# Patient Record
Sex: Female | Born: 1950 | Race: Black or African American | Hispanic: No | Marital: Married | Smoking: Former smoker
Health system: Southern US, Community
[De-identification: ages and names within clinical notes are randomized; demographics above are authoritative.]

## PROBLEM LIST (undated history)

## (undated) DIAGNOSIS — C50919 Malignant neoplasm of unspecified site of unspecified female breast: Secondary | ICD-10-CM

## (undated) DIAGNOSIS — J449 Chronic obstructive pulmonary disease, unspecified: Secondary | ICD-10-CM

## (undated) DIAGNOSIS — S46009A Unspecified injury of muscle(s) and tendon(s) of the rotator cuff of unspecified shoulder, initial encounter: Secondary | ICD-10-CM

## (undated) DIAGNOSIS — G43909 Migraine, unspecified, not intractable, without status migrainosus: Secondary | ICD-10-CM

## (undated) DIAGNOSIS — R600 Localized edema: Secondary | ICD-10-CM

## (undated) DIAGNOSIS — I10 Essential (primary) hypertension: Secondary | ICD-10-CM

## (undated) DIAGNOSIS — M1990 Unspecified osteoarthritis, unspecified site: Secondary | ICD-10-CM

## (undated) DIAGNOSIS — E785 Hyperlipidemia, unspecified: Secondary | ICD-10-CM

## (undated) DIAGNOSIS — G2581 Restless legs syndrome: Secondary | ICD-10-CM

## (undated) DIAGNOSIS — D649 Anemia, unspecified: Secondary | ICD-10-CM

## (undated) DIAGNOSIS — R0600 Dyspnea, unspecified: Secondary | ICD-10-CM

## (undated) HISTORY — PX: ABDOMINAL HYSTERECTOMY: SHX81

## (undated) HISTORY — PX: PARTIAL HYSTERECTOMY: SHX80

## (undated) HISTORY — PX: BREAST LUMPECTOMY: SHX2

---

## 1988-06-05 DIAGNOSIS — C50919 Malignant neoplasm of unspecified site of unspecified female breast: Secondary | ICD-10-CM

## 1988-06-05 DIAGNOSIS — Z923 Personal history of irradiation: Secondary | ICD-10-CM

## 1988-06-05 HISTORY — DX: Personal history of irradiation: Z92.3

## 1988-06-05 HISTORY — PX: BREAST BIOPSY: SHX20

## 1988-06-05 HISTORY — PX: MEDIAL PARTIAL KNEE REPLACEMENT: SHX5965

## 1988-06-05 HISTORY — DX: Malignant neoplasm of unspecified site of unspecified female breast: C50.919

## 2004-04-26 ENCOUNTER — Emergency Department: Payer: Self-pay | Admitting: Emergency Medicine

## 2006-10-22 ENCOUNTER — Emergency Department: Payer: Self-pay | Admitting: Emergency Medicine

## 2009-04-04 ENCOUNTER — Emergency Department: Payer: Self-pay | Admitting: Emergency Medicine

## 2009-04-07 ENCOUNTER — Emergency Department: Payer: Self-pay | Admitting: Emergency Medicine

## 2013-07-14 ENCOUNTER — Ambulatory Visit: Payer: Self-pay | Admitting: Gastroenterology

## 2013-07-15 LAB — PATHOLOGY REPORT

## 2013-08-01 ENCOUNTER — Emergency Department: Payer: Self-pay | Admitting: Emergency Medicine

## 2013-09-14 ENCOUNTER — Emergency Department: Payer: Self-pay | Admitting: Emergency Medicine

## 2013-10-21 ENCOUNTER — Ambulatory Visit: Payer: Self-pay | Admitting: Family Medicine

## 2015-02-03 ENCOUNTER — Emergency Department
Admission: EM | Admit: 2015-02-03 | Discharge: 2015-02-03 | Disposition: A | Discharge status: Home / Self Care | Payer: PPO | Attending: Emergency Medicine | Admitting: Emergency Medicine

## 2015-02-03 ENCOUNTER — Encounter: Payer: Self-pay | Admitting: Emergency Medicine

## 2015-02-03 ENCOUNTER — Emergency Department: Payer: PPO

## 2015-02-03 DIAGNOSIS — R519 Headache, unspecified: Secondary | ICD-10-CM

## 2015-02-03 DIAGNOSIS — Z720 Tobacco use: Secondary | ICD-10-CM | POA: Insufficient documentation

## 2015-02-03 DIAGNOSIS — H538 Other visual disturbances: Secondary | ICD-10-CM | POA: Diagnosis not present

## 2015-02-03 DIAGNOSIS — R51 Headache: Secondary | ICD-10-CM | POA: Diagnosis present

## 2015-02-03 DIAGNOSIS — Z91040 Latex allergy status: Secondary | ICD-10-CM | POA: Insufficient documentation

## 2015-02-03 DIAGNOSIS — I10 Essential (primary) hypertension: Secondary | ICD-10-CM | POA: Diagnosis not present

## 2015-02-03 HISTORY — DX: Migraine, unspecified, not intractable, without status migrainosus: G43.909

## 2015-02-03 HISTORY — DX: Essential (primary) hypertension: I10

## 2015-02-03 HISTORY — DX: Malignant neoplasm of unspecified site of unspecified female breast: C50.919

## 2015-02-03 HISTORY — DX: Chronic obstructive pulmonary disease, unspecified: J44.9

## 2015-02-03 LAB — COMPREHENSIVE METABOLIC PANEL
ALBUMIN: 3.6 g/dL (ref 3.5–5.0)
ALT: 13 U/L — ABNORMAL LOW (ref 14–54)
AST: 20 U/L (ref 15–41)
Alkaline Phosphatase: 33 U/L — ABNORMAL LOW (ref 38–126)
Anion gap: 6 (ref 5–15)
BUN: 20 mg/dL (ref 6–20)
CHLORIDE: 108 mmol/L (ref 101–111)
CO2: 27 mmol/L (ref 22–32)
Calcium: 8.9 mg/dL (ref 8.9–10.3)
Creatinine, Ser: 0.9 mg/dL (ref 0.44–1.00)
GFR calc Af Amer: >60 mL/min (ref >60)
GLUCOSE: 95 mg/dL (ref 65–99)
POTASSIUM: 3.7 mmol/L (ref 3.5–5.1)
SODIUM: 141 mmol/L (ref 135–145)
Total Bilirubin: 0.2 mg/dL — ABNORMAL LOW (ref 0.3–1.2)
Total Protein: 6 g/dL — ABNORMAL LOW (ref 6.5–8.1)

## 2015-02-03 LAB — CBC
HCT: 36.4 % (ref 35.0–47.0)
Hemoglobin: 11.7 g/dL — ABNORMAL LOW (ref 12.0–16.0)
MCH: 26.4 pg (ref 26.0–34.0)
MCHC: 32 g/dL (ref 32.0–36.0)
MCV: 82.5 fL (ref 80.0–100.0)
PLATELETS: 252 10*3/uL (ref 150–440)
RBC: 4.41 MIL/uL (ref 3.80–5.20)
RDW: 15.4 % — AB (ref 11.5–14.5)
WBC: 4.9 10*3/uL (ref 3.6–11.0)

## 2015-02-03 LAB — TROPONIN I

## 2015-02-03 MED ORDER — OXYCODONE-ACETAMINOPHEN 5-325 MG PO TABS: 1.0000 | ORAL_TABLET | ORAL | Status: DC | PRN

## 2015-02-03 MED ORDER — ONDANSETRON HCL 4 MG/2ML IJ SOLN
4.0000 mg | Freq: Once | INTRAMUSCULAR | Status: AC
  Administered 2015-02-03: 4 mg via INTRAVENOUS
  Filled 2015-02-03: qty 2

## 2015-02-03 MED ORDER — IOHEXOL 350 MG/ML SOLN
100.0000 mL | Freq: Once | INTRAVENOUS | Status: AC | PRN
  Administered 2015-02-03: 100 mL via INTRAVENOUS

## 2015-02-03 MED ORDER — KETOROLAC TROMETHAMINE 30 MG/ML IJ SOLN
30.0000 mg | Freq: Once | INTRAMUSCULAR | Status: AC
  Administered 2015-02-03: 30 mg via INTRAVENOUS
  Filled 2015-02-03: qty 1

## 2015-02-03 NOTE — ED Notes (Signed)
Patient transported to CT 

## 2015-02-03 NOTE — ED Notes (Signed)

## 2015-02-03 NOTE — ED Provider Notes (Signed)
[facility] Emergency Department Provider Note  ____________________________________________  Time seen: 3:30 AM  I have reviewed the triage vital signs and the nursing notes.   HISTORY  Chief Complaint Migraine      HPI Hailey Farrell is a 64 y.o. female presents with a right-sided headache 3 days current pain score 10 out of 10. Patient states she took tramadol and BC powders at home with slight relief but not complete relief. Patient admits to previous episodes of the same. Patient admits to photosensitivity dizziness with standing. Patient noted to be markedly hypertensive by EMS with a blood pressure 210/109.     Past Medical History  Diagnosis Date  . COPD (chronic obstructive pulmonary disease)   . Hypertension   . Migraines   . Breast cancer     There are no active problems to display for this patient.   Past Surgical History  Procedure Laterality Date  . Partial hysterectomy    . Medial partial knee replacement      No current outpatient prescriptions on file.  Allergies Latex  No family history on file.  Social History Social History  Substance Use Topics  . Smoking status: Current Some Day Smoker -- 0.00 packs/day    Types: Cigarettes  . Smokeless tobacco: None  . Alcohol Use: No    Review of Systems  Constitutional: Negative for fever. Eyes: Negative for visual changes. ENT: Negative for sore throat. Cardiovascular: Negative for chest pain. Respiratory: Negative for shortness of breath. Gastrointestinal: Negative for abdominal pain, vomiting and diarrhea. Genitourinary: Negative for dysuria. Musculoskeletal: Negative for back pain. Skin: Negative for rash. Neurological: Positive for headaches, negative for focal weakness or numbness.   10-point ROS otherwise negative.  ____________________________________________   PHYSICAL EXAM:  VITAL SIGNS: ED Triage Vitals  Enc Vitals Group     BP 02/03/15 0315  161/85 mmHg     Pulse Rate 02/03/15 0315 63     Resp 02/03/15 0315 18     Temp 02/03/15 0315 98.2 F (36.8 C)     Temp Source 02/03/15 0315 Oral     SpO2 02/03/15 0315 100 %     Weight 02/03/15 0315 184 lb (83.462 kg)     Height 02/03/15 0315 5\' 3"  (1.6 m)     Head Cir --      Peak Flow --      Pain Score 02/03/15 0316 6     Pain Loc --      Pain Edu? --      Excl. in [place]? --      Constitutional: Alert and oriented. Well appearing and in no distress. Eyes: Conjunctivae are normal. PERRL. Normal extraocular movements. ENT   Head: Normocephalic and atraumatic.   Nose: No congestion/rhinnorhea.   Mouth/Throat: Mucous membranes are moist.   Neck: No stridor. Hematological/Lymphatic/Immunilogical: No cervical lymphadenopathy. Cardiovascular: Normal rate, regular rhythm. Normal and symmetric distal pulses are present in all extremities. No murmurs, rubs, or gallops. Respiratory: Normal respiratory effort without tachypnea nor retractions. Breath sounds are clear and equal bilaterally. No wheezes/rales/rhonchi. Gastrointestinal: Soft and nontender. No distention. There is no CVA tenderness. Genitourinary: deferred Musculoskeletal: Nontender with normal range of motion in all extremities. No joint effusions.  No lower extremity tenderness nor edema. Neurologic:  Normal speech and language. No gross focal neurologic deficits are appreciated. Speech is normal.  Skin:  Skin is warm, dry and intact. No rash noted. Psychiatric: Mood and affect are normal. Speech and behavior are normal. Patient  exhibits appropriate insight and judgment.  ____________________________________________    LABS (pertinent positives/negatives)  Labs Reviewed  CBC - Abnormal; Notable for the following:    Hemoglobin 11.7 (*)    RDW 15.4 (*)    All other components within normal limits  COMPREHENSIVE METABOLIC PANEL - Abnormal; Notable for the following:    Total Protein 6.0 (*)    ALT 13 (*)     Alkaline Phosphatase 33 (*)    Total Bilirubin 0.2 (*)    All other components within normal limits  TROPONIN I     ____________________________________________   EKG  ED ECG REPORT I, Lian Tanori, [place], the attending physician, personally viewed and interpreted this ECG.   Date: 02/03/2015  EKG Time: 4:12AM  Rate: 73  Rhythm: Normal sinus rhythm  Axis: None  Intervals: Normal  ST&T Change: none  ____________________________________________    RADIOLOGY          CT Angio Head W/Cm &/Or Wo Cm (Final result) Result time: 02/03/15 05:51:09   Final result by Rad Results In Interface (02/03/15 05:51:09)   Narrative:   CLINICAL DATA: RIGHT-sided headache for 3 days, vomiting. Photophobia, dizziness, shortness of breath. History of hypertension, migraines and breast cancer.  EXAM: CT ANGIOGRAPHY HEAD  TECHNIQUE: Multidetector CT imaging of the head was performed using the standard protocol during bolus administration of intravenous contrast. Multiplanar CT image reconstructions and MIPs were obtained to evaluate the vascular anatomy.  CONTRAST: 186mL OMNIPAQUE IOHEXOL 350 MG/ML SOLN  COMPARISON: None.  FINDINGS: CT HEAD  The ventricles and sulci are normal for age. No intraparenchymal hemorrhage, mass effect nor midline shift. Patchy supratentorial white matter hypodensities are within normal range for patient's age and though non-specific suggest sequelae of chronic small vessel ischemic disease. No acute large vascular territory infarcts. No abnormal intracranial enhancement.  No abnormal extra-axial fluid collections. Basal cisterns are patent. Moderate calcific atherosclerosis of the carotid siphons.  No skull fracture. The included ocular globes and orbital contents are non-suspicious. Small LEFT sphenoid mucosal retention cyst without paranasal sinus air-fluid levels. The mastoid air cells are well aerated.  CTA HEAD  Anterior circulation:  Normal appearance of the cervical internal carotid arteries, petrous, cavernous and supra clinoid internal carotid arteries. Widely patent anterior communicating artery. Normal appearance of the anterior and middle cerebral arteries.  Posterior circulation: Codominant vertebral arteries with normal appearance of the vertebral arteries, vertebrobasilar junction and basilar artery, as well as main branch vessels. Small bilateral posterior communicating arteries are present. Normal appearance of the posterior cerebral arteries.  No large vessel occlusion, hemodynamically significant stenosis, dissection, luminal irregularity, contrast extravasation or aneurysm within the anterior nor posterior circulation.  IMPRESSION: CT HEAD: No acute intracranial process ; normal noncontrast CT head for age.  CTA HEAD: No large vessel occlusion or high-grade stenosis ; complete circle of Willis.   Electronically Signed By: Elon Alas M.D.       INITIAL IMPRESSION / ASSESSMENT AND PLAN / ED COURSE  Pertinent labs & imaging results that were available during my care of the patient were reviewed by me and considered in my medical decision making (see chart for details).  Patient received IV Toradol and Zofran with complete resolution of headache.  ____________________________________________   FINAL CLINICAL IMPRESSION(S) / ED DIAGNOSES  Final diagnoses:  Blurred vision  Hypertension  Headache      Gregor Hams, MD 02/03/15 [phone]

## 2015-02-03 NOTE — Discharge Instructions (Signed)

## 2015-02-03 NOTE — ED Notes (Signed)
Patient back from CT.

## 2015-02-03 NOTE — ED Notes (Addendum)
Patient presents to ED with c/o right sided headache x 3 days. Reports vomiting Sunday. Patient reports taking Tramadol yesterday for headache and BC powder today, reports slight relief. +photosensitivity, dizziness when standing, and short of breath when having headaches. Per EMS patient blood pressure: left arm: 210/109, right arm: 175/102. Patient takes Lisinopril for HTN, reports took her Lisinopril yesterday. Denies chest pain, fevers, abdominal pain. Patient alert and oriented x 4, respirations even and unlabored, speaking in complete sentences. Skin warm and dry.

## 2015-03-17 ENCOUNTER — Encounter: Payer: Self-pay | Admitting: Emergency Medicine

## 2015-03-17 ENCOUNTER — Emergency Department
Admission: EM | Admit: 2015-03-17 | Discharge: 2015-03-17 | Disposition: A | Discharge status: Home / Self Care | Payer: PPO | Attending: Emergency Medicine | Admitting: Emergency Medicine

## 2015-03-17 DIAGNOSIS — I10 Essential (primary) hypertension: Secondary | ICD-10-CM | POA: Insufficient documentation

## 2015-03-17 DIAGNOSIS — Z91040 Latex allergy status: Secondary | ICD-10-CM | POA: Diagnosis not present

## 2015-03-17 DIAGNOSIS — Z720 Tobacco use: Secondary | ICD-10-CM | POA: Diagnosis not present

## 2015-03-17 DIAGNOSIS — G8929 Other chronic pain: Secondary | ICD-10-CM | POA: Insufficient documentation

## 2015-03-17 DIAGNOSIS — F419 Anxiety disorder, unspecified: Secondary | ICD-10-CM | POA: Insufficient documentation

## 2015-03-17 DIAGNOSIS — G43901 Migraine, unspecified, not intractable, with status migrainosus: Secondary | ICD-10-CM | POA: Diagnosis not present

## 2015-03-17 DIAGNOSIS — Z79899 Other long term (current) drug therapy: Secondary | ICD-10-CM | POA: Diagnosis not present

## 2015-03-17 DIAGNOSIS — R51 Headache: Secondary | ICD-10-CM | POA: Diagnosis present

## 2015-03-17 MED ORDER — DIVALPROEX SODIUM 250 MG PO DR TAB
250.0000 mg | DELAYED_RELEASE_TABLET | Freq: Once | ORAL | Status: AC
  Administered 2015-03-17: 250 mg via ORAL
  Filled 2015-03-17: qty 1

## 2015-03-17 MED ORDER — METOCLOPRAMIDE HCL 10 MG PO TABS: 10.0000 mg | ORAL_TABLET | Freq: Three times a day (TID) | ORAL | Status: DC

## 2015-03-17 MED ORDER — METOCLOPRAMIDE HCL 10 MG PO TABS
10.0000 mg | ORAL_TABLET | Freq: Once | ORAL | Status: AC
  Administered 2015-03-17: 10 mg via ORAL
  Filled 2015-03-17: qty 1

## 2015-03-17 MED ORDER — PREDNISONE 20 MG PO TABS
60.0000 mg | ORAL_TABLET | Freq: Once | ORAL | Status: AC
  Administered 2015-03-17: 60 mg via ORAL
  Filled 2015-03-17: qty 3

## 2015-03-17 MED ORDER — PREDNISONE 20 MG PO TABS: 40.0000 mg | ORAL_TABLET | Freq: Every day | ORAL | Status: DC

## 2015-03-17 MED ORDER — DIPHENHYDRAMINE HCL 25 MG PO CAPS: 50.0000 mg | ORAL_CAPSULE | Freq: Four times a day (QID) | ORAL | Status: DC | PRN

## 2015-03-17 MED ORDER — KETOROLAC TROMETHAMINE 60 MG/2ML IM SOLN
60.0000 mg | Freq: Once | INTRAMUSCULAR | Status: AC
Start: 2015-03-17 — End: 2015-03-17
  Administered 2015-03-17: 60 mg via INTRAMUSCULAR
  Filled 2015-03-17: qty 2

## 2015-03-17 MED ORDER — DIPHENHYDRAMINE HCL 25 MG PO CAPS
50.0000 mg | ORAL_CAPSULE | Freq: Once | ORAL | Status: AC
  Administered 2015-03-17: 50 mg via ORAL
  Filled 2015-03-17: qty 2

## 2015-03-17 MED ORDER — NAPROXEN 500 MG PO TABS: 500.0000 mg | ORAL_TABLET | Freq: Two times a day (BID) | ORAL | Status: DC

## 2015-03-17 NOTE — ED Provider Notes (Signed)
[facility] Emergency Department Provider Note  ____________________________________________  Time seen: 10:40 AM  I have reviewed the triage vital signs and the nursing notes.   HISTORY  Chief Complaint Headache    HPI Hailey Farrell is a 64 y.o. female who complains of a severe diffuse headache for the past 2 days. It is off and on occurring a few minutes at a time and occurring as often as every hour. It's severe in intensity. No vision changes numbness tingling or weakness. She does state that this feels like same headache pattern that she has had chronically. Her primary care doctor has given her Imitrex which she has taken without relief. No trauma. No fevers chills or neck pain.     Past Medical History  Diagnosis Date  . COPD (chronic obstructive pulmonary disease) ([place])   . Hypertension   . Migraines   . Breast cancer ([place])      There are no active problems to display for this patient.    Past Surgical History  Procedure Laterality Date  . Partial hysterectomy    . Medial partial knee replacement       Current Outpatient Rx  Name  Route  Sig  Dispense  Refill  . SUMAtriptan (IMITREX) 100 MG tablet   Oral   Take 100 mg by mouth every 2 (two) hours as needed for migraine. May repeat in 2 hours if headache persists or recurs.         . topiramate (TOPAMAX) 25 MG tablet   Oral   Take 25 mg by mouth at bedtime.         . diphenhydrAMINE (BENADRYL) 25 mg capsule   Oral   Take 2 capsules (50 mg total) by mouth every 6 (six) hours as needed.   60 capsule   0   . metoCLOPramide (REGLAN) 10 MG tablet   Oral   Take 1 tablet (10 mg total) by mouth 4 (four) times daily -  before meals and at bedtime.   60 tablet   0   . naproxen (NAPROSYN) 500 MG tablet   Oral   Take 1 tablet (500 mg total) by mouth 2 (two) times daily with a meal.   10 tablet   0   . oxyCODONE-acetaminophen (ROXICET) 5-325 MG per tablet   Oral   Take 1  tablet by mouth every 4 (four) hours as needed for severe pain. Patient not taking: Reported on 03/17/2015   20 tablet   0   . predniSONE (DELTASONE) 20 MG tablet   Oral   Take 2 tablets (40 mg total) by mouth daily.   8 tablet   0      Allergies Latex   History reviewed. No pertinent family history.  Social History Social History  Substance Use Topics  . Smoking status: Current Some Day Smoker -- 0.00 packs/day    Types: Cigarettes  . Smokeless tobacco: None  . Alcohol Use: No    Review of Systems  Constitutional:   No fever or chills. No weight changes Eyes:   No blurry vision or double vision.  ENT:   No sore throat. Cardiovascular:   No chest pain. Respiratory:   No dyspnea or cough. Gastrointestinal:   Negative for abdominal pain, vomiting and diarrhea.  No BRBPR or melena. Genitourinary:   Negative for dysuria, urinary retention, bloody urine, or difficulty urinating. Musculoskeletal:   Negative for back pain. No joint swelling or pain. Skin:   Negative for  rash. Neurological:   Headache as above without weakness or paresthesia. Psychiatric:  No anxiety or depression.   Endocrine:  No hot/cold intolerance, changes in energy, or sleep difficulty.  10-point ROS otherwise negative.  ____________________________________________   PHYSICAL EXAM:  VITAL SIGNS: ED Triage Vitals  Enc Vitals Group     BP 03/17/15 0909 137/92 mmHg     Pulse Rate 03/17/15 0909 105     Resp 03/17/15 0909 20     Temp 03/17/15 0909 97.6 F (36.4 C)     Temp Source 03/17/15 0909 Oral     SpO2 03/17/15 0909 100 %     Weight 03/17/15 0909 184 lb (83.462 kg)     Height 03/17/15 0909 5\' 4"  (1.626 m)     Head Cir --      Peak Flow --      Pain Score 03/17/15 0910 10     Pain Loc --      Pain Edu? --      Excl. in [place]? --      Constitutional:   Alert and oriented. Moderate distress due to pain, anxious. Eyes:   No scleral icterus. No conjunctival pallor. PERRL. EOMI.  Positive photophobia ENT   Head:   Normocephalic and atraumatic.   Nose:   No congestion/rhinnorhea. No septal hematoma   Mouth/Throat:   MMM, no pharyngeal erythema. No peritonsillar mass. No uvula shift.   Neck:   No stridor. No SubQ emphysema. No meningismus. Hematological/Lymphatic/Immunilogical:   No cervical lymphadenopathy. Cardiovascular:   RRR. Normal and symmetric distal pulses are present in all extremities. No murmurs, rubs, or gallops. Respiratory:   Normal respiratory effort without tachypnea nor retractions. Breath sounds are clear and equal bilaterally. No wheezes/rales/rhonchi. Gastrointestinal:   Soft and nontender. No distention. There is no CVA tenderness.  No rebound, rigidity, or guarding. Genitourinary:   deferred Musculoskeletal:   Nontender with normal range of motion in all extremities. No joint effusions.  No lower extremity tenderness.  No edema. Neurologic:   Normal speech and language.  CN 2-10 normal. Motor grossly intact.   Normal gait. No gross focal neurologic deficits are appreciated.  Skin:    Skin is warm, dry and intact. No rash noted.  No petechiae, purpura, or bullae. Psychiatric:   Mood and affect are normal. Speech and behavior are normal. Patient exhibits appropriate insight and judgment.  ____________________________________________    LABS (pertinent positives/negatives) (all labs ordered are listed, but only abnormal results are displayed) Labs Reviewed - No data to display ____________________________________________   EKG    ____________________________________________    RADIOLOGY    ____________________________________________   PROCEDURES   ____________________________________________   INITIAL IMPRESSION / ASSESSMENT AND PLAN / ED COURSE  Pertinent labs & imaging results that were available during my care of the patient were reviewed by me and considered in my medical decision making (see chart for  details).  Patient presents with recurrence of previous headache pattern consistent with migraine headache. We'll give Benadryl Reglan Toradol and steroids for symptomatically relief and keep the patient on some of these medicines over the next few days to resolve this headache episode. Due to the recurrence and severity of her headaches, I recommended she follow up with neurology for further evaluation. She does also take Topamax and I encouraged her to continue taking that. No evidence of intracranial hemorrhage or trauma. Low suspicion for meningitis or encephalitis, glaucoma, temporal arteritis retinal detachment. No evidence of optic neuritis or multiple sclerosis.  ____________________________________________   FINAL CLINICAL IMPRESSION(S) / ED DIAGNOSES  Final diagnoses:  Migraine with status migrainosus, not intractable, unspecified migraine type      Carrie Mew, MD 03/17/15 1126

## 2015-03-17 NOTE — Discharge Instructions (Signed)
Recurrent Migraine Headache A migraine headache is an intense, throbbing pain on one or both sides of your head. Recurrent migraines keep coming back. A migraine can last for 30 minutes to several hours. CAUSES  The exact cause of a migraine headache is not always known. However, a migraine may be caused when nerves in the brain become irritated and release chemicals that cause inflammation. This causes pain. Certain things may also trigger migraines, such as:   Alcohol.  Smoking.  Stress.  Menstruation.  Aged cheeses.  Foods or drinks that contain nitrates, glutamate, aspartame, or tyramine.  Lack of sleep.  Chocolate.  Caffeine.  Hunger.  Physical exertion.  Fatigue.  Medicines used to treat chest pain (nitroglycerine), birth control pills, estrogen, and some blood pressure medicines. SYMPTOMS   Pain on one or both sides of your head.  Pulsating or throbbing pain.  Severe pain that prevents daily activities.  Pain that is aggravated by any physical activity.  Nausea, vomiting, or both.  Dizziness.  Pain with exposure to bright lights, loud noises, or activity.  General sensitivity to bright lights, loud noises, or smells. Before you get a migraine, you may get warning signs that a migraine is coming (aura). An aura may include:  Seeing flashing lights.  Seeing bright spots, halos, or zigzag lines.  Having tunnel vision or blurred vision.  Having feelings of numbness or tingling.  Having trouble talking.  Having muscle weakness. DIAGNOSIS  A recurrent migraine headache is often diagnosed based on:  Symptoms.  Physical examination.  A CT scan or MRI of your head. These imaging tests cannot diagnose migraines but can help rule out other causes of headaches.  TREATMENT  Medicines may be given for pain and nausea. Medicines can also be given to help prevent recurrent migraines. HOME CARE INSTRUCTIONS  Only take over-the-counter or prescription  medicines for pain or discomfort as directed by your health care provider. The use of long-term narcotics is not recommended.  Lie down in a dark, quiet room when you have a migraine.  Keep a journal to find out what may trigger your migraine headaches. For example, write down:  What you eat and drink.  How much sleep you get.  Any change to your diet or medicines.  Limit alcohol consumption.  Quit smoking if you smoke.  Get 7-9 hours of sleep, or as recommended by your health care provider.  Limit stress.  Keep lights dim if bright lights bother you and make your migraines worse. SEEK MEDICAL CARE IF:   You do not get relief from the medicines given to you.  You have a recurrence of pain.  You have a fever. SEEK IMMEDIATE MEDICAL CARE IF:  Your migraine becomes severe.  You have a stiff neck.  You have loss of vision.  You have muscular weakness or loss of muscle control.  You start losing your balance or have trouble walking.  You feel faint or pass out.  You have severe symptoms that are different from your first symptoms. MAKE SURE YOU:   Understand these instructions.  Will watch your condition.  Will get help right away if you are not doing well or get worse.   This information is not intended to replace advice given to you by your health care provider. Make sure you discuss any questions you have with your health care provider.   Document Released: 02/14/2001 Document Revised: 06/12/2014 Document Reviewed: 01/27/2013 Elsevier Interactive Patient Education 2016 [employer]  

## 2015-03-17 NOTE — ED Notes (Signed)
Pt to ed with c/o headache x 2 days.  Pt states was seen here about 1 month ago for same and then followed up with her PMD.  Pt states she was given imitrex but her insurance will only pay for 9 per month.  Pt tearful at this time.

## 2015-07-22 DIAGNOSIS — G43719 Chronic migraine without aura, intractable, without status migrainosus: Secondary | ICD-10-CM | POA: Insufficient documentation

## 2015-07-22 DIAGNOSIS — E669 Obesity, unspecified: Secondary | ICD-10-CM | POA: Insufficient documentation

## 2015-07-22 DIAGNOSIS — I10 Essential (primary) hypertension: Secondary | ICD-10-CM | POA: Insufficient documentation

## 2016-01-10 ENCOUNTER — Other Ambulatory Visit: Payer: Self-pay | Admitting: Orthopedic Surgery

## 2016-01-10 DIAGNOSIS — M25512 Pain in left shoulder: Secondary | ICD-10-CM

## 2016-01-16 ENCOUNTER — Encounter: Payer: Self-pay | Admitting: Emergency Medicine

## 2016-01-16 ENCOUNTER — Emergency Department
Admission: EM | Admit: 2016-01-16 | Discharge: 2016-01-16 | Disposition: A | Discharge status: Home / Self Care | Payer: PPO | Attending: Emergency Medicine | Admitting: Emergency Medicine

## 2016-01-16 DIAGNOSIS — H1132 Conjunctival hemorrhage, left eye: Secondary | ICD-10-CM

## 2016-01-16 DIAGNOSIS — J449 Chronic obstructive pulmonary disease, unspecified: Secondary | ICD-10-CM | POA: Diagnosis not present

## 2016-01-16 DIAGNOSIS — I10 Essential (primary) hypertension: Secondary | ICD-10-CM | POA: Insufficient documentation

## 2016-01-16 DIAGNOSIS — F17210 Nicotine dependence, cigarettes, uncomplicated: Secondary | ICD-10-CM | POA: Diagnosis not present

## 2016-01-16 DIAGNOSIS — Z853 Personal history of malignant neoplasm of breast: Secondary | ICD-10-CM | POA: Diagnosis not present

## 2016-01-16 MED ORDER — FLUORESCEIN SODIUM 1 MG OP STRP: 1.0000 | ORAL_STRIP | Freq: Once | OPHTHALMIC | Status: DC

## 2016-01-16 NOTE — ED Notes (Signed)

## 2016-01-16 NOTE — ED Provider Notes (Signed)
[facility] Emergency Department Provider Note   ____________________________________________    I have reviewed the triage vital signs and the nursing notes.   HISTORY  Chief Complaint Eye Problem (left)     HPI Hailey Farrell is a 65 y.o. female who presents with blood around her left eye. Patient reports she woke up with a red eye. She denies pain. No decreased vision. No fevers or chills. No nausea or vomiting. No injury to the area. No recent cough or vomiting   Past Medical History:  Diagnosis Date  . Breast cancer ([place])   . COPD (chronic obstructive pulmonary disease) ([place])   . Hypertension   . Migraines     There are no active problems to display for this patient.   Past Surgical History:  Procedure Laterality Date  . MEDIAL PARTIAL KNEE REPLACEMENT    . PARTIAL HYSTERECTOMY      Prior to Admission medications   Medication Sig Start Date End Date Taking? Authorizing Provider  diphenhydrAMINE (BENADRYL) 25 mg capsule Take 2 capsules (50 mg total) by mouth every 6 (six) hours as needed. 03/17/15   Carrie Mew, MD  metoCLOPramide (REGLAN) 10 MG tablet Take 1 tablet (10 mg total) by mouth 4 (four) times daily -  before meals and at bedtime. 03/17/15   Carrie Mew, MD  naproxen (NAPROSYN) 500 MG tablet Take 1 tablet (500 mg total) by mouth 2 (two) times daily with a meal. 03/17/15   Carrie Mew, MD  oxyCODONE-acetaminophen (ROXICET) 5-325 MG per tablet Take 1 tablet by mouth every 4 (four) hours as needed for severe pain. Patient not taking: Reported on 03/17/2015 02/03/15   Gregor Hams, MD  predniSONE (DELTASONE) 20 MG tablet Take 2 tablets (40 mg total) by mouth daily. 03/17/15   Carrie Mew, MD  SUMAtriptan (IMITREX) 100 MG tablet Take 100 mg by mouth every 2 (two) hours as needed for migraine. May repeat in 2 hours if headache persists or recurs.    Historical Provider, MD  topiramate (TOPAMAX) 25 MG tablet  Take 25 mg by mouth at bedtime.    Historical Provider, MD     Allergies Latex  History reviewed. No pertinent family history.  Social History Social History  Substance Use Topics  . Smoking status: Current Some Day Smoker    Packs/day: 0.00    Types: Cigarettes  . Smokeless tobacco: Never Used  . Alcohol use No    Review of Systems  Constitutional: No fever/chills  ENT: Cough or runny nose.   Gastrointestinal: No nausea, no vomiting.     Skin: Negative for rash. Neurological: Negative for headaches     ____________________________________________   PHYSICAL EXAM:  VITAL SIGNS: ED Triage Vitals  Enc Vitals Group     BP 01/16/16 1326 125/79     Pulse Rate 01/16/16 1326 82     Resp 01/16/16 1326 18     Temp 01/16/16 1326 98.7 F (37.1 C)     Temp Source 01/16/16 1326 Oral     SpO2 01/16/16 1326 97 %     Weight 01/16/16 1326 205 lb (93 kg)     Height 01/16/16 1326 5\' 3"  (1.6 m)     Head Circumference --      Peak Flow --      Pain Score 01/16/16 1327 4     Pain Loc --      Pain Edu? --      Excl. in [place]? --  Constitutional: Alert and oriented. No acute distress. Pleasant and interactive Eyes: Subconjunctival hemorrhage involving primarily the inferior portion of the left eye, PERRLA, EOMI, no foreign bodies, no hyphema Head: Atraumatic. Nose: No congestion/rhinnorhea. Mouth/Throat: Mucous membranes are moist.   Cardiovascular: Normal rate, regular rhythm.  Respiratory: Normal respiratory effort.  No retractions. Genitourinary: deferred Musculoskeletal: No lower extremity tenderness nor edema.   Neurologic:  Normal speech and language. No gross focal neurologic deficits are appreciated.   Skin:  Skin is warm, dry and intact. No rash noted.   ____________________________________________   LABS (all labs ordered are listed, but only abnormal results are displayed)  Labs Reviewed - No data to  display ____________________________________________  EKG   ____________________________________________  RADIOLOGY  None ____________________________________________   PROCEDURES  Procedure(s) performed: No    Critical Care performed: No ____________________________________________   INITIAL IMPRESSION / ASSESSMENT AND PLAN / ED COURSE  Pertinent labs & imaging results that were available during my care of the patient were reviewed by me and considered in my medical decision making (see chart for details).  Patient presents with  conjunctival hemorrhage which she apparently awoke with. No change in vision. Not consistent with glaucoma. No evidence of infection. No photophobia. PERRLA. I'll have her follow up with ophthalmology   ____________________________________________   FINAL CLINICAL IMPRESSION(S) / ED DIAGNOSES  Final diagnoses:  Subconjunctival hemorrhage of left eye      NEW MEDICATIONS STARTED DURING THIS VISIT:  New Prescriptions   No medications on file     Note:  This document was prepared using Dragon voice recognition software and may include unintentional dictation errors.    Lavonia Drafts, MD 01/16/16 [phone]

## 2016-01-16 NOTE — ED Triage Notes (Addendum)
Pt presents to ED c/o possible L subconjunctival hemorrhage with 4/10 pain noticed when pt woke up this morning around 0430. Pt states it feels like something is in her eye. States she tried visine at home without relief.

## 2016-01-20 ENCOUNTER — Ambulatory Visit
Admission: RE | Admit: 2016-01-20 | Discharge: 2016-01-20 | Disposition: A | Discharge status: Home / Self Care | Payer: Medicare HMO | Source: Ambulatory Visit | Attending: Orthopedic Surgery | Admitting: Orthopedic Surgery

## 2016-01-20 DIAGNOSIS — S46912A Strain of unspecified muscle, fascia and tendon at shoulder and upper arm level, left arm, initial encounter: Secondary | ICD-10-CM | POA: Insufficient documentation

## 2016-01-20 DIAGNOSIS — X58XXXA Exposure to other specified factors, initial encounter: Secondary | ICD-10-CM | POA: Insufficient documentation

## 2016-01-20 DIAGNOSIS — M25512 Pain in left shoulder: Secondary | ICD-10-CM | POA: Diagnosis not present

## 2016-01-20 DIAGNOSIS — R936 Abnormal findings on diagnostic imaging of limbs: Secondary | ICD-10-CM | POA: Insufficient documentation

## 2016-01-20 DIAGNOSIS — M779 Enthesopathy, unspecified: Secondary | ICD-10-CM | POA: Insufficient documentation

## 2016-01-24 ENCOUNTER — Other Ambulatory Visit: Payer: Self-pay | Admitting: Orthopedic Surgery

## 2016-02-02 ENCOUNTER — Encounter
Admission: RE | Admit: 2016-02-02 | Discharge: 2016-02-02 | Disposition: A | Discharge status: Home / Self Care | Payer: Medicare HMO | Source: Ambulatory Visit | Attending: Orthopedic Surgery | Admitting: Orthopedic Surgery

## 2016-02-02 DIAGNOSIS — I10 Essential (primary) hypertension: Secondary | ICD-10-CM | POA: Insufficient documentation

## 2016-02-02 DIAGNOSIS — Z01810 Encounter for preprocedural cardiovascular examination: Secondary | ICD-10-CM | POA: Diagnosis present

## 2016-02-02 DIAGNOSIS — Z01812 Encounter for preprocedural laboratory examination: Secondary | ICD-10-CM | POA: Diagnosis not present

## 2016-02-02 HISTORY — DX: Restless legs syndrome: G25.81

## 2016-02-02 HISTORY — DX: Hyperlipidemia, unspecified: E78.5

## 2016-02-02 HISTORY — DX: Unspecified injury of muscle(s) and tendon(s) of the rotator cuff of unspecified shoulder, initial encounter: S46.009A

## 2016-02-02 HISTORY — DX: Localized edema: R60.0

## 2016-02-02 LAB — CBC WITH DIFFERENTIAL/PLATELET
Basophils Absolute: 0 K/uL (ref 0–0.1)
Basophils Relative: 1 %
Eosinophils Absolute: 0.1 K/uL (ref 0–0.7)
Eosinophils Relative: 2 %
HCT: 40 % (ref 35.0–47.0)
Hemoglobin: 13.5 g/dL (ref 12.0–16.0)
Lymphocytes Relative: 35 %
Lymphs Abs: 1.6 K/uL (ref 1.0–3.6)
MCH: 27.3 pg (ref 26.0–34.0)
MCHC: 33.9 g/dL (ref 32.0–36.0)
MCV: 80.7 fL (ref 80.0–100.0)
Monocytes Absolute: 0.4 K/uL (ref 0.2–0.9)
Monocytes Relative: 10 %
Neutro Abs: 2.4 K/uL (ref 1.4–6.5)
Neutrophils Relative %: 52 %
Platelets: 212 K/uL (ref 150–440)
RBC: 4.95 MIL/uL (ref 3.80–5.20)
RDW: 14.3 % (ref 11.5–14.5)
WBC: 4.5 K/uL (ref 3.6–11.0)

## 2016-02-02 LAB — BASIC METABOLIC PANEL
ANION GAP: 6 (ref 5–15)
BUN: 28 mg/dL — AB (ref 6–20)
CALCIUM: 9.3 mg/dL (ref 8.9–10.3)
CO2: 26 mmol/L (ref 22–32)
Chloride: 107 mmol/L (ref 101–111)
Creatinine, Ser: 0.79 mg/dL (ref 0.44–1.00)
GFR calc Af Amer: >60 mL/min (ref >60)
GLUCOSE: 77 mg/dL (ref 65–99)
Potassium: 4.1 mmol/L (ref 3.5–5.1)
Sodium: 139 mmol/L (ref 135–145)

## 2016-02-02 LAB — APTT: aPTT: 31 s (ref 24–36)

## 2016-02-02 LAB — PROTIME-INR
INR: 0.97
Prothrombin Time: 12.9 s (ref 11.4–15.2)

## 2016-02-02 MED ORDER — CHLORHEXIDINE GLUCONATE 4 % EX LIQD
60.0000 mL | Freq: Once | CUTANEOUS | Status: DC
  Filled 2016-02-02: qty 60

## 2016-02-02 NOTE — Patient Instructions (Signed)
  Your procedure is scheduled on: 02/15/16 Tues Report to Same Day Surgery 2nd floor medical mall To find out your arrival time please call [phone] between 1PM - 3PM on 02/14/16 Mon  Remember: Instructions that are not followed completely may result in serious medical risk, up to and including death, or upon the discretion of your surgeon and anesthesiologist your surgery may need to be rescheduled.    _x___ 1. Do not eat food or drink liquids after midnight. No gum chewing or hard candies.     __x__ 2. No Alcohol for 24 hours before or after surgery.   __x__3. No Smoking for 24 prior to surgery.   ____  4. Bring all medications with you on the day of surgery if instructed.    __x__ 5. Notify your doctor if there is any change in your medical condition     (cold, fever, infections).     Do not wear jewelry, make-up, hairpins, clips or nail polish.  Do not wear lotions, powders, or perfumes. You may wear deodorant.  Do not shave 48 hours prior to surgery. Men may shave face and neck.  Do not bring valuables to the hospital.    [facility] is not responsible for any belongings or valuables.               Contacts, dentures or bridgework may not be worn into surgery.  Leave your suitcase in the car. After surgery it may be brought to your room.  For patients admitted to the hospital, discharge time is determined by your treatment team.   Patients discharged the day of surgery will not be allowed to drive home.    Please read over the following fact sheets that you were given:   [facility] Preparing for Surgery and or MRSA Information   _x___ Take these medicines the morning of surgery with A SIP OF WATER:    1. budesonide-formoterol (SYMBICORT) 160-4.5 MCG/ACT inhaler  2.lisinopril (PRINIVIL,ZESTRIL) 20 MG tablet  3.metoprolol succinate (TOPROL-XL) 50 MG 24 hr tablet  4.tiotropium (SPIRIVA) 18 MCG inhalation capsule  5.  6.  ____ Fleet Enema (as directed)   _x___ Use CHG  Soap or sage wipes as directed on instruction sheet   ____ Use inhalers on the day of surgery and bring to hospital day of surgery  ____ Stop metformin 2 days prior to surgery    ____ Take 1/2 of usual insulin dose the night before surgery and none on the morning of           surgery.   ____ Stop aspirin or coumadin, or plavix  _x__ Stop Anti-inflammatories such as Advil, Aleve, Ibuprofen, Motrin, Naproxen,          Naprosyn, Goodies powders or aspirin products. Ok to take Tylenol. Stop Meloxicam and excedrine1 week before surgery  ____ Stop supplements until after surgery.    ____ Bring C-Pap to the hospital.

## 2016-02-08 NOTE — Pre-Procedure Instructions (Signed)
EKG FAXED TO DR ABBY BENDER TO REVIEW AND ARRANGE IF FURTHER WORKUP NEEDED. Marland KitchenUNABLE TO GET THROUGH TO [facility]. FAX CONFIRMATION ON CHART. LM FOR [place]

## 2016-02-11 NOTE — Pre-Procedure Instructions (Signed)
Call to [place] [facility] re final clearance status after review of preop ekg by dr [email or web address] with NURSE MICHELE

## 2016-02-14 NOTE — Pre-Procedure Instructions (Signed)
SPOKE WITH MICHELLE AGAIN THIS AM AND THINKS DR Rebeca Alert WANTS CARDIOLOGY TO REVIEW BEFORE SURGERY. ASKED THAT SHE AS PCP SET PATIENT UP WITH CARDIOLOGIST. RECEIVED FAX AND SENT TO Demond Shallenberger AT SURGEON'S OFFICE. RETURNED [place]. LM FOR Hailey Farrell [place]

## 2016-02-15 ENCOUNTER — Ambulatory Visit: Admission: RE | Admit: 2016-02-15 | Payer: Medicare HMO | Source: Ambulatory Visit | Admitting: Orthopedic Surgery

## 2016-02-15 ENCOUNTER — Encounter: Admission: RE | Payer: Self-pay | Source: Ambulatory Visit

## 2016-02-15 SURGERY — ARTHROSCOPY, SHOULDER WITH REPAIR, ROTATOR CUFF, OPEN
Anesthesia: Choice | Laterality: Left

## 2016-02-22 NOTE — Pre-Procedure Instructions (Signed)
CLEARED BY DR Clayborn Bigness 02/21/16

## 2016-02-28 ENCOUNTER — Other Ambulatory Visit: Payer: Self-pay | Admitting: Orthopedic Surgery

## 2016-03-01 ENCOUNTER — Ambulatory Visit: Payer: Medicare HMO | Admitting: Anesthesiology

## 2016-03-01 ENCOUNTER — Encounter: Payer: Self-pay | Admitting: *Deleted

## 2016-03-01 ENCOUNTER — Observation Stay
Admission: RE | Admit: 2016-03-01 | Discharge: 2016-03-03 | Disposition: A | Discharge status: Home / Self Care | Payer: Medicare HMO | Source: Ambulatory Visit | Attending: Orthopedic Surgery | Admitting: Orthopedic Surgery

## 2016-03-01 ENCOUNTER — Encounter
Admission: RE | Disposition: A | Discharge status: Home / Self Care | Payer: Self-pay | Source: Ambulatory Visit | Attending: Orthopedic Surgery

## 2016-03-01 DIAGNOSIS — Z791 Long term (current) use of non-steroidal anti-inflammatories (NSAID): Secondary | ICD-10-CM | POA: Insufficient documentation

## 2016-03-01 DIAGNOSIS — Z7951 Long term (current) use of inhaled steroids: Secondary | ICD-10-CM | POA: Insufficient documentation

## 2016-03-01 DIAGNOSIS — Z853 Personal history of malignant neoplasm of breast: Secondary | ICD-10-CM | POA: Diagnosis not present

## 2016-03-01 DIAGNOSIS — Z79899 Other long term (current) drug therapy: Secondary | ICD-10-CM | POA: Diagnosis not present

## 2016-03-01 DIAGNOSIS — M19012 Primary osteoarthritis, left shoulder: Secondary | ICD-10-CM | POA: Insufficient documentation

## 2016-03-01 DIAGNOSIS — M75112 Incomplete rotator cuff tear or rupture of left shoulder, not specified as traumatic: Secondary | ICD-10-CM | POA: Diagnosis not present

## 2016-03-01 DIAGNOSIS — M6281 Muscle weakness (generalized): Secondary | ICD-10-CM

## 2016-03-01 DIAGNOSIS — G2581 Restless legs syndrome: Secondary | ICD-10-CM | POA: Insufficient documentation

## 2016-03-01 DIAGNOSIS — J449 Chronic obstructive pulmonary disease, unspecified: Secondary | ICD-10-CM | POA: Insufficient documentation

## 2016-03-01 DIAGNOSIS — M7542 Impingement syndrome of left shoulder: Secondary | ICD-10-CM | POA: Diagnosis not present

## 2016-03-01 DIAGNOSIS — F17200 Nicotine dependence, unspecified, uncomplicated: Secondary | ICD-10-CM | POA: Insufficient documentation

## 2016-03-01 DIAGNOSIS — R112 Nausea with vomiting, unspecified: Secondary | ICD-10-CM | POA: Insufficient documentation

## 2016-03-01 DIAGNOSIS — E785 Hyperlipidemia, unspecified: Secondary | ICD-10-CM | POA: Insufficient documentation

## 2016-03-01 DIAGNOSIS — Z9889 Other specified postprocedural states: Secondary | ICD-10-CM

## 2016-03-01 DIAGNOSIS — I10 Essential (primary) hypertension: Secondary | ICD-10-CM | POA: Insufficient documentation

## 2016-03-01 DIAGNOSIS — G43909 Migraine, unspecified, not intractable, without status migrainosus: Secondary | ICD-10-CM | POA: Diagnosis not present

## 2016-03-01 DIAGNOSIS — M25512 Pain in left shoulder: Secondary | ICD-10-CM

## 2016-03-01 HISTORY — PX: SHOULDER ARTHROSCOPY WITH OPEN ROTATOR CUFF REPAIR: SHX6092

## 2016-03-01 SURGERY — ARTHROSCOPY, SHOULDER WITH REPAIR, ROTATOR CUFF, OPEN
Anesthesia: General | Site: Shoulder | Laterality: Left | Wound class: Clean

## 2016-03-01 MED ORDER — ACETAMINOPHEN 10 MG/ML IV SOLN
INTRAVENOUS | Status: DC | PRN
  Administered 2016-03-01: 1000 mg via INTRAVENOUS

## 2016-03-01 MED ORDER — DOCUSATE SODIUM 100 MG PO CAPS
100.0000 mg | ORAL_CAPSULE | Freq: Two times a day (BID) | ORAL | Status: DC
  Administered 2016-03-01 – 2016-03-02 (×3): 100 mg via ORAL
  Filled 2016-03-01 (×3): qty 1

## 2016-03-01 MED ORDER — NEOMYCIN-POLYMYXIN B GU 40-200000 IR SOLN
Status: AC
  Filled 2016-03-01: qty 20

## 2016-03-01 MED ORDER — MAGNESIUM CITRATE PO SOLN
1.0000 | Freq: Once | ORAL | Status: DC | PRN
  Filled 2016-03-01: qty 296

## 2016-03-01 MED ORDER — ACETAMINOPHEN 10 MG/ML IV SOLN
INTRAVENOUS | Status: AC
  Filled 2016-03-01: qty 100

## 2016-03-01 MED ORDER — FAMOTIDINE 20 MG PO TABS
ORAL_TABLET | ORAL | Status: AC
  Administered 2016-03-01: 20 mg via ORAL
  Filled 2016-03-01: qty 1

## 2016-03-01 MED ORDER — ONDANSETRON HCL 4 MG/2ML IJ SOLN: 4.0000 mg | Freq: Once | INTRAMUSCULAR | Status: DC | PRN

## 2016-03-01 MED ORDER — MENTHOL 3 MG MT LOZG
1.0000 | LOZENGE | OROMUCOSAL | Status: DC | PRN
Start: 2016-03-01 — End: 2016-03-03
  Filled 2016-03-01: qty 9

## 2016-03-01 MED ORDER — ALUM & MAG HYDROXIDE-SIMETH 200-200-20 MG/5ML PO SUSP: 30.0000 mL | ORAL | Status: DC | PRN

## 2016-03-01 MED ORDER — BISACODYL 10 MG RE SUPP: 10.0000 mg | Freq: Every day | RECTAL | Status: DC | PRN

## 2016-03-01 MED ORDER — LIDOCAINE HCL (CARDIAC) 20 MG/ML IV SOLN
INTRAVENOUS | Status: DC | PRN
  Administered 2016-03-01: 80 mg via INTRAVENOUS

## 2016-03-01 MED ORDER — LACTATED RINGERS IV SOLN
INTRAVENOUS | Status: DC
  Administered 2016-03-01 (×2): via INTRAVENOUS

## 2016-03-01 MED ORDER — SODIUM CHLORIDE 0.9 % IV SOLN
INTRAVENOUS | Status: DC
  Administered 2016-03-01 – 2016-03-02 (×2): via INTRAVENOUS

## 2016-03-01 MED ORDER — CEFAZOLIN SODIUM-DEXTROSE 2-4 GM/100ML-% IV SOLN
2.0000 g | Freq: Four times a day (QID) | INTRAVENOUS | Status: AC
  Administered 2016-03-01 – 2016-03-02 (×3): 2 g via INTRAVENOUS
  Filled 2016-03-01 (×3): qty 100

## 2016-03-01 MED ORDER — ACETAMINOPHEN 325 MG PO TABS
650.0000 mg | ORAL_TABLET | Freq: Four times a day (QID) | ORAL | Status: DC | PRN
Start: 2016-03-01 — End: 2016-03-03

## 2016-03-01 MED ORDER — PROPOFOL 10 MG/ML IV BOLUS
INTRAVENOUS | Status: DC | PRN
  Administered 2016-03-01: 160 mg via INTRAVENOUS

## 2016-03-01 MED ORDER — FENTANYL CITRATE (PF) 100 MCG/2ML IJ SOLN
INTRAMUSCULAR | Status: DC | PRN
  Administered 2016-03-01 (×4): 50 ug via INTRAVENOUS

## 2016-03-01 MED ORDER — DEXTROSE 5 % IV SOLN
500.0000 mg | Freq: Four times a day (QID) | INTRAVENOUS | Status: DC | PRN
  Filled 2016-03-01: qty 5

## 2016-03-01 MED ORDER — PHENYLEPHRINE HCL 10 MG/ML IJ SOLN
INTRAMUSCULAR | Status: DC | PRN
  Administered 2016-03-01: 50 ug via INTRAVENOUS
  Administered 2016-03-01 (×2): 100 ug via INTRAVENOUS
  Administered 2016-03-01: 200 ug via INTRAVENOUS
  Administered 2016-03-01: 50 ug via INTRAVENOUS
  Administered 2016-03-01: 100 ug via INTRAVENOUS
  Administered 2016-03-01: 50 ug via INTRAVENOUS
  Administered 2016-03-01: 200 ug via INTRAVENOUS
  Administered 2016-03-01: 50 ug via INTRAVENOUS
  Administered 2016-03-01: 100 ug via INTRAVENOUS

## 2016-03-01 MED ORDER — CEFAZOLIN SODIUM-DEXTROSE 2-4 GM/100ML-% IV SOLN
2.0000 g | INTRAVENOUS | Status: AC
  Administered 2016-03-01: 2 g via INTRAVENOUS

## 2016-03-01 MED ORDER — NEOMYCIN-POLYMYXIN B GU 40-200000 IR SOLN
Status: AC
  Filled 2016-03-01: qty 2

## 2016-03-01 MED ORDER — ACETAMINOPHEN 650 MG RE SUPP: 650.0000 mg | Freq: Four times a day (QID) | RECTAL | Status: DC | PRN

## 2016-03-01 MED ORDER — OXYCODONE HCL 5 MG PO TABS
5.0000 mg | ORAL_TABLET | ORAL | Status: DC | PRN
  Administered 2016-03-01: 5 mg via ORAL

## 2016-03-01 MED ORDER — OXYCODONE HCL 5 MG PO TABS
ORAL_TABLET | ORAL | Status: AC
  Administered 2016-03-01: 5 mg via ORAL
  Filled 2016-03-01: qty 1

## 2016-03-01 MED ORDER — POLYETHYLENE GLYCOL 3350 17 G PO PACK: 17.0000 g | PACK | Freq: Every day | ORAL | Status: DC | PRN

## 2016-03-01 MED ORDER — SUCCINYLCHOLINE CHLORIDE 20 MG/ML IJ SOLN
INTRAMUSCULAR | Status: DC | PRN
  Administered 2016-03-01: 100 mg via INTRAVENOUS

## 2016-03-01 MED ORDER — CHLORHEXIDINE GLUCONATE 4 % EX LIQD: 60.0000 mL | Freq: Once | CUTANEOUS | Status: DC

## 2016-03-01 MED ORDER — ONDANSETRON HCL 4 MG/2ML IJ SOLN
INTRAMUSCULAR | Status: DC | PRN
  Administered 2016-03-01: 4 mg via INTRAVENOUS

## 2016-03-01 MED ORDER — BUPIVACAINE HCL 0.25 % IJ SOLN
INTRAMUSCULAR | Status: DC | PRN
  Administered 2016-03-01: 30 mL

## 2016-03-01 MED ORDER — CEFAZOLIN SODIUM-DEXTROSE 2-4 GM/100ML-% IV SOLN
INTRAVENOUS | Status: AC
  Filled 2016-03-01: qty 100

## 2016-03-01 MED ORDER — METHOCARBAMOL 500 MG PO TABS: 500.0000 mg | ORAL_TABLET | Freq: Four times a day (QID) | ORAL | Status: DC | PRN

## 2016-03-01 MED ORDER — OXYCODONE HCL 5 MG PO TABS
10.0000 mg | ORAL_TABLET | ORAL | Status: DC | PRN
  Administered 2016-03-01 (×2): 15 mg via ORAL
  Administered 2016-03-02: 10 mg via ORAL
  Administered 2016-03-02: 15 mg via ORAL
  Filled 2016-03-01: qty 2
  Filled 2016-03-01 (×3): qty 3

## 2016-03-01 MED ORDER — ONDANSETRON HCL 4 MG/2ML IJ SOLN
4.0000 mg | Freq: Four times a day (QID) | INTRAMUSCULAR | Status: DC | PRN
  Administered 2016-03-01 – 2016-03-02 (×2): 4 mg via INTRAVENOUS
  Filled 2016-03-01 (×2): qty 2

## 2016-03-01 MED ORDER — ONDANSETRON HCL 4 MG PO TABS: 4.0000 mg | ORAL_TABLET | Freq: Four times a day (QID) | ORAL | Status: DC | PRN

## 2016-03-01 MED ORDER — SENNA 8.6 MG PO TABS
1.0000 | ORAL_TABLET | Freq: Two times a day (BID) | ORAL | Status: DC
  Administered 2016-03-01 – 2016-03-02 (×3): 8.6 mg via ORAL
  Filled 2016-03-01 (×3): qty 1

## 2016-03-01 MED ORDER — DEXAMETHASONE SODIUM PHOSPHATE 10 MG/ML IJ SOLN
INTRAMUSCULAR | Status: DC | PRN
  Administered 2016-03-01: 10 mg via INTRAVENOUS

## 2016-03-01 MED ORDER — FENTANYL CITRATE (PF) 100 MCG/2ML IJ SOLN: 25.0000 ug | INTRAMUSCULAR | Status: DC | PRN

## 2016-03-01 MED ORDER — ROCURONIUM BROMIDE 100 MG/10ML IV SOLN
INTRAVENOUS | Status: DC | PRN
  Administered 2016-03-01: 50 mg via INTRAVENOUS

## 2016-03-01 MED ORDER — NEOMYCIN-POLYMYXIN B GU 40-200000 IR SOLN
Status: DC | PRN
  Administered 2016-03-01: 2 mL

## 2016-03-01 MED ORDER — PHENOL 1.4 % MT LIQD
1.0000 | OROMUCOSAL | Status: DC | PRN
  Filled 2016-03-01: qty 177

## 2016-03-01 MED ORDER — EPINEPHRINE HCL 1 MG/ML IJ SOLN
INTRAMUSCULAR | Status: AC
  Filled 2016-03-01: qty 1

## 2016-03-01 MED ORDER — SUGAMMADEX SODIUM 200 MG/2ML IV SOLN
INTRAVENOUS | Status: DC | PRN
  Administered 2016-03-01: 200 mg via INTRAVENOUS

## 2016-03-01 MED ORDER — DIPHENHYDRAMINE HCL 12.5 MG/5ML PO ELIX: 12.5000 mg | ORAL_SOLUTION | ORAL | Status: DC | PRN

## 2016-03-01 MED ORDER — LIDOCAINE HCL 1 % IJ SOLN
INTRAMUSCULAR | Status: DC | PRN
  Administered 2016-03-01: 15 mL
  Administered 2016-03-01: 30 mL

## 2016-03-01 MED ORDER — LIDOCAINE HCL (PF) 1 % IJ SOLN
INTRAMUSCULAR | Status: AC
  Filled 2016-03-01: qty 30

## 2016-03-01 MED ORDER — MIDAZOLAM HCL 2 MG/2ML IJ SOLN
INTRAMUSCULAR | Status: DC | PRN
  Administered 2016-03-01: 2 mg via INTRAVENOUS

## 2016-03-01 MED ORDER — ONDANSETRON HCL 4 MG PO TABS: 4.0000 mg | ORAL_TABLET | Freq: Three times a day (TID) | ORAL | 0 refills | Status: DC | PRN

## 2016-03-01 MED ORDER — CELECOXIB 200 MG PO CAPS
200.0000 mg | ORAL_CAPSULE | Freq: Two times a day (BID) | ORAL | Status: DC
Start: 2016-03-01 — End: 2016-03-03
  Administered 2016-03-01 – 2016-03-02 (×3): 200 mg via ORAL
  Filled 2016-03-01 (×3): qty 1

## 2016-03-01 MED ORDER — FAMOTIDINE 20 MG PO TABS
20.0000 mg | ORAL_TABLET | Freq: Once | ORAL | Status: AC
  Administered 2016-03-01: 20 mg via ORAL

## 2016-03-01 MED ORDER — OXYCODONE HCL 5 MG PO TABS: 5.0000 mg | ORAL_TABLET | ORAL | 0 refills | Status: DC | PRN

## 2016-03-01 MED ORDER — HYDROMORPHONE HCL 1 MG/ML IJ SOLN
1.0000 mg | INTRAMUSCULAR | Status: DC | PRN
  Administered 2016-03-02: 1 mg via INTRAVENOUS
  Filled 2016-03-01: qty 1

## 2016-03-01 MED ORDER — BUPIVACAINE HCL (PF) 0.25 % IJ SOLN
INTRAMUSCULAR | Status: AC
  Filled 2016-03-01: qty 30

## 2016-03-01 SURGICAL SUPPLY — 71 items
ADAPTER IRRIG TUBE 2 SPIKE SOL (ADAPTER) ×4 IMPLANT
ANCHOR ALL-SUT Q-FIX 2.8 (Anchor) ×2 IMPLANT
ANCHOR SUT BIOC ST 3X145 (SUTURE) ×8 IMPLANT
BUR RADIUS 4.0X18.5 (BURR) ×2 IMPLANT
BUR RADIUS 5.5 (BURR) ×2 IMPLANT
CANISTER SUCT LVC 12 LTR MEDI- (MISCELLANEOUS) ×2 IMPLANT
CANNULA 5.75X7 CRYSTAL CLEAR (CANNULA) ×2 IMPLANT
CANNULA PARTIAL THREAD 2X7 (CANNULA) ×2 IMPLANT
CANNULA TWIST IN 8.25X9CM (CANNULA) IMPLANT
CONNECTOR PERFECT PASSER (CONNECTOR) ×2 IMPLANT
COOLER POLAR GLACIER W/PUMP (MISCELLANEOUS) ×2 IMPLANT
CRADLE LAMINECT ARM (MISCELLANEOUS) ×2 IMPLANT
DEVICE SUCT BLK HOLE OR FLOOR (MISCELLANEOUS) IMPLANT
DRAPE IMP U-DRAPE 54X76 (DRAPES) ×4 IMPLANT
DRAPE INCISE IOBAN 66X45 STRL (DRAPES) ×2 IMPLANT
DRAPE SHEET LG 3/4 BI-LAMINATE (DRAPES) ×2 IMPLANT
DRAPE U-SHAPE 47X51 STRL (DRAPES) ×2 IMPLANT
DURAPREP 26ML APPLICATOR (WOUND CARE) ×6 IMPLANT
ELECT REM PT RETURN 9FT ADLT (ELECTROSURGICAL) ×2
ELECTRODE REM PT RTRN 9FT ADLT (ELECTROSURGICAL) ×1 IMPLANT
GAUZE PETRO XEROFOAM 1X8 (MISCELLANEOUS) ×4 IMPLANT
GAUZE SPONGE 4X4 12PLY STRL (GAUZE/BANDAGES/DRESSINGS) ×4 IMPLANT
GLOVE BIOGEL PI IND STRL 9 (GLOVE) ×1 IMPLANT
GLOVE BIOGEL PI INDICATOR 9 (GLOVE) ×1
GLOVE SURG 9.0 ORTHO LTXF (GLOVE) ×4 IMPLANT
GOWN STRL REUS TWL 2XL XL LVL4 (GOWN DISPOSABLE) ×2 IMPLANT
GOWN STRL REUS W/ TWL LRG LVL3 (GOWN DISPOSABLE) ×1 IMPLANT
GOWN STRL REUS W/ TWL LRG LVL4 (GOWN DISPOSABLE) ×1 IMPLANT
GOWN STRL REUS W/TWL LRG LVL3 (GOWN DISPOSABLE) ×1
GOWN STRL REUS W/TWL LRG LVL4 (GOWN DISPOSABLE) ×1
IV LACTATED RINGER IRRG 3000ML (IV SOLUTION) ×18
IV LR IRRIG 3000ML ARTHROMATIC (IV SOLUTION) ×18 IMPLANT
KIT RM TURNOVER STRD PROC AR (KITS) ×2 IMPLANT
KIT STABILIZATION SHOULDER (MISCELLANEOUS) ×2 IMPLANT
KIT SUTURE 2.8 Q-FIX DISP (MISCELLANEOUS) ×2 IMPLANT
KIT SUTURETAK 3.0 INSERT PERC (KITS) ×2 IMPLANT
MANIFOLD NEPTUNE II (INSTRUMENTS) ×2 IMPLANT
MASK FACE SPIDER DISP (MASK) ×2 IMPLANT
MAT BLUE FLOOR 46X72 FLO (MISCELLANEOUS) ×2 IMPLANT
NDL SAFETY 18GX1.5 (NEEDLE) ×2 IMPLANT
NDL SAFETY 22GX1.5 (NEEDLE) ×2 IMPLANT
NS IRRIG 500ML POUR BTL (IV SOLUTION) ×2 IMPLANT
PACK ARTHROSCOPY SHOULDER (MISCELLANEOUS) ×2 IMPLANT
PAD ABD DERMACEA PRESS 5X9 (GAUZE/BANDAGES/DRESSINGS) ×2 IMPLANT
PAD WRAPON POLAR SHDR XLG (MISCELLANEOUS) ×2 IMPLANT
PASSER SUT CAPTURE FIRST (SUTURE) ×4 IMPLANT
SET TUBE SUCT SHAVER OUTFL 24K (TUBING) ×2 IMPLANT
SET TUBE TIP INTRA-ARTICULAR (MISCELLANEOUS) ×2 IMPLANT
STRAP SAFETY BODY (MISCELLANEOUS) ×2 IMPLANT
STRIP CLOSURE SKIN 1/2X4 (GAUZE/BANDAGES/DRESSINGS) ×2 IMPLANT
SUT ETHILON 4-0 (SUTURE) ×2
SUT ETHILON 4-0 FS2 18XMFL BLK (SUTURE) ×2
SUT KNTLS 2.8 MAGNUM (Anchor) ×4 IMPLANT
SUT LASSO 90 DEG SD STR (SUTURE) ×2 IMPLANT
SUT MNCRL 4-0 (SUTURE) ×1
SUT MNCRL 4-0 27XMFL (SUTURE) ×1
SUT PDS AB 0 CT1 27 (SUTURE) ×2 IMPLANT
SUT PERFECTPASSER WHITE CART (SUTURE) ×4 IMPLANT
SUT SMART STITCH CARTRIDGE (SUTURE) ×2 IMPLANT
SUT VIC AB 0 CT1 36 (SUTURE) ×2 IMPLANT
SUT VIC AB 2-0 CT2 27 (SUTURE) ×2 IMPLANT
SUTURE ETHLN 4-0 FS2 18XMF BLK (SUTURE) ×2 IMPLANT
SUTURE MAGNUM WIRE 2X48 BLK (SUTURE) IMPLANT
SUTURE MNCRL 4-0 27XMF (SUTURE) ×1 IMPLANT
SYRINGE 10CC LL (SYRINGE) ×2 IMPLANT
TAPE MICROFOAM 4IN (TAPE) ×2 IMPLANT
TUBING ARTHRO INFLOW-ONLY STRL (TUBING) ×2 IMPLANT
TUBING CONNECTING 10 (TUBING) ×2 IMPLANT
WAND COBLATION FLOW 50 (SURGICAL WAND) ×2 IMPLANT
WAND HAND CNTRL MULTIVAC 90 (MISCELLANEOUS) IMPLANT
WRAPON POLAR PAD SHDR XLG (MISCELLANEOUS) ×4

## 2016-03-01 NOTE — OR Nursing (Signed)
Pt admitted to 142, transported via recliner chair. Husband notified of room assignment.  tranferred stable

## 2016-03-01 NOTE — Anesthesia Preprocedure Evaluation (Signed)
Anesthesia Evaluation  Patient identified by MRN, date of birth, ID band Patient awake    Reviewed: Allergy & Precautions, H&P , NPO status , Patient's Chart, lab work & pertinent test results, reviewed documented beta blocker date and time   Airway Mallampati: III  TM Distance: >3 FB Neck ROM: full    Dental  (+) Partial Upper, Partial Lower, Poor Dentition   Pulmonary neg pulmonary ROS, neg shortness of breath, neg sleep apnea, COPD,  COPD inhaler, neg recent URI, Current Smoker,    breath sounds clear to auscultation + decreased breath sounds      Cardiovascular Exercise Tolerance: Good hypertension, On Medications (-) angina(-) CAD, (-) Past MI, (-) Cardiac Stents and (-) CABG Normal cardiovascular exam(-) dysrhythmias (-) Valvular Problems/Murmurs Rhythm:regular Rate:Normal     Neuro/Psych negative neurological ROS  negative psych ROS   GI/Hepatic negative GI ROS, Neg liver ROS,   Endo/Other  negative endocrine ROS  Renal/GU negative Renal ROS  negative genitourinary   Musculoskeletal   Abdominal   Peds  Hematology negative hematology ROS (+)   Anesthesia Other Findings Past Medical History: No date: Breast cancer (HCC)     Comment: Right No date: COPD (chronic obstructive pulmonary disease) (* No date: Elevated lipids No date: Hypertension No date: Lower extremity edema No date: Migraines No date: Migraines No date: Restless leg syndrome No date: Rotator cuff injury   Reproductive/Obstetrics negative OB ROS                             Anesthesia Physical Anesthesia Plan  ASA: III  Anesthesia Plan: General   Post-op Pain Management:    Induction:   Airway Management Planned:   Additional Equipment:   Intra-op Plan:   Post-operative Plan:   Informed Consent: I have reviewed the patients History and Physical, chart, labs and discussed the procedure including the  risks, benefits and alternatives for the proposed anesthesia with the patient or authorized representative who has indicated his/her understanding and acceptance.   Dental Advisory Given  Plan Discussed with: Anesthesiologist, CRNA and Surgeon  Anesthesia Plan Comments:         Anesthesia Quick Evaluation

## 2016-03-01 NOTE — H&P (Signed)
The patient has been re-examined, and the chart reviewed, and there have been no interval changes to the documented history and physical.    The risks, benefits, and alternatives have been discussed at length, and the patient is willing to proceed.   

## 2016-03-01 NOTE — Transfer of Care (Deleted)
Immediate Anesthesia Transfer of Care Note  Patient: Hailey Farrell  Procedure(s) Performed: Procedure(s): SHOULDER ARTHROSCOPY WITH OPEN ROTATOR CUFF REPAIR (Left)  Patient Location: PACU  Anesthesia Type:General  Level of Consciousness: sedated  Airway & Oxygen Therapy: Patient Spontanous Breathing and Patient connected to face mask oxygen  Post-op Assessment: Report given to RN and Post -op Vital signs reviewed and stable  Post vital signs: Reviewed and stable  Last Vitals:  Vitals:   03/01/16 1120  BP: 130/82  Pulse: 83  Resp: 18  Temp: 36.9 C    Last Pain:  Vitals:   03/01/16 1120  TempSrc: Oral  PainSc: 5          Complications: No apparent anesthesia complications

## 2016-03-01 NOTE — Anesthesia Procedure Notes (Signed)
Procedure Name: Intubation Date/Time: 03/01/2016 1:28 PM Performed by: Aline Brochure Pre-anesthesia Checklist: Patient identified, Emergency Drugs available, Suction available and Patient being monitored Patient Re-evaluated:Patient Re-evaluated prior to inductionOxygen Delivery Method: Circle system utilized Preoxygenation: Pre-oxygenation with 100% oxygen Intubation Type: IV induction Ventilation: Two handed mask ventilation required and Oral airway inserted - appropriate to patient size Laryngoscope Size: McGraph and 3 Grade View: Grade I Tube type: Oral Tube size: 7.0 mm Number of attempts: 2 Airway Equipment and Method: Video-laryngoscopy and Stylet Placement Confirmation: ETT inserted through vocal cords under direct vision,  positive ETCO2 and breath sounds checked- equal and bilateral Secured at: 20 cm Dental Injury: Teeth and Oropharynx as per pre-operative assessment  Difficulty Due To: Difficulty was anticipated, Difficult Airway- due to large tongue, Difficult Airway- due to immobile epiglottis and Difficult Airway- due to anterior larynx

## 2016-03-01 NOTE — Discharge Instructions (Signed)

## 2016-03-01 NOTE — OR Nursing (Signed)
Pt husband in to see patient in post op rm 1.  States dr told him she could be admitted overnight if she had to much pain.  Requests pt be admitted.  Dr Mack Guise notified and admission orders received.

## 2016-03-01 NOTE — Transfer of Care (Signed)
Immediate Anesthesia Transfer of Care Note  Patient: ANGELICAMARIE CARNATHAN  Procedure(s) Performed: Procedure(s): SHOULDER ARTHROSCOPY WITH OPEN ROTATOR CUFF REPAIR (Left)  Patient Location: PACU  Anesthesia Type:General  Level of Consciousness: sedated  Airway & Oxygen Therapy: Patient connected to face mask oxygen  Post-op Assessment: Post -op Vital signs reviewed and stable  Post vital signs: stable  Last Vitals:  Vitals:   03/01/16 1120 03/01/16 1741  BP: 130/82 (!) 173/99  Pulse: 83 77  Resp: 18 (!) 9  Temp: 36.9 C 36.1 C    Last Pain:  Vitals:   03/01/16 1741  TempSrc: Temporal  PainSc:          Complications: No apparent anesthesia complications

## 2016-03-01 NOTE — Op Note (Signed)
03/01/2016  5:41 PM  PATIENT:  Hailey Farrell  65 y.o. female  PRE-OPERATIVE DIAGNOSIS:  Left shoulder partial rotator cuff tear, posterior labral repair with paravertebral cyst subacromial impingement, and acromioclavicular arthritis   POST-OPERATIVE DIAGNOSIS:  Left shoulder partial rotator cuff tear, posterior labral repair with paravertebral cyst subacromial impingement, os acrominale and acromioclavicular arthritis   PROCEDURE:  Procedure(s): 1.  LEFT SHOULDER ARTHROSCOPY  2.  ARTHROSCOPIC PARALABRAL CYST DECOMPRESSION 3.  ARTHROSCOPIC POSTERIOR LABRAL REPAIR 4.  ARTHROSCOPIC ANTERIOR CAPSULORRHAPHY 5.  DEBRIDEMENT OF OS ACROMINALE 6.  SUBACROMIAL DECOMPRESSION 7.  DISTAL CLAVICLE EXCISION 8.  MINI-OPEN ROTATOR CUFF REPAIR   SURGEON:  Surgeon(s) and Role:    * Thornton Park, MD - Primary  ANESTHESIA:   local and general   PREOPERATIVE INDICATIONS:  Hailey Farrell is a  65 y.o. female with a diagnosis of a left shoulder partial rotator cuff tear, posterior labral repair with paravertebral cyst subacromial impingement, and acromioclavicular arthritis who failed conservative measures and elected for surgical management.    The risks benefits and alternatives were discussed with the patient preoperatively including but not limited to the risks of infection, bleeding, nerve injury, persistent pain or weakness, failure of the hardware, re-tear of the rotator cuff and the need for further surgery. Medical risks include DVT and pulmonary embolism, myocardial infarction, stroke, pneumonia, respiratory failure and death. Patient understood these risks and wished to proceed.  OPERATIVE IMPLANTS: ArthroCare Magnum 2 anchors x 2 & [employer] Q Fix anchor x 1  OPERATIVE FINDINGS: Posterior labral tear, anterior capsular laxity, partial thickness rotator cuff tear involving the supraspinatus, subacromial impingement with an os acrominale, acromioclavicular joint arthrosis  OPERATIVE  PROCEDURE: The patient was met in the preoperative area. The left shoulder was signed with the word yes and my initials according the hospital's correct site of surgery protocol.  History and physical was updated. Patient was brought to the operating room where she underwent general anesthesia. The patient was placed in a beachchair position.  A spider arm positioner was used for this case. Examination under anesthesia revealed  instability with load shift testing in both the anterior and posterior directions. The patient had a negative sulcus sign.  Patient was prepped and draped in a sterile fashion. A timeout was performed to verify the patient's name, date of birth, medical record number, correct site of surgery and correct procedure to be performed there was also used to verify the patient received antibiotics that all appropriate instruments, implants and radiographs studies were available in the room. Once all in attendance were in agreement case began.  Bony landmarks were drawn out with a surgical marker along with proposed arthroscopy incisions. These were pre-injected with 1% lidocaine plain. An 11 blade was used to establish a posterior portal through which the arthroscope was placed in the glenohumeral joint. A full diagnostic examination of the shoulder was performed.  Patient was found to have a torn superior posterior labrum, anterior capsular laxity and a high-grade partial thickness rotator cuff tear involving the supraspinatus. An 18-gauge spinal needle was used to place a 0 PDS suture through the rotator cuff tear for identification from the bursal side.  The arthroscope was then placed in the subacromial space.  A lateral portal was created with an 11 blade.  A bursectomy was performed with the 4.0 resector shaver blade and an ArthroCare wand.  The 0 PDS suture was identified. The partial-thickness tear was then completed with the 4-0 resector shaver blade.  The arthroscope was then  placed back into the glenohumeral joint via the posterior portal. The 4-0 resector shaver blade was placed into the glenohumeral joint through the rotator cuff tear and used to debride the posterior and superior labrum.  The shaver along with a nerve hook was used to decompress the palmar labral cyst.   An anterolateral portal was then created using a spinal needle for localization. Patient had anterior capsular laxity. The anterior labrum was diminutive and did not provide a mechanical bumper to the humeral head.  An arthroscopic elevator was placed through the anterolateral portal and used to mobilize the anterior inferior labrum. A 4.0 resector shaver blade was then used to debride the interval between the labrum and glenoid.  An Arthrex bio suture tack anchor was then placed along the anterior inferior labrum. A 90 suture lasso was used to advance the anterior capsule and shuttle one limb of this anchor under the labrum. The suture limbs of the anchor were secured using an arthroscopic knot tying technique. An anterior inferior labral bumper was reestablished.  Through the anterior lateral portal a 4.0 resector shaver blade was then used to burr the superior posterior glenoid in preparation for superior posterior labral repair. A single Arthrex bio suture Tak anchor was then placed just posterior to the biceps attachment to the labrum. Again a 90 Arthrex suture lasso was used to shuttle limb of this suture under the labrum. Arthroscopic knot tying technique was used to secure the anchor.  The arthroscope was then placed through the anterolateral portal using switching sticks. The Arthrex percutaneous drill set for the biosuture Tak anchors was then utilized to place two additional [employer] anchors in the superior posterior glenoid. Arthroscopic knot tying technique was used to secure the superior posterior labrum. Final arthroscopic images of the labral repair were taken.  The arthroscope  was then placed back into the subacromial space through the posterior portal. A subacromial decompression was performed using a 5.5 resector shaver blade. A small os acromiale he was encountered at the area anterior edge of the acromion. This was debrided with the resector shaver blade. A 5.5 resector shaver blade was then placed to the anterior portal to perform a distal clavicle excision. A single Perfect Pass suture were placed in the lateral border of the rotator cuff tear to mark its location. All arthroscopic instruments were then removed and the mini-open portion of the procedure began.     A saber-type incision was made along the lateral border of the acromion. The deltoid muscle was identified and split in line with its fibers which allowed visualization of the rotator cuff. The Perfect Pass suture previously placed in the lateral border of the rotator cuff were brought out through the deltoid split.  A 5.5 mm resector shaver blade was then used to debride the greater tuberosity of all torn fibers of the rotator cuff.  A single [employer] anchor was placed at the articular margin of the humeral head with the greater tuberosity. The suture limbs of the Q Fix anchor were passed medially through the rotator cuff using a First Pass suture passer.   A second Perfect Pass suture was then placed in the lateral border of the rotator cuff. Both Perfect Pass sutures were then anchored to the greater tuberosity footprint using two Magnum 2 anchors. These anchors were tensioned to allow for anatomic reduction of the rotator cuff to the greater tuberosity. The medial row sutures were then tied  down using an arthroscopic knot tying technique.  Arthroscopic images of the repair were taken with the arthroscope both externally and from inside the glenohumeral joint.  All incisions were copiously irrigated. The deltoid fascia was repaired using a 0 Vicryl suture.  The subcutaneous tissue of all incisions  were closed with a 2-0 Vicryl. Skin closure for the arthroscopic incisions was performed with 4-0 nylon. The skin edges of the saber incision was approximated with a running 4-0 undyed Monocryl.  0.25% Marcaine plain was then injected into the subacromial space for postoperative pain control. A dry sterile dressing was applied.  The patient was placed in an abduction sling and Polar Care sleeve.  All sharp and it instrument counts were correct at the conclusion of the case. I was scrubbed and present for the entire case. I spoke with the patient's husband postoperatively to let him know the case had gone without complication and the patient was stable in recovery room.

## 2016-03-02 ENCOUNTER — Encounter: Payer: Self-pay | Admitting: Orthopedic Surgery

## 2016-03-02 DIAGNOSIS — M75112 Incomplete rotator cuff tear or rupture of left shoulder, not specified as traumatic: Secondary | ICD-10-CM | POA: Diagnosis not present

## 2016-03-02 MED ORDER — HYDROCODONE-ACETAMINOPHEN 5-325 MG PO TABS
2.0000 | ORAL_TABLET | ORAL | Status: DC | PRN
  Administered 2016-03-02 (×2): 2 via ORAL
  Administered 2016-03-02: 1 via ORAL
  Administered 2016-03-03 (×2): 2 via ORAL
  Filled 2016-03-02 (×4): qty 2

## 2016-03-02 MED ORDER — HYDROCODONE-ACETAMINOPHEN 5-325 MG PO TABS: 1.0000 | ORAL_TABLET | ORAL | 0 refills | Status: DC | PRN

## 2016-03-02 MED ORDER — PROMETHAZINE HCL 25 MG/ML IJ SOLN: 12.5000 mg | Freq: Four times a day (QID) | INTRAMUSCULAR | Status: DC | PRN

## 2016-03-02 NOTE — Progress Notes (Signed)
  Subjective:  Patient's pain improved after switch to norco.  Still has nausea, but she feels it is diminishing.  Patient reports pain as mild currently.  Tolerating PO diet.  Husband at the bedside.    Objective:   VITALS:   Vitals:   03/02/16 0559 03/02/16 0740 03/02/16 0850 03/02/16 0854  BP: 118/74 128/72 129/75 140/85  Pulse: 89 84 70 72  Resp: 19 16    Temp: 98.4 F (36.9 C) 98.2 F (36.8 C)    TempSrc: Oral Oral    SpO2: 96% 96% 100% 95%    PHYSICAL EXAM:  Left shoulder:  Dressing c/d/i.   Sling and polar care in place.  NVI.   LABS  No results found for this or any previous visit (from the past 24 hour(s)).  No results found.  Assessment/Plan: 1 Day Post-Op   Active Problems:   S/P rotator cuff repair  Patient improving.  Continue pain management and antiemetic overnight.  Will add phenergan for nausea.  Likely discharge in the AM.    Thornton Park , MD 03/02/2016, 5:53 PM

## 2016-03-02 NOTE — Care Management Note (Signed)
Case Management Note  Patient Details  Name: Hailey Farrell MRN: [id number] Date of Birth: 09-26-1950  Subjective/Objective:  POD 1 s/p left shoulder arthroplasty with rotator cuff repair. Case discussed with Dr. Mack Guise, patient will not need lovenox, only HH PT/OT.  Met with aptient at bedside. She lives at home with her  Spouse who will be patient's caregiver. Patient has no agency preference for home health. Referral to [facility]. Ordered hemi walker per PT recommendations.                  Action/Plan: [facility] for PT/OT and hemi walker. [facility] notified that patient will be discharged today,   Expected Discharge Date:  03/02/16               Expected Discharge Plan:  [place]  In-House Referral:     Discharge planning Services  CM Consult  Post Acute Care Choice:  Durable Medical Equipment, Home Health Choice offered to:  Patient  DME Arranged:  Walker hemi DME Agency:  [place] Arranged:  PT, OT [facility] Agency:  [place]  Status of Service:  Completed, signed off  If discussed at [place] of Stay Meetings, dates discussed:    Additional Comments:  Jolly Mango, RN 03/02/2016, 9:20 AM

## 2016-03-02 NOTE — Progress Notes (Signed)
Physical Therapy Treatment Patient Details Name: Hailey Farrell MRN: [id number] DOB: 1951/04/08 Today's Date: 03/02/2016    History of Present Illness Pt underwent extensive L shoulder surgery including RTC repair, labral repair, cyst decompression, and distal acromial excision. Pt was admitted overnight for pain control. Pt is POD#1 at time of evaluation. Denies falls in the last 12 months.     PT Comments    Pt is able to ambulate to rehab gym and back to bed this PM. She is able to safely complete stair training and reports confidence donning/doffing L shoulder sling. Pt has met PT barriers to discharge and will be safe to return home with [facility] PT when medically appropriate. Pt will benefit from skilled PT services to address deficits in strength, balance, and mobility in order to return to full function at home.   Follow Up Recommendations  Home health PT     Equipment Recommendations  Other (comment) (Hemiwalker)    Recommendations for Other Services       Precautions / Restrictions Precautions Precautions: Shoulder Shoulder Interventions: Shoulder sling/immobilizer Precaution Booklet Issued: Yes (comment) Required Braces or Orthoses: Sling Restrictions Weight Bearing Restrictions: Yes LUE Weight Bearing: Non weight bearing    Mobility  Bed Mobility Overal bed mobility: Modified Independent             General bed mobility comments: Fair speed/sequencing  Transfers Overall transfer level: Needs assistance Equipment used: Hemi-walker Transfers: Sit to/from Stand Sit to Stand: Min guard         General transfer comment: Pt demonstrates improved speed compared to AM session. She still requires heavy support on HW during transfer. Once upright pt feels mildly lightheaded but eventually symptoms subside  Ambulation/Gait Ambulation/Gait assistance: Min guard Ambulation Distance (Feet): 100 Feet Assistive device: None Gait Pattern/deviations: Decreased step length  - right;Decreased step length - left Gait velocity: Decreased but functional for household and limited community distances Gait velocity interpretation: <1.8 ft/sec, indicative of risk for recurrent falls General Gait Details: Pt able to ambulate to rehab gym and back to room with IV pole intermittently for assistance. Decreased gait speed but functional for full household mobility   Stairs Stairs: Yes Stairs assistance: Min guard Stair Management: One rail Right;Step to pattern Number of Stairs: 4 General stair comments: Pt educated about proper sequencing with stairs and use of railing. Pt leads with LLE during ascent as this is her stronger and least painful leg. Pt is able to complete stair training slowly but safely.  Wheelchair Mobility    Modified Rankin (Stroke Patients Only)       Balance Overall balance assessment: Needs assistance Sitting-balance support: No upper extremity supported Sitting balance-Leahy Scale: Good     Standing balance support: No upper extremity supported Standing balance-Leahy Scale: Fair                      Cognition Arousal/Alertness: Awake/alert Behavior During Therapy: WFL for tasks assessed/performed Overall Cognitive Status: Within Functional Limits for tasks assessed                      Exercises      General Comments        Pertinent Vitals/Pain Pain Assessment: 0-10 Pain Score: 5  Pain Location: Left shoulder Pain Descriptors / Indicators: Aching Pain Intervention(s): Monitored during session;Premedicated before session    Home Living  Prior Function            PT Goals (current goals can now be found in the care plan section) Acute Rehab PT Goals Patient Stated Goal: To return home. PT Goal Formulation: With patient Time For Goal Achievement: 03/16/16 Potential to Achieve Goals: Good Progress towards PT goals: Progressing toward goals    Frequency    BID       PT Plan Current plan remains appropriate    Co-evaluation             End of Session Equipment Utilized During Treatment: Gait belt Activity Tolerance: Patient tolerated treatment well Patient left: in bed;with bed alarm set;with family/visitor present (Polar care applied, pillow supporting under LUE)     Time: 1545-1600 PT Time Calculation (min) (ACUTE ONLY): 15 min  Charges:  $Gait Training: 8-22 mins                    G Codes:      Lyndel Safe Atara Paterson PT, DPT   Desiree Daise 03/02/2016, 5:20 PM

## 2016-03-02 NOTE — Progress Notes (Signed)
Shift assessment completed at 0900. Pt is oob to chair at that time. Per physical therapy, pt had some emesis when getting oob, became dizzy, orthostatics were  Negative. Per pt , she feels better now, pt had last received zofran at 0630 today. Pt received am meds and did not have any further nausea or emesis. Pt has an immobilizer to her l shoulder with polar care in place. Radial pulse is wnl and pti s ableo t move her fingers well. Lungs clear bilat, pt is on room air, S1S2 heard, abdomen is soft, bs hypoactive. PPP, no edema noted. PIV #20 intact to R arm with IVF ns infusing at 36mls/hr, site is free of redness and swelling. Pth as received oxycodone for pain, and states she still hurts. This Probation officer paged Dr. Leamon Arnt, Caroline nurse returned the call, explained that I needed ot know if Dr. Intends to see pt prior to discharge, or if pt is clear for d/c. She will have Dr. Call back. This Probation officer explained to pt that d/c is ready, am waiting to hear for Md, pt stated she wants to see dr before she goes. Dr. Is paged to relay this.

## 2016-03-02 NOTE — Evaluation (Signed)
Occupational Therapy Evaluation Patient Details Name: TIERNEY WOLDEN MRN: [id number] DOB: 1951-03-27 Today's Date: 03/02/2016    History of Present Illness Pt. is a 65 y.o. female who was admitted to [facility] for a Left RTC repair.   Clinical Impression   Pt. Is a 65 y.o. Female who was admitted to [facility] for a Left RTC repair. Pt. Presents with 7/10 pain, limited left shoulder ROM, impaired LUE functional use, nausea, and decreased functional mobility which hinders her ability to complete UE, and LE ADL tasks. Pt. Could benefit from skilled OT services for ADL training, A/E training, UE there. Ex, functional mobility, and pt. education in work simplification techniques during ADLs in order to return to her PLOF.     Follow Up Recommendations  Home health OT    Equipment Recommendations       Recommendations for Other Services PT consult     Precautions / Restrictions Precautions Precautions: Shoulder Shoulder Interventions: Shoulder sling/immobilizer Precaution Booklet Issued: Yes (comment) Required Braces or Orthoses: Sling Restrictions Weight Bearing Restrictions: Yes LUE Weight Bearing: Non weight bearing              ADL Overall ADL's : Needs assistance/impaired Eating/Feeding: Set up   Grooming: Set up           Upper Body Dressing : Maximal assistance   Lower Body Dressing: Moderate assistance               Functional mobility during ADLs: Min guard General ADL Comments: Pt. and husband persent. Pt. education was provided about A/E use for LE ADLs.     Vision     Perception     Praxis      Pertinent Vitals/Pain Pain Assessment: 0-10 Pain Score: 7  Pain Location: Left shoulder Pain Descriptors / Indicators: Aching Pain Intervention(s): Monitored during session;RN gave pain meds during session;Repositioned;Limited activity within patient's tolerance     Hand Dominance Left   Extremity/Trunk Assessment LUE: AROM per conservative protocol  for elbow, wrist, and digit   Communication Communication Communication: No difficulties   Cognition Arousal/Alertness: Awake/alert Behavior During Therapy: WFL for tasks assessed/performed Overall Cognitive Status: Within Functional Limits for tasks assessed                     General Comments       Exercises       Home Living Family/patient expects to be discharged to:: Private residence Living Arrangements: Spouse/significant other Available Help at Discharge: Family Type of Home: Apartment Home Access: Stairs to enter Technical brewer of Steps: 1 Entrance Stairs-Rails:  (Pole on left hand side) Home Layout: One level     Bathroom Shower/Tub: Tub/shower unit;Curtain Shower/tub characteristics: Architectural technologist: Standard     Home Equipment: Environmental consultant - standard;Cane - single point   Additional Comments: No BSC, no lift chair, no hospital bed, no shower chair, no grab bars      Prior Functioning/Environment Level of Independence: Independent with assistive device(s)        Comments: Most recently pt was ambulating without assistive device full community distances. However pt was using standard walker to help with transfers due to R knee weakness/pain. Independent with ADLs/IADLs. Drives        OT Problem List: Decreased strength;Decreased coordination;Decreased range of motion;Pain;Decreased activity tolerance;Impaired UE functional use   OT Treatment/Interventions: Self-care/ADL training;Therapeutic exercise;Patient/family education;DME and/or AE instruction;Energy conservation;Therapeutic activities    OT Goals(Current goals can be found in the care plan section)  Acute Rehab OT Goals Patient Stated Goal: To return home. OT Goal Formulation: With patient Potential to Achieve Goals: Good  OT Frequency: Min 1X/week   Barriers to D/C:            Co-evaluation              End of Session Equipment Utilized During Treatment: Gait  belt  Activity Tolerance: Patient tolerated treatment well Patient left: in chair;with call bell/phone within reach;with chair alarm set;with family/visitor present   Time: [id number] OT Time Calculation (min): 30 min Charges:  OT General Charges $OT Visit: 1 Procedure OT Evaluation $OT Eval Moderate Complexity: 1 Procedure OT Treatments $Self Care/Home Management : 8-22 mins G-Codes: OT G-codes **NOT FOR INPATIENT CLASS** Functional Assessment Tool Used: CLinical Judgement based on pt. current functional status. Functional Limitation: Self care Self Care Current Status [phone]): At least 40 percent but less than 60 percent impaired, limited or restricted Self Care Goal Status [id number]): At least 1 percent but less than 20 percent impaired, limited or restricted  Harrel Carina, MS, OTR/L 03/02/2016, 10:39 AM

## 2016-03-02 NOTE — Care Management Obs Status (Signed)
[facility] [facility] NOTIFICATION   Patient Details  Name: Hailey Farrell MRN: [id number] Date of Birth: 04-06-1951   Medicare Observation Status Notification Given:  Yes    Jolly Mango, RN 03/02/2016, 11:04 AM

## 2016-03-02 NOTE — Evaluation (Signed)
Physical Therapy Evaluation Patient Details Name: Hailey Farrell MRN: [id number] DOB: 1951/03/16 Today's Date: 03/02/2016   History of Present Illness  Pt underwent extensive L shoulder surgery including RTC repair, labral repair, cyst decompression, and distal acromial excision. Pt was admitted overnight for pain control. Pt is POD#1 at time of evaluation. Denies falls in the last 12 months.   Clinical Impression  Pt admitted with above diagnosis. Pt currently with functional limitations due to the deficits listed below (see PT Problem List).  Pt limited initially by vomiting and feeling lightheaded however once symptoms pass able to participate with PT evaluation. Pt requires heavy assist by RUE on hemiwalker during transfer. No external assist required by therapist. Once upright able to ambulate to RN station and back to recliner without assistive device and without external assist by therapist except for CGA for safety. Pt will need hemiwalker at discharge to assist with independence during transfers. Recommend HH PT per ortho discretion. Pt will benefit from skilled PT services to address deficits in strength, balance, and mobility in order to return to full function at home.     Follow Up Recommendations Home health PT    Equipment Recommendations  Other (comment) (Hemiwalker)    Recommendations for Other Services       Precautions / Restrictions Precautions Precautions: Shoulder;None Shoulder Interventions: Shoulder sling/immobilizer Precaution Booklet Issued: Yes (comment) Required Braces or Orthoses: Sling Restrictions Weight Bearing Restrictions: Yes LUE Weight Bearing: Non weight bearing      Mobility  Bed Mobility Overal bed mobility: Modified Independent             General bed mobility comments: Pt able to perform supine to sit with use of bed rail and HOB elevated. Pt requires increased time but able to come to sitting without external assist.    Transfers Overall transfer level: Needs assistance Equipment used: Hemi-walker Transfers: Sit to/from Stand Sit to Stand: Min guard         General transfer comment: Pt demonstrates significant LE weakness during transfers. Reports she uses a walker at baseline just for transfers and then is able to ambulate without assistance. Heavy RUE reliance on hemiwalker during transfer. She requires extended time to come to standing and heavy forward/backward rocking to gain momentum. Once upright she is steady and stable in standing. After first transfer pt reports feeling faint and lightheaded. Returned to sitting and vitals obtained. No hypotension noted. Pt proceeds to vomit a large amount of emesis. After vomiting pt reports she feels better  Ambulation/Gait Ambulation/Gait assistance: Min guard Ambulation Distance (Feet): 80 Feet Assistive device: None Gait Pattern/deviations: Decreased step length - right;Decreased step length - left Gait velocity: Decreased but functional for household and limited community distances Gait velocity interpretation: <1.8 ft/sec, indicative of risk for recurrent falls General Gait Details: After transferring with hemiwalker pt does not need assistance for ambulation. Decreased step length but pt is steady during ambulation. Good scanning of visual environment. No evidence for imbalance  Stairs            Wheelchair Mobility    Modified Rankin (Stroke Patients Only)       Balance Overall balance assessment: Needs assistance Sitting-balance support: No upper extremity supported Sitting balance-Leahy Scale: Good     Standing balance support: No upper extremity supported Standing balance-Leahy Scale: Fair  Pertinent Vitals/Pain Pain Assessment: 0-10 Pain Score: 5  Pain Location: L shoulder Pain Descriptors / Indicators: Aching;Burning Pain Intervention(s): Monitored during session;Premedicated  before session    Home Living Family/patient expects to be discharged to:: Private residence Living Arrangements: Spouse/significant other Available Help at Discharge: Family Type of Home: Apartment Home Access: Stairs to enter Entrance Stairs-Rails:  (Pole on left hand side) Entrance Stairs-Number of Steps: 1 Home Layout: One level Home Equipment: Walker - standard;Cane - single point Additional Comments: No BSC, no lift chair, no hospital bed, no shower chair, no grab bars    Prior Function Level of Independence: Independent with assistive device(s)         Comments: Most recently pt was ambulating without assistive device full community distances. However pt was using standard walker to help with transfers due to R knee weakness/pain. Independent with ADLs/IADLs. Drives     Hand Dominance   Dominant Hand: Left    Extremity/Trunk Assessment   Upper Extremity Assessment: LUE deficits/detail       LUE Deficits / Details: Full sensation to left hand. Finger flexion and grip strength intact. Decreased R UE strength with numbness in index finger and thumb. Pt reports that MD is aware   Lower Extremity Assessment: Generalized weakness (RLE appears weaker than LLE but difficult to assess. Able to perform sit to stand without external assist)         Communication   Communication: No difficulties  Cognition Arousal/Alertness: Awake/alert Behavior During Therapy: WFL for tasks assessed/performed Overall Cognitive Status: Within Functional Limits for tasks assessed                      General Comments      Exercises Other Exercises Other Exercises: Pt provided handout and explained hand, wrist, and elbow exercises Donning/doffing sling/immobilizer: Set-up;Minimal assistance Correct positioning of sling/immobilizer: Independent Pendulum exercises (written home exercise program): Minimal assistance ROM for elbow, wrist and digits of operated UE: Minimal  assistance   Assessment/Plan    PT Assessment Patient needs continued PT services  PT Problem List Decreased strength;Decreased range of motion;Decreased activity tolerance;Decreased balance;Decreased mobility;Decreased knowledge of use of DME;Obesity;Pain          PT Treatment Interventions DME instruction;Gait training;Stair training;Functional mobility training;Therapeutic activities;Therapeutic exercise;Balance training;Neuromuscular re-education;Patient/family education;Manual techniques    PT Goals (Current goals can be found in the Care Plan section)  Acute Rehab PT Goals Patient Stated Goal: Return to improved function at home with less pain PT Goal Formulation: With patient Time For Goal Achievement: 03/16/16 Potential to Achieve Goals: Good    Frequency BID   Barriers to discharge   Difficulty with transfers. Will need hemiwalker to perform    Co-evaluation               End of Session Equipment Utilized During Treatment: Gait belt Activity Tolerance: Patient tolerated treatment well;Other (comment) (After initial episode of vomiting and feeling faint) Patient left: in chair;with call bell/phone within reach;with chair alarm set (OT in room with patient) Nurse Communication: Mobility status;Other (comment) (Vomiting)    Functional Assessment Tool Used: clinical judgement Functional Limitation: Mobility: Walking and moving around Mobility: Walking and Moving Around Current Status [phone]): At least 40 percent but less than 60 percent impaired, limited or restricted Mobility: Walking and Moving Around Goal Status [phone]): At least 20 percent but less than 40 percent impaired, limited or restricted    Time: 0828-0901 PT Time Calculation (min) (ACUTE ONLY): 33 min  Charges:   PT Evaluation $PT Eval Low Complexity: 1 Procedure PT Treatments $Gait Training: 8-22 mins   PT G Codes:   PT G-Codes **NOT FOR INPATIENT CLASS** Functional Assessment Tool Used:  clinical judgement Functional Limitation: Mobility: Walking and moving around Mobility: Walking and Moving Around Current Status [id number]): At least 40 percent but less than 60 percent impaired, limited or restricted Mobility: Walking and Moving Around Goal Status [phone]): At least 20 percent but less than 40 percent impaired, limited or restricted   Phillips Grout PT, DPT   Huprich,Jason 03/02/2016, 10:21 AM

## 2016-03-02 NOTE — Progress Notes (Signed)
  Subjective:  POD #1  S/p left shoulder rotator cuff and labral repair.  Patient reports pain as marked.  She estimates her left shoulder pain is 7 out of 10 in intensity. She also states that she had nausea and vomiting earlier today.  Objective:   VITALS:   Vitals:   03/02/16 0559 03/02/16 0740 03/02/16 0850 03/02/16 0854  BP: 118/74 128/72 129/75 140/85  Pulse: 89 84 70 72  Resp: 19 16    Temp: 98.4 F (36.9 C) 98.2 F (36.8 C)    TempSrc: Oral Oral    SpO2: 96% 96% 100% 95%    PHYSICAL EXAM:  Left shoulder: Patient is in an abduction sling. Her dressing is clean dry and intact. She has intact sensation light touch throughout the left upper extremity with a palpable radial pulse and full digital and wrist range of motion.   LABS  No results found for this or any previous visit (from the past 24 hour(s)).  No results found.  Assessment/Plan: 1 Day Post-Op   Active Problems:   S/P rotator cuff repair  Patient continues to have pain and postop nausea. We are going to adjust her pain medication as the nausea may be related to the narcotic. I'm going to try the patient on hydrocodone.  I will add Phenergan for nausea to medications. I explained to the patient about use of the sling at home as well as use of Polar Care. She'll follow up with me in 1 week postop. If her pain and nausea improved this afternoon she may go home. If she is still requiring IV medication for pain or continues to have vomiting then she will need to stay again night.    Thornton Park , MD 03/02/2016, 1:02 PM

## 2016-03-02 NOTE — Care Management (Signed)
Hemi walker delivered to room by [facility].

## 2016-03-02 NOTE — Plan of Care (Signed)
Problem: Bowel/Gastric: Goal: Will not experience complications related to bowel motility Outcome: Progressing Pt is progressing toward discharge, has been oob to commode and recliner this shift, returned to bed at this time. Pt is taking new pain med and tolerating reasonably well. Call bell in reach.

## 2016-03-03 DIAGNOSIS — M75112 Incomplete rotator cuff tear or rupture of left shoulder, not specified as traumatic: Secondary | ICD-10-CM | POA: Diagnosis not present

## 2016-03-03 NOTE — Progress Notes (Signed)
Pt has been dc'd home, piv removed by nursing student intact, pt tolerated well. This Probation officer reviewed d/c instructions with pt and her husband, both verbalized understanding, and pt received script for hydrocodone. Pt agreeable to d/c, signed and received sopy of instructions, escorted to front entrance of facility via wc to waiting family car.

## 2016-03-03 NOTE — Progress Notes (Signed)
Shift assessment completed at 0830. Pt has received percocet for pain. anticipating discharge this am. Shoulder immobilizer intact with polar care applied to L shoulder, pt is able to move her fingers. Pt has requested bedside commode for home and case mgt aware, waiting for this to be supplied ot pt. Pt is on room air, lungs are clear bilat, S1S2 ausculatated. Abdomen is soft, bs heard. Pt is oob to commode to void prn, ppp, no edema noted,. PIV #20 intact to R arm, site si free of redness and swelling.

## 2016-03-03 NOTE — Plan of Care (Signed)
Problem: Bowel/Gastric: Goal: Will not experience complications related to bowel motility Outcome: Completed/Met Date Met: 03/03/16 Pt is dc'd home.

## 2016-03-03 NOTE — Care Management (Signed)
Patient discharging to home today with her husband. She asked about bedside commode which has been requested from [facility] with [place]. She states her hemi walker has been delivered. Corene Cornea with [facility] notified of patient's discharge to home today and need for home health. No further RNCM needs. Case closed.

## 2016-03-06 NOTE — Anesthesia Postprocedure Evaluation (Signed)
Anesthesia Post Note  Patient: Hailey Farrell  Procedure(s) Performed: Procedure(s) (LRB): SHOULDER ARTHROSCOPY WITH OPEN ROTATOR CUFF REPAIR (Left)  Patient location during evaluation: PACU Anesthesia Type: General Level of consciousness: awake and alert Pain management: pain level controlled Vital Signs Assessment: post-procedure vital signs reviewed and stable Respiratory status: spontaneous breathing, nonlabored ventilation, respiratory function stable and patient connected to nasal cannula oxygen Cardiovascular status: blood pressure returned to baseline and stable Postop Assessment: no signs of nausea or vomiting Anesthetic complications: no    Last Vitals:  Vitals:   03/03/16 0338 03/03/16 0711  BP: 136/78 (!) 159/82  Pulse: 77 (!) 102  Resp: 20 18  Temp: 37 C 36.9 C    Last Pain:  Vitals:   03/03/16 0830  TempSrc:   PainSc: Ovid Timofey Carandang

## 2016-03-22 NOTE — Discharge Summary (Signed)
Physician Discharge Summary  Patient ID: Hailey Farrell MRN: [id number] DOB/AGE: 08-20-1950 65 y.o.  Admit date: 03/01/2016 Discharge date: 03/22/2016  Admission Diagnoses:  M75.112 Incomplete rotatr-cuff tear/ruptr of l shoulder, not trauma  Discharge Diagnoses:  M75.112 Incomplete rotatr-cuff tear/ruptr of l shoulder, not trauma Active Problems:   S/P rotator cuff repair   Past Medical History:  Diagnosis Date  . Breast cancer ([place])    Right  . COPD (chronic obstructive pulmonary disease) ([place])   . Elevated lipids   . Hypertension   . Lower extremity edema   . Migraines   . Migraines   . Restless leg syndrome   . Rotator cuff injury     Surgeries: Procedure(s): SHOULDER ARTHROSCOPY WITH OPEN ROTATOR CUFF REPAIR on 03/01/2016   Consultants (if any):   Discharged Condition: Improved  Hospital Course: EMEREY YUEH is an 65 y.o. female who was admitted 03/01/2016 with a diagnosis of  M75.112 Incomplete rotatr-cuff tear/ruptr of l shoulder, not trauma and went to the operating room on 03/01/2016 and underwent a mini-open rotator cuff repair and was admitted post-op for pain management.  She experienced significant post-op pain, dizziness, nausea and vomiting requiring post-op admission. Marland Kitchen    She was given perioperative antibiotics:  Anti-infectives    Start     Dose/Rate Route Frequency Ordered Stop   03/01/16 2000  ceFAZolin (ANCEF) IVPB 2g/100 mL premix     2 g 200 mL/hr over 30 Minutes Intravenous Every 6 hours 03/01/16 1940 03/02/16 0934   03/01/16 1046  ceFAZolin (ANCEF) 2-4 GM/100ML-% IVPB    Comments:  Idamae Lusher: cabinet override      03/01/16 1046 03/01/16 1340   03/01/16 0042  ceFAZolin (ANCEF) IVPB 2g/100 mL premix     2 g 200 mL/hr over 30 Minutes Intravenous On call to O.R. 03/01/16 0042 03/01/16 1350    .  She benefited maximally from the hospital stay and there were no complications.    Recent vital signs:  Vitals:   03/03/16 0338 03/03/16 0711   BP: 136/78 (!) 159/82  Pulse: 77 (!) 102  Resp: 20 18  Temp: 98.6 F (37 C) 98.5 F (36.9 C)    Recent laboratory studies:  Lab Results  Component Value Date   HGB 13.5 02/02/2016   HGB 11.7 (L) 02/03/2015   Lab Results  Component Value Date   WBC 4.5 02/02/2016   PLT 212 02/02/2016   Lab Results  Component Value Date   INR 0.97 02/02/2016   Lab Results  Component Value Date   NA 139 02/02/2016   K 4.1 02/02/2016   CL 107 02/02/2016   CO2 26 02/02/2016   BUN 28 (H) 02/02/2016   CREATININE 0.79 02/02/2016   GLUCOSE 77 02/02/2016    Discharge Medications:     Medication List    TAKE these medications   aspirin-acetaminophen-caffeine 250-250-65 MG tablet Commonly known as:  EXCEDRIN MIGRAINE Take 2 tablets by mouth every 8 (eight) hours as needed for headache.   budesonide-formoterol 160-4.5 MCG/ACT inhaler Commonly known as:  SYMBICORT Inhale 2 puffs into the lungs 2 (two) times daily.   diphenhydrAMINE 25 mg capsule Commonly known as:  BENADRYL Take 2 capsules (50 mg total) by mouth every 6 (six) hours as needed.   furosemide 20 MG tablet Commonly known as:  LASIX Take 20 mg by mouth daily as needed.   HYDROcodone-acetaminophen 5-325 MG tablet Commonly known as:  NORCO/VICODIN Take 1-2 tablets by mouth every 4 (four) hours  as needed for moderate pain.   lisinopril 20 MG tablet Commonly known as:  PRINIVIL,ZESTRIL Take 20 mg by mouth daily.   meloxicam 15 MG tablet Commonly known as:  MOBIC Take 15 mg by mouth daily.   metoprolol succinate 50 MG 24 hr tablet Commonly known as:  TOPROL-XL Take 50 mg by mouth daily. Take with or immediately following a meal.   ondansetron 4 MG tablet Commonly known as:  ZOFRAN Take 1 tablet (4 mg total) by mouth every 8 (eight) hours as needed for nausea or vomiting.   oxyCODONE 5 MG immediate release tablet Commonly known as:  Oxy IR/ROXICODONE Take 1 tablet (5 mg total) by mouth every 4 (four) hours as  needed for severe pain.   potassium chloride 10 MEQ CR capsule Commonly known as:  MICRO-K Take 10 mEq by mouth daily.   rOPINIRole 0.5 MG tablet Commonly known as:  REQUIP Take 0.5 mg by mouth at bedtime.   simvastatin 20 MG tablet Commonly known as:  ZOCOR Take 20 mg by mouth daily at 6 PM.   SUMAtriptan 100 MG tablet Commonly known as:  IMITREX Take 100 mg by mouth every 2 (two) hours as needed for migraine. May repeat in 2 hours if headache persists or recurs.   SUMAVEL DOSEPRO 6 MG/0.5ML Sotj Generic drug:  SUMAtriptan Succinate Inject 1 mL into the skin daily as needed.   tiotropium 18 MCG inhalation capsule Commonly known as:  SPIRIVA Place 18 mcg into inhaler and inhale daily.   topiramate 25 MG tablet Commonly known as:  TOPAMAX Take 25 mg by mouth at bedtime.       Diagnostic Studies: No results found.  Disposition: 01-Home or Self Care  Discharge Instructions    Call MD / Call 911    Complete by:  As directed    If you experience chest pain or shortness of breath, CALL 911 and be transported to the hospital emergency room.  If you develope a fever above 101 F, pus (white drainage) or increased drainage or redness at the wound, or calf pain, call your surgeon's office.   Call MD / Call 911    Complete by:  As directed    If you experience chest pain or shortness of breath, CALL 911 and be transported to the hospital emergency room.  If you develope a fever above 101 F, pus (white drainage) or increased drainage or redness at the wound, or calf pain, call your surgeon's office.   Constipation Prevention    Complete by:  As directed    Drink plenty of fluids.  Prune juice may be helpful.  You may use a stool softener, such as Colace (over the counter) 100 mg twice a day.  Use MiraLax (over the counter) for constipation as needed.   Constipation Prevention    Complete by:  As directed    Drink plenty of fluids.  Prune juice may be helpful.  You may use a stool  softener, such as Colace (over the counter) 100 mg twice a day.  Use MiraLax (over the counter) for constipation as needed.   Diet - low sodium heart healthy    Complete by:  As directed    Diet general    Complete by:  As directed    Discharge instructions    Complete by:  As directed    Wear sling at all times, including sleep.  You will need to use the sling for a total of 4 weeks following  surgery.  Do not try and lift your arm away from your body for any reason.   Keep the dressing dry.  You may remove bandage in 3 days.  Leave the Steri-Strips (white medical tape) in place.  You may place additional Band-Aids over top of the Steri-Strips if you wish.  May shower once dressing is removed in 3 days.  Remove sling carefully only for showers, leaving arm down by your side while in the shower.  Make sure to take some pain medication this evening before you fall asleep, in preparation for the nerve block wearing off in the middle of the night.  If the the pain medication causes itching, or is too strong, try taking a single tablet at a time, or combining with Benadryl.  You may be most comfortable sleeping in a recliner.  If you do sleep in near bed, placed pillows behind the shoulder that have the operation to support it.   Discharge instructions    Complete by:  As directed    Wear sling at all times, including sleep.  You will need to use the sling for a total of 4 weeks following surgery.  Do not try and lift your arm away from your body for any reason.   Keep the dressing dry.  You may remove bandage in 3 days.  Leave the Steri-Strips (white medical tape) in place.  You may place additional Band-Aids over top of the Steri-Strips if you wish.  May shower once dressing is removed in 3 days.  Remove sling carefully only for showers, leaving arm down by your side while in the shower.  Make sure to take some pain medication this evening before you fall asleep, in preparation for the  nerve block wearing off in the middle of the night.  If the the pain medication causes itching, or is too strong, try taking a single tablet at a time, or combining with Benadryl.  You may be most comfortable sleeping in a recliner.  If you do sleep in near bed, placed pillows behind the shoulder that have the operation to support it.   Driving restrictions    Complete by:  As directed    No driving for 4 weeks   Driving restrictions    Complete by:  As directed    No driving for 4 weeks   Increase activity slowly as tolerated    Complete by:  As directed    Increase activity slowly as tolerated    Complete by:  As directed    Lifting restrictions    Complete by:  As directed    No lifting for 12-16 weeks   Lifting restrictions    Complete by:  As directed    No lifting for 12-16 weeks         Signed: Thornton Park ,MD 03/22/2016, 9:20 AM

## 2016-06-11 MED ORDER — LORAZEPAM 2 MG/ML IJ SOLN
INTRAMUSCULAR | Status: AC
  Filled 2016-06-11: qty 1

## 2016-06-11 MED ORDER — DIPHENHYDRAMINE HCL 50 MG/ML IJ SOLN
INTRAMUSCULAR | Status: AC
  Filled 2016-06-11: qty 1

## 2016-06-11 MED ORDER — HALOPERIDOL LACTATE 5 MG/ML IJ SOLN
INTRAMUSCULAR | Status: AC
  Filled 2016-06-11: qty 1

## 2016-07-12 DIAGNOSIS — M179 Osteoarthritis of knee, unspecified: Secondary | ICD-10-CM | POA: Insufficient documentation

## 2016-07-12 DIAGNOSIS — M75120 Complete rotator cuff tear or rupture of unspecified shoulder, not specified as traumatic: Secondary | ICD-10-CM | POA: Insufficient documentation

## 2016-07-12 DIAGNOSIS — M1710 Unilateral primary osteoarthritis, unspecified knee: Secondary | ICD-10-CM | POA: Insufficient documentation

## 2016-10-10 ENCOUNTER — Other Ambulatory Visit: Payer: Self-pay | Admitting: Orthopedic Surgery

## 2016-10-10 ENCOUNTER — Other Ambulatory Visit (HOSPITAL_COMMUNITY): Payer: Self-pay | Admitting: Physical Therapy

## 2016-10-10 DIAGNOSIS — M5412 Radiculopathy, cervical region: Secondary | ICD-10-CM

## 2016-10-10 DIAGNOSIS — M25512 Pain in left shoulder: Secondary | ICD-10-CM

## 2016-11-24 DIAGNOSIS — M4712 Other spondylosis with myelopathy, cervical region: Secondary | ICD-10-CM | POA: Insufficient documentation

## 2016-11-24 DIAGNOSIS — J449 Chronic obstructive pulmonary disease, unspecified: Secondary | ICD-10-CM | POA: Insufficient documentation

## 2016-11-24 DIAGNOSIS — Z720 Tobacco use: Secondary | ICD-10-CM | POA: Insufficient documentation

## 2017-04-11 ENCOUNTER — Inpatient Hospital Stay
Admission: EM | Admit: 2017-04-11 | Discharge: 2017-04-13 | DRG: 378 | Disposition: A | Discharge status: Home / Self Care | Payer: Medicare HMO | Attending: Internal Medicine | Admitting: Internal Medicine

## 2017-04-11 ENCOUNTER — Other Ambulatory Visit: Payer: Self-pay

## 2017-04-11 ENCOUNTER — Encounter: Payer: Self-pay | Admitting: Emergency Medicine

## 2017-04-11 DIAGNOSIS — R600 Localized edema: Secondary | ICD-10-CM | POA: Diagnosis present

## 2017-04-11 DIAGNOSIS — D649 Anemia, unspecified: Secondary | ICD-10-CM

## 2017-04-11 DIAGNOSIS — Z96652 Presence of left artificial knee joint: Secondary | ICD-10-CM | POA: Diagnosis present

## 2017-04-11 DIAGNOSIS — E785 Hyperlipidemia, unspecified: Secondary | ICD-10-CM | POA: Diagnosis present

## 2017-04-11 DIAGNOSIS — K269 Duodenal ulcer, unspecified as acute or chronic, without hemorrhage or perforation: Secondary | ICD-10-CM | POA: Diagnosis not present

## 2017-04-11 DIAGNOSIS — D62 Acute posthemorrhagic anemia: Secondary | ICD-10-CM | POA: Diagnosis present

## 2017-04-11 DIAGNOSIS — Z791 Long term (current) use of non-steroidal anti-inflammatories (NSAID): Secondary | ICD-10-CM

## 2017-04-11 DIAGNOSIS — R402412 Glasgow coma scale score 13-15, at arrival to emergency department: Secondary | ICD-10-CM | POA: Diagnosis present

## 2017-04-11 DIAGNOSIS — K3189 Other diseases of stomach and duodenum: Secondary | ICD-10-CM | POA: Diagnosis present

## 2017-04-11 DIAGNOSIS — K264 Chronic or unspecified duodenal ulcer with hemorrhage: Secondary | ICD-10-CM | POA: Diagnosis present

## 2017-04-11 DIAGNOSIS — Z91040 Latex allergy status: Secondary | ICD-10-CM

## 2017-04-11 DIAGNOSIS — K259 Gastric ulcer, unspecified as acute or chronic, without hemorrhage or perforation: Secondary | ICD-10-CM | POA: Diagnosis present

## 2017-04-11 DIAGNOSIS — R55 Syncope and collapse: Secondary | ICD-10-CM

## 2017-04-11 DIAGNOSIS — Z7951 Long term (current) use of inhaled steroids: Secondary | ICD-10-CM

## 2017-04-11 DIAGNOSIS — I959 Hypotension, unspecified: Secondary | ICD-10-CM | POA: Diagnosis present

## 2017-04-11 DIAGNOSIS — F17210 Nicotine dependence, cigarettes, uncomplicated: Secondary | ICD-10-CM | POA: Diagnosis present

## 2017-04-11 DIAGNOSIS — K922 Gastrointestinal hemorrhage, unspecified: Secondary | ICD-10-CM | POA: Diagnosis not present

## 2017-04-11 DIAGNOSIS — J449 Chronic obstructive pulmonary disease, unspecified: Secondary | ICD-10-CM | POA: Diagnosis present

## 2017-04-11 DIAGNOSIS — Z79899 Other long term (current) drug therapy: Secondary | ICD-10-CM | POA: Diagnosis not present

## 2017-04-11 DIAGNOSIS — Z853 Personal history of malignant neoplasm of breast: Secondary | ICD-10-CM

## 2017-04-11 DIAGNOSIS — I10 Essential (primary) hypertension: Secondary | ICD-10-CM | POA: Diagnosis present

## 2017-04-11 DIAGNOSIS — G43909 Migraine, unspecified, not intractable, without status migrainosus: Secondary | ICD-10-CM | POA: Diagnosis present

## 2017-04-11 DIAGNOSIS — K253 Acute gastric ulcer without hemorrhage or perforation: Secondary | ICD-10-CM | POA: Diagnosis not present

## 2017-04-11 DIAGNOSIS — G2581 Restless legs syndrome: Secondary | ICD-10-CM | POA: Diagnosis present

## 2017-04-11 LAB — BASIC METABOLIC PANEL
ANION GAP: 7 (ref 5–15)
BUN: 23 mg/dL — ABNORMAL HIGH (ref 6–20)
CALCIUM: 9.1 mg/dL (ref 8.9–10.3)
CHLORIDE: 102 mmol/L (ref 101–111)
CO2: 26 mmol/L (ref 22–32)
CREATININE: 1.05 mg/dL — AB (ref 0.44–1.00)
GFR calc non Af Amer: 54 mL/min — ABNORMAL LOW (ref >60)
Glucose, Bld: 101 mg/dL — ABNORMAL HIGH (ref 65–99)
Potassium: 3.9 mmol/L (ref 3.5–5.1)
SODIUM: 135 mmol/L (ref 135–145)

## 2017-04-11 LAB — URINALYSIS, COMPLETE (UACMP) WITH MICROSCOPIC
Bacteria, UA: NONE SEEN
Bilirubin Urine: NEGATIVE
GLUCOSE, UA: NEGATIVE mg/dL
HGB URINE DIPSTICK: NEGATIVE
KETONES UR: NEGATIVE mg/dL
Nitrite: NEGATIVE
PROTEIN: NEGATIVE mg/dL
Specific Gravity, Urine: 1.005 (ref 1.005–1.030)
pH: 6 (ref 5.0–8.0)

## 2017-04-11 LAB — CBC
HCT: 22 % — ABNORMAL LOW (ref 35.0–47.0)
HEMOGLOBIN: 6.9 g/dL — AB (ref 12.0–16.0)
MCH: 22 pg — AB (ref 26.0–34.0)
MCHC: 31.3 g/dL — ABNORMAL LOW (ref 32.0–36.0)
MCV: 70.4 fL — AB (ref 80.0–100.0)
PLATELETS: 457 10*3/uL — AB (ref 150–440)
RBC: 3.12 MIL/uL — AB (ref 3.80–5.20)
RDW: 17.4 % — ABNORMAL HIGH (ref 11.5–14.5)
WBC: 6.4 10*3/uL (ref 3.6–11.0)

## 2017-04-11 LAB — GLUCOSE, CAPILLARY: GLUCOSE-CAPILLARY: 97 mg/dL (ref 65–99)

## 2017-04-11 LAB — ABO/RH: ABO/RH(D): O POS

## 2017-04-11 LAB — PREPARE RBC (CROSSMATCH)

## 2017-04-11 MED ORDER — SODIUM CHLORIDE 0.9 % IV BOLUS (SEPSIS)
1000.0000 mL | Freq: Once | INTRAVENOUS | Status: AC
  Administered 2017-04-11: 1000 mL via INTRAVENOUS

## 2017-04-11 MED ORDER — ACETAMINOPHEN 650 MG RE SUPP: 650.0000 mg | Freq: Four times a day (QID) | RECTAL | Status: DC | PRN

## 2017-04-11 MED ORDER — SODIUM CHLORIDE 0.9 % IV SOLN
80.0000 mg | Freq: Once | INTRAVENOUS | Status: AC
  Administered 2017-04-11: 80 mg via INTRAVENOUS
  Filled 2017-04-11: qty 80

## 2017-04-11 MED ORDER — ROPINIROLE HCL 0.5 MG PO TABS: 0.5000 mg | ORAL_TABLET | Freq: Every day | ORAL | Status: DC

## 2017-04-11 MED ORDER — LISINOPRIL 20 MG PO TABS
20.0000 mg | ORAL_TABLET | Freq: Every day | ORAL | Status: DC
  Administered 2017-04-12 – 2017-04-13 (×2): 20 mg via ORAL
  Filled 2017-04-11: qty 1
  Filled 2017-04-11: qty 2

## 2017-04-11 MED ORDER — METOPROLOL SUCCINATE ER 50 MG PO TB24
50.0000 mg | ORAL_TABLET | Freq: Every day | ORAL | Status: DC
  Administered 2017-04-13: 50 mg via ORAL
  Filled 2017-04-11 (×2): qty 1

## 2017-04-11 MED ORDER — ACETAMINOPHEN 325 MG PO TABS: 650.0000 mg | ORAL_TABLET | Freq: Four times a day (QID) | ORAL | Status: DC | PRN

## 2017-04-11 MED ORDER — IPRATROPIUM-ALBUTEROL 0.5-2.5 (3) MG/3ML IN SOLN
RESPIRATORY_TRACT | Status: AC
  Administered 2017-04-11: 3 mL via RESPIRATORY_TRACT
  Filled 2017-04-11: qty 3

## 2017-04-11 MED ORDER — SODIUM CHLORIDE 0.9 % IV SOLN
8.0000 mg/h | INTRAVENOUS | Status: DC
  Administered 2017-04-11: 8 mg/h via INTRAVENOUS
  Filled 2017-04-11: qty 80

## 2017-04-11 MED ORDER — SIMVASTATIN 20 MG PO TABS
20.0000 mg | ORAL_TABLET | Freq: Every day | ORAL | Status: DC
  Administered 2017-04-12: 20 mg via ORAL
  Filled 2017-04-11 (×2): qty 1

## 2017-04-11 MED ORDER — SUMATRIPTAN SUCCINATE 6 MG/0.5ML ~~LOC~~ SOLN
6.0000 mg | SUBCUTANEOUS | Status: DC | PRN
  Filled 2017-04-11: qty 0.5

## 2017-04-11 MED ORDER — ASPIRIN-ACETAMINOPHEN-CAFFEINE 250-250-65 MG PO TABS
2.0000 | ORAL_TABLET | Freq: Three times a day (TID) | ORAL | Status: DC | PRN
  Administered 2017-04-12: 2 via ORAL
  Filled 2017-04-11 (×2): qty 2

## 2017-04-11 MED ORDER — ROPINIROLE HCL 1 MG PO TABS
0.5000 mg | ORAL_TABLET | Freq: Every day | ORAL | Status: DC
  Administered 2017-04-11 – 2017-04-12 (×2): 0.5 mg via ORAL
  Filled 2017-04-11 (×3): qty 1

## 2017-04-11 MED ORDER — ALBUTEROL SULFATE (2.5 MG/3ML) 0.083% IN NEBU
5.0000 mg | INHALATION_SOLUTION | Freq: Once | RESPIRATORY_TRACT | Status: AC
  Administered 2017-04-11: 5 mg via RESPIRATORY_TRACT
  Filled 2017-04-11: qty 6

## 2017-04-11 MED ORDER — PANTOPRAZOLE SODIUM 40 MG IV SOLR: 40.0000 mg | Freq: Two times a day (BID) | INTRAVENOUS | Status: DC

## 2017-04-11 MED ORDER — IPRATROPIUM-ALBUTEROL 0.5-2.5 (3) MG/3ML IN SOLN
3.0000 mL | Freq: Four times a day (QID) | RESPIRATORY_TRACT | Status: DC
  Administered 2017-04-11 – 2017-04-12 (×3): 3 mL via RESPIRATORY_TRACT
  Filled 2017-04-11 (×2): qty 3

## 2017-04-11 MED ORDER — SODIUM CHLORIDE 0.9 % IV SOLN
10.0000 mL/h | Freq: Once | INTRAVENOUS | Status: AC
  Administered 2017-04-11: 10 mL/h via INTRAVENOUS

## 2017-04-11 MED ORDER — POLYETHYLENE GLYCOL 3350 17 G PO PACK: 17.0000 g | PACK | Freq: Every day | ORAL | Status: DC | PRN

## 2017-04-11 MED ORDER — TIOTROPIUM BROMIDE MONOHYDRATE 18 MCG IN CAPS
18.0000 ug | ORAL_CAPSULE | Freq: Every day | RESPIRATORY_TRACT | Status: DC
  Administered 2017-04-12 – 2017-04-13 (×2): 18 ug via RESPIRATORY_TRACT
  Filled 2017-04-11: qty 5

## 2017-04-11 MED ORDER — MOMETASONE FURO-FORMOTEROL FUM 200-5 MCG/ACT IN AERO
2.0000 | INHALATION_SPRAY | Freq: Two times a day (BID) | RESPIRATORY_TRACT | Status: DC
  Administered 2017-04-11 – 2017-04-13 (×4): 2 via RESPIRATORY_TRACT
  Filled 2017-04-11: qty 8.8

## 2017-04-11 MED ORDER — IPRATROPIUM-ALBUTEROL 0.5-2.5 (3) MG/3ML IN SOLN: 3.0000 mL | Freq: Four times a day (QID) | RESPIRATORY_TRACT | Status: DC

## 2017-04-11 NOTE — ED Provider Notes (Signed)
[facility] Emergency Department Provider Note ____________________________________________   I have reviewed the triage vital signs and the triage nursing note.  HISTORY  Chief Complaint Near Syncope; Shortness of Breath; and Hypotension   Historian Patient  HPI Hailey Farrell is a 66 y.o. female presenting with fatigue for several days, dyspnea with exertion, no chest pain, dizziness upon standing.  She does take her blood pressure and noted this morning that her blood pressure was 60 over something.  She states EMS came out to the hospital blood pressure was better so she did not come immediately.  However she continued to feel badly especially when she stood up so came in for evaluation.  No prior history of GI bleeding.  No history of prior blood transfusions.  Was moderate to severe.  Standing makes it worse.   Past Medical History:  Diagnosis Date  . Breast cancer ([place])    Right  . COPD (chronic obstructive pulmonary disease) ([place])   . Elevated lipids   . Hypertension   . Lower extremity edema   . Migraines   . Migraines   . Restless leg syndrome   . Rotator cuff injury     Patient Active Problem List   Diagnosis Date Noted  . GIB (gastrointestinal bleeding) 04/11/2017  . S/P rotator cuff repair 03/01/2016    Past Surgical History:  Procedure Laterality Date  . ABDOMINAL HYSTERECTOMY    . BREAST BIOPSY Right   . JOINT REPLACEMENT     Lt knee  . MEDIAL PARTIAL KNEE REPLACEMENT    . PARTIAL HYSTERECTOMY      Prior to Admission medications   Medication Sig Start Date End Date Taking? Authorizing Provider  budesonide-formoterol (SYMBICORT) 160-4.5 MCG/ACT inhaler Inhale 2 puffs daily into the lungs.    Yes [provider]  furosemide (LASIX) 20 MG tablet Take 20 mg daily by mouth.    Yes [provider]  lisinopril (PRINIVIL,ZESTRIL) 20 MG tablet Take 20 mg by mouth daily.   Yes [provider]  meloxicam  (MOBIC) 15 MG tablet Take 15 mg by mouth daily.   Yes [provider]  metoprolol succinate (TOPROL-XL) 50 MG 24 hr tablet Take 50 mg by mouth daily. Take with or immediately following a meal.   Yes [provider]  potassium chloride (MICRO-K) 10 MEQ CR capsule Take 10 mEq by mouth daily.   Yes [provider]  rOPINIRole (REQUIP) 0.5 MG tablet Take 0.5 mg by mouth at bedtime.   Yes [provider]  simvastatin (ZOCOR) 20 MG tablet Take 20 mg by mouth daily at 6 PM.   Yes [provider]  SUMAtriptan (IMITREX) 6 MG/0.5ML SOLN injection Inject 6 mg every 2 (two) hours as needed into the skin for migraine or headache. May repeat in 2 hours if headache persists or recurs.   Yes [provider]  tiotropium (SPIRIVA) 18 MCG inhalation capsule Place 18 mcg into inhaler and inhale daily.   Yes [provider]  aspirin-acetaminophen-caffeine (EXCEDRIN MIGRAINE) [phone] MG tablet Take 2 tablets by mouth every 8 (eight) hours as needed for headache.    [provider]  diphenhydrAMINE (BENADRYL) 25 mg capsule Take 2 capsules (50 mg total) by mouth every 6 (six) hours as needed. Patient not taking: Reported on 03/01/2016 03/17/15   Carrie Mew, MD  HYDROcodone-acetaminophen (NORCO/VICODIN) 5-325 MG tablet Take 1-2 tablets by mouth every 4 (four) hours as needed for moderate pain. Patient not taking: Reported on  04/11/2017 03/02/16   Thornton Park, MD  ondansetron (ZOFRAN) 4 MG tablet Take 1 tablet (4 mg total) by mouth every 8 (eight) hours as needed for nausea or vomiting. Patient not taking: Reported on 04/11/2017 03/01/16   Thornton Park, MD  oxyCODONE (OXY IR/ROXICODONE) 5 MG immediate release tablet Take 1 tablet (5 mg total) by mouth every 4 (four) hours as needed for severe pain. 03/01/16   Thornton Park, MD  SUMAtriptan Succinate (SUMAVEL DOSEPRO) 6 MG/0.5ML SOTJ Inject 1 mL into the skin daily as needed.    [provider]    Allergies  Allergen Reactions  . Latex Rash    History reviewed. No pertinent family history.  Social History Social History   Tobacco Use  . Smoking status: Current Some Day Smoker    Packs/day: 0.25    Years: 50.00    Pack years: 12.50    Types: Cigarettes  . Smokeless tobacco: Never Used  Substance Use Topics  . Alcohol use: No  . Drug use: No    Review of Systems  Constitutional: Negative for fever. Eyes: Negative for visual changes. ENT: Negative for sore throat. Cardiovascular: Negative for chest pain. Respiratory: Negative for shortness of breath. Gastrointestinal: Negative for abdominal pain, vomiting and diarrhea. Genitourinary: Negative for dysuria. Musculoskeletal: Negative for back pain. Skin: Negative for rash. Neurological: Negative for headache.  ____________________________________________   PHYSICAL EXAM:  VITAL SIGNS: ED Triage Vitals  Enc Vitals Group     BP 04/11/17 1408 110/73     Pulse Rate 04/11/17 1408 (!) 105     Resp 04/11/17 1408 18     Temp 04/11/17 1408 98.3 F (36.8 C)     Temp Source 04/11/17 1408 Oral     SpO2 04/11/17 1408 100 %     Weight 04/11/17 1409 184 lb (83.5 kg)     Height 04/11/17 1409 5\' 3"  (1.6 m)     Head Circumference --      Peak Flow --      Pain Score 04/11/17 1408 5     Pain Loc --      Pain Edu? --      Excl. in [place]? --      Constitutional: Alert and oriented. Well appearing and in no distress. HEENT   Head: Normocephalic and atraumatic.      Eyes: Conjunctivae are normal. Pupils equal and round.       Ears:         Nose: No congestion/rhinnorhea.   Mouth/Throat: Mucous membranes are moist.   Neck: No stridor. Cardiovascular/Chest: Normal rate, regular rhythm.  No murmurs, rubs, or gallops. Respiratory: Normal respiratory effort without tachypnea nor retractions. Breath sounds are clear and equal bilaterally. No wheezes/rales/rhonchi. Gastrointestinal: Soft. No  distention, no guarding, no rebound. Nontender.    Genitourinary/rectal: Brown stool, strongly heme positive Musculoskeletal: Nontender with normal range of motion in all extremities. No joint effusions.  No lower extremity tenderness.  No edema. Neurologic:  Normal speech and language. No gross or focal neurologic deficits are appreciated. Skin:  Skin is warm, dry and intact. No rash noted. Psychiatric: Mood and affect are normal. Speech and behavior are normal. Patient exhibits appropriate insight and judgment.   ____________________________________________  LABS (pertinent positives/negatives) I, Lisa Roca, MD the attending physician have reviewed the labs noted below.  Labs Reviewed  BASIC METABOLIC PANEL - Abnormal; Notable for the following components:      Result Value   Glucose, Bld 101 (*)  BUN 23 (*)    Creatinine, Ser 1.05 (*)    GFR calc non Af Amer 54 (*)    All other components within normal limits  CBC - Abnormal; Notable for the following components:   RBC 3.12 (*)    Hemoglobin 6.9 (*)    HCT 22.0 (*)    MCV 70.4 (*)    MCH 22.0 (*)    MCHC 31.3 (*)    RDW 17.4 (*)    Platelets 457 (*)    All other components within normal limits  URINALYSIS, COMPLETE (UACMP) WITH MICROSCOPIC - Abnormal; Notable for the following components:   Color, Urine YELLOW (*)    APPearance CLEAR (*)    Leukocytes, UA MODERATE (*)    Squamous Epithelial / LPF 0-5 (*)    All other components within normal limits  IRON AND TIBC - Abnormal; Notable for the following components:   Iron 13 (*)    Saturation Ratios 4 (*)    All other components within normal limits  FERRITIN - Abnormal; Notable for the following components:   Ferritin 6 (*)    All other components within normal limits  RETICULOCYTES - Abnormal; Notable for the following components:   Retic Ct Pct 5.6 (*)    RBC. 2.51 (*)    All other components within normal limits  BASIC METABOLIC PANEL - Abnormal; Notable for  the following components:   Chloride 112 (*)    Glucose, Bld 103 (*)    Calcium 8.4 (*)    All other components within normal limits  CBC - Abnormal; Notable for the following components:   RBC 2.95 (*)    Hemoglobin 7.1 (*)    HCT 21.8 (*)    MCV 73.9 (*)    MCH 23.9 (*)    RDW 20.6 (*)    All other components within normal limits  HEMOGLOBIN - Abnormal; Notable for the following components:   Hemoglobin 8.0 (*)    All other components within normal limits  GLUCOSE, CAPILLARY - Abnormal; Notable for the following components:   Glucose-Capillary 111 (*)    All other components within normal limits  GLUCOSE, CAPILLARY  FOLATE  VITAMIN B12  HEMOGLOBIN  HEMOGLOBIN  VITAMIN B12  CBG MONITORING, ED  TYPE AND SCREEN  PREPARE RBC (CROSSMATCH)  ABO/RH  PREPARE RBC (CROSSMATCH)    ____________________________________________    EKG I, Lisa Roca, MD, the attending physician have personally viewed and interpreted all ECGs.  99 bpm.  Normal sinus rhythm.  Narrow QRS, normal axis.  Nonspecific ST and T wave ____________________________________________  RADIOLOGY All Xrays were viewed by me.  Imaging interpreted by Radiologist, and I, Lisa Roca, MD the attending physician have reviewed the radiologist interpretation noted below.  None __________________________________________  PROCEDURES  Procedure(s) performed: None  Critical Care performed: None  ____________________________________________  No current facility-administered medications on file prior to encounter.    Current Outpatient Medications on File Prior to Encounter  Medication Sig Dispense Refill  . budesonide-formoterol (SYMBICORT) 160-4.5 MCG/ACT inhaler Inhale 2 puffs daily into the lungs.     . furosemide (LASIX) 20 MG tablet Take 20 mg daily by mouth.     Marland Kitchen lisinopril (PRINIVIL,ZESTRIL) 20 MG tablet Take 20 mg by mouth daily.    . meloxicam (MOBIC) 15 MG tablet Take 15 mg by mouth daily.    .  metoprolol succinate (TOPROL-XL) 50 MG 24 hr tablet Take 50 mg by mouth daily. Take with or immediately following a meal.    .  potassium chloride (MICRO-K) 10 MEQ CR capsule Take 10 mEq by mouth daily.    Marland Kitchen rOPINIRole (REQUIP) 0.5 MG tablet Take 0.5 mg by mouth at bedtime.    . simvastatin (ZOCOR) 20 MG tablet Take 20 mg by mouth daily at 6 PM.    . SUMAtriptan (IMITREX) 6 MG/0.5ML SOLN injection Inject 6 mg every 2 (two) hours as needed into the skin for migraine or headache. May repeat in 2 hours if headache persists or recurs.    Marland Kitchen tiotropium (SPIRIVA) 18 MCG inhalation capsule Place 18 mcg into inhaler and inhale daily.    Marland Kitchen aspirin-acetaminophen-caffeine (EXCEDRIN MIGRAINE) 250-250-65 MG tablet Take 2 tablets by mouth every 8 (eight) hours as needed for headache.    . diphenhydrAMINE (BENADRYL) 25 mg capsule Take 2 capsules (50 mg total) by mouth every 6 (six) hours as needed. (Patient not taking: Reported on 03/01/2016) 60 capsule 0  . HYDROcodone-acetaminophen (NORCO/VICODIN) 5-325 MG tablet Take 1-2 tablets by mouth every 4 (four) hours as needed for moderate pain. (Patient not taking: Reported on 04/11/2017) 60 tablet 0  . ondansetron (ZOFRAN) 4 MG tablet Take 1 tablet (4 mg total) by mouth every 8 (eight) hours as needed for nausea or vomiting. (Patient not taking: Reported on 04/11/2017) 30 tablet 0  . oxyCODONE (OXY IR/ROXICODONE) 5 MG immediate release tablet Take 1 tablet (5 mg total) by mouth every 4 (four) hours as needed for severe pain. 60 tablet 0  . SUMAtriptan Succinate (SUMAVEL DOSEPRO) 6 MG/0.5ML SOTJ Inject 1 mL into the skin daily as needed.      ____________________________________________  ED COURSE / ASSESSMENT AND PLAN  Pertinent labs & imaging results that were available during my care of the patient were reviewed by me and considered in my medical decision making (see chart for details).    Patient found to be significantly anemic, with significant drop.  She had  not noticed dark or bloody stools, however strongly positive.  We discussed likely GI bleed and will cover with Protonix bolus and drip.  Patient was discussed and consented for blood, and unit was written for transfusion.  Discussed with hospitalist for admission.  DIFFERENTIAL DIAGNOSIS: Including but not limited to dehydration, renal failure, Electra light disturbance, UTI, anemia, arrhythmia, etc.  CONSULTATIONS:   Hospitalist for admission.   Patient / Family / Caregiver informed of clinical course, medical decision-making process, and agree with plan.  ___________________________________________   FINAL CLINICAL IMPRESSION(S) / ED DIAGNOSES   Final diagnoses:  Near syncope  Symptomatic anemia              Note: This dictation was prepared with Dragon dictation. Any transcriptional errors that result from this process are unintentional    Lisa Roca, MD 04/12/17 2008

## 2017-04-11 NOTE — ED Notes (Signed)
Protonix unavailable at this time, pharmacy notified, states medication is being mixed now.

## 2017-04-11 NOTE — ED Notes (Signed)
Pt provided turkey sandwich tray upon request. 

## 2017-04-11 NOTE — ED Triage Notes (Signed)
Patient presents to the ED with increasing weakness over the past 2 weeks with episodes of hypotension and feeling like she is going to pass out.  Patient reports a blood pressure that was systolic in the 40J this morning.  Patient states she called EMS out to her house.  Patient also reports some shortness of breath from her COPD.  Patient states, "I think I need a breathing treatment."  Patient is post-neck surgery from July and wears a brace.

## 2017-04-11 NOTE — H&P (Signed)
[facility] [place] NAME: Hailey Farrell    MR#:  [id number]  DATE OF BIRTH:  08-05-1950  DATE OF ADMISSION:  04/11/2017  PRIMARY CARE PHYSICIAN: Letta Median, MD   REQUESTING/REFERRING PHYSICIAN: dr Reita Cliche  CHIEF COMPLAINT:   SOB and weakness HISTORY OF PRESENT ILLNESS:  Hailey Farrell  is a 66 y.o. female with a known history of  COPD and HTN who presents due to weakness. Patient reports over the past 2-3 days she has had increasing weakness associated with shortness of breath. She denies chest pain. She has also had dark stools. In the emergency room she was noted to have a hemoglobin of 6.9. She is quite positive. She is consented for blood transfusion. She does not take NSAIDs. She denies abdominal pain.  PAST MEDICAL HISTORY:   Past Medical History:  Diagnosis Date  . Breast cancer ([place])    Right  . COPD (chronic obstructive pulmonary disease) ([place])   . Elevated lipids   . Hypertension   . Lower extremity edema   . Migraines   . Migraines   . Restless leg syndrome   . Rotator cuff injury     PAST SURGICAL HISTORY:   Past Surgical History:  Procedure Laterality Date  . ABDOMINAL HYSTERECTOMY    . BREAST BIOPSY Right   . JOINT REPLACEMENT     Lt knee  . MEDIAL PARTIAL KNEE REPLACEMENT    . PARTIAL HYSTERECTOMY      SOCIAL HISTORY:   Social History   Tobacco Use  . Smoking status: Current Some Day Smoker    Packs/day: 0.25    Years: 50.00    Pack years: 12.50    Types: Cigarettes  . Smokeless tobacco: Never Used  Substance Use Topics  . Alcohol use: No    FAMILY HISTORY:  No family history on file.  DRUG ALLERGIES:   Allergies  Allergen Reactions  . Latex Rash    REVIEW OF SYSTEMS:   Review of Systems  Constitutional: Positive for malaise/fatigue. Negative for chills and fever.  HENT: Negative.  Negative for ear discharge, ear pain, hearing loss, nosebleeds and sore throat.   Eyes: Negative.   Negative for blurred vision and pain.  Respiratory: Positive for shortness of breath. Negative for cough, hemoptysis and wheezing.   Cardiovascular: Negative.  Negative for chest pain, palpitations and leg swelling.  Gastrointestinal: Positive for melena. Negative for abdominal pain, blood in stool, diarrhea, nausea and vomiting.  Genitourinary: Negative.  Negative for dysuria.  Musculoskeletal: Negative.  Negative for back pain.  Skin: Negative.   Neurological: Positive for weakness. Negative for dizziness, tremors, speech change, focal weakness, seizures and headaches.  Endo/Heme/Allergies: Negative.  Does not bruise/bleed easily.  Psychiatric/Behavioral: Negative.  Negative for depression, hallucinations and suicidal ideas.    MEDICATIONS AT HOME:   Prior to Admission medications   Medication Sig Start Date End Date Taking? Authorizing Provider  budesonide-formoterol (SYMBICORT) 160-4.5 MCG/ACT inhaler Inhale 2 puffs daily into the lungs.    Yes [provider]  furosemide (LASIX) 20 MG tablet Take 20 mg daily by mouth.    Yes [provider]  lisinopril (PRINIVIL,ZESTRIL) 20 MG tablet Take 20 mg by mouth daily.   Yes [provider]  meloxicam (MOBIC) 15 MG tablet Take 15 mg by mouth daily.   Yes [provider]  metoprolol succinate (TOPROL-XL) 50 MG 24 hr tablet Take 50 mg by mouth daily. Take with or immediately following  a meal.   Yes [provider]  potassium chloride (MICRO-K) 10 MEQ CR capsule Take 10 mEq by mouth daily.   Yes [provider]  rOPINIRole (REQUIP) 0.5 MG tablet Take 0.5 mg by mouth at bedtime.   Yes [provider]  simvastatin (ZOCOR) 20 MG tablet Take 20 mg by mouth daily at 6 PM.   Yes [provider]  SUMAtriptan (IMITREX) 6 MG/0.5ML SOLN injection Inject 6 mg every 2 (two) hours as needed into the skin for migraine or headache. May repeat in 2 hours if headache persists or recurs.   Yes  [provider]  tiotropium (SPIRIVA) 18 MCG inhalation capsule Place 18 mcg into inhaler and inhale daily.   Yes [provider]  aspirin-acetaminophen-caffeine (EXCEDRIN MIGRAINE) [phone] MG tablet Take 2 tablets by mouth every 8 (eight) hours as needed for headache.    [provider]  diphenhydrAMINE (BENADRYL) 25 mg capsule Take 2 capsules (50 mg total) by mouth every 6 (six) hours as needed. Patient not taking: Reported on 03/01/2016 03/17/15   Carrie Mew, MD  HYDROcodone-acetaminophen (NORCO/VICODIN) 5-325 MG tablet Take 1-2 tablets by mouth every 4 (four) hours as needed for moderate pain. Patient not taking: Reported on 04/11/2017 03/02/16   Thornton Park, MD  ondansetron (ZOFRAN) 4 MG tablet Take 1 tablet (4 mg total) by mouth every 8 (eight) hours as needed for nausea or vomiting. Patient not taking: Reported on 04/11/2017 03/01/16   Thornton Park, MD  oxyCODONE (OXY IR/ROXICODONE) 5 MG immediate release tablet Take 1 tablet (5 mg total) by mouth every 4 (four) hours as needed for severe pain. 03/01/16   Thornton Park, MD  SUMAtriptan Succinate (SUMAVEL DOSEPRO) 6 MG/0.5ML SOTJ Inject 1 mL into the skin daily as needed.    [provider]      VITAL SIGNS:  Blood pressure 118/70, pulse 96, temperature 98.3 F (36.8 C), temperature source Oral, resp. rate 16, height 5\' 3"  (1.6 m), weight 83.5 kg (184 lb), SpO2 99 %.  PHYSICAL EXAMINATION:   Physical Exam  Constitutional: She is oriented to person, place, and time and well-developed, well-nourished, and in no distress. No distress.  HENT:  Head: Normocephalic.  Eyes: No scleral icterus.  Neck: Normal range of motion. Neck supple. No JVD present. No tracheal deviation present.  Cardiovascular: Normal rate, regular rhythm and normal heart sounds. Exam reveals no gallop and no friction rub.  No murmur heard. Pulmonary/Chest: Effort normal and breath sounds normal. No respiratory  distress. She has no wheezes. She has no rales. She exhibits no tenderness.  Abdominal: Soft. Bowel sounds are normal. She exhibits no distension and no mass. There is no tenderness. There is no rebound and no guarding.  Musculoskeletal: Normal range of motion. She exhibits edema (chronic mild).  Neurological: She is alert and oriented to person, place, and time.  Skin: Skin is warm. No rash noted. No erythema.  Psychiatric: Affect and judgment normal.      LABORATORY PANEL:   CBC Recent Labs  Lab 04/11/17 1407  WBC 6.4  HGB 6.9*  HCT 22.0*  PLT 457*   ------------------------------------------------------------------------------------------------------------------  Chemistries  Recent Labs  Lab 04/11/17 1407  NA 135  K 3.9  CL 102  CO2 26  GLUCOSE 101*  BUN 23*  CREATININE 1.05*  CALCIUM 9.1   ------------------------------------------------------------------------------------------------------------------  Cardiac Enzymes No results for input(s): TROPONINI in the last 168 hours. ------------------------------------------------------------------------------------------------------------------  RADIOLOGY:  No results found.  EKG:  Normal sinus rhythm  LVH no ST elevation or depression  IMPRESSION AND PLAN:   66 year old female with history of hypertension who presents with shortness of breath and found to have anemia.  1. Acute blood loss anemia, likely from GI blood loss: Transfuse 1 unit PRBCs Order anemia panel Follow CBC GI consultation for possible EGD in a.m. due to melena and guaiac positive stools Continue PPI   2. HTN: Due to low BP I will hold all blood pressure medications.  3. COPD without signs of exacerbation DUONEBS PRN  4. Hyperlipidemia: Continue simvastatin  All the records are reviewed and case discussed with ED provider. Management plans discussed with the patient and she is in agreement  CODE STATUS: FULL  TOTAL TIME TAKING  CARE OF THIS PATIENT: 41 minutes.    Cira Deyoe M.D on 04/11/2017 at 8:11 PM  Between 7am to 6pm - Pager - [phone]  After 6pm go to [email or web address] - password [email or web address] [facility]  Office  [phone]  CC: Primary care physician; Letta Median, MD

## 2017-04-12 DIAGNOSIS — Z791 Long term (current) use of non-steroidal anti-inflammatories (NSAID): Secondary | ICD-10-CM

## 2017-04-12 DIAGNOSIS — D649 Anemia, unspecified: Secondary | ICD-10-CM

## 2017-04-12 DIAGNOSIS — K922 Gastrointestinal hemorrhage, unspecified: Secondary | ICD-10-CM

## 2017-04-12 LAB — CBC
HCT: 21.8 % — ABNORMAL LOW (ref 35.0–47.0)
Hemoglobin: 7.1 g/dL — ABNORMAL LOW (ref 12.0–16.0)
MCH: 23.9 pg — AB (ref 26.0–34.0)
MCHC: 32.4 g/dL (ref 32.0–36.0)
MCV: 73.9 fL — ABNORMAL LOW (ref 80.0–100.0)
PLATELETS: 389 10*3/uL (ref 150–440)
RBC: 2.95 MIL/uL — ABNORMAL LOW (ref 3.80–5.20)
RDW: 20.6 % — ABNORMAL HIGH (ref 11.5–14.5)
WBC: 5.5 10*3/uL (ref 3.6–11.0)

## 2017-04-12 LAB — BASIC METABOLIC PANEL
Anion gap: 5 (ref 5–15)
BUN: 16 mg/dL (ref 6–20)
CALCIUM: 8.4 mg/dL — AB (ref 8.9–10.3)
CO2: 24 mmol/L (ref 22–32)
Chloride: 112 mmol/L — ABNORMAL HIGH (ref 101–111)
Creatinine, Ser: 0.93 mg/dL (ref 0.44–1.00)
GFR calc Af Amer: >60 mL/min (ref >60)
GLUCOSE: 103 mg/dL — AB (ref 65–99)
Potassium: 3.6 mmol/L (ref 3.5–5.1)
Sodium: 141 mmol/L (ref 135–145)

## 2017-04-12 LAB — IRON AND TIBC
IRON: 13 ug/dL — AB (ref 28–170)
Saturation Ratios: 4 % — ABNORMAL LOW (ref 10.4–31.8)
TIBC: 360 ug/dL (ref 250–450)
UIBC: 347 ug/dL

## 2017-04-12 LAB — HEMOGLOBIN
Hemoglobin: 8 g/dL — ABNORMAL LOW (ref 12.0–16.0)
Hemoglobin: 8.1 g/dL — ABNORMAL LOW (ref 12.0–16.0)

## 2017-04-12 LAB — VITAMIN B12: Vitamin B-12: 390 pg/mL (ref 180–914)

## 2017-04-12 LAB — FERRITIN: FERRITIN: 6 ng/mL — AB (ref 11–307)

## 2017-04-12 LAB — RETICULOCYTES
RBC.: 2.51 MIL/uL — ABNORMAL LOW (ref 3.80–5.20)
RETIC COUNT ABSOLUTE: 140.6 10*3/uL (ref 19.0–183.0)
RETIC CT PCT: 5.6 % — AB (ref 0.4–3.1)

## 2017-04-12 LAB — GLUCOSE, CAPILLARY: Glucose-Capillary: 111 mg/dL — ABNORMAL HIGH (ref 65–99)

## 2017-04-12 LAB — FOLATE: Folate: 12.4 ng/mL (ref >5.9)

## 2017-04-12 LAB — PREPARE RBC (CROSSMATCH)

## 2017-04-12 MED ORDER — TRAMADOL HCL 50 MG PO TABS: 50.0000 mg | ORAL_TABLET | Freq: Four times a day (QID) | ORAL | Status: DC | PRN

## 2017-04-12 MED ORDER — SUMATRIPTAN SUCCINATE 6 MG/0.5ML ~~LOC~~ SOLN
6.0000 mg | SUBCUTANEOUS | Status: DC | PRN
  Filled 2017-04-12: qty 0.5

## 2017-04-12 MED ORDER — BOOST / RESOURCE BREEZE PO LIQD
1.0000 | Freq: Three times a day (TID) | ORAL | Status: DC
Start: 2017-04-12 — End: 2017-04-13
  Administered 2017-04-12: 1 via ORAL

## 2017-04-12 MED ORDER — IPRATROPIUM-ALBUTEROL 0.5-2.5 (3) MG/3ML IN SOLN: 3.0000 mL | Freq: Four times a day (QID) | RESPIRATORY_TRACT | Status: DC | PRN

## 2017-04-12 MED ORDER — ROPINIROLE HCL 1 MG PO TABS
0.5000 mg | ORAL_TABLET | Freq: Every evening | ORAL | Status: DC | PRN
  Administered 2017-04-12 – 2017-04-13 (×2): 0.5 mg via ORAL
  Filled 2017-04-12 (×2): qty 1

## 2017-04-12 MED ORDER — SODIUM CHLORIDE 0.9 % IV SOLN
510.0000 mg | Freq: Once | INTRAVENOUS | Status: AC
  Administered 2017-04-12: 510 mg via INTRAVENOUS
  Filled 2017-04-12: qty 17

## 2017-04-12 MED ORDER — PANTOPRAZOLE SODIUM 40 MG IV SOLR
40.0000 mg | Freq: Two times a day (BID) | INTRAVENOUS | Status: DC
  Administered 2017-04-12 – 2017-04-13 (×3): 40 mg via INTRAVENOUS
  Filled 2017-04-12 (×3): qty 40

## 2017-04-12 MED ORDER — SODIUM CHLORIDE 0.9 % IV SOLN
Freq: Once | INTRAVENOUS | Status: AC
  Administered 2017-04-12: 09:00:00 via INTRAVENOUS

## 2017-04-12 MED ORDER — ACETAMINOPHEN 325 MG PO TABS
650.0000 mg | ORAL_TABLET | Freq: Once | ORAL | Status: AC
  Administered 2017-04-12: 650 mg via ORAL
  Filled 2017-04-12: qty 2

## 2017-04-12 NOTE — Progress Notes (Addendum)
[facility] [place] NAME: Hailey Farrell    MR#:  [id number]  DATE OF BIRTH:  12-12-1950  SUBJECTIVE:  CHIEF COMPLAINT:   Chief Complaint  Patient presents with  . Near Syncope  . Shortness of Breath  . Hypotension   No melena or bloody stool since admission. REVIEW OF SYSTEMS:  Review of Systems  Constitutional: Negative for chills, fever and malaise/fatigue.  HENT: Negative for sore throat.   Eyes: Negative for blurred vision and double vision.  Respiratory: Negative for cough, hemoptysis, shortness of breath, wheezing and stridor.   Cardiovascular: Negative for chest pain, palpitations, orthopnea and leg swelling.  Gastrointestinal: Negative for abdominal pain, blood in stool, diarrhea, melena, nausea and vomiting.  Genitourinary: Negative for dysuria, flank pain and hematuria.  Musculoskeletal: Negative for back pain and joint pain.  Skin: Negative for rash.  Neurological: Negative for dizziness, sensory change, focal weakness, seizures, loss of consciousness, weakness and headaches.  Endo/Heme/Allergies: Negative for polydipsia.  Psychiatric/Behavioral: Negative for depression. The patient is not nervous/anxious.     DRUG ALLERGIES:   Allergies  Allergen Reactions  . Latex Rash   VITALS:  Blood pressure 132/77, pulse 90, temperature 98.3 F (36.8 C), temperature source Oral, resp. rate 20, height 5\' 5"  (1.651 m), weight 186 lb 14.4 oz (84.8 kg), SpO2 99 %. PHYSICAL EXAMINATION:  Physical Exam  Constitutional: She is oriented to person, place, and time and well-developed, well-nourished, and in no distress.  HENT:  Head: Normocephalic.  Mouth/Throat: Oropharynx is clear and moist.  Eyes: Conjunctivae and EOM are normal. Pupils are equal, round, and reactive to light. No scleral icterus.  Neck: Normal range of motion. Neck supple. No JVD present. No tracheal deviation present.  Cardiovascular: Normal rate, regular rhythm  and normal heart sounds. Exam reveals no gallop.  No murmur heard. Pulmonary/Chest: Effort normal and breath sounds normal. No respiratory distress. She has no wheezes. She has no rales.  Abdominal: Soft. Bowel sounds are normal. She exhibits no distension. There is no tenderness. There is no rebound.  Musculoskeletal: Normal range of motion. She exhibits no edema or tenderness.  Neurological: She is alert and oriented to person, place, and time. No cranial nerve deficit.  Skin: No rash noted. No erythema.  Psychiatric: Affect normal.   LABORATORY PANEL:  Female CBC Recent Labs  Lab 04/12/17 0644 04/12/17 1313  WBC 5.5  --   HGB 7.1* 8.0*  HCT 21.8*  --   PLT 389  --    ------------------------------------------------------------------------------------------------------------------ Chemistries  Recent Labs  Lab 04/12/17 0453  NA 141  K 3.6  CL 112*  CO2 24  GLUCOSE 103*  BUN 16  CREATININE 0.93  CALCIUM 8.4*   RADIOLOGY:  No results found. ASSESSMENT AND PLAN:   66 year old female with history of hypertension who presents with shortness of breath and found to have anemia.  1. Acute blood loss anemia, likely from GI blood loss: Transfused 1 unit PRBCs but hemoglobin is still low side at 7.1 this am, given another unit of PRBC transfusion today. Hb up to 8.0 this pm. Iron deficiency anemia per anemia panel Per GI physician, EGD in a.m. Continue PPI iv bid. IV iron due to iron deficiency.  2. HTN: blood pressure medications were hold due to low set of blood pressure. Resumed since blood pressure is normal.  3. COPD without signs of exacerbation DUONEBS PRN  4. Hyperlipidemia: Continue simvastatin  All the records are reviewed and  case discussed with Care Management/Social Worker. Management plans discussed with the patient, family and they are in agreement.  CODE STATUS: Full Code  TOTAL TIME TAKING CARE OF THIS PATIENT: 37 minutes.   More than 50% of the  time was spent in counseling/coordination of care: YES  POSSIBLE D/C IN 2 DAYS, DEPENDING ON CLINICAL CONDITION.   Demetrios Loll M.D on 04/12/2017 at 3:56 PM  Between 7am to 6pm - Pager - [phone]  After 6pm go to [email or web address] - [facility]

## 2017-04-12 NOTE — Consult Note (Signed)
Vonda Antigua, MD [address] [place], [place], [place], [address] [address], [place], [place], [place], [address] Phone: [phone]  Fax: [phone]  Consultation  Referring Provider:     No ref. provider found Primary Care Physician:  Letta Median, MD Primary Gastroenterologist:  Virgel Manifold, MD        Reason for Consultation:     Anemia  Date of Admission:  04/11/2017 Date of Consultation:  04/12/2017         HPI:   Hailey Farrell is a 66 y.o. female who presents with 1-2 weeks of weakness at home and took her blood pressure yesterday at home and was found to be hypotensive.  She came to the emergency room and was found to be anemic with hemoglobin of around 6.  She is chronically on NSAIDs, meloxicam at home that she takes daily.  She denied any abdominal pain or emesis.  Reports her last 2 bowel movements have been dark brown but denies any melena or hematochezia.  Denies any changes in appetite.  Denies any previous history of GI bleed.  I reviewed her outside records in care everywhere and her last known hemoglobin was from July 2018 and was 8.3 on December 07, 2016.  On July 2 it was 10.1 and on June 22 it was 12.9.  Patient had a laminectomy at an outside facility on July 2 and the acute drop at that time might of been related to the surgery.  She reports a history of a colonoscopy a few years ago but has never had an EGD.  I am unable to locate this colonoscopy report on her epic, care everywhere or probation.  However there is a pathology report from February 2015 that shows a tubular adenoma polyp from a procedure that was performed at [facility] by Dr. Rayann Heman.   Since admission patient has received 1 unit of packed red blood cells so far and is currently receiving her second unit at this time.  She has only received 1 dose of Protonix yesterday and her dose this morning has not been given yet.  Hemoglobin at presentation was 6.9 and only improved to 7.1 after 1  unit.  No signs of active bleeding at this time  Past Medical History:  Diagnosis Date  . Breast cancer ([place])    Right  . COPD (chronic obstructive pulmonary disease) ([place])   . Elevated lipids   . Hypertension   . Lower extremity edema   . Migraines   . Migraines   . Restless leg syndrome   . Rotator cuff injury     Past Surgical History:  Procedure Laterality Date  . ABDOMINAL HYSTERECTOMY    . BREAST BIOPSY Right   . JOINT REPLACEMENT     Lt knee  . MEDIAL PARTIAL KNEE REPLACEMENT    . PARTIAL HYSTERECTOMY      Prior to Admission medications   Medication Sig Start Date End Date Taking? Authorizing Provider  budesonide-formoterol (SYMBICORT) 160-4.5 MCG/ACT inhaler Inhale 2 puffs daily into the lungs.    Yes [provider]  furosemide (LASIX) 20 MG tablet Take 20 mg daily by mouth.    Yes [provider]  lisinopril (PRINIVIL,ZESTRIL) 20 MG tablet Take 20 mg by mouth daily.   Yes [provider]  meloxicam (MOBIC) 15 MG tablet Take 15 mg by mouth daily.   Yes [provider]  metoprolol succinate (TOPROL-XL) 50 MG 24 hr tablet Take 50 mg by  mouth daily. Take with or immediately following a meal.   Yes [provider]  potassium chloride (MICRO-K) 10 MEQ CR capsule Take 10 mEq by mouth daily.   Yes [provider]  rOPINIRole (REQUIP) 0.5 MG tablet Take 0.5 mg by mouth at bedtime.   Yes [provider]  simvastatin (ZOCOR) 20 MG tablet Take 20 mg by mouth daily at 6 PM.   Yes [provider]  SUMAtriptan (IMITREX) 6 MG/0.5ML SOLN injection Inject 6 mg every 2 (two) hours as needed into the skin for migraine or headache. May repeat in 2 hours if headache persists or recurs.   Yes [provider]  tiotropium (SPIRIVA) 18 MCG inhalation capsule Place 18 mcg into inhaler and inhale daily.   Yes [provider]  aspirin-acetaminophen-caffeine (EXCEDRIN MIGRAINE) [phone] MG tablet Take 2  tablets by mouth every 8 (eight) hours as needed for headache.    [provider]  diphenhydrAMINE (BENADRYL) 25 mg capsule Take 2 capsules (50 mg total) by mouth every 6 (six) hours as needed. Patient not taking: Reported on 03/01/2016 03/17/15   Carrie Mew, MD  HYDROcodone-acetaminophen (NORCO/VICODIN) 5-325 MG tablet Take 1-2 tablets by mouth every 4 (four) hours as needed for moderate pain. Patient not taking: Reported on 04/11/2017 03/02/16   Thornton Park, MD  ondansetron (ZOFRAN) 4 MG tablet Take 1 tablet (4 mg total) by mouth every 8 (eight) hours as needed for nausea or vomiting. Patient not taking: Reported on 04/11/2017 03/01/16   Thornton Park, MD  oxyCODONE (OXY IR/ROXICODONE) 5 MG immediate release tablet Take 1 tablet (5 mg total) by mouth every 4 (four) hours as needed for severe pain. 03/01/16   Thornton Park, MD  SUMAtriptan Succinate (SUMAVEL DOSEPRO) 6 MG/0.5ML SOTJ Inject 1 mL into the skin daily as needed.    [provider]    History reviewed. No pertinent family history.   Social History   Tobacco Use  . Smoking status: Current Some Day Smoker    Packs/day: 0.25    Years: 50.00    Pack years: 12.50    Types: Cigarettes  . Smokeless tobacco: Never Used  Substance Use Topics  . Alcohol use: No  . Drug use: No    Allergies as of 04/11/2017 - Review Complete 04/11/2017  Allergen Reaction Noted  . Latex Rash 02/03/2015    Review of Systems:    All systems reviewed and negative except where noted in HPI.   Physical Exam:  Vital signs in last 24 hours: Temp:  [98.2 F (36.8 C)-98.9 F (37.2 C)] 98.3 F (36.8 C) (11/08 0955) Pulse Rate:  [96-114] 101 (11/08 0955) Resp:  [16-43] 20 (11/08 0955) BP: (101-137)/(51-88) 137/77 (11/08 0955) SpO2:  [93 %-100 %] 100 % (11/08 0955) Weight:  [83.5 kg (184 lb)-84.8 kg (186 lb 14.4 oz)] 84.8 kg (186 lb 14.4 oz) (11/07 2214)   General:   Pleasant, cooperative in NAD Head:  Normocephalic  and atraumatic. Eyes:   No icterus.   Conjunctiva pink. PERRLA. Ears:  Normal auditory acuity. Neck:  Supple; no masses or thyroidomegaly Lungs: Respirations even and unlabored. Lungs clear to auscultation bilaterally.   No wheezes, crackles, or rhonchi.  Heart:  Regular rate and rhythm;  Without murmur, clicks, rubs or gallops Abdomen:  Soft, nondistended, nontender. Normal bowel sounds. No appreciable masses or hepatomegaly.  No rebound or guarding.  Neurologic:  Alert and oriented x3;  grossly normal neurologically. Skin:  Intact without significant lesions or rashes. Cervical  Nodes:  No significant cervical adenopathy. Psych:  Alert and cooperative. Normal affect.  LAB RESULTS: Recent Labs    04/11/17 1407 04/12/17 0644  WBC 6.4 5.5  HGB 6.9* 7.1*  HCT 22.0* 21.8*  PLT 457* 389   BMET Recent Labs    04/11/17 1407 04/12/17 0453  NA 135 141  K 3.9 3.6  CL 102 112*  CO2 26 24  GLUCOSE 101* 103*  BUN 23* 16  CREATININE 1.05* 0.93  CALCIUM 9.1 8.4*   LFT No results for input(s): PROT, ALBUMIN, AST, ALT, ALKPHOS, BILITOT, BILIDIR, IBILI in the last 72 hours. PT/INR No results for input(s): LABPROT, INR in the last 72 hours.  STUDIES: No results found.  Previous documentation and records in care everywhere lab tests and care of were reviewed as stated in HPI.   Impression / Plan:   Hailey Farrell is a 66 y.o. y/o female with 1-2-week history of weakness at home and found to be hypotensive blood pressure check at home with chronic NSAID use and acute presentation  Possibly upper GI bleed due to NSAID use at home leading to peptic ulcer disease Patient will need to be resuscitated further packed red blood cells and Protonix IV before endoscopic intervention I have requested nurse to please give her her Protonix morning dose now and continue her 2nd packed red blood cell transfusion Avoid NSAIDs Continue serial CBCs and transfuse as needed The timing of EGD will need  to be determined based on clinical status The patient has active signs of bleeding please page on-call GI for evaluation of emergent EGD Otherwise the plan would be to allow for appropriate resuscitation before scheduling EGD inpatient We will plan for EGD today or tomorrow  Timing of her repeat colonoscopy will need to be determined by primary care provider after review of her previous colonoscopy records and recommendations at the time of the colonoscopy.  PCP to please follow this up on discharge.  We will continue to follow   Thank you for involving me in the care of this patient.      LOS: 1 day   Virgel Manifold, MD  04/12/2017, 10:56 AM

## 2017-04-12 NOTE — Progress Notes (Signed)
Initial Nutrition Assessment  DOCUMENTATION CODES:   Obesity unspecified  INTERVENTION:   Recommend check B-12 levels as pt at high risk for deficiency   Recommend IV iron supplementation   Boost Breeze po TID, each supplement provides 250 kcal and 9 grams of protein  RD will switch pt to strawberry Ensure when diet advanced  RD will add daily MVI when diet advanced   NUTRITION DIAGNOSIS:   Inadequate oral intake related to acute illness as evidenced by patient/family report, other (pt on clear liquid diet ).  GOAL:   Patient will meet greater than or equal to 90% of their needs  MONITOR:   PO intake, Supplement acceptance, Weight trends, I & O's  REASON FOR ASSESSMENT:   Malnutrition Screening Tool    ASSESSMENT:   66 year old female with history of hypertension who presents with shortness of breath and found to have anemia.   Met with pt in room today. Pt reports poor appetite and oral for two weeks pta. Pt reports that she just doesn't have an appetite and feels too weak to eat. Pt s/p prbc transfusion; pt noted to have low iron this admit; recommend IV iron supplementation. Pt is also at high risk for deficiency r/t long term NSAID use and PPIs; recommend check B-12 levels. Pt currently eating 100% of her clear liquid diet. RD will add Boost Breeze to help pt meet estimated protein needs. Pt likes strawberry Ensure and would like to have this when her diet is advanced. Pt reports that she is weight stable. Pt to possibly have EGD today.      Medications reviewed and include: protonix  Labs reviewed: Cl 112(H), Ca 8.4(L) Iron 13(L)- 11/7 Hgb 7.1(L), Hct 21.8(L)  Nutrition-Focused physical exam completed. Findings are no fat depletion, no muscle depletion, and mild edema BLE.   Diet Order:  Diet clear liquid Room service appropriate? Yes; Fluid consistency: Thin  EDUCATION NEEDS:   Education needs have been addressed  Skin: Reviewed RN Assessment  Last BM:   PTA  Height:   Ht Readings from Last 1 Encounters:  04/11/17 '5\' 5"'$  (1.651 m)    Weight:   Wt Readings from Last 1 Encounters:  04/11/17 186 lb 14.4 oz (84.8 kg)    Ideal Body Weight:  56.8 kg  BMI:  Body mass index is 31.1 kg/m.  Estimated Nutritional Needs:   Kcal:  1600-1900kcal/day   Protein:  85-93g/day   Fluid:  >1.6L/day   Koleen Distance MS, RD, LDN Pager #[phone] After Hours Pager: [phone]

## 2017-04-13 ENCOUNTER — Inpatient Hospital Stay: Payer: Medicare HMO | Admitting: Anesthesiology

## 2017-04-13 ENCOUNTER — Encounter: Payer: Self-pay | Admitting: *Deleted

## 2017-04-13 ENCOUNTER — Encounter
Admission: EM | Disposition: A | Discharge status: Home / Self Care | Payer: Self-pay | Source: Home / Self Care | Attending: Internal Medicine

## 2017-04-13 DIAGNOSIS — D62 Acute posthemorrhagic anemia: Secondary | ICD-10-CM

## 2017-04-13 DIAGNOSIS — K269 Duodenal ulcer, unspecified as acute or chronic, without hemorrhage or perforation: Secondary | ICD-10-CM

## 2017-04-13 DIAGNOSIS — K253 Acute gastric ulcer without hemorrhage or perforation: Secondary | ICD-10-CM

## 2017-04-13 HISTORY — PX: ESOPHAGOGASTRODUODENOSCOPY: SHX5428

## 2017-04-13 LAB — TYPE AND SCREEN
ABO/RH(D): O POS
Antibody Screen: NEGATIVE
UNIT DIVISION: 0
UNIT DIVISION: 0

## 2017-04-13 LAB — BPAM RBC
BLOOD PRODUCT EXPIRATION DATE: 201812052359
BLOOD PRODUCT EXPIRATION DATE: 201812052359
ISSUE DATE / TIME: 201811072350
ISSUE DATE / TIME: 201811080917
UNIT TYPE AND RH: 5100
Unit Type and Rh: 5100

## 2017-04-13 LAB — HEMOGLOBIN: HEMOGLOBIN: 8 g/dL — AB (ref 12.0–16.0)

## 2017-04-13 LAB — VITAMIN B12: Vitamin B-12: 444 pg/mL (ref 180–914)

## 2017-04-13 SURGERY — EGD (ESOPHAGOGASTRODUODENOSCOPY)
Anesthesia: General | Laterality: Left

## 2017-04-13 MED ORDER — KETOROLAC TROMETHAMINE 15 MG/ML IJ SOLN
15.0000 mg | Freq: Once | INTRAMUSCULAR | Status: AC
  Administered 2017-04-13: 15 mg via INTRAVENOUS
  Filled 2017-04-13: qty 1

## 2017-04-13 MED ORDER — PANTOPRAZOLE SODIUM 40 MG PO TBEC
40.0000 mg | DELAYED_RELEASE_TABLET | Freq: Two times a day (BID) | ORAL | Status: DC
  Administered 2017-04-13: 40 mg via ORAL
  Filled 2017-04-13: qty 1

## 2017-04-13 MED ORDER — SODIUM CHLORIDE 0.9 % IV SOLN
INTRAVENOUS | Status: DC | PRN
  Administered 2017-04-13: 14:00:00 via INTRAVENOUS

## 2017-04-13 MED ORDER — FENTANYL CITRATE (PF) 100 MCG/2ML IJ SOLN
INTRAMUSCULAR | Status: AC
  Filled 2017-04-13: qty 2

## 2017-04-13 MED ORDER — LIDOCAINE HCL (CARDIAC) 20 MG/ML IV SOLN
INTRAVENOUS | Status: DC | PRN
  Administered 2017-04-13 (×2): 30 mg via INTRAVENOUS

## 2017-04-13 MED ORDER — FENTANYL CITRATE (PF) 100 MCG/2ML IJ SOLN
INTRAMUSCULAR | Status: DC | PRN
  Administered 2017-04-13 (×2): 25 ug via INTRAVENOUS
  Administered 2017-04-13: 50 ug via INTRAVENOUS

## 2017-04-13 MED ORDER — MIDAZOLAM HCL 2 MG/2ML IJ SOLN
INTRAMUSCULAR | Status: DC | PRN
  Administered 2017-04-13: 2 mg via INTRAVENOUS

## 2017-04-13 MED ORDER — PROPOFOL 500 MG/50ML IV EMUL
INTRAVENOUS | Status: AC
  Filled 2017-04-13: qty 50

## 2017-04-13 MED ORDER — PROPOFOL 500 MG/50ML IV EMUL
INTRAVENOUS | Status: DC | PRN
  Administered 2017-04-13: 120 ug/kg/min via INTRAVENOUS

## 2017-04-13 MED ORDER — LIDOCAINE HCL (PF) 2 % IJ SOLN
INTRAMUSCULAR | Status: AC
  Filled 2017-04-13: qty 10

## 2017-04-13 MED ORDER — BUTAMBEN-TETRACAINE-BENZOCAINE 2-2-14 % EX AERO
INHALATION_SPRAY | CUTANEOUS | Status: DC | PRN
  Administered 2017-04-13: 2 via TOPICAL

## 2017-04-13 MED ORDER — PANTOPRAZOLE SODIUM 40 MG PO TBEC: 40.0000 mg | DELAYED_RELEASE_TABLET | Freq: Two times a day (BID) | ORAL | 1 refills | Status: DC

## 2017-04-13 MED ORDER — MIDAZOLAM HCL 2 MG/2ML IJ SOLN
INTRAMUSCULAR | Status: AC
  Filled 2017-04-13: qty 2

## 2017-04-13 NOTE — OR Nursing (Signed)
Report to [facility]. Pt s/p egd. Npo until 1510. May be d/c home today. To be transported back with orderly to room

## 2017-04-13 NOTE — Anesthesia Procedure Notes (Signed)
Performed by: Cook-Martin, Harika Laidlaw Pre-anesthesia Checklist: Patient identified, Emergency Drugs available, Suction available, Patient being monitored and Timeout performed Patient Re-evaluated:Patient Re-evaluated prior to induction Oxygen Delivery Method: Nasal cannula Preoxygenation: Pre-oxygenation with 100% oxygen Induction Type: IV induction Airway Equipment and Method: Bite block Placement Confirmation: positive ETCO2 and CO2 detector       

## 2017-04-13 NOTE — Anesthesia Post-op Follow-up Note (Signed)
Anesthesia QCDR form completed.        

## 2017-04-13 NOTE — Discharge Instructions (Signed)
Anemia, Nonspecific Anemia is a condition in which the concentration of red blood cells or hemoglobin in the blood is below normal. Hemoglobin is a substance in red blood cells that carries oxygen to the tissues of the body. Anemia results in not enough oxygen reaching these tissues. What are the causes? Common causes of anemia include:  Excessive bleeding. Bleeding may be internal or external. This includes excessive bleeding from periods (in women) or from the intestine.  Poor nutrition.  Chronic kidney, thyroid, and liver disease.  Bone marrow disorders that decrease red blood cell production.  Cancer and treatments for cancer.  HIV, AIDS, and their treatments.  Spleen problems that increase red blood cell destruction.  Blood disorders.  Excess destruction of red blood cells due to infection, medicines, and autoimmune disorders.  What are the signs or symptoms?  Minor weakness.  Dizziness.  Headache.  Palpitations.  Shortness of breath, especially with exercise.  Paleness.  Cold sensitivity.  Indigestion.  Nausea.  Difficulty sleeping.  Difficulty concentrating. Symptoms may occur suddenly or they may develop slowly. How is this diagnosed? Additional blood tests are often needed. These help your health care provider determine the best treatment. Your health care provider will check your stool for blood and look for other causes of blood loss. How is this treated? Treatment varies depending on the cause of the anemia. Treatment can include:  Supplements of iron, vitamin A83, or folic acid.  Hormone medicines.  A blood transfusion. This may be needed if blood loss is severe.  Hospitalization. This may be needed if there is significant continual blood loss.  Dietary changes.  Spleen removal.  Follow these instructions at home: Keep all follow-up appointments. It often takes many weeks to correct anemia, and having your health care provider check on  your condition and your response to treatment is very important. Get help right away if:  You develop extreme weakness, shortness of breath, or chest pain.  You become dizzy or have trouble concentrating.  You develop heavy vaginal bleeding.  You develop a rash.  You have bloody or black, tarry stools.  You faint.  You vomit up blood.  You vomit repeatedly.  You have abdominal pain.  You have a fever or persistent symptoms for more than 2-3 days.  You have a fever and your symptoms suddenly get worse.  You are dehydrated. This information is not intended to replace advice given to you by your health care provider. Make sure you discuss any questions you have with your health care provider. Document Released: 06/29/2004 Document Revised: 11/03/2015 Document Reviewed: 11/15/2012 Elsevier Interactive Patient Education  2017 [employer].  Avoid NSAIDS. Outpatient PT

## 2017-04-13 NOTE — Anesthesia Preprocedure Evaluation (Signed)
Anesthesia Evaluation  Patient identified by MRN, date of birth, ID band Patient awake    Reviewed: Allergy & Precautions, NPO status , Patient's Chart, lab work & pertinent test results  Airway Mallampati: III       Dental  (+) Partial Upper, Partial Lower   Pulmonary neg sleep apnea, COPD,  COPD inhaler, former smoker,           Cardiovascular hypertension, Pt. on medications (-) Past MI and (-) CHF (-) dysrhythmias (-) Valvular Problems/Murmurs     Neuro/Psych neg Seizures    GI/Hepatic Neg liver ROS, neg GERD  ,  Endo/Other  neg diabetes  Renal/GU negative Renal ROS     Musculoskeletal   Abdominal   Peds  Hematology  (+) anemia ,   Anesthesia Other Findings   Reproductive/Obstetrics                            Anesthesia Physical Anesthesia Plan  ASA: III  Anesthesia Plan: General   Post-op Pain Management:    Induction: Intravenous  PONV Risk Score and Plan:   Airway Management Planned:   Additional Equipment:   Intra-op Plan:   Post-operative Plan:   Informed Consent: I have reviewed the patients History and Physical, chart, labs and discussed the procedure including the risks, benefits and alternatives for the proposed anesthesia with the patient or authorized representative who has indicated his/her understanding and acceptance.     Plan Discussed with:   Anesthesia Plan Comments:         Anesthesia Quick Evaluation

## 2017-04-13 NOTE — Discharge Summary (Signed)
[facility] NAME: Hailey Farrell    MR#:  [id number]  DATE OF BIRTH:  1951-01-15  DATE OF ADMISSION:  04/11/2017   ADMITTING PHYSICIAN: Bettey Costa, MD  DATE OF DISCHARGE: No discharge date for patient encounter.  PRIMARY CARE PHYSICIAN: Letta Median, MD   ADMISSION DIAGNOSIS:  Near syncope [R55] Symptomatic anemia [D64.9] GI bleed [K92.2] DISCHARGE DIAGNOSIS:  Active Problems:   GIB (gastrointestinal bleeding)  SECONDARY DIAGNOSIS:   Past Medical History:  Diagnosis Date  . Breast cancer ([place])    Right  . COPD (chronic obstructive pulmonary disease) ([employer])   . Elevated lipids   . Hypertension   . Lower extremity edema   . Migraines   . Migraines   . Restless leg syndrome   . Rotator cuff injury    HOSPITAL COURSE:  66 year old female with history of hypertension who presents with shortness of breath and found to have anemia.  1. Acute blood loss anemia, from GI blood loss due to duodenal ulcer: Transfused 2 unit PRBCs, Hb stable at 8.0. Iron deficiency anemia per anemia panel, given IV iron. EGD today:  One non-bleeding superficial duodenal ulcer with no stigmata of bleeding was found in the duodenal bulb. Protonix p.o. twice daily for 8 weeks per GI physician.  2. HTN: blood pressure medications were hold due to low set of blood pressure. Resumed since blood pressure is elevated.  3. COPD without signs of exacerbation DUONEBS PRN  4. Hyperlipidemia: Continue simvastatin  DISCHARGE CONDITIONS:  Stable, discharge to home today. CONSULTS OBTAINED:  Treatment Team:  Virgel Manifold, MD DRUG ALLERGIES:   Allergies  Allergen Reactions  . Latex Rash   DISCHARGE MEDICATIONS:   Allergies as of 04/13/2017      Reactions   Latex Rash      Medication List    STOP taking these medications   aspirin-acetaminophen-caffeine 250-250-65 MG tablet Commonly known as:  EXCEDRIN MIGRAINE     HYDROcodone-acetaminophen 5-325 MG tablet Commonly known as:  NORCO/VICODIN   meloxicam 15 MG tablet Commonly known as:  MOBIC     TAKE these medications   budesonide-formoterol 160-4.5 MCG/ACT inhaler Commonly known as:  SYMBICORT Inhale 2 puffs daily into the lungs.   diphenhydrAMINE 25 mg capsule Commonly known as:  BENADRYL Take 2 capsules (50 mg total) by mouth every 6 (six) hours as needed.   furosemide 20 MG tablet Commonly known as:  LASIX Take 20 mg daily by mouth.   lisinopril 20 MG tablet Commonly known as:  PRINIVIL,ZESTRIL Take 20 mg by mouth daily.   metoprolol succinate 50 MG 24 hr tablet Commonly known as:  TOPROL-XL Take 50 mg by mouth daily. Take with or immediately following a meal.   ondansetron 4 MG tablet Commonly known as:  ZOFRAN Take 1 tablet (4 mg total) by mouth every 8 (eight) hours as needed for nausea or vomiting.   oxyCODONE 5 MG immediate release tablet Commonly known as:  Oxy IR/ROXICODONE Take 1 tablet (5 mg total) by mouth every 4 (four) hours as needed for severe pain.   pantoprazole 40 MG tablet Commonly known as:  PROTONIX Take 1 tablet (40 mg total) 2 (two) times daily before a meal by mouth.   potassium chloride 10 MEQ CR capsule Commonly known as:  MICRO-K Take 10 mEq by mouth daily.   rOPINIRole 0.5 MG tablet Commonly known as:  REQUIP Take 0.5 mg by mouth at bedtime.  simvastatin 20 MG tablet Commonly known as:  ZOCOR Take 20 mg by mouth daily at 6 PM.   SUMAVEL DOSEPRO 6 MG/0.5ML Sotj Generic drug:  SUMAtriptan Succinate Inject 1 mL into the skin daily as needed.   SUMAtriptan 6 MG/0.5ML Soln injection Commonly known as:  IMITREX Inject 6 mg every 2 (two) hours as needed into the skin for migraine or headache. May repeat in 2 hours if headache persists or recurs.   tiotropium 18 MCG inhalation capsule Commonly known as:  SPIRIVA Place 18 mcg into inhaler and inhale daily.        DISCHARGE INSTRUCTIONS:   See AVS.  If you experience worsening of your admission symptoms, develop shortness of breath, life threatening emergency, suicidal or homicidal thoughts you must seek medical attention immediately by calling 911 or calling your MD immediately  if symptoms less severe.  You Must read complete instructions/literature along with all the possible adverse reactions/side effects for all the Medicines you take and that have been prescribed to you. Take any new Medicines after you have completely understood and accpet all the possible adverse reactions/side effects.   Please note  You were cared for by a hospitalist during your hospital stay. If you have any questions about your discharge medications or the care you received while you were in the hospital after you are discharged, you can call the unit and asked to speak with the hospitalist on call if the hospitalist that took care of you is not available. Once you are discharged, your primary care physician will handle any further medical issues. Please note that NO REFILLS for any discharge medications will be authorized once you are discharged, as it is imperative that you return to your primary care physician (or establish a relationship with a primary care physician if you do not have one) for your aftercare needs so that they can reassess your need for medications and monitor your lab values.    On the day of Discharge:  VITAL SIGNS:  Blood pressure (!) 167/85, pulse 76, temperature 98.2 F (36.8 C), temperature source Oral, resp. rate 18, height 5\' 5"  (1.651 m), weight 186 lb 14.4 oz (84.8 kg), SpO2 97 %. PHYSICAL EXAMINATION:  GENERAL:  66 y.o.-year-old patient lying lying in the bed with no acute distress.  EYES: Pupils equal, round, reactive to light and accommodation. No scleral icterus. Extraocular muscles intact.  HEENT: Head atraumatic, normocephalic. Oropharynx and nasopharynx clear.  NECK:  Supple, no jugular venous distention. No thyroid  enlargement, no tenderness.  LUNGS: Normal breath sounds bilaterally, no wheezing, rales,rhonchi or crepitation. No use of accessory muscles of respiration.  CARDIOVASCULAR: S1, S2 normal. No murmurs, rubs, or gallops.  ABDOMEN: Soft, non-tender, non-distended. Bowel sounds present. No organomegaly or mass.  EXTREMITIES: No pedal edema, cyanosis, or clubbing.  NEUROLOGIC: Cranial nerves II through XII are intact. Muscle strength 5/5 in all extremities. Sensation intact. Gait not checked.  PSYCHIATRIC: The patient is alert and oriented x 3.  SKIN: No obvious rash, lesion, or ulcer.  DATA REVIEW:   CBC Recent Labs  Lab 04/12/17 0644  04/13/17 0405  WBC 5.5  --   --   HGB 7.1*   < > 8.0*  HCT 21.8*  --   --   PLT 389  --   --    < > = values in this interval not displayed.    Chemistries  Recent Labs  Lab 04/12/17 0453  NA 141  K 3.6  CL 112*  CO2 24  GLUCOSE 103*  BUN 16  CREATININE 0.93  CALCIUM 8.4*     Microbiology Results  No results found for this or any previous visit.  RADIOLOGY:  No results found.   Management plans discussed with the patient, her husband and they are in agreement.  CODE STATUS: Full Code   TOTAL TIME TAKING CARE OF THIS PATIENT: 33 minutes.    Demetrios Loll M.D on 04/13/2017 at 3:28 PM  Between 7am to 6pm - Pager - [phone]  After 6pm go to [email or web address] - Proofreader  [facility]  Office  [phone]  CC: Primary care physician; Letta Median, MD   Note: This dictation was prepared with Dragon dictation along with smaller phrase technology. Any transcriptional errors that result from this process are unintentional.

## 2017-04-13 NOTE — Transfer of Care (Signed)
Immediate Anesthesia Transfer of Care Note  Patient: Hailey Farrell  Procedure(s) Performed: ESOPHAGOGASTRODUODENOSCOPY (EGD) (Left )  Patient Location: PACU  Anesthesia Type:General  Level of Consciousness: awake and sedated  Airway & Oxygen Therapy: Patient Spontanous Breathing and Patient connected to nasal cannula oxygen  Post-op Assessment: Report given to RN and Post -op Vital signs reviewed and stable  Post vital signs: Reviewed and stable  Last Vitals:  Vitals:   04/13/17 0426 04/13/17 1245  BP: (!) 141/77 (!) 149/88  Pulse: 87 76  Resp: 16 16  Temp: 36.7 C (!) 35.9 C  SpO2: 98% 100%    Last Pain:  Vitals:   04/13/17 1245  TempSrc: Tympanic  PainSc:          Complications: No apparent anesthesia complications

## 2017-04-13 NOTE — Anesthesia Postprocedure Evaluation (Signed)
Anesthesia Post Note  Patient: Hailey Farrell  Procedure(s) Performed: ESOPHAGOGASTRODUODENOSCOPY (EGD) (Left )  Patient location during evaluation: Endoscopy Anesthesia Type: General Level of consciousness: awake and alert Pain management: pain level controlled Vital Signs Assessment: post-procedure vital signs reviewed and stable Respiratory status: spontaneous breathing and respiratory function stable Cardiovascular status: stable Anesthetic complications: no     Last Vitals:  Vitals:   04/13/17 1245 04/13/17 1437  BP: (!) 149/88   Pulse: 76   Resp: 16   Temp: (!) 35.9 C (!) 36.2 C  SpO2: 100%     Last Pain:  Vitals:   04/13/17 1437  TempSrc: Tympanic  PainSc:                  KEPHART,WILLIAM K

## 2017-04-13 NOTE — Op Note (Addendum)
[facility] Patient Name: Hailey Farrell Procedure Date: 04/13/2017 2:10 PM MRN: [id number] Account #: [id number] Date of Birth: 1950/12/26 Admit Type: Outpatient Age: 66 Room: [facility] ENDO ROOM 4 Gender: Female Note Status: Finalized Procedure:            Upper GI endoscopy Indications:          Acute post hemorrhagic anemia Providers:            Elizer Bostic B. Bonna Gains MD, MD Referring MD:         Baxter Kail. Rebeca Alert MD, MD (Referring MD) Medicines:            Monitored Anesthesia Care Complications:        No immediate complications. Procedure:            Pre-Anesthesia Assessment:                       - Prior to the procedure, a History and Physical was                        performed, and patient medications, allergies and                        sensitivities were reviewed. The patient's tolerance of                        previous anesthesia was reviewed.                       - The risks and benefits of the procedure and the                        sedation options and risks were discussed with the                        patient. All questions were answered and informed                        consent was obtained.                       - Patient identification and proposed procedure were                        verified prior to the procedure by the physician, the                        nurse, the anesthesiologist, the anesthetist and the                        technician. The procedure was verified in the procedure                        room.                       - ASA Grade Assessment: III - A patient with severe                        systemic disease.  After obtaining informed consent, the endoscope was                        passed under direct vision. Throughout the procedure,                        the patient's blood pressure, pulse, and oxygen                        saturations were monitored continuously. The Endoscope                         was introduced through the mouth, and advanced to the                        second part of duodenum. The upper GI endoscopy was                        accomplished with ease. The patient tolerated the                        procedure well. Findings:      The examined esophagus was normal.      2-3 non-bleeding superficial gastric ulcers with no stigmata of bleeding       were found in the gastric antrum. The largest lesion was 3 mm in largest       dimension. Biopsies were taken with a cold forceps for histology. No old       or new blood present. No active bleeding present.      One non-bleeding superficial duodenal ulcer with no stigmata of bleeding       was found in the duodenal bulb. The lesion was 2 mm in largest dimension.      Patchy mildly erythematous mucosa without active bleeding and with no       stigmata of bleeding was found in the duodenal bulb. Impression:           - Normal esophagus.                       - Non-bleeding gastric ulcers with no stigmata of                        bleeding. Biopsied.                       - One non-bleeding duodenal ulcer with no stigmata of                        bleeding.                       - Erythematous duodenopathy. Recommendation:       - Await pathology results.                       - f/u in GI clinic in 3-4 weeks to evaluate for                        improvement in anemia                       -  Advance diet as tolerated.                       - Continue present medications.                       - Patient has a contact number available for                        emergencies. The signs and symptoms of potential                        delayed complications were discussed with the patient.                        Return to normal activities tomorrow. Written discharge                        instructions were provided to the patient.                       - The findings and recommendations were discussed  with                        the patient.                       - The findings and recommendations were discussed with                        the patient's family.                       - Use Protonix (pantoprazole) 40 mg PO BID for 8 weeks.                       - Avoid NSAIDs. Patient was previously taking these at                        home and was the likely cause of her gastric ulcers.                       Monitor CBC and transfuse PRN Procedure Code(s):    --- Professional ---                       [phone], Esophagogastroduodenoscopy, flexible, transoral;                        with biopsy, single or multiple Diagnosis Code(s):    --- Professional ---                       D62, Acute posthemorrhagic anemia                       K31.89, Other diseases of stomach and duodenum                       K25.9, Gastric ulcer, unspecified as acute or chronic,                        without hemorrhage or perforation  K26.9, Duodenal ulcer, unspecified as acute or chronic,                        without hemorrhage or perforation CPT copyright 2016 [employer]. All rights reserved. The codes documented in this report are preliminary and upon coder review may  be revised to meet current compliance requirements.  Vonda Antigua, MD Margretta Sidle B. Bonna Gains MD, MD 04/13/2017 2:41:02 PM This report has been signed electronically. Number of Addenda: 0 Note Initiated On: 04/13/2017 2:10 PM      [facility]

## 2017-04-13 NOTE — Evaluation (Addendum)
Physical Therapy Evaluation Patient Details Name: Hailey Farrell MRN: [id number] DOB: 1950-08-03 Today's Date: 04/13/2017   History of Present Illness  Pt was admitted to the hospital following a GI bleed.  Medical hx includes cervical fusion.Addendum: PMH includes COPD and Htn.  Clinical Impression  Pt is a 66 year old female who lives in a single story home with her husband.  She is a limited community ambulator who uses a SPC to ambulate longer distances.  Pt recently had a Cx spinal fusion and initiated PT 1 week prior to being admitted to the hospital for a GI bleed.  Pt demonstrates decreased L shoulder flexion which she states is a result of this surgery.  Pt was able to ambulate 190 ft with RW, showing signs of fatigue and increased flexion of posture after approximately 100 ft.  Pt is required to ascend/descend 1 step with unilateral handrail to enter/exit her home.  Stair evaluation was deferred today due to pt reported fatigue and mild dizziness following ambulation.  Pt will continue to benefit from skilled PT with focus on tolerance to activity, strength, ROM, balance and gait training.    Follow Up Recommendations Outpatient PT    Equipment Recommendations       Recommendations for Other Services       Precautions / Restrictions Precautions Precautions: Fall Restrictions Weight Bearing Restrictions: No      Mobility  Bed Mobility Overal bed mobility: Modified Independent             General bed mobility comments: Pt uses bed rails to assist supine to sit and sit to supine.  Transfers Overall transfer level: Modified independent Equipment used: Rolling walker (2 wheeled)             General transfer comment: Pt reports general weakness today due to current illness.  She states that she is able to perform transfers independently when at home.  Ambulation/Gait Ambulation/Gait assistance: Modified independent (Device/Increase time) Ambulation Distance  (Feet): 190 Feet Assistive device: Rolling walker (2 wheeled)     Gait velocity interpretation: Below normal speed for age/gender General Gait Details: Pt demonstrates low foot clearance, decreased step length, flexed posture with use of RW and overall fatigue following approximately 100 ft of ambulation.  Stairs Stairs: (Did not perform stairs today due to pt reporting dizziness following ambulation.)          Wheelchair Mobility    Modified Rankin (Stroke Patients Only)       Balance Overall balance assessment: Modified Independent(Did not perform standardized balance assessment due to pt reported dizziness.  Pt required RW for ambulation and demonstrated a decreased gait speed, indicating fall risk with communitiy ambulation.  No recent falls reported.)                                           Pertinent Vitals/Pain Pain Assessment: No/denies pain    Home Living Family/patient expects to be discharged to:: Private residence Living Arrangements: Spouse/significant other Available Help at Discharge: Family Type of Home: House Home Access: Stairs to enter Entrance Stairs-Rails: Right Entrance Stairs-Number of Steps: 5, addendum: pt has 1 step to enter/exit home. Home Layout: One level Home Equipment: Walker - 2 wheels;Cane - single point Additional Comments: Pt states that she does not need an AD regularly but will use her SPC on occasion when walking longer distances.  Prior Function Level of Independence: Independent with assistive device(s)         Comments: SPC used for community ambulation during grocery shopping, etc.     Hand Dominance        Extremity/Trunk Assessment   Upper Extremity Assessment Upper Extremity Assessment: LUE deficits/detail LUE Deficits / Details: L Shoulder ROM: 45 degrees.  Pt states that she attended one PT treatment following Cx fusion and then was admitted to hospital before she could complete any more  treatments.    Lower Extremity Assessment Lower Extremity Assessment: Generalized weakness(R LE weakness > L LE)    Cervical / Trunk Assessment Cervical / Trunk Assessment: Kyphotic(Slightly kyphotic and flexed.  Pt is recovering from a Cx spinal fusion.)  Communication   Communication: No difficulties  Cognition Arousal/Alertness: Awake/alert Behavior During Therapy: WFL for tasks assessed/performed Overall Cognitive Status: Within Functional Limits for tasks assessed                                        General Comments      Exercises     Assessment/Plan    PT Assessment Patient needs continued PT services  PT Problem List Decreased strength;Decreased range of motion;Decreased activity tolerance;Decreased balance;Decreased mobility       PT Treatment Interventions DME instruction;Gait training;Functional mobility training;Therapeutic activities;Stair training;Therapeutic exercise;Balance training;Neuromuscular re-education;Patient/family education    PT Goals (Current goals can be found in the Care Plan section)       Frequency Min 2X/week   Barriers to discharge        Co-evaluation               AM-PAC PT "6 Clicks" Daily Activity  Outcome Measure Difficulty turning over in bed (including adjusting bedclothes, sheets and blankets)?: A Little Difficulty moving from lying on back to sitting on the side of the bed? : A Little Difficulty sitting down on and standing up from a chair with arms (e.g., wheelchair, bedside commode, etc,.)?: A Little Help needed moving to and from a bed to chair (including a wheelchair)?: A Little Help needed walking in hospital room?: A Little Help needed climbing 3-5 steps with a railing? : A Lot 6 Click Score: 17    End of Session Equipment Utilized During Treatment: Gait belt Activity Tolerance: Patient limited by fatigue(Pt limited by reported feelings for mild dizziness and fatigue following  ambulation.) Patient left: in bed;with call bell/phone within reach;with bed alarm set;with family/visitor present   PT Visit Diagnosis: Unsteadiness on feet (R26.81);Other abnormalities of gait and mobility (R26.89);Muscle weakness (generalized) (M62.81);Difficulty in walking, not elsewhere classified (R26.2);Dizziness and giddiness (R42)    Time: 1696-7893 PT Time Calculation (min) (ACUTE ONLY): 20 min   Charges:   PT Evaluation $PT Eval Low Complexity: 1 Low     PT G Codes:   PT G-Codes **NOT FOR INPATIENT CLASS** Functional Assessment Tool Used: AM-PAC 6 Clicks Basic Mobility Functional Limitation: Mobility: Walking and moving around Mobility: Walking and Moving Around Current Status (Y1017): At least 40 percent but less than 60 percent impaired, limited or restricted Mobility: Walking and Moving Around Goal Status [phone]): At least 1 percent but less than 20 percent impaired, limited or restricted    Roxanne Gates, PT, DPT  Roxanne Gates 04/13/2017, 1:38 PM

## 2017-04-13 NOTE — Care Management Important Message (Signed)
Important Message  Patient Details  Name: Hailey Farrell MRN: [id number] Date of Birth: Jul 03, 1950   Medicare Important Message Given:  N/A - LOS <3 / Initial given by admissions    Beverly Sessions, RN 04/13/2017, 11:31 AM

## 2017-04-13 NOTE — Progress Notes (Signed)
Patient tolerating diet.  Discharge instructions reviewed in detail with patient and husband, including new prescription, symptoms for which to watch, and followup appointments.  Understanding was verbalized and all questions were answered.  Patient discharged home via wheelchair in stable condition escorted by nursing staff.

## 2017-04-16 ENCOUNTER — Encounter: Payer: Self-pay | Admitting: Gastroenterology

## 2017-04-17 LAB — SURGICAL PATHOLOGY

## 2017-04-19 ENCOUNTER — Encounter: Payer: Self-pay | Admitting: Gastroenterology

## 2017-04-19 ENCOUNTER — Other Ambulatory Visit: Payer: Self-pay | Admitting: Gastroenterology

## 2017-05-22 ENCOUNTER — Encounter (INDEPENDENT_AMBULATORY_CARE_PROVIDER_SITE_OTHER): Payer: Self-pay

## 2017-05-22 ENCOUNTER — Ambulatory Visit (INDEPENDENT_AMBULATORY_CARE_PROVIDER_SITE_OTHER): Payer: Medicare HMO | Admitting: Gastroenterology

## 2017-05-22 ENCOUNTER — Other Ambulatory Visit: Payer: Self-pay

## 2017-05-22 ENCOUNTER — Encounter: Payer: Self-pay | Admitting: Gastroenterology

## 2017-05-22 VITALS — BP 117/72 | HR 111 | Wt 176.6 lb

## 2017-05-22 DIAGNOSIS — B9681 Helicobacter pylori [H. pylori] as the cause of diseases classified elsewhere: Secondary | ICD-10-CM | POA: Diagnosis not present

## 2017-05-22 DIAGNOSIS — Z86010 Personal history of colonic polyps: Secondary | ICD-10-CM

## 2017-05-22 DIAGNOSIS — K253 Acute gastric ulcer without hemorrhage or perforation: Secondary | ICD-10-CM

## 2017-05-22 MED ORDER — PANTOPRAZOLE SODIUM 40 MG PO TBEC: 40.0000 mg | DELAYED_RELEASE_TABLET | Freq: Two times a day (BID) | ORAL | 2 refills | Status: DC

## 2017-05-22 NOTE — Progress Notes (Signed)
Vonda Antigua, MD [address]  [place]  [place], [place] [address]  Main: [phone]  Fax: [phone]   Primary Care Physician: Letta Median, MD  Primary Gastroenterologist:  Dr. Vonda Antigua  Chief Complaint  Patient presents with  . Follow-up    6 week f/u anemia, EDG 04/13/2017    HPI: Hailey Farrell is a 66 y.o. female here for follow-up after recent hospital admission for anemia in the setting of NSAID use.  Patient underwent an EGD on November 9 with me which showed 2-3 clean-based gastric ulcers, 3 mm in greatest dimension.  One 2 mm duodenal bulb ulcer.  Gastric biopsies showed H. pylori and patient is status post triple therapy.  Patient was supposed to continue Protonix 40 mg twice daily for a total of 8 weeks.  She ran out of her medication last week and has not had it for 1 week at this time.  She denies any abdominal pain.  Denies any black stool or hematochezia.  Denies any emesis.  States she feels very well since the hospital discharge.  Has a good appetite and has no weight loss.  Has stopped meloxicam use that she was taking prior to the hospital admission.  Current Outpatient Medications  Medication Sig Dispense Refill  . budesonide-formoterol (SYMBICORT) 160-4.5 MCG/ACT inhaler Inhale 2 puffs daily into the lungs.     . furosemide (LASIX) 20 MG tablet Take 20 mg daily by mouth.     Marland Kitchen lisinopril (PRINIVIL,ZESTRIL) 20 MG tablet Take 20 mg by mouth daily.    . metoprolol succinate (TOPROL-XL) 50 MG 24 hr tablet Take 50 mg by mouth daily. Take with or immediately following a meal.    . potassium chloride (MICRO-K) 10 MEQ CR capsule Take 10 mEq by mouth daily.    Marland Kitchen rOPINIRole (REQUIP) 0.5 MG tablet Take 0.5 mg by mouth at bedtime.    . simvastatin (ZOCOR) 20 MG tablet Take 20 mg by mouth daily at 6 PM.    . SUMAtriptan (IMITREX) 6 MG/0.5ML SOLN injection Inject 6 mg every 2 (two) hours as needed into the skin for migraine or headache. May  repeat in 2 hours if headache persists or recurs.    . SUMAtriptan Succinate (SUMAVEL DOSEPRO) 6 MG/0.5ML SOTJ Inject 1 mL into the skin daily as needed.    . tiotropium (SPIRIVA) 18 MCG inhalation capsule Place 18 mcg into inhaler and inhale daily.    . diphenhydrAMINE (BENADRYL) 25 mg capsule Take 2 capsules (50 mg total) by mouth every 6 (six) hours as needed. (Patient not taking: Reported on 05/22/2017) 60 capsule 0  . ondansetron (ZOFRAN) 4 MG tablet Take 1 tablet (4 mg total) by mouth every 8 (eight) hours as needed for nausea or vomiting. (Patient not taking: Reported on 04/11/2017) 30 tablet 0  . oxyCODONE (OXY IR/ROXICODONE) 5 MG immediate release tablet Take 1 tablet (5 mg total) by mouth every 4 (four) hours as needed for severe pain. (Patient not taking: Reported on 05/22/2017) 60 tablet 0  . pantoprazole (PROTONIX) 40 MG tablet Take 1 tablet (40 mg total) 2 (two) times daily before a meal by mouth. 60 tablet 1   No current facility-administered medications for this visit.     Allergies as of 05/22/2017 - Review Complete 05/22/2017  Allergen Reaction Noted  . Latex Rash 02/03/2015    ROS:  General: Negative for anorexia, weight loss, fever, chills, fatigue, weakness. ENT: Negative for hoarseness, difficulty swallowing , nasal congestion. CV:  Negative for chest pain, angina, palpitations, dyspnea on exertion, peripheral edema.  Respiratory: Negative for dyspnea at rest, dyspnea on exertion, cough, sputum, wheezing.  GI: See history of present illness. GU:  Negative for dysuria, hematuria, urinary incontinence, urinary frequency, nocturnal urination.  Endo: Negative for unusual weight change.    Physical Examination:   BP 117/72   Pulse (!) 111   Wt 176 lb 9.6 oz (80.1 kg)   BMI 29.39 kg/m   General: Well-nourished, well-developed in no acute distress.  Eyes: No icterus. Conjunctivae pink. Mouth: Oropharyngeal mucosa moist and pink , no lesions erythema or  exudate. Lungs: Clear to auscultation bilaterally. Non-labored. Heart: Regular rate and rhythm, no murmurs rubs or gallops.  Abdomen: Bowel sounds are normal, nontender, nondistended, no hepatosplenomegaly or masses, no abdominal bruits or hernia , no rebound or guarding.   Extremities: No lower extremity edema. No clubbing or deformities. Neuro: Alert and oriented x 3.  Grossly intact. Skin: Warm and dry, no jaundice.   Psych: Alert and cooperative, normal mood and affect.   Labs: CMP     Component Value Date/Time   NA 141 04/12/2017 0453   K 3.6 04/12/2017 0453   CL 112 (H) 04/12/2017 0453   CO2 24 04/12/2017 0453   GLUCOSE 103 (H) 04/12/2017 0453   BUN 16 04/12/2017 0453   CREATININE 0.93 04/12/2017 0453   CALCIUM 8.4 (L) 04/12/2017 0453   PROT 6.0 (L) 02/03/2015 0303   ALBUMIN 3.6 02/03/2015 0303   AST 20 02/03/2015 0303   ALT 13 (L) 02/03/2015 0303   ALKPHOS 33 (L) 02/03/2015 0303   BILITOT 0.2 (L) 02/03/2015 0303   GFRNONAA >60 04/12/2017 0453   GFRAA >60 04/12/2017 0453   Lab Results  Component Value Date   WBC 5.5 04/12/2017   HGB 8.0 (L) 04/13/2017   HCT 21.8 (L) 04/12/2017   MCV 73.9 (L) 04/12/2017   PLT 389 04/12/2017    Imaging Studies: No results found.  Assessment and Plan:   Hailey Farrell is a 66 y.o. y/o female here for hospital follow-up for melena and anemia with EGD small gastric ulcers in the setting of NSAID use and biopsies positive for H. pylori  -Gastric ulcers/NSAID/H. pylori Patient is asymptomatic at this time We will refill Protonix and continue this for a total of 8-12 weeks since starting the medication.  January 9 would be 8 weeks.  February 9 would be 12 weeks.  Patient will need a repeat endoscopy as per guidelines for gastric ulcer.  She meets the following criteria for repeat endoscopy: 1. initial endoscopy performed for bleeding 2. Biopsies not performed or inadequate sampling on the index upper endoscopy (due to GI bleeding)  (total of less than 4 biopsies obtained from 4 quadrants of the ulcer and additional biopsies of the edges with jumbo forceps if there are endoscopic features of malignant gastric ulcer) 3. Risks factors for gastric cancer (eg, age >50 years, H. Pylori)  We will continue her Protonix until repeat EGD in 2 months (70months from index EGD) and if no further ulcers can discontinue Protonix at that time.  Risks of PPI use were discussed with patient including bone loss, C. Diff diarrhea, pneumonia, infections, CKD, electrolyte abnormalities. Pt. Verbalizes understanding and chooses to continue the medication.   Avoiding NSAIDs was discussed again with her in detail with her family and the patient.  She verbalized understanding.  Patient had blood work done in the last 1-2 weeks by primary care provider.  We do not have the records of this.  We will try to obtain records to see if anemia has improved since hospital stay.  -Polyp surveillance Patient's last colonoscopy was in February 2015 by Dr. Arther Dames at that time and ascending colon polyp 6 mm and 2 rectal polyps 12 mm and 2 mm were removed and repeat recommended in 3 years.  Pathology showed tubular adenoma.  We will thus schedule for repeat colonoscopy for polyp surveillance with her EGD.  I have discussed alternative options, risks & benefits,  which include, but are not limited to, bleeding, infection, perforation,respiratory complication & drug reaction.  The patient agrees with this plan & written consent will be obtained.      Dr Vonda Antigua

## 2017-05-22 NOTE — Patient Instructions (Addendum)
Prescription for Protonix sent to pharmacy with 2 refills.  Repeat EGD and Colonoscopy in 3 months, scheduled for July 20, 2017 Follow up 6 months.

## 2017-07-19 ENCOUNTER — Encounter: Payer: Self-pay | Admitting: *Deleted

## 2017-07-20 ENCOUNTER — Ambulatory Visit
Admission: RE | Admit: 2017-07-20 | Discharge: 2017-07-20 | Disposition: A | Discharge status: Home / Self Care | Payer: Medicare HMO | Source: Ambulatory Visit | Attending: Gastroenterology | Admitting: Gastroenterology

## 2017-07-20 ENCOUNTER — Encounter: Payer: Self-pay | Admitting: Anesthesiology

## 2017-07-20 ENCOUNTER — Ambulatory Visit: Payer: Medicare HMO | Admitting: Anesthesiology

## 2017-07-20 ENCOUNTER — Encounter
Admission: RE | Disposition: A | Discharge status: Home / Self Care | Payer: Self-pay | Source: Ambulatory Visit | Attending: Gastroenterology

## 2017-07-20 DIAGNOSIS — Z87891 Personal history of nicotine dependence: Secondary | ICD-10-CM | POA: Diagnosis not present

## 2017-07-20 DIAGNOSIS — Z90711 Acquired absence of uterus with remaining cervical stump: Secondary | ICD-10-CM | POA: Diagnosis not present

## 2017-07-20 DIAGNOSIS — K2980 Duodenitis without bleeding: Secondary | ICD-10-CM | POA: Insufficient documentation

## 2017-07-20 DIAGNOSIS — B9681 Helicobacter pylori [H. pylori] as the cause of diseases classified elsewhere: Secondary | ICD-10-CM

## 2017-07-20 DIAGNOSIS — Z96653 Presence of artificial knee joint, bilateral: Secondary | ICD-10-CM | POA: Insufficient documentation

## 2017-07-20 DIAGNOSIS — Z9889 Other specified postprocedural states: Secondary | ICD-10-CM | POA: Diagnosis not present

## 2017-07-20 DIAGNOSIS — Z8719 Personal history of other diseases of the digestive system: Secondary | ICD-10-CM | POA: Diagnosis not present

## 2017-07-20 DIAGNOSIS — K648 Other hemorrhoids: Secondary | ICD-10-CM | POA: Diagnosis not present

## 2017-07-20 DIAGNOSIS — Z8619 Personal history of other infectious and parasitic diseases: Secondary | ICD-10-CM | POA: Diagnosis not present

## 2017-07-20 DIAGNOSIS — K6389 Other specified diseases of intestine: Secondary | ICD-10-CM | POA: Insufficient documentation

## 2017-07-20 DIAGNOSIS — Z1211 Encounter for screening for malignant neoplasm of colon: Secondary | ICD-10-CM | POA: Insufficient documentation

## 2017-07-20 DIAGNOSIS — Z8711 Personal history of peptic ulcer disease: Secondary | ICD-10-CM | POA: Diagnosis not present

## 2017-07-20 DIAGNOSIS — Z79899 Other long term (current) drug therapy: Secondary | ICD-10-CM | POA: Insufficient documentation

## 2017-07-20 DIAGNOSIS — K529 Noninfective gastroenteritis and colitis, unspecified: Secondary | ICD-10-CM | POA: Diagnosis not present

## 2017-07-20 DIAGNOSIS — Z7951 Long term (current) use of inhaled steroids: Secondary | ICD-10-CM | POA: Diagnosis not present

## 2017-07-20 DIAGNOSIS — Z860100 Personal history of colon polyps, unspecified: Secondary | ICD-10-CM

## 2017-07-20 DIAGNOSIS — K2950 Unspecified chronic gastritis without bleeding: Secondary | ICD-10-CM | POA: Diagnosis not present

## 2017-07-20 DIAGNOSIS — D125 Benign neoplasm of sigmoid colon: Secondary | ICD-10-CM | POA: Diagnosis not present

## 2017-07-20 DIAGNOSIS — K635 Polyp of colon: Secondary | ICD-10-CM | POA: Insufficient documentation

## 2017-07-20 DIAGNOSIS — K253 Acute gastric ulcer without hemorrhage or perforation: Secondary | ICD-10-CM | POA: Diagnosis not present

## 2017-07-20 DIAGNOSIS — I10 Essential (primary) hypertension: Secondary | ICD-10-CM | POA: Insufficient documentation

## 2017-07-20 DIAGNOSIS — J449 Chronic obstructive pulmonary disease, unspecified: Secondary | ICD-10-CM | POA: Insufficient documentation

## 2017-07-20 DIAGNOSIS — K269 Duodenal ulcer, unspecified as acute or chronic, without hemorrhage or perforation: Secondary | ICD-10-CM | POA: Diagnosis not present

## 2017-07-20 DIAGNOSIS — Z91040 Latex allergy status: Secondary | ICD-10-CM | POA: Insufficient documentation

## 2017-07-20 DIAGNOSIS — Z853 Personal history of malignant neoplasm of breast: Secondary | ICD-10-CM | POA: Insufficient documentation

## 2017-07-20 DIAGNOSIS — G2581 Restless legs syndrome: Secondary | ICD-10-CM | POA: Diagnosis not present

## 2017-07-20 HISTORY — PX: COLONOSCOPY WITH PROPOFOL: SHX5780

## 2017-07-20 HISTORY — PX: ESOPHAGOGASTRODUODENOSCOPY (EGD) WITH PROPOFOL: SHX5813

## 2017-07-20 SURGERY — COLONOSCOPY WITH PROPOFOL
Anesthesia: General

## 2017-07-20 MED ORDER — PROPOFOL 10 MG/ML IV BOLUS
INTRAVENOUS | Status: AC
  Filled 2017-07-20: qty 20

## 2017-07-20 MED ORDER — LIDOCAINE HCL (PF) 1 % IJ SOLN
INTRAMUSCULAR | Status: AC
  Administered 2017-07-20: 0.3 mL via INTRADERMAL
  Filled 2017-07-20: qty 2

## 2017-07-20 MED ORDER — PHENYLEPHRINE HCL 10 MG/ML IJ SOLN
INTRAMUSCULAR | Status: AC
  Filled 2017-07-20: qty 1

## 2017-07-20 MED ORDER — BUTAMBEN-TETRACAINE-BENZOCAINE 2-2-14 % EX AERO
INHALATION_SPRAY | CUTANEOUS | Status: AC
  Filled 2017-07-20: qty 5

## 2017-07-20 MED ORDER — LIDOCAINE HCL (PF) 1 % IJ SOLN
2.0000 mL | Freq: Once | INTRAMUSCULAR | Status: AC
  Administered 2017-07-20: 0.3 mL via INTRADERMAL

## 2017-07-20 MED ORDER — FENTANYL CITRATE (PF) 100 MCG/2ML IJ SOLN
INTRAMUSCULAR | Status: AC
  Filled 2017-07-20: qty 2

## 2017-07-20 MED ORDER — PROPOFOL 500 MG/50ML IV EMUL
INTRAVENOUS | Status: DC | PRN
  Administered 2017-07-20: 100 ug/kg/min via INTRAVENOUS

## 2017-07-20 MED ORDER — LIDOCAINE HCL (PF) 2 % IJ SOLN
INTRAMUSCULAR | Status: AC
Start: 2017-07-20 — End: ?
  Filled 2017-07-20: qty 10

## 2017-07-20 MED ORDER — FENTANYL CITRATE (PF) 100 MCG/2ML IJ SOLN
INTRAMUSCULAR | Status: DC | PRN
  Administered 2017-07-20: 50 ug via INTRAVENOUS

## 2017-07-20 MED ORDER — MIDAZOLAM HCL 2 MG/2ML IJ SOLN
INTRAMUSCULAR | Status: DC | PRN
  Administered 2017-07-20: 1 mg via INTRAVENOUS

## 2017-07-20 MED ORDER — PHENYLEPHRINE HCL 10 MG/ML IJ SOLN
INTRAMUSCULAR | Status: DC | PRN
  Administered 2017-07-20 (×7): 50 ug via INTRAVENOUS

## 2017-07-20 MED ORDER — SODIUM CHLORIDE 0.9 % IV SOLN
INTRAVENOUS | Status: DC
  Administered 2017-07-20: 1000 mL via INTRAVENOUS

## 2017-07-20 MED ORDER — LIDOCAINE HCL (CARDIAC) 20 MG/ML IV SOLN
INTRAVENOUS | Status: DC | PRN
  Administered 2017-07-20: 30 mg via INTRAVENOUS

## 2017-07-20 MED ORDER — MIDAZOLAM HCL 2 MG/2ML IJ SOLN
INTRAMUSCULAR | Status: AC
  Filled 2017-07-20: qty 2

## 2017-07-20 MED ORDER — PROPOFOL 500 MG/50ML IV EMUL
INTRAVENOUS | Status: AC
  Filled 2017-07-20: qty 50

## 2017-07-20 MED ORDER — PANTOPRAZOLE SODIUM 20 MG PO TBEC: 20.0000 mg | DELAYED_RELEASE_TABLET | Freq: Every day | ORAL | 0 refills | Status: DC

## 2017-07-20 NOTE — H&P (Signed)
Vonda Antigua, MD [address] [place], [place], [place], [address] [address], [place], [place], [place], [address] Phone: [phone]  Fax: [phone]  Primary Care Physician:  Letta Median, MD   Pre-Procedure History & Physical: HPI:  Hailey Farrell is a 67 y.o. female is here for a colonoscopy and EGD.    Past Medical History:  Diagnosis Date  . Breast cancer ([place])    Right  . COPD (chronic obstructive pulmonary disease) ([place])   . Elevated lipids   . Hypertension   . Lower extremity edema   . Migraines   . Migraines   . Restless leg syndrome   . Rotator cuff injury     Past Surgical History:  Procedure Laterality Date  . ABDOMINAL HYSTERECTOMY    . BREAST BIOPSY Right   . ESOPHAGOGASTRODUODENOSCOPY Left 04/13/2017   Procedure: ESOPHAGOGASTRODUODENOSCOPY (EGD);  Surgeon: Virgel Manifold, MD;  Location: [facility];  Service: Endoscopy;  Laterality: Left;  . JOINT REPLACEMENT     Lt knee  . MEDIAL PARTIAL KNEE REPLACEMENT    . PARTIAL HYSTERECTOMY    . SHOULDER ARTHROSCOPY WITH OPEN ROTATOR CUFF REPAIR Left 03/01/2016   Procedure: SHOULDER ARTHROSCOPY WITH OPEN ROTATOR CUFF REPAIR;  Surgeon: Thornton Park, MD;  Location: [facility] ORS;  Service: Orthopedics;  Laterality: Left;    Prior to Admission medications   Medication Sig Start Date End Date Taking? Authorizing Provider  budesonide-formoterol (SYMBICORT) 160-4.5 MCG/ACT inhaler Inhale 2 puffs daily into the lungs.     [provider]  diphenhydrAMINE (BENADRYL) 25 mg capsule Take 2 capsules (50 mg total) by mouth every 6 (six) hours as needed. Patient not taking: Reported on 05/22/2017 03/17/15   Carrie Mew, MD  furosemide (LASIX) 20 MG tablet Take 20 mg daily by mouth.     [provider]  lisinopril (PRINIVIL,ZESTRIL) 20 MG tablet Take 20 mg by mouth daily.    [provider]  metoprolol succinate (TOPROL-XL) 50 MG 24 hr tablet Take 50 mg by mouth  daily. Take with or immediately following a meal.    [provider]  ondansetron (ZOFRAN) 4 MG tablet Take 1 tablet (4 mg total) by mouth every 8 (eight) hours as needed for nausea or vomiting. Patient not taking: Reported on 04/11/2017 03/01/16   Thornton Park, MD  oxyCODONE (OXY IR/ROXICODONE) 5 MG immediate release tablet Take 1 tablet (5 mg total) by mouth every 4 (four) hours as needed for severe pain. Patient not taking: Reported on 05/22/2017 03/01/16   Thornton Park, MD  pantoprazole (PROTONIX) 40 MG tablet Take 1 tablet (40 mg total) by mouth 2 (two) times daily before a meal. Patient not taking: Reported on 07/20/2017 05/22/17   Virgel Manifold, MD  pantoprazole (PROTONIX) 40 MG tablet Take 40 mg by mouth 2 (two) times daily before a meal. 05/22/17   Vonda Antigua B, MD  potassium chloride (MICRO-K) 10 MEQ CR capsule Take 10 mEq by mouth daily.    [provider]  rOPINIRole (REQUIP) 0.5 MG tablet Take 0.5 mg by mouth at bedtime.    [provider]  simvastatin (ZOCOR) 20 MG tablet Take 20 mg by mouth daily at 6 PM.    [provider]  SUMAtriptan (IMITREX) 6 MG/0.5ML SOLN injection Inject 6 mg every 2 (two) hours as needed into the skin for migraine or headache. May repeat in 2 hours if headache persists or recurs.    [provider]  SUMAtriptan Succinate (SUMAVEL DOSEPRO)  6 MG/0.5ML SOTJ Inject 1 mL into the skin daily as needed.    [provider]  tiotropium (SPIRIVA) 18 MCG inhalation capsule Place 18 mcg into inhaler and inhale daily.    [provider]    Allergies as of 05/22/2017 - Review Complete 05/22/2017  Allergen Reaction Noted  . Latex Rash 02/03/2015    History reviewed. No pertinent family history.  Social History   Socioeconomic History  . Marital status: Married    Spouse name: Not on file  . Number of children: Not on file  . Years of education: Not on file  . Highest education  level: Not on file  Social Needs  . Financial resource strain: Not on file  . Food insecurity - worry: Not on file  . Food insecurity - inability: Not on file  . Transportation needs - medical: Not on file  . Transportation needs - non-medical: Not on file  Occupational History  . Not on file  Tobacco Use  . Smoking status: Former Smoker    Packs/day: 0.25    Years: 50.00    Pack years: 12.50    Types: Cigarettes    Last attempt to quit: 03/13/2016    Years since quitting: 1.3  . Smokeless tobacco: Never Used  Substance and Sexual Activity  . Alcohol use: No  . Drug use: No  . Sexual activity: Not on file  Other Topics Concern  . Not on file  Social History Narrative  . Not on file    Review of Systems: See HPI, otherwise negative ROS  Physical Exam: BP 107/63   Pulse (!) 104   Temp (!) 96.9 F (36.1 C) (Tympanic)   Resp 17   Ht 5\' 3"  (1.6 m)   Wt 170 lb (77.1 kg)   SpO2 100%   BMI 30.11 kg/m  General:   Alert,  pleasant and cooperative in NAD Head:  Normocephalic and atraumatic. Neck:  Supple; no masses or thyromegaly. Lungs:  Clear throughout to auscultation, normal respiratory effort.    Heart:  +S1, +S2, Regular rate and rhythm, No edema. Abdomen:  Soft, nontender and nondistended. Normal bowel sounds, without guarding, and without rebound.   Neurologic:  Alert and  oriented x4;  grossly normal neurologically.  Impression/Plan: Hailey Farrell is here for a colonoscopy to be performed for polyp surveillance and EGD for gastric ulcer  Risks, benefits, limitations, and alternatives regarding  colonoscopy have been reviewed with the patient.  Questions have been answered.  All parties agreeable.   Virgel Manifold, MD  07/20/2017, 8:15 AM

## 2017-07-20 NOTE — Op Note (Signed)
[facility] Gastroenterology Patient Name: Hailey Farrell Procedure Date: 07/20/2017 7:41 AM MRN: [id number] Account #: [id number] Date of Birth: [birth year] Admit Type: Outpatient Age: 67 Room: [facility] ENDO ROOM 3 Gender: Female Note Status: Finalized Procedure:            Upper GI endoscopy Indications:          Follow-up of gastric ulcer Providers:            Varnita B. Bonna Gains MD, MD Referring MD:         Baxter Kail. Rebeca Alert MD, MD (Referring MD) Medicines:            Monitored Anesthesia Care Complications:        No immediate complications. Procedure:            Pre-Anesthesia Assessment:                       - The risks and benefits of the procedure and the                        sedation options and risks were discussed with the                        patient. All questions were answered and informed                        consent was obtained.                       - Patient identification and proposed procedure were                        verified prior to the procedure.                       - ASA Grade Assessment: III - A patient with severe                        systemic disease.                       After obtaining informed consent, the endoscope was                        passed under direct vision. Throughout the procedure,                        the patient's blood pressure, pulse, and oxygen                        saturations were monitored continuously. The Endoscope                        was introduced through the mouth, and advanced to the                        second part of duodenum. The upper GI endoscopy was                        accomplished with ease. The patient tolerated the  procedure well. Findings:      The examined esophagus was normal.      The entire examined stomach was normal. Biopsies were obtained in the       gastric body, at the incisura and in the gastric antrum with cold       forceps for histology.      One to two non-bleeding superficial duodenal ulcers with no stigmata of       bleeding was found in the duodenal bulb. Biopsies were taken with a cold       forceps for histology. The ulcer is healing and improved compared to       prior procedure in Nov 2018.      The second portion of the duodenum was normal. Impression:           - Normal esophagus.                       - Normal stomach. Previously seen gastric ulcers are                        not present.                       - One to two non-bleeding duodenal ulcer with no                        stigmata of bleeding. Biopsied.                       - Biopsies were obtained in the gastric body, at the                        incisura and in the gastric antrum. These were done to                        assess for eradication of H. Pylori Recommendation:       - Use Protonix (pantoprazole) 20 mg PO daily for 1                        month and then discontinue.                       - Await pathology results.                       - Stop NSAID use (like Ibuprofen, Aleeve, Motrin,                        Advil, Meloxicam, Goodie Powder, BC powder etc.) except                        for Aspirin if medically indicated by PCP or cardiology                       - Return to my office as previously scheduled.                       - Return to primary care physician as previously                        scheduled.                       -  The findings and recommendations were discussed with                        the patient.                       - The findings and recommendations were discussed with                        the patient's family. Procedure Code(s):    --- Professional ---                       [phone], Esophagogastroduodenoscopy, flexible, transoral;                        with biopsy, single or multiple Diagnosis Code(s):    --- Professional ---                       K26.9, Duodenal ulcer, unspecified as acute or chronic,                         without hemorrhage or perforation                       K25.9, Gastric ulcer, unspecified as acute or chronic,                        without hemorrhage or perforation CPT copyright 2016 American Medical Association. All rights reserved. The codes documented in this report are preliminary and upon coder review may  be revised to meet current compliance requirements.  Vonda Antigua, MD Margretta Sidle B. Bonna Gains MD, MD 07/20/2017 8:43:09 AM This report has been signed electronically. Number of Addenda: 0 Note Initiated On: 07/20/2017 7:41 AM      [facility]

## 2017-07-20 NOTE — Anesthesia Preprocedure Evaluation (Addendum)
Anesthesia Evaluation  Patient identified by MRN, date of birth, ID band Patient awake    Reviewed: Allergy & Precautions, NPO status , Patient's Chart, lab work & pertinent test results, reviewed documented beta blocker date and time   Airway Mallampati: III  TM Distance: >3 FB     Dental  (+) Chipped, Partial Lower, Partial Upper, Poor Dentition, Dental Advisory Given   Pulmonary COPD, former smoker,           Cardiovascular hypertension, Pt. on medications and Pt. on home beta blockers + dysrhythmias      Neuro/Psych  Headaches,    GI/Hepatic   Endo/Other    Renal/GU      Musculoskeletal   Abdominal   Peds  Hematology   Anesthesia Other Findings   Reproductive/Obstetrics                            Anesthesia Physical Anesthesia Plan  ASA: III  Anesthesia Plan: General   Post-op Pain Management:    Induction: Intravenous  PONV Risk Score and Plan:   Airway Management Planned:   Additional Equipment:   Intra-op Plan:   Post-operative Plan:   Informed Consent: I have reviewed the patients History and Physical, chart, labs and discussed the procedure including the risks, benefits and alternatives for the proposed anesthesia with the patient or authorized representative who has indicated his/her understanding and acceptance.     Plan Discussed with: CRNA  Anesthesia Plan Comments:         Anesthesia Quick Evaluation

## 2017-07-20 NOTE — Anesthesia Postprocedure Evaluation (Signed)
Anesthesia Post Note  Patient: [name]  Procedure(s) Performed: COLONOSCOPY WITH PROPOFOL (N/A ) ESOPHAGOGASTRODUODENOSCOPY (EGD) WITH PROPOFOL (N/A )  Patient location during evaluation: Endoscopy Anesthesia Type: General Level of consciousness: awake and alert Pain management: pain level controlled Vital Signs Assessment: post-procedure vital signs reviewed and stable Respiratory status: spontaneous breathing, nonlabored ventilation, respiratory function stable and patient connected to nasal cannula oxygen Cardiovascular status: blood pressure returned to baseline and stable Postop Assessment: no apparent nausea or vomiting Anesthetic complications: no     Last Vitals:  Vitals:   07/20/17 0925 07/20/17 0935  BP: (!) 93/28 99/62  Pulse: 78   Resp:    Temp:    SpO2: 100%     Last Pain:  Vitals:   07/20/17 0925  TempSrc: Tympanic                 [name] S

## 2017-07-20 NOTE — Op Note (Signed)
[facility] Gastroenterology Patient Name: Hailey Farrell Procedure Date: 07/20/2017 7:40 AM MRN: [id number] Account #: [id number] Date of Birth: 1951/01/20 Admit Type: Outpatient Age: 67 Room: [facility] ENDO ROOM 3 Gender: Female Note Status: Finalized Procedure:            Colonoscopy Indications:          Screening for colorectal malignant neoplasm, High risk                        colon cancer surveillance: Personal history of colonic                        polyps Providers:            Stefan Karen B. Bonna Gains MD, MD Medicines:            Monitored Anesthesia Care Complications:        No immediate complications. Procedure:            Pre-Anesthesia Assessment:                       - ASA Grade Assessment: III - A patient with severe                        systemic disease.                       - Prior to the procedure, a History and Physical was                        performed, and patient medications, allergies and                        sensitivities were reviewed. The patient's tolerance of                        previous anesthesia was reviewed.                       - The risks and benefits of the procedure and the                        sedation options and risks were discussed with the                        patient. All questions were answered and informed                        consent was obtained.                       - Patient identification and proposed procedure were                        verified prior to the procedure by the physician, the                        nurse, the anesthesiologist, the anesthetist and the                        technician. The procedure was verified in the procedure  room.                       After obtaining informed consent, the colonoscope was                        passed under direct vision. Throughout the procedure,                        the patient's blood pressure, pulse, and oxygen                saturations were monitored continuously. The                        Colonoscope was introduced through the anus and                        advanced to the the cecum, identified by appendiceal                        orifice and ileocecal valve. The colonoscopy was                        performed with ease. The patient tolerated the                        procedure well. The quality of the bowel preparation                        was good. Findings:      The perianal and digital rectal examinations were normal.      A 5 mm polyp was found in the sigmoid colon. The polyp was sessile. The       polyp was removed with a cold snare. Resection and retrieval were       complete.      A 2 mm polyp was found in the sigmoid colon. The polyp was flat and       hyperplastic. The polyp was removed with a cold biopsy forceps.       Resection and retrieval were complete.      A localized area of thickened folds of the mucosa was found in the       cecum. Biopsies were taken with a cold forceps for histology. Pillow       sign was positive and this may represent a lipoma.      Internal hemorrhoids were found during retroflexion.      The exam was otherwise without abnormality. Impression:           - One 5 mm polyp in the sigmoid colon, removed with a                        cold snare. Resected and retrieved.                       - One 2 mm polyp in the sigmoid colon, removed with a                        cold biopsy forceps. Resected and retrieved.                       -  Thickened folds of the mucosa in the cecum. Biopsied.                       - Internal hemorrhoids.                       - The examination was otherwise normal. Recommendation:       - Discharge patient to home (with escort).                       - Advance diet as tolerated.                       - Continue present medications.                       - Await pathology results.                       - Repeat  colonoscopy in 5 years for surveillance.                       - The findings and recommendations were discussed with                        the patient.                       - The findings and recommendations were discussed with                        the patient's family.                       - Return to primary care physician as previously                        scheduled.                       - High fiber diet. Procedure Code(s):    --- Professional ---                       [phone], Colonoscopy, flexible; with removal of tumor(s),                        polyp(s), or other lesion(s) by snare technique                       [id number], 40, Colonoscopy, flexible; with biopsy, single                        or multiple Diagnosis Code(s):    --- Professional ---                       Z12.11, Encounter for screening for malignant neoplasm                        of colon                       Z86.010, Personal history of colonic polyps  D12.5, Benign neoplasm of sigmoid colon                       K63.89, Other specified diseases of intestine                       K64.8, Other hemorrhoids CPT copyright 2016 American Medical Association. All rights reserved. The codes documented in this report are preliminary and upon coder review may  be revised to meet current compliance requirements.  Vonda Antigua, MD Margretta Sidle B. Bonna Gains MD, MD 07/20/2017 9:26:45 AM This report has been signed electronically. Number of Addenda: 0 Note Initiated On: 07/20/2017 7:40 AM Scope Withdrawal Time: 0 hours 21 minutes 44 seconds  Total Procedure Duration: 0 hours 32 minutes 34 seconds  Estimated Blood Loss: Estimated blood loss: none.      [facility]

## 2017-07-20 NOTE — Anesthesia Procedure Notes (Signed)
Performed by: Cook-Martin, Farrah Skoda Pre-anesthesia Checklist: Patient identified, Suction available, Emergency Drugs available, Patient being monitored and Timeout performed Patient Re-evaluated:Patient Re-evaluated prior to induction Oxygen Delivery Method: Nasal cannula Preoxygenation: Pre-oxygenation with 100% oxygen Induction Type: IV induction Placement Confirmation: positive ETCO2 and CO2 detector       

## 2017-07-20 NOTE — Anesthesia Post-op Follow-up Note (Signed)
Anesthesia QCDR form completed.        

## 2017-07-20 NOTE — Transfer of Care (Signed)
Immediate Anesthesia Transfer of Care Note  Patient: Hailey Farrell  Procedure(s) Performed: COLONOSCOPY WITH PROPOFOL (N/A ) ESOPHAGOGASTRODUODENOSCOPY (EGD) WITH PROPOFOL (N/A )  Patient Location: PACU  Anesthesia Type:General  Level of Consciousness: awake and sedated  Airway & Oxygen Therapy: Patient Spontanous Breathing and Patient connected to nasal cannula oxygen  Post-op Assessment: Report given to RN and Post -op Vital signs reviewed and stable  Post vital signs: Reviewed and stable  Last Vitals:  Vitals:   07/20/17 0713  BP: 107/63  Pulse: (!) 104  Resp: 17  Temp: (!) 36.1 C  SpO2: 100%    Last Pain:  Vitals:   07/20/17 0713  TempSrc: Tympanic         Complications: No apparent anesthesia complications

## 2017-07-21 NOTE — Progress Notes (Signed)
Gentleman answered and did not give name.  He said patient was sleeping.  No message left.

## 2017-07-23 ENCOUNTER — Encounter: Payer: Self-pay | Admitting: Gastroenterology

## 2017-07-24 LAB — SURGICAL PATHOLOGY

## 2017-07-25 ENCOUNTER — Encounter: Payer: Self-pay | Admitting: Gastroenterology

## 2017-07-25 ENCOUNTER — Other Ambulatory Visit: Payer: Self-pay | Admitting: Gastroenterology

## 2017-07-25 DIAGNOSIS — A048 Other specified bacterial intestinal infections: Secondary | ICD-10-CM

## 2017-07-25 MED ORDER — PANTOPRAZOLE SODIUM 20 MG PO TBEC: 20.0000 mg | DELAYED_RELEASE_TABLET | Freq: Two times a day (BID) | ORAL | 0 refills | Status: DC

## 2017-07-25 MED ORDER — BIS SUBCIT-METRONID-TETRACYC 140-125-125 MG PO CAPS: 3.0000 | ORAL_CAPSULE | Freq: Four times a day (QID) | ORAL | 0 refills | Status: DC

## 2017-07-26 ENCOUNTER — Telehealth: Payer: Self-pay

## 2017-07-26 NOTE — Telephone Encounter (Signed)
Patient has been informed the following message from Dr. Bonna Gains regarding results:       1. her biopsies showed that the H Pylori bacteria that was treated after her last biopsies is still present  2. She needs a different course of antibiotics to treat the bacteria  3. Rx has been  to her pharmacy. One of the medications is a combination antibiotic, and if this is not being covered by her insurance to call us and we can prescribe it separately instead of a combination  4. She will need to take her protonix twice a day for 14 days and I have sent this to the pharmacy as well. She may use the bottle of protonix she already has at home but she needs to take it twice a day. She stated that she will use what she has at home- she is still taking.  5. Informed her that she can expect a call with her appt for follow up by our staff for 3-4 weeks.  Thanks [employer]

## 2017-08-02 ENCOUNTER — Telehealth: Payer: Self-pay

## 2017-08-02 ENCOUNTER — Other Ambulatory Visit: Payer: Self-pay

## 2017-08-02 MED ORDER — TETRACYCLINE HCL 500 MG PO CAPS: 500.0000 mg | ORAL_CAPSULE | Freq: Four times a day (QID) | ORAL | 0 refills | Status: DC

## 2017-08-02 MED ORDER — METRONIDAZOLE 250 MG PO TABS: 250.0000 mg | ORAL_TABLET | Freq: Four times a day (QID) | ORAL | 0 refills | Status: DC

## 2017-08-02 MED ORDER — BISMUTH SUBSALICYLATE 262 MG PO TABS: 2.0000 | ORAL_TABLET | Freq: Four times a day (QID) | ORAL | 0 refills | Status: DC

## 2017-08-02 NOTE — Telephone Encounter (Signed)
Bismuth quadruple therapy - Bismuth quadruple therapy consists of bismuth subsalicylate, metronidazole, tetracycline, and a PPI given for 14 days [18]. A combination capsule containing bismuth subcitrate, metronidazole, and tetracycline (Pylera) has been approved by the [place]. A regimen using the combination capsule (three capsules four times daily plus PPI twice daily) is somewhat simpler than standard quadruple therapy (four to eight pills four times daily and a PPI twice daily). For details, refer to bismuth subcitrate-metronidazole-tetracycline in [employer]. If tetracycline is not available, doxycycline (100 mg twice daily) may be substituted [19,20].  Link: [email or web address] Dr. Vicente Males had suggested the doxycycline since they need prior authorization for the Tetracycline. Is this okay with you? Rana Adorno Also when I called the pt to let her know what was going on with her medication she states her Protonix even at 20mg  makes her dizzy. Please advise. Thank you.

## 2017-08-02 NOTE — Telephone Encounter (Signed)
Insurance does not cover Pylera. Dr. Bonna Gains

## 2017-08-03 HISTORY — PX: BACK SURGERY: SHX140

## 2017-08-03 MED ORDER — OMEPRAZOLE 20 MG PO CPDR: 20.0000 mg | DELAYED_RELEASE_CAPSULE | Freq: Two times a day (BID) | ORAL | 0 refills | Status: DC

## 2017-08-03 MED ORDER — DOXYCYCLINE HYCLATE 100 MG PO TABS: 100.0000 mg | ORAL_TABLET | Freq: Two times a day (BID) | ORAL | 0 refills | Status: DC

## 2017-08-03 NOTE — Addendum Note (Signed)
Addended by: Earl Lagos on: 08/03/2017 03:42 PM   Modules accepted: Orders

## 2017-08-03 NOTE — Telephone Encounter (Signed)
Doxycycline ordered-no interactions and protonix discontinued and omeprazole ordered. Pt is aware.

## 2017-08-06 ENCOUNTER — Encounter: Payer: Self-pay | Admitting: Emergency Medicine

## 2017-08-06 ENCOUNTER — Other Ambulatory Visit: Payer: Self-pay

## 2017-08-06 ENCOUNTER — Inpatient Hospital Stay: Payer: Medicare HMO

## 2017-08-06 ENCOUNTER — Inpatient Hospital Stay
Admission: EM | Admit: 2017-08-06 | Discharge: 2017-08-08 | DRG: 812 | Disposition: A | Discharge status: Home / Self Care | Payer: Medicare HMO | Attending: Internal Medicine | Admitting: Internal Medicine

## 2017-08-06 DIAGNOSIS — Z96652 Presence of left artificial knee joint: Secondary | ICD-10-CM | POA: Diagnosis present

## 2017-08-06 DIAGNOSIS — G43909 Migraine, unspecified, not intractable, without status migrainosus: Secondary | ICD-10-CM | POA: Diagnosis present

## 2017-08-06 DIAGNOSIS — Z7951 Long term (current) use of inhaled steroids: Secondary | ICD-10-CM

## 2017-08-06 DIAGNOSIS — R42 Dizziness and giddiness: Secondary | ICD-10-CM

## 2017-08-06 DIAGNOSIS — K922 Gastrointestinal hemorrhage, unspecified: Secondary | ICD-10-CM

## 2017-08-06 DIAGNOSIS — K558 Other vascular disorders of intestine: Secondary | ICD-10-CM | POA: Diagnosis not present

## 2017-08-06 DIAGNOSIS — I10 Essential (primary) hypertension: Secondary | ICD-10-CM | POA: Diagnosis present

## 2017-08-06 DIAGNOSIS — F17210 Nicotine dependence, cigarettes, uncomplicated: Secondary | ICD-10-CM | POA: Diagnosis present

## 2017-08-06 DIAGNOSIS — J449 Chronic obstructive pulmonary disease, unspecified: Secondary | ICD-10-CM | POA: Diagnosis present

## 2017-08-06 DIAGNOSIS — Z853 Personal history of malignant neoplasm of breast: Secondary | ICD-10-CM | POA: Diagnosis not present

## 2017-08-06 DIAGNOSIS — Z923 Personal history of irradiation: Secondary | ICD-10-CM | POA: Diagnosis not present

## 2017-08-06 DIAGNOSIS — Z8249 Family history of ischemic heart disease and other diseases of the circulatory system: Secondary | ICD-10-CM | POA: Diagnosis not present

## 2017-08-06 DIAGNOSIS — K5520 Angiodysplasia of colon without hemorrhage: Secondary | ICD-10-CM | POA: Diagnosis present

## 2017-08-06 DIAGNOSIS — R0602 Shortness of breath: Secondary | ICD-10-CM | POA: Diagnosis present

## 2017-08-06 DIAGNOSIS — E876 Hypokalemia: Secondary | ICD-10-CM | POA: Diagnosis present

## 2017-08-06 DIAGNOSIS — Z91040 Latex allergy status: Secondary | ICD-10-CM | POA: Diagnosis not present

## 2017-08-06 DIAGNOSIS — K2970 Gastritis, unspecified, without bleeding: Secondary | ICD-10-CM | POA: Diagnosis present

## 2017-08-06 DIAGNOSIS — D62 Acute posthemorrhagic anemia: Secondary | ICD-10-CM | POA: Diagnosis present

## 2017-08-06 DIAGNOSIS — Z8711 Personal history of peptic ulcer disease: Secondary | ICD-10-CM

## 2017-08-06 DIAGNOSIS — K31819 Angiodysplasia of stomach and duodenum without bleeding: Secondary | ICD-10-CM | POA: Diagnosis present

## 2017-08-06 DIAGNOSIS — B9681 Helicobacter pylori [H. pylori] as the cause of diseases classified elsewhere: Secondary | ICD-10-CM | POA: Diagnosis present

## 2017-08-06 DIAGNOSIS — R55 Syncope and collapse: Secondary | ICD-10-CM

## 2017-08-06 DIAGNOSIS — E538 Deficiency of other specified B group vitamins: Secondary | ICD-10-CM | POA: Diagnosis present

## 2017-08-06 DIAGNOSIS — Z90711 Acquired absence of uterus with remaining cervical stump: Secondary | ICD-10-CM

## 2017-08-06 DIAGNOSIS — G2581 Restless legs syndrome: Secondary | ICD-10-CM | POA: Diagnosis present

## 2017-08-06 DIAGNOSIS — D649 Anemia, unspecified: Secondary | ICD-10-CM

## 2017-08-06 DIAGNOSIS — D500 Iron deficiency anemia secondary to blood loss (chronic): Secondary | ICD-10-CM | POA: Diagnosis not present

## 2017-08-06 LAB — CBC
HCT: 12.9 % — CL (ref 35.0–47.0)
HEMOGLOBIN: 3.7 g/dL — AB (ref 12.0–16.0)
MCH: 17.7 pg — ABNORMAL LOW (ref 26.0–34.0)
MCHC: 28.6 g/dL — ABNORMAL LOW (ref 32.0–36.0)
MCV: 61.9 fL — AB (ref 80.0–100.0)
PLATELETS: 456 10*3/uL — AB (ref 150–440)
RBC: 2.08 MIL/uL — AB (ref 3.80–5.20)
RDW: 24.6 % — ABNORMAL HIGH (ref 11.5–14.5)
WBC: 4.9 10*3/uL (ref 3.6–11.0)

## 2017-08-06 LAB — URINALYSIS, COMPLETE (UACMP) WITH MICROSCOPIC
BILIRUBIN URINE: NEGATIVE
Bacteria, UA: NONE SEEN
GLUCOSE, UA: NEGATIVE mg/dL
HGB URINE DIPSTICK: NEGATIVE
KETONES UR: NEGATIVE mg/dL
NITRITE: NEGATIVE
PH: 5 (ref 5.0–8.0)
Protein, ur: NEGATIVE mg/dL
SPECIFIC GRAVITY, URINE: 1.014 (ref 1.005–1.030)

## 2017-08-06 LAB — COMPREHENSIVE METABOLIC PANEL
ALBUMIN: 3.5 g/dL (ref 3.5–5.0)
ALT: 8 U/L — ABNORMAL LOW (ref 14–54)
AST: 17 U/L (ref 15–41)
Alkaline Phosphatase: 32 U/L — ABNORMAL LOW (ref 38–126)
Anion gap: 10 (ref 5–15)
BILIRUBIN TOTAL: 0.5 mg/dL (ref 0.3–1.2)
BUN: 15 mg/dL (ref 6–20)
CHLORIDE: 107 mmol/L (ref 101–111)
CO2: 23 mmol/L (ref 22–32)
Calcium: 8.7 mg/dL — ABNORMAL LOW (ref 8.9–10.3)
Creatinine, Ser: 0.75 mg/dL (ref 0.44–1.00)
GFR calc non Af Amer: >60 mL/min (ref >60)
Glucose, Bld: 103 mg/dL — ABNORMAL HIGH (ref 65–99)
Potassium: 3.3 mmol/L — ABNORMAL LOW (ref 3.5–5.1)
SODIUM: 140 mmol/L (ref 135–145)
TOTAL PROTEIN: 6 g/dL — AB (ref 6.5–8.1)

## 2017-08-06 LAB — PROTIME-INR
INR: 1.21
PROTHROMBIN TIME: 15.2 s (ref 11.4–15.2)

## 2017-08-06 LAB — PREPARE RBC (CROSSMATCH)

## 2017-08-06 LAB — APTT: aPTT: 29 seconds (ref 24–36)

## 2017-08-06 LAB — TROPONIN I: Troponin I: <0.03 ng/mL (ref <0.03)

## 2017-08-06 LAB — FERRITIN: Ferritin: 3 ng/mL — ABNORMAL LOW (ref 11–307)

## 2017-08-06 MED ORDER — SIMVASTATIN 20 MG PO TABS
20.0000 mg | ORAL_TABLET | Freq: Every day | ORAL | Status: DC
  Administered 2017-08-07: 20 mg via ORAL
  Filled 2017-08-06 (×2): qty 2

## 2017-08-06 MED ORDER — ACETAMINOPHEN 650 MG RE SUPP: 650.0000 mg | Freq: Four times a day (QID) | RECTAL | Status: DC | PRN

## 2017-08-06 MED ORDER — FUROSEMIDE 20 MG PO TABS
20.0000 mg | ORAL_TABLET | Freq: Every day | ORAL | Status: DC
  Administered 2017-08-07: 09:00:00 20 mg via ORAL
  Filled 2017-08-06 (×2): qty 1

## 2017-08-06 MED ORDER — MOMETASONE FURO-FORMOTEROL FUM 200-5 MCG/ACT IN AERO
2.0000 | INHALATION_SPRAY | Freq: Two times a day (BID) | RESPIRATORY_TRACT | Status: DC
  Administered 2017-08-07 – 2017-08-08 (×3): 2 via RESPIRATORY_TRACT
  Filled 2017-08-06: qty 8.8

## 2017-08-06 MED ORDER — ROPINIROLE HCL 1 MG PO TABS
0.5000 mg | ORAL_TABLET | Freq: Every day | ORAL | Status: DC
  Administered 2017-08-07: 21:00:00 0.5 mg via ORAL
  Filled 2017-08-06: qty 1

## 2017-08-06 MED ORDER — ONDANSETRON HCL 4 MG/2ML IJ SOLN: 4.0000 mg | Freq: Four times a day (QID) | INTRAMUSCULAR | Status: DC | PRN

## 2017-08-06 MED ORDER — SODIUM CHLORIDE 0.9 % IV SOLN
80.0000 mg | Freq: Once | INTRAVENOUS | Status: AC
  Administered 2017-08-06: 80 mg via INTRAVENOUS
  Filled 2017-08-06: qty 80

## 2017-08-06 MED ORDER — METOPROLOL SUCCINATE ER 50 MG PO TB24
50.0000 mg | ORAL_TABLET | Freq: Every day | ORAL | Status: DC
  Administered 2017-08-07: 09:00:00 50 mg via ORAL
  Filled 2017-08-06 (×2): qty 1

## 2017-08-06 MED ORDER — PANTOPRAZOLE SODIUM 40 MG IV SOLR: 40.0000 mg | Freq: Two times a day (BID) | INTRAVENOUS | Status: DC

## 2017-08-06 MED ORDER — SODIUM CHLORIDE 0.9 % IV SOLN
10.0000 mL/h | Freq: Once | INTRAVENOUS | Status: AC
  Administered 2017-08-06: 10 mL/h via INTRAVENOUS

## 2017-08-06 MED ORDER — TIOTROPIUM BROMIDE MONOHYDRATE 18 MCG IN CAPS
18.0000 ug | ORAL_CAPSULE | Freq: Every day | RESPIRATORY_TRACT | Status: DC
  Administered 2017-08-07 – 2017-08-08 (×2): 18 ug via RESPIRATORY_TRACT
  Filled 2017-08-06: qty 5

## 2017-08-06 MED ORDER — SODIUM CHLORIDE 0.9 % IV SOLN
8.0000 mg/h | INTRAVENOUS | Status: DC
  Administered 2017-08-06 – 2017-08-08 (×5): 8 mg/h via INTRAVENOUS
  Filled 2017-08-06 (×6): qty 80

## 2017-08-06 MED ORDER — FUROSEMIDE 10 MG/ML IJ SOLN
60.0000 mg | Freq: Once | INTRAMUSCULAR | Status: AC
  Administered 2017-08-06: 60 mg via INTRAVENOUS
  Filled 2017-08-06: qty 8

## 2017-08-06 MED ORDER — SODIUM CHLORIDE 0.9 % IV SOLN
400.0000 mg | Freq: Once | INTRAVENOUS | Status: AC
  Administered 2017-08-07: 08:00:00 400 mg via INTRAVENOUS
  Filled 2017-08-06: qty 20

## 2017-08-06 MED ORDER — ROPINIROLE HCL 0.5 MG PO TABS
0.5000 mg | ORAL_TABLET | ORAL | Status: AC
  Administered 2017-08-06: 0.5 mg via ORAL
  Filled 2017-08-06: qty 1

## 2017-08-06 MED ORDER — POTASSIUM CHLORIDE CRYS ER 20 MEQ PO TBCR
40.0000 meq | EXTENDED_RELEASE_TABLET | Freq: Once | ORAL | Status: AC
  Administered 2017-08-06: 23:00:00 40 meq via ORAL
  Filled 2017-08-06: qty 2

## 2017-08-06 MED ORDER — ONDANSETRON HCL 4 MG PO TABS: 4.0000 mg | ORAL_TABLET | Freq: Four times a day (QID) | ORAL | Status: DC | PRN

## 2017-08-06 MED ORDER — ACETAMINOPHEN 325 MG PO TABS
650.0000 mg | ORAL_TABLET | Freq: Four times a day (QID) | ORAL | Status: DC | PRN
  Administered 2017-08-07 – 2017-08-08 (×3): 650 mg via ORAL
  Filled 2017-08-06 (×3): qty 2

## 2017-08-06 NOTE — ED Notes (Signed)
X-ray at bedside

## 2017-08-06 NOTE — ED Triage Notes (Signed)
Episodes of weakness, particularly when trying to stand up for 2 weeks, denies chest pain but has had SOB.

## 2017-08-06 NOTE — ED Provider Notes (Signed)
[facility] Emergency Department Provider Note  ____________________________________________  Time seen: Approximately 6:52 PM  I have reviewed the triage vital signs and the nursing notes.   HISTORY  Chief Complaint Weakness    HPI Hailey Farrell is a 67 y.o. female with a history of GI bleed and recent GI polyp removal presenting with presyncope.  The patient reports that 2 weeks ago she underwent polyp removal for history of GI bleed, and was told that the polyps were benign and that she was discharged from the GI clinic for 5 years until follow-up.  However, since her procedure, the patient has had exertional shortness of breath with postural lightheadedness.  Over the last few days, she has even felt like she might faint.  She has not noted any black or tarry stool, blood in her stool, she has not been having any abdominal pain, fevers or chills.  Past Medical History:  Diagnosis Date  . Breast cancer ([place])    Right  . COPD (chronic obstructive pulmonary disease) ([place])   . Elevated lipids   . Hypertension   . Lower extremity edema   . Migraines   . Migraines   . Restless leg syndrome   . Rotator cuff injury     Patient Active Problem List   Diagnosis Date Noted  . Acute peptic ulcer of stomach   . Common duodenal ulcer   . Personal history of colonic polyps   . Polyp of sigmoid colon   . Internal hemorrhoids   . Intestinal lump   . GIB (gastrointestinal bleeding) 04/11/2017  . S/P rotator cuff repair 03/01/2016    Past Surgical History:  Procedure Laterality Date  . ABDOMINAL HYSTERECTOMY    . BREAST BIOPSY Right   . COLONOSCOPY WITH PROPOFOL N/A 07/20/2017   Procedure: COLONOSCOPY WITH PROPOFOL;  Surgeon: Virgel Manifold, MD;  Location: [facility] ENDOSCOPY;  Service: Endoscopy;  Laterality: N/A;  . ESOPHAGOGASTRODUODENOSCOPY Left 04/13/2017   Procedure: ESOPHAGOGASTRODUODENOSCOPY (EGD);  Surgeon: Virgel Manifold, MD;  Location: [facility]  ENDOSCOPY;  Service: Endoscopy;  Laterality: Left;  . ESOPHAGOGASTRODUODENOSCOPY (EGD) WITH PROPOFOL N/A 07/20/2017   Procedure: ESOPHAGOGASTRODUODENOSCOPY (EGD) WITH PROPOFOL;  Surgeon: Virgel Manifold, MD;  Location: [facility] ENDOSCOPY;  Service: Endoscopy;  Laterality: N/A;  . JOINT REPLACEMENT     Lt knee  . MEDIAL PARTIAL KNEE REPLACEMENT    . PARTIAL HYSTERECTOMY    . SHOULDER ARTHROSCOPY WITH OPEN ROTATOR CUFF REPAIR Left 03/01/2016   Procedure: SHOULDER ARTHROSCOPY WITH OPEN ROTATOR CUFF REPAIR;  Surgeon: Thornton Park, MD;  Location: [facility] ORS;  Service: Orthopedics;  Laterality: Left;    Current Outpatient Rx  . Order #: [id number] Class: Normal  . Reflex Order#: [id number] (Ord#:[id number])Class: Normal  . Order #: [id number] Class: Historical Med  . Order #: [id number] Class: Print  . Order #: [id number] Class: Normal  . Order #: [id number] Class: Historical Med  . Order #: [id number] Class: Historical Med  . Order #: [id number] Class: Historical Med  . Order #: [id number] Class: Normal  . Order #: [id number] Class: Normal  . Order #: [id number] Class: Normal  . Order #: [id number] Class: Print  . Order #: [id number] Class: Historical Med  . Order #: [id number] Class: Historical Med  . Order #: [id number] Class: Historical Med  . Order #: [id number] Class: Historical Med  . Order #: [id number] Class: Historical Med  . Order #: [id number] Class: Historical Med    Allergies Latex  No family history on file.  Social History Social History   Tobacco Use  . Smoking status: Current Some Day  Smoker    Packs/day: 0.25    Years: 50.00    Pack years: 12.50    Types: Cigarettes    Last attempt to quit: 03/13/2016    Years since quitting: 1.4  . Smokeless tobacco: Never Used  Substance Use Topics  . Alcohol use: No  . Drug use: No    Review of Systems Constitutional: No fever/chills.  Positive lightheadedness with presyncope. Eyes: No visual changes. ENT: No sore throat. No congestion or  rhinorrhea. Cardiovascular: Denies chest pain. Denies palpitations. Respiratory: Positive exertional shortness of breath.  No cough. Gastrointestinal: No abdominal pain.  No nausea, no vomiting.  No diarrhea.  No constipation. Genitourinary: Negative for dysuria. Musculoskeletal: Negative for back pain. Skin: Negative for rash. Neurological: Negative for headaches. No focal numbness, tingling or weakness.     ____________________________________________   PHYSICAL EXAM:  VITAL SIGNS: ED Triage Vitals  Enc Vitals Group     BP 08/06/17 1758 (!) 113/59     Pulse Rate 08/06/17 1758 (!) 103     Resp 08/06/17 1758 20     Temp 08/06/17 1758 99.5 F (37.5 C)     Temp Source 08/06/17 1758 Oral     SpO2 08/06/17 1758 100 %     Weight 08/06/17 1759 170 lb (77.1 kg)     Height 08/06/17 1759 5\' 3"  (1.6 m)     Head Circumference --      Peak Flow --      Pain Score --      Pain Loc --      Pain Edu? --      Excl. in [place]? --     Constitutional: Alert and oriented. Well appearing and in no acute distress. Answers questions appropriately. Eyes: Conjunctivae with pallor.  EOMI. No scleral icterus. Head: Atraumatic. Nose: No congestion/rhinnorhea. Mouth/Throat: Mucous membranes are moist.  Neck: No stridor.  Supple.  No JVD.  No meningismus. Cardiovascular: Normal rate, regular rhythm. No murmurs, rubs or gallops.  Respiratory: Normal respiratory effort.  No accessory muscle use or retractions. Lungs CTAB.  No wheezes, rales or ronchi. Gastrointestinal: Obese.  Soft, nontender and nondistended.  No guarding or rebound.  No peritoneal signs. Genitourinary: Several nonbleeding, nonthrombosed hemorrhoids.  No pain with rectal examination.  The stool is black and brown and guaiac positive. Musculoskeletal: No LE edema. Neurologic:  A&Ox3.  Speech is clear.  Face and smile are symmetric.  EOMI.  Moves all extremities well. Skin:  Skin is warm, dry and intact. No rash noted. Psychiatric: Mood  and affect are normal. Speech and behavior are normal.  Normal judgement  ____________________________________________   LABS (all labs ordered are listed, but only abnormal results are displayed)  Labs Reviewed  CBC - Abnormal; Notable for the following components:      Result Value   RBC 2.08 (*)    Hemoglobin 3.7 (*)    HCT 12.9 (*)    MCV 61.9 (*)    MCH 17.7 (*)    MCHC 28.6 (*)    RDW 24.6 (*)    Platelets 456 (*)    All other components within normal limits  COMPREHENSIVE METABOLIC PANEL - Abnormal; Notable for the following components:   Potassium 3.3 (*)    Glucose, Bld 103 (*)    Calcium 8.7 (*)    Total Protein 6.0 (*)    ALT 8 (*)    Alkaline Phosphatase 32 (*)    All other components within normal limits  URINALYSIS, COMPLETE (  UACMP) WITH MICROSCOPIC - Abnormal; Notable for the following components:   Color, Urine YELLOW (*)    APPearance HAZY (*)    Leukocytes, UA MODERATE (*)    Squamous Epithelial / LPF 0-5 (*)    All other components within normal limits  TROPONIN I  APTT  PROTIME-INR  TYPE AND SCREEN  PREPARE RBC (CROSSMATCH)   ____________________________________________  EKG  ED ECG REPORT I, Eula Listen, the attending physician, personally viewed and interpreted this ECG.   Date: 08/06/2017  EKG Time: 1805  Rate: 106  Rhythm: sinus tachycardia  Axis: normal  Intervals:none  ST&T Change: No STEMI  ____________________________________________  RADIOLOGY  No results found.  ____________________________________________   PROCEDURES  Procedure(s) performed: None  Procedures  Critical Care performed: Yes, see critical care note(s) ____________________________________________   INITIAL IMPRESSION / ASSESSMENT AND PLAN / ED COURSE  Pertinent labs & imaging results that were available during my care of the patient were reviewed by me and considered in my medical decision making (see chart for details).  67 y.o.  female, with recent polyp removal under colonoscopy procedure, presenting with presyncope.  Overall, the patient has a concerning history and also physical examination was sinus tachycardia and worsening symptoms with standing.  She is showing evidence of GI bleed with melena.  It is likely that she may be bleeding from her polyp removal sites, but given that we have melena not bright red blood, we will start her on a Protonix bolus and drip. the patient's laboratory studies are concerning for a hemoglobin of 3.7, and I have ordered 1 unit of emergency release blood and 2 units of typed specific blood for this patient.  I have laced to call to the GI physician so that they are aware.  At this time, the patient will be admitted to the hospitalist for further evaluation and treatment.   CRITICAL CARE Performed by: Eula Listen   Total critical care time: 35 minutes  Critical care time was exclusive of separately billable procedures and treating other patients.  Critical care was necessary to treat or prevent imminent or life-threatening deterioration.  Critical care was time spent personally by me on the following activities: development of treatment plan with patient and/or surrogate as well as nursing, discussions with consultants, evaluation of patient's response to treatment, examination of patient, obtaining history from patient or surrogate, ordering and performing treatments and interventions, ordering and review of laboratory studies, ordering and review of radiographic studies, pulse oximetry and re-evaluation of patient's condition.   ____________________________________________  FINAL CLINICAL IMPRESSION(S) / ED DIAGNOSES  Final diagnoses:  Symptomatic anemia  Gastrointestinal hemorrhage, unspecified gastrointestinal hemorrhage type  Postural dizziness with presyncope         NEW MEDICATIONS STARTED DURING THIS VISIT:  New Prescriptions   No medications on file       Eula Listen, MD 08/06/17 1902

## 2017-08-06 NOTE — ED Notes (Signed)
Verified blood with Shanon Brow, RN

## 2017-08-06 NOTE — ED Notes (Signed)
Pt states she had colonoscopy about 2 weeks ago and found polyps. States was having GI bleeding prior. For past 2 weeks pt states SOB with movement, feeling weak, dizzy/feel like she will pass out when she stands up. Pt denies vomiting blood or seeing blood in stool. Pt was syncopal in wheelchair. Once laid down pt became alert, oriented, starting joking. Tearful at IV start. No diaphoresis noted.    Fecal occult positive.

## 2017-08-06 NOTE — ED Notes (Signed)
Second unit of blood finished.

## 2017-08-06 NOTE — H&P (Signed)
[facility] [place] NAME: Hailey Farrell    MR#:  [id number]  DATE OF BIRTH:  12/26/1950  DATE OF ADMISSION:  08/06/2017  PRIMARY CARE PHYSICIAN: Letta Median, MD   REQUESTING/REFERRING PHYSICIAN: Dr Aundria Rud  CHIEF COMPLAINT:   Chief Complaint  Patient presents with  . Weakness    HISTORY OF PRESENT ILLNESS:  Hailey Farrell  is a 67 y.o. female with a known history of presenting with weakness.  She states the last few days she has had a dark bowel movements.  Over the last 2-3 weeks when she gets up she almost passes out.  She has been having a headache.  She passed out here will changing chairs.  On 07/20/2017 she had an upper endoscopy and colonoscopy that showed 2 duodenal ulcers, a few polyps and some hemorrhoids.  In the ER, her hemoglobin was found to be 3.7 and hospitalist services were contacted for further evaluation.  PAST MEDICAL HISTORY:   Past Medical History:  Diagnosis Date  . Breast cancer ([place])    Right  . COPD (chronic obstructive pulmonary disease) ([place])   . Elevated lipids   . Hypertension   . Lower extremity edema   . Migraines   . Migraines   . Restless leg syndrome   . Rotator cuff injury     PAST SURGICAL HISTORY:   Past Surgical History:  Procedure Laterality Date  . ABDOMINAL HYSTERECTOMY    . BREAST BIOPSY Right   . COLONOSCOPY WITH PROPOFOL N/A 07/20/2017   Procedure: COLONOSCOPY WITH PROPOFOL;  Surgeon: Virgel Manifold, MD;  Location: [facility] ENDOSCOPY;  Service: Endoscopy;  Laterality: N/A;  . ESOPHAGOGASTRODUODENOSCOPY Left 04/13/2017   Procedure: ESOPHAGOGASTRODUODENOSCOPY (EGD);  Surgeon: Virgel Manifold, MD;  Location: [facility] ENDOSCOPY;  Service: Endoscopy;  Laterality: Left;  . ESOPHAGOGASTRODUODENOSCOPY (EGD) WITH PROPOFOL N/A 07/20/2017   Procedure: ESOPHAGOGASTRODUODENOSCOPY (EGD) WITH PROPOFOL;  Surgeon: Virgel Manifold, MD;  Location: [facility] ENDOSCOPY;  Service:  Endoscopy;  Laterality: N/A;  . JOINT REPLACEMENT     Lt knee  . MEDIAL PARTIAL KNEE REPLACEMENT    . PARTIAL HYSTERECTOMY    . SHOULDER ARTHROSCOPY WITH OPEN ROTATOR CUFF REPAIR Left 03/01/2016   Procedure: SHOULDER ARTHROSCOPY WITH OPEN ROTATOR CUFF REPAIR;  Surgeon: Thornton Park, MD;  Location: [facility] ORS;  Service: Orthopedics;  Laterality: Left;    SOCIAL HISTORY:   Social History   Tobacco Use  . Smoking status: Current Some Day Smoker    Packs/day: 0.10    Years: 50.00    Pack years: 5.00    Types: Cigarettes    Last attempt to quit: 03/13/2016    Years since quitting: 1.4  . Smokeless tobacco: Never Used  Substance Use Topics  . Alcohol use: No    FAMILY HISTORY:   Family History  Problem Relation Age of Onset  . CAD Mother   . CAD Sister     DRUG ALLERGIES:   Allergies  Allergen Reactions  . Latex Rash    REVIEW OF SYSTEMS:  CONSTITUTIONAL: No fever, positive for cold feeling.  Positive for fatigue.  Positive for weight loss trying. EYES: No blurred or double vision.  EARS, NOSE, AND THROAT: No tinnitus or ear pain. No sore throat RESPIRATORY: No cough, positive for shortness of breath, no wheezing or hemoptysis.  CARDIOVASCULAR: No chest pain, orthopnea, edema.  GASTROINTESTINAL: No nausea, vomiting, diarrhea or abdominal pain. No blood in bowel movements GENITOURINARY: No dysuria, hematuria.  ENDOCRINE: No polyuria, nocturia,  HEMATOLOGY: Positive for anemia, no easy bruising or bleeding SKIN: No rash or lesion. MUSCULOSKELETAL: No joint pain or arthritis.  Positive for restless leg syndrome NEUROLOGIC: No tingling, numbness, weakness.  Positive for syncope PSYCHIATRY: Positive for anxiety.   MEDICATIONS AT HOME:   Prior to Admission medications   Medication Sig Start Date End Date Taking? Authorizing Provider  budesonide-formoterol (SYMBICORT) 160-4.5 MCG/ACT inhaler Inhale 2 puffs daily into the lungs.    Yes [provider]   doxycycline (VIBRA-TABS) 100 MG tablet Take 1 tablet (100 mg total) by mouth 2 (two) times daily. 08/03/17  Yes Vonda Antigua B, MD  furosemide (LASIX) 20 MG tablet Take 20 mg daily by mouth.    Yes [provider]  lisinopril (PRINIVIL,ZESTRIL) 20 MG tablet Take 20 mg by mouth daily.   Yes [provider]  metoprolol succinate (TOPROL-XL) 50 MG 24 hr tablet Take 50 mg by mouth daily. Take with or immediately following a meal.   Yes [provider]  metroNIDAZOLE (FLAGYL) 250 MG tablet Take 1 tablet (250 mg total) by mouth 4 (four) times daily. 08/02/17  Yes Tahiliani, Margretta Sidle B, MD  potassium chloride (MICRO-K) 10 MEQ CR capsule Take 10 mEq by mouth daily.   Yes [provider]  rOPINIRole (REQUIP) 0.5 MG tablet Take 0.5 mg by mouth at bedtime.   Yes [provider]  simvastatin (ZOCOR) 20 MG tablet Take 20 mg by mouth daily at 6 PM.   Yes [provider]  tiotropium (SPIRIVA) 18 MCG inhalation capsule Place 18 mcg into inhaler and inhale daily.   Yes [provider]  SUMAtriptan (IMITREX) 6 MG/0.5ML SOLN injection Inject 6 mg every 2 (two) hours as needed into the skin for migraine or headache. May repeat in 2 hours if headache persists or recurs.    [provider]      VITAL SIGNS:  Blood pressure (!) 145/78, pulse (!) 101, temperature 98.1 F (36.7 C), temperature source Oral, resp. rate (!) 22, height 5\' 3"  (1.6 m), weight 77.1 kg (170 lb), SpO2 97 %.  PHYSICAL EXAMINATION:  GENERAL:  67 y.o.-year-old patient lying in the bed with no acute distress.  EYES: Pupils equal, round, reactive to light and accommodation. No scleral icterus. Extraocular muscles intact.  Conjunctiva pale HEENT: Head atraumatic, normocephalic. Oropharynx and nasopharynx clear.  NECK:  Supple, no jugular venous distention. No thyroid enlargement, no tenderness.  LUNGS: Normal breath sounds bilaterally, no wheezing, rales,rhonchi or  crepitation. No use of accessory muscles of respiration.  CARDIOVASCULAR: S1, S2 tachycardic. No murmurs, rubs, or gallops.  ABDOMEN: Soft, nontender, nondistended. Bowel sounds present. No organomegaly or mass.  EXTREMITIES: No pedal edema, cyanosis, or clubbing.  NEUROLOGIC: Cranial nerves II through XII are intact. Muscle strength 5/5 in all extremities. Sensation intact. Gait not checked.  PSYCHIATRIC: The patient is alert and oriented x 3.  SKIN: No rash, lesion, or ulcer.   LABORATORY PANEL:   CBC Recent Labs  Lab 08/06/17 1805  WBC 4.9  HGB 3.7*  HCT 12.9*  PLT 456*   ------------------------------------------------------------------------------------------------------------------  Chemistries  Recent Labs  Lab 08/06/17 1805  NA 140  K 3.3*  CL 107  CO2 23  GLUCOSE 103*  BUN 15  CREATININE 0.75  CALCIUM 8.7*  AST 17  ALT 8*  ALKPHOS 32*  BILITOT 0.5   ------------------------------------------------------------------------------------------------------------------  Cardiac Enzymes Recent Labs  Lab 08/06/17 1805  TROPONINI <0.03   ------------------------------------------------------------------------------------------------------------------  RADIOLOGY:  Chest  x-ray ordered by me  EKG:   Sinus tachycardia.  No acute ST-T wave changes  IMPRESSION AND PLAN:   1.  Acute hemorrhagic anemia.  ER physician ordered 3 units of packed red blood cells.  Add on a ferritin.  Order a B12 for tomorrow.  Serial hemoglobins.  ER physician spoke with Dr. Vicente Males gastroenterology to do procedures tomorrow.  Clear liquid diet for now n.p.o. after midnight.  IV iron for tomorrow morning 2.  2 duodenal ulcers seen last endoscopy.  Patient placed on Protonix drip. 3.  COPD.  Respiratory status stable 4.  Restless leg syndrome give Requip now since her legs are trembling 5.  Essential hypertension.  Hold lisinopril and give Toprol tomorrow morning 6.  History of breast  cancer status post lumpectomy and radiation therapy in the past 7.  History of migraines 8.  Tobacco abuse smoking cessation counseling done 4 minutes by me.  Only smokes 1 cigarette a day so does not need nicotine patch 9.  Hypokalemia replace potassium orally  I have reviewed the laboratory data and EKG Management plans discussed with the patient, family and they are in agreement.  CODE STATUS: Full code  TOTAL TIME TAKING CARE OF THIS PATIENT: 50 minutes.    Loletha Grayer M.D on 08/06/2017 at 8:32 PM  Between 7am to 6pm - Pager - [phone]  After 6pm call admission pager [phone]  [facility] Office  [phone]  CC: Primary care physician; Letta Median, MD

## 2017-08-06 NOTE — ED Notes (Signed)
First unit completed 

## 2017-08-06 NOTE — ED Notes (Signed)
1st unit of blood ([id number]) verified with Raquel RN

## 2017-08-07 ENCOUNTER — Other Ambulatory Visit: Payer: Self-pay

## 2017-08-07 DIAGNOSIS — D500 Iron deficiency anemia secondary to blood loss (chronic): Secondary | ICD-10-CM

## 2017-08-07 LAB — BASIC METABOLIC PANEL
Anion gap: 8 (ref 5–15)
BUN: 13 mg/dL (ref 6–20)
CALCIUM: 8.5 mg/dL — AB (ref 8.9–10.3)
CHLORIDE: 107 mmol/L (ref 101–111)
CO2: 25 mmol/L (ref 22–32)
Creatinine, Ser: 0.78 mg/dL (ref 0.44–1.00)
GFR calc non Af Amer: >60 mL/min (ref >60)
Glucose, Bld: 86 mg/dL (ref 65–99)
Potassium: 3.4 mmol/L — ABNORMAL LOW (ref 3.5–5.1)
Sodium: 140 mmol/L (ref 135–145)

## 2017-08-07 LAB — CBC
HCT: 22.1 % — ABNORMAL LOW (ref 35.0–47.0)
Hemoglobin: 7.1 g/dL — ABNORMAL LOW (ref 12.0–16.0)
MCH: 22.1 pg — AB (ref 26.0–34.0)
MCHC: 32 g/dL (ref 32.0–36.0)
MCV: 69.1 fL — AB (ref 80.0–100.0)
PLATELETS: 384 10*3/uL (ref 150–440)
RBC: 3.2 MIL/uL — ABNORMAL LOW (ref 3.80–5.20)
RDW: 25.7 % — AB (ref 11.5–14.5)
WBC: 6.3 10*3/uL (ref 3.6–11.0)

## 2017-08-07 LAB — VITAMIN B12: VITAMIN B 12: 189 pg/mL (ref 180–914)

## 2017-08-07 LAB — FOLATE: Folate: 8.3 ng/mL (ref >5.9)

## 2017-08-07 MED ORDER — SODIUM CHLORIDE 0.9 % IV SOLN
510.0000 mg | Freq: Once | INTRAVENOUS | Status: AC
  Administered 2017-08-08: 510 mg via INTRAVENOUS
  Filled 2017-08-07: qty 17

## 2017-08-07 MED ORDER — CYANOCOBALAMIN 1000 MCG/ML IJ SOLN
1000.0000 ug | Freq: Once | INTRAMUSCULAR | Status: AC
  Administered 2017-08-07: 21:00:00 1000 ug via INTRAMUSCULAR
  Filled 2017-08-07: qty 1

## 2017-08-07 MED ORDER — ENSURE ENLIVE PO LIQD
237.0000 mL | Freq: Two times a day (BID) | ORAL | Status: DC
  Administered 2017-08-07: 237 mL via ORAL

## 2017-08-07 NOTE — Progress Notes (Signed)
Pt still waiting for GI consult, very upset that she has been here since yesterday without eating or seeing doctor.  Page out to Dr. Marius Ditch, ordered full liquid diet for pt and will be over to see.  Updated pt, she is very unhappy, declined offer for something to drink at this time.  Will follow

## 2017-08-07 NOTE — Progress Notes (Signed)
[facility] NAME: Hailey Farrell    MR#:  [id number]  DATE OF BIRTH:  Jul 11, 1950  SUBJECTIVE: Patient admitted for severe anemia, found to have hemoglobin 3.7.  She also is feeling really weak and fatigued.noticed to have melena.  EGD done 2 weeks ago showed nonbleeding superficial duodenal ulcers without any evidence of bleeding.  Patient is here again for anemia, GI bleed.  Received 3 units of packed RBC, hemoglobin improved from 3.7-7.1.  I spoke with Dr. Marius Ditch will see the patient  at this time patient is on Protonix drip.  CHIEF COMPLAINT:   Chief Complaint  Patient presents with  . Weakness    REVIEW OF SYSTEMS:   ROS CONSTITUTIONAL: No fever, fatigue or weakness.  EYES: No blurred or double vision.  EARS, NOSE, AND THROAT: No tinnitus or ear pain.  RESPIRATORY: No cough, shortness of breath, wheezing or hemoptysis.  CARDIOVASCULAR: No chest pain, orthopnea, edema.  GASTROINTESTINAL: No nausea, vomiting, diarrhea or abdominal pain.  GENITOURINARY: No dysuria, hematuria.  ENDOCRINE: No polyuria, nocturia,  HEMATOLOGY: No anemia, easy bruising or bleeding SKIN: No rash or lesion. MUSCULOSKELETAL: No joint pain or arthritis.   NEUROLOGIC: No tingling, numbness, weakness.  PSYCHIATRY: No anxiety or depression.   DRUG ALLERGIES:   Allergies  Allergen Reactions  . Latex Rash    VITALS:  Blood pressure (!) 120/55, pulse 88, temperature 98 F (36.7 C), temperature source Oral, resp. rate 15, height 5\' 3"  (1.6 m), weight 82 kg (180 lb 12.8 oz), SpO2 100 %.  PHYSICAL EXAMINATION:  GENERAL:  67 y.o.-year-old patient lying in the bed with no acute distress.  EYES: Pupils equal, round, reactive to light and accommodation. No scleral icterus. Extraocular muscles intact.  HEENT: Head atraumatic, normocephalic. Oropharynx and nasopharynx clear.  NECK:  Supple, no jugular venous distention. No thyroid enlargement, no  tenderness.  LUNGS: Normal breath sounds bilaterally, no wheezing, rales,rhonchi or crepitation. No use of accessory muscles of respiration.  CARDIOVASCULAR: S1, S2 normal. No murmurs, rubs, or gallops.  ABDOMEN: Soft, nontender, nondistended. Bowel sounds present. No organomegaly or mass.  EXTREMITIES: No pedal edema, cyanosis, or clubbing.  NEUROLOGIC: Cranial nerves II through XII are intact. Muscle strength 5/5 in all extremities. Sensation intact. Gait not checked.  PSYCHIATRIC: The patient is alert and oriented x 3.  SKIN: No obvious rash, lesion, or ulcer.    LABORATORY PANEL:   CBC Recent Labs  Lab 08/07/17 0411  WBC 6.3  HGB 7.1*  HCT 22.1*  PLT 384   ------------------------------------------------------------------------------------------------------------------  Chemistries  Recent Labs  Lab 08/06/17 1805 08/07/17 0411  NA 140 140  K 3.3* 3.4*  CL 107 107  CO2 23 25  GLUCOSE 103* 86  BUN 15 13  CREATININE 0.75 0.78  CALCIUM 8.7* 8.5*  AST 17  --   ALT 8*  --   ALKPHOS 32*  --   BILITOT 0.5  --    ------------------------------------------------------------------------------------------------------------------  Cardiac Enzymes Recent Labs  Lab 08/06/17 1805  TROPONINI <0.03   ------------------------------------------------------------------------------------------------------------------  RADIOLOGY:  Dg Chest Port 1 View  Result Date: 08/06/2017 CLINICAL DATA:  Weakness and shortness of breath over the last 2 or 3 weeks. EXAM: PORTABLE CHEST 1 VIEW COMPARISON:  None. FINDINGS: 2036 hours. The heart is enlarged. There is mild aortic tortuosity. The lungs are clear. There is no pleural effusion or pneumothorax. No acute osseous findings are seen. There are postsurgical changes in the cervical spine and  left shoulder. Telemetry leads overlie the chest. IMPRESSION: Mild cardiomegaly.  No active cardiopulmonary process identified. Electronically Signed    By: Richardean Sale M.D.   On: 08/06/2017 20:53    EKG:   Orders placed or performed during the hospital encounter of 08/06/17  . EKG 12-Lead  . EKG 12-Lead  . ED EKG  . ED EKG    ASSESSMENT AND PLAN:   1/acute hemorrhagic anemia: Hemoglobin was 3.7 when she came, now 7.1 after 2 units of packed RBC transfusion.  Continue n.p.o. 2.  Likely related to underlying duodenal ulcers; patient on Protonix drip.  Advised the patient to avoid NSAID-containing products even after discharge.  Patient told me that she has no idea that she had duodenal ulcers when they did EGD last time. 3.  History of COPD: No wheezing, continue to monitor clinically.  Continue Dulera, Spiriva. 4.  RLS: Continue Requip. 5.  Mild hypokalemia: Continue to replace. 6.  Essential hypertension: Controlled.  Continue metoprolol. 7.  Iron deficiency anemia, patient is on Venofer.  Discussed with patient, patient's family, I also called Dr. Marius Ditch who will see the patient. All the records are reviewed and case discussed with Care Management/Social Workerr. Management plans discussed with the patient, family and they are in agreement.  CODE STATUS: full  TOTAL TIME TAKING CARE OF THIS PATIENT: 35 minutes.   POSSIBLE D/C IN 1-2 DAYS, DEPENDING ON CLINICAL CONDITION.   Epifanio Lesches M.D on 08/07/2017 at 11:46 AM  Between 7am to 6pm - Pager - [phone]  After 6pm go to [email or web address] - password [email or web address] [facility]  Office  [phone]  CC: Primary care physician; Letta Median, MD   Note: This dictation was prepared with Dragon dictation along with smaller phrase technology. Any transcriptional errors that result from this process are unintentional.

## 2017-08-07 NOTE — Consult Note (Addendum)
Hailey Darby, MD [address]  [place]  [place], [place] [address]  Main: [phone]  Fax: [phone] Pager: [phone]   Consultation  Referring Provider:     No ref. provider found Primary Care Physician:  Hailey Median, MD Primary Gastroenterologist:  Dr. Bonna Farrell         Reason for Consultation:     Severe symptomatic anemia  Date of Admission:  08/06/2017 Date of Consultation:  08/07/2017         HPI:   Hailey Farrell is a 67 y.o. African-American female with history of peptic ulcer disease etiology thought to be secondary to H. pylori and NSAID use, diagnosed in 04/2017 when she was admitted to the hospital with severe iron deficiency anemia. She was subsequently seen in clinic by Dr Hailey Farrell in 05/2017 and planned to perform repeat EGD to confirm healing of the ulcers and confirm eradication of H. pylori as well as colonoscopy for surveillance of colon polyps. Patient underwent these procedures on 07/20/2017. EGD revealed healing of peptic ulcers, persistent H. pylori. Colonoscopy revealed small polyps that were removed.   Patient presented yesterday with severe weakness, presyncopial symptoms, hemoglobin was found to be 3.7. She received 3 units of PRBCs and responded appropriately. Hemoglobin today 7.1. She has severe microcytosis, MCV 61.9. Her hemoglobin during last admission was 8. Hemoglobin was not rechecked since then as outpatient. She had normal hemoglobin 13.5 with normal MCV in 01/2016. She had severe iron deficiency, ferritin of 6 in 04/2017. Ferritin levels during this admission are 3. Apparently, patient reports that she has not been on oral iron replacement therapy since 04/2017. She reports not receiving any parenteral iron during that hospital admission as well. After most recent endoscopy procedures, her husband brought her Hailey Farrell which has been taking intermittently. She has been taking Protonix once daily as outpatient. She noticed that her  stools are dark but formed for last 3 days. She denies black tarry stools. She otherwise denies abdominal pain, nausea, vomiting, rectal bleeding. She started on Protonix drip and GI consulted for further management  NSAIDs: Used to take [employer] powder and meloxicam prior to initial hospital admission in 04/2017  Antiplts/Anticoagulants/Anti thrombotics: None  GI Procedures:  Colonoscopy 07/14/2013 Diagnosis:  Part A: ASCENDING COLON POLYP HOT SNARED:  - TUBULAR ADENOMA.  - NEGATIVE FOR HIGH GRADE DYSPLASIA AND MALIGNANCY.  .  Part B: RECTAL POLYP HOT SNARED:  - TUBULAR ADENOMA.  - NEGATIVE FOR HIGH GRADE DYSPLASIA AND MALIGNANCY.  EGD 04/13/2017 - Normal esophagus. - Non-bleeding gastric ulcers, 60m in diameter with no stigmata of bleeding. Biopsied. - One non-bleeding duodenal ulcer with no stigmata of bleeding. - Erythematous duodenopathy. Pathology DIAGNOSIS:  A. STOMACH; COLD BIOPSY:  - MODERATE CHRONIC ACTIVE HELICOBACTER ASSOCIATED GASTRITIS.  - NEGATIVE FOR DYSPLASIA AND MALIGNANCY.   Colonoscopy 07/20/2017 - One 5 mm polyp in the sigmoid colon, removed with a cold snare. Resected and retrieved. - One 2 mm polyp in the sigmoid colon, removed with a cold biopsy forceps. Resected and retrieved. - Thickened folds of the mucosa in the cecum. Biopsied. - Internal hemorrhoids. - The examination was otherwise normal.  C. COLON, CECAL LESION; COLD BIOPSY:  - FOCAL MINIMAL ACTIVE COLITIS, SEE COMMENT.  - SUBMUCOSAL TISSUE TO ASSESS FOR A LIPOMA IS NOT PRESENT FOR  EVALUATION.  - NEGATIVE FOR DYSPLASIA AND MALIGNANCY.   D. COLON POLYP 2, SIGMOID; COLD SNARE:  - TUBULAR ADENOMA (2).  - HYPERPLASTIC POLYP (1).  -  NEGATIVE FOR HIGH GRADE DYSPLASIA AND MALIGNANCY.   EGD to 15 2019 - Normal esophagus. - Normal stomach. Previously seen gastric ulcers are not present. - One to two non-bleeding duodenal ulcer with no stigmata of bleeding. Biopsied. - Biopsies were obtained in the  gastric body, at the incisura and in the gastric antrum. These were done to assess for eradication of H. Pylori DIAGNOSIS:  A. DUODENAL BULB ULCERS; COLD BIOPSY:  - PEPTIC DUODENITIS.  - IHC STAIN FOR H. PYLORI IS NEGATIVE, SEE COMMENT.  - NEGATIVE FOR DYSPLASIA AND MALIGNANCY.   B. STOMACH, ANTRUM, BODY, INCISURA; COLD BIOPSY:  - ANTRAL AND OXYNTIC MUCOSA WITH MILD AND FOCALLY MODERATE CHRONIC  ACTIVE HELICOBACTER ASSOCIATED GASTRITIS, SEE COMMENT.  - ANTRAL MUCOSA WITH FOCAL INTESTINAL METAPLASIA.  - OXYNTIC MUCOSA WITH CHANGES CYSTIC WITH PROTON PUMP INHIBITOR USE..  - NEGATIVE FOR DYSPLASIA AND MALIGNANCY.    Past Medical History:  Diagnosis Date  . Breast cancer ([facility])    Right  . COPD (chronic obstructive pulmonary disease) ([facility])   . Elevated lipids   . Hypertension   . Lower extremity edema   . Migraines   . Migraines   . Restless leg syndrome   . Rotator cuff injury     Past Surgical History:  Procedure Laterality Date  . ABDOMINAL HYSTERECTOMY    . BREAST BIOPSY Right   . COLONOSCOPY WITH PROPOFOL N/A 07/20/2017   Procedure: COLONOSCOPY WITH PROPOFOL;  Surgeon: Virgel Manifold, MD;  Location: [facility] ENDOSCOPY;  Service: Endoscopy;  Laterality: N/A;  . ESOPHAGOGASTRODUODENOSCOPY Left 04/13/2017   Procedure: ESOPHAGOGASTRODUODENOSCOPY (EGD);  Surgeon: Virgel Manifold, MD;  Location: [facility] ENDOSCOPY;  Service: Endoscopy;  Laterality: Left;  . ESOPHAGOGASTRODUODENOSCOPY (EGD) WITH PROPOFOL N/A 07/20/2017   Procedure: ESOPHAGOGASTRODUODENOSCOPY (EGD) WITH PROPOFOL;  Surgeon: Virgel Manifold, MD;  Location: [facility] ENDOSCOPY;  Service: Endoscopy;  Laterality: N/A;  . JOINT REPLACEMENT     Lt knee  . MEDIAL PARTIAL KNEE REPLACEMENT    . PARTIAL HYSTERECTOMY    . SHOULDER ARTHROSCOPY WITH OPEN ROTATOR CUFF REPAIR Left 03/01/2016   Procedure: SHOULDER ARTHROSCOPY WITH OPEN ROTATOR CUFF REPAIR;  Surgeon: Thornton Park, MD;  Location: [facility] ORS;  Service: Orthopedics;   Laterality: Left;    Prior to Admission medications   Medication Sig Start Date End Date Taking? Authorizing Provider  budesonide-formoterol (SYMBICORT) 160-4.5 MCG/ACT inhaler Inhale 2 puffs daily into the lungs.    Yes [provider]  doxycycline (VIBRA-TABS) 100 MG tablet Take 1 tablet (100 mg total) by mouth 2 (two) times daily. 08/03/17  Yes Vonda Antigua B, MD  furosemide (LASIX) 20 MG tablet Take 20 mg daily by mouth.    Yes [provider]  lisinopril (PRINIVIL,ZESTRIL) 20 MG tablet Take 20 mg by mouth daily.   Yes [provider]  metoprolol succinate (TOPROL-XL) 50 MG 24 hr tablet Take 50 mg by mouth daily. Take with or immediately following a meal.   Yes [provider]  metroNIDAZOLE (FLAGYL) 250 MG tablet Take 1 tablet (250 mg total) by mouth 4 (four) times daily. 08/02/17  Yes Tahiliani, Margretta Sidle B, MD  potassium chloride (MICRO-K) 10 MEQ CR capsule Take 10 mEq by mouth daily.   Yes [provider]  rOPINIRole (REQUIP) 0.5 MG tablet Take 0.5 mg by mouth at bedtime.   Yes [provider]  simvastatin (ZOCOR) 20 MG tablet Take 20 mg by mouth daily at 6 PM.   Yes [provider]  tiotropium (SPIRIVA) 18 MCG  inhalation capsule Place 18 mcg into inhaler and inhale daily.   Yes [provider]  SUMAtriptan (IMITREX) 6 MG/0.5ML SOLN injection Inject 6 mg every 2 (two) hours as needed into the skin for migraine or headache. May repeat in 2 hours if headache persists or recurs.    [provider]    Family History  Problem Relation Age of Onset  . CAD Mother   . CAD Sister      Social History   Tobacco Use  . Smoking status: Current Some Day Smoker    Packs/day: 0.10    Years: 50.00    Pack years: 5.00    Types: Cigarettes    Last attempt to quit: 03/13/2016    Years since quitting: 1.4  . Smokeless tobacco: Never Used  Substance Use Topics  . Alcohol use: No  . Drug use: No    Allergies as  of 08/06/2017 - Review Complete 08/06/2017  Allergen Reaction Noted  . Latex Rash 02/03/2015    Review of Systems:    All systems reviewed and negative except where noted in HPI.   Physical Exam:  Vital signs in last 24 hours: Temp:  [97.9 F (36.6 C)-98.7 F (37.1 C)] 98.3 F (36.8 C) (03/05 1248) Pulse Rate:  [77-102] 77 (03/05 1248) Resp:  [14-25] 14 (03/05 1248) BP: (115-152)/(55-86) 125/61 (03/05 1248) SpO2:  [96 %-100 %] 97 % (03/05 1248) Weight:  [180 lb 12.8 oz (82 kg)] 180 lb 12.8 oz (82 kg) (03/04 2242) Last BM Date: 08/06/17 General:   Pleasant, cooperative in NAD Head:  Normocephalic and atraumatic. Eyes:   No icterus.   Conjunctiva pink. PERRLA. Ears:  Normal auditory acuity. Neck:  Supple; no masses or thyroidomegaly Lungs: Respirations even and unlabored. Lungs clear to auscultation bilaterally.   No wheezes, crackles, or rhonchi.  Heart:  Regular rate and rhythm;  Without murmur, clicks, rubs or gallops Abdomen:  Soft, nondistended, nontender. Normal bowel sounds. No appreciable masses or hepatomegaly.  No rebound or guarding.  Rectal:  Not performed. Msk:  Symmetrical without gross deformities.  Strength normal  Extremities:  Without edema, cyanosis or clubbing. Neurologic:  Alert and oriented x3;  grossly normal neurologically. Skin:  Intact without significant lesions or rashes. Psych:  Alert and cooperative. Normal affect.  LAB RESULTS: CBC Latest Ref Rng & Units 08/07/2017 08/06/2017 04/13/2017  WBC 3.6 - 11.0 K/uL 6.3 4.9 -  Hemoglobin 12.0 - 16.0 g/dL 7.1(L) 3.7(LL) 8.0(L)  Hematocrit 35.0 - 47.0 % 22.1(L) 12.9(LL) -  Platelets 150 - 440 K/uL 384 456(H) -    BMET BMP Latest Ref Rng & Units 08/07/2017 08/06/2017 04/12/2017  Glucose 65 - 99 mg/dL 86 103(H) 103(H)  BUN 6 - 20 mg/dL '13 15 16  '$ Creatinine 0.44 - 1.00 mg/dL 0.78 0.75 0.93  Sodium 135 - 145 mmol/L 140 140 141  Potassium 3.5 - 5.1 mmol/L 3.4(L) 3.3(L) 3.6  Chloride 101 - 111 mmol/L 107 107 112(H)   CO2 22 - 32 mmol/L '25 23 24  '$ Calcium 8.9 - 10.3 mg/dL 8.5(L) 8.7(L) 8.4(L)    LFT Hepatic Function Latest Ref Rng & Units 08/06/2017 02/03/2015  Total Protein 6.5 - 8.1 g/dL 6.0(L) 6.0(L)  Albumin 3.5 - 5.0 g/dL 3.5 3.6  AST 15 - 41 U/L 17 20  ALT 14 - 54 U/L 8(L) 13(L)  Alk Phosphatase 38 - 126 U/L 32(L) 33(L)  Total Bilirubin 0.3 - 1.2 mg/dL 0.5 0.2(L)     STUDIES: Dg Chest Port 1  View  Result Date: 08/06/2017 CLINICAL DATA:  Weakness and shortness of breath over the last 2 or 3 weeks. EXAM: PORTABLE CHEST 1 VIEW COMPARISON:  None. FINDINGS: 2036 hours. The heart is enlarged. There is mild aortic tortuosity. The lungs are clear. There is no pleural effusion or pneumothorax. No acute osseous findings are seen. There are postsurgical changes in the cervical spine and left shoulder. Telemetry leads overlie the chest. IMPRESSION: Mild cardiomegaly.  No active cardiopulmonary process identified. Electronically Signed   By: Richardean Sale M.D.   On: 08/06/2017 20:53      Impression / Plan:   Hailey Farrell is a 67 y.o. African-American, very pleasant female with severe iron deficiency anemia secondary to peptic ulcer disease now presents with severe symptomatic anemia. I suspect that her anemia is probably secondary to lack of iron replacement therapy since last admission. She reports having had only 3 days of dark formed bowel movements which is very less likely from recent GI procedures. She has been taking Geritol intermittently which can contribute to dark bowel movements. There is no evidence of active GI bleed She also has borderline low vitamin B12 levels.  She also has recurrent H. pylori infection  Iron deficiency anemia: - Continue full liquid diet - Continue Protonix drip - Monitor CBC closely - She received venofer '400mg'$  today, recommend to administer ferriheme tomorrow - She will need long-term oral iron replacement therapy, probably might benefit from short-term parenteral  iron as outpatient - Recommend vitamin B12 injections daily until discharged, then oral vitamin B12 1027mg weekly - Nothing by mouth past midnight - EGD tomorrow  H. pylori gastritis: Status post triple therapy Repeat biopsies came back positive for recurrent H. pylori infection, treated with bismuth quadruple therapy for 2 weeks in 07/2017 Recommend H. pylori breath test in one to 2 months off PPI to confirm eradication  Thank you for involving me in the care of this patient.      LOS: 1 day   RSherri Sear MD  08/07/2017, 7:36 PM   Note: This dictation was prepared with Dragon dictation along with smaller phrase technology. Any transcriptional errors that result from this process are unintentional.

## 2017-08-07 NOTE — Plan of Care (Signed)
  Progressing Bowel/Gastric: Will show no signs and symptoms of gastrointestinal bleeding 08/07/2017 0921 - Progressing by Etheleen Nicks, RN Note No active bleeding noted today Clinical Measurements: Diagnostic test results will improve 08/07/2017 0921 - Progressing by Etheleen Nicks, RN Note Hgb 7.1/22.1 this am after 3 units PRBC Safety: Ability to remain free from injury will improve 08/07/2017 0921 - Progressing by Etheleen Nicks, RN Note Fall precautions in place, non skid socks in place

## 2017-08-08 ENCOUNTER — Encounter: Payer: Self-pay | Admitting: Anesthesiology

## 2017-08-08 ENCOUNTER — Inpatient Hospital Stay: Payer: Medicare HMO | Admitting: Anesthesiology

## 2017-08-08 ENCOUNTER — Encounter
Admission: EM | Disposition: A | Discharge status: Home / Self Care | Payer: Self-pay | Source: Home / Self Care | Attending: Internal Medicine

## 2017-08-08 DIAGNOSIS — K558 Other vascular disorders of intestine: Secondary | ICD-10-CM

## 2017-08-08 HISTORY — PX: ENTEROSCOPY: SHX5533

## 2017-08-08 LAB — TYPE AND SCREEN
ABO/RH(D): O POS
ANTIBODY SCREEN: NEGATIVE
UNIT DIVISION: 0
Unit division: 0
Unit division: 0

## 2017-08-08 LAB — BPAM RBC
BLOOD PRODUCT EXPIRATION DATE: 201903222359
BLOOD PRODUCT EXPIRATION DATE: 201903252359
Blood Product Expiration Date: 201903222359
ISSUE DATE / TIME: 201903042343
ISSUE DATE / TIME: 201903041910
ISSUE DATE / TIME: 201903041910
Unit Type and Rh: 5100
Unit Type and Rh: 5100
Unit Type and Rh: 5100

## 2017-08-08 LAB — HEMOGLOBIN: HEMOGLOBIN: 7.2 g/dL — AB (ref 12.0–16.0)

## 2017-08-08 SURGERY — EGD (ESOPHAGOGASTRODUODENOSCOPY)
Anesthesia: General

## 2017-08-08 SURGERY — ENTEROSCOPY
Anesthesia: General

## 2017-08-08 MED ORDER — CYANOCOBALAMIN 1000 MCG PO TABS: 1000.0000 ug | ORAL_TABLET | Freq: Every day | ORAL | 0 refills | Status: DC

## 2017-08-08 MED ORDER — FERROUS SULFATE 325 (65 FE) MG PO TABS: 325.0000 mg | ORAL_TABLET | Freq: Two times a day (BID) | ORAL | 0 refills | Status: DC

## 2017-08-08 MED ORDER — FERROUS SULFATE 325 (65 FE) MG PO TABS: 325.0000 mg | ORAL_TABLET | Freq: Two times a day (BID) | ORAL | Status: DC

## 2017-08-08 MED ORDER — VITAMIN B-12 1000 MCG PO TABS: 1000.0000 ug | ORAL_TABLET | Freq: Every day | ORAL | Status: DC

## 2017-08-08 MED ORDER — SODIUM CHLORIDE 0.9 % IV SOLN
INTRAVENOUS | Status: DC | PRN
  Administered 2017-08-08: 13:00:00 via INTRAVENOUS

## 2017-08-08 MED ORDER — CYANOCOBALAMIN 1000 MCG/ML IJ SOLN
1000.0000 ug | Freq: Once | INTRAMUSCULAR | Status: AC
  Administered 2017-08-08: 10:00:00 1000 ug via INTRAMUSCULAR
  Filled 2017-08-08: qty 1

## 2017-08-08 MED ORDER — PANTOPRAZOLE SODIUM 40 MG PO TBEC: 40.0000 mg | DELAYED_RELEASE_TABLET | Freq: Two times a day (BID) | ORAL | 0 refills | Status: DC

## 2017-08-08 MED ORDER — ENSURE ENLIVE PO LIQD: 237.0000 mL | Freq: Two times a day (BID) | ORAL | 0 refills | Status: DC

## 2017-08-08 MED ORDER — PROPOFOL 10 MG/ML IV BOLUS
INTRAVENOUS | Status: DC | PRN
  Administered 2017-08-08: 50 mg via INTRAVENOUS

## 2017-08-08 MED ORDER — PROPOFOL 500 MG/50ML IV EMUL
INTRAVENOUS | Status: AC
  Filled 2017-08-08: qty 50

## 2017-08-08 MED ORDER — PROPOFOL 500 MG/50ML IV EMUL
INTRAVENOUS | Status: DC | PRN
  Administered 2017-08-08: 150 ug/kg/min via INTRAVENOUS

## 2017-08-08 MED ORDER — PROPOFOL 10 MG/ML IV BOLUS
INTRAVENOUS | Status: AC
Start: 2017-08-08 — End: ?
  Filled 2017-08-08: qty 20

## 2017-08-08 NOTE — Anesthesia Preprocedure Evaluation (Signed)
Anesthesia Evaluation  Patient identified by MRN, date of birth, ID band Patient awake    Reviewed: Allergy & Precautions, NPO status , Patient's Chart, lab work & pertinent test results, reviewed documented beta blocker date and time   Airway Mallampati: III  TM Distance: >3 FB     Dental  (+) Chipped, Partial Lower, Partial Upper, Poor Dentition, Missing   Pulmonary COPD, Current Smoker, former smoker,           Cardiovascular Exercise Tolerance: Good hypertension, Pt. on medications and Pt. on home beta blockers + dysrhythmias      Neuro/Psych  Headaches,    GI/Hepatic PUD,   Endo/Other    Renal/GU      Musculoskeletal   Abdominal   Peds  Hematology   Anesthesia Other Findings Patient is NPO appropriate and reports no nausea or vomiting today.  Past Medical History: No date: Breast cancer ([place])     Comment:  Right No date: COPD (chronic obstructive pulmonary disease) ([facility]) No date: Elevated lipids No date: Hypertension No date: Lower extremity edema No date: Migraines No date: Migraines No date: Restless leg syndrome No date: Rotator cuff injury  Past Surgical History: No date: ABDOMINAL HYSTERECTOMY No date: BREAST BIOPSY; Right 07/20/2017: COLONOSCOPY WITH PROPOFOL; N/A     Comment:  Procedure: COLONOSCOPY WITH PROPOFOL;  Surgeon:               Virgel Manifold, MD;  Location: [facility] ENDOSCOPY;                Service: Endoscopy;  Laterality: N/A; 04/13/2017: ESOPHAGOGASTRODUODENOSCOPY; Left     Comment:  Procedure: ESOPHAGOGASTRODUODENOSCOPY (EGD);  Surgeon:               Virgel Manifold, MD;  [facility] ENDOSCOPY;                Service: Endoscopy;  Laterality: Left; 07/20/2017: ESOPHAGOGASTRODUODENOSCOPY (EGD) WITH PROPOFOL; N/A     Comment:  Procedure: ESOPHAGOGASTRODUODENOSCOPY (EGD) WITH               PROPOFOL;  Surgeon: Virgel Manifold, MD;  Location:               [facility]  ENDOSCOPY;  Service: Endoscopy;  Laterality: N/A; No date: JOINT REPLACEMENT     Comment:  Lt knee No date: MEDIAL PARTIAL KNEE REPLACEMENT No date: PARTIAL HYSTERECTOMY 03/01/2016: SHOULDER ARTHROSCOPY WITH OPEN ROTATOR CUFF REPAIR; Left     Comment:  Procedure: SHOULDER ARTHROSCOPY WITH OPEN ROTATOR CUFF               REPAIR;  Surgeon: Thornton Park, MD;  Location: [facility]               ORS;  Service: Orthopedics;  Laterality: Left;  BMI    Body Mass Index:  32.03 kg/m      Reproductive/Obstetrics                             Anesthesia Physical  Anesthesia Plan  ASA: III  Anesthesia Plan: General   Post-op Pain Management:    Induction: Intravenous  PONV Risk Score and Plan: Propofol infusion  Airway Management Planned: Natural Airway and Nasal Cannula  Additional Equipment:   Intra-op Plan:   Post-operative Plan:   Informed Consent: I have reviewed the patients History and Physical, chart, labs and discussed the procedure including the risks, benefits and alternatives for the proposed  anesthesia with the patient or authorized representative who has indicated his/her understanding and acceptance.   Dental Advisory Given  Plan Discussed with: CRNA  Anesthesia Plan Comments: (Patient consented for risks of anesthesia including but not limited to:  - adverse reactions to medications - risk of intubation if required - damage to teeth, lips or other oral mucosa - sore throat or hoarseness - Damage to heart, brain, lungs or loss of life  Patient voiced understanding.)        Anesthesia Quick Evaluation

## 2017-08-08 NOTE — Care Management Note (Signed)
Case Management Note  Patient Details  Name: Hailey Farrell MRN: [id number] Date of Birth: 1950-11-13  Subjective/Objective:    Admitted to [facility] with the diagnosis of anemia. Lives with husband, Fritz Pickerel, [phone]). Last seen Dr. Rebeca Alert about a month ago. Prescriptions are filled at [facility] on [address]. Home Health per [place] in the past. No skilled nursing. No home oxygen. Rolling walker and cane in the home. Takes care of all basic activities of daily living herself, drives. No falls. Good appetite. Husband will transport.                Action/Plan: No follow-up needs identified at this time.   Expected Discharge Date:                  Expected Discharge Plan:     In-House Referral:   yes  Discharge planning Services   yes, if needed  Post Acute Care Choice:    Choice offered to:     DME Arranged:    DME Agency:     HH Arranged:    HH Agency:     Status of Service:     If discussed at [employer], dates discussed:    Additional Comments:  Shelbie Ammons, RN  MSN CCM Care Management [phone] 08/08/2017, 9:55 AM

## 2017-08-08 NOTE — Discharge Summary (Signed)
[facility] [place] NAME: Hailey Farrell    MR#:  [id number]  DATE OF BIRTH:  [birth year]  DATE OF ADMISSION:  08/06/2017 ADMITTING PHYSICIAN: Loletha Grayer, MD  DATE OF DISCHARGE: 08/08/2017  PRIMARY CARE PHYSICIAN: Letta Median, MD    ADMISSION DIAGNOSIS:  Shortness of breath [R06.02] Symptomatic anemia [D64.9] Gastrointestinal hemorrhage, unspecified gastrointestinal hemorrhage type [K92.2] Postural dizziness with presyncope [R42, R55]  DISCHARGE DIAGNOSIS:  Active Problems:   Acute post-hemorrhagic anemia   SECONDARY DIAGNOSIS:   Past Medical History:  Diagnosis Date  . Breast cancer ([place])    Right  . COPD (chronic obstructive pulmonary disease) ([place])   . Elevated lipids   . Hypertension   . Lower extremity edema   . Migraines   . Migraines   . Restless leg syndrome   . Rotator cuff injury     HOSPITAL COURSE:   1.  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  The patient was admitted with a hemoglobin of 3.7.  The patient was found to have angiectasia's that were not bleeding on endoscopy.  Case discussed with Dr. Marius Ditch gastroenterology and she was okay with discharge for the patient today in follow-up with gastroenterology as outpatient along with hematology.  The patient did receive 2 iron infusions while here and a total of 3 units of packed red blood cells.  Hemoglobin upon discharge 7.2.  Protonix twice daily.  Recent endoscopy showing 2 duodenal ulcers.  The patient was also advised to stop eating ice.  Oral iron also recommended by gastroenterology. 2.  Vitamin B12 deficiency.  2 days of IM B12 were given and oral B12 upon discharge. 3.  COPD.  Respiratory status stable 4.  Restless leg syndrome on Requip 5.  Essential hypertension on Toprol.  Continue to hold lisinopril. 6.  History of breast cancer 7.  History of migraines 8.  Tobacco abuse 9.  Hypokalemia continue potassium supplementation orally  DISCHARGE  CONDITIONS:   Satisfactory  CONSULTS OBTAINED:  Treatment Team:  Lin Landsman, MD  DRUG ALLERGIES:   Allergies  Allergen Reactions  . Latex Rash    DISCHARGE MEDICATIONS:   Allergies as of 08/08/2017      Reactions   Latex Rash      Medication List    STOP taking these medications   doxycycline 100 MG tablet Commonly known as:  VIBRA-TABS   lisinopril 20 MG tablet Commonly known as:  PRINIVIL,ZESTRIL   metroNIDAZOLE 250 MG tablet Commonly known as:  FLAGYL     TAKE these medications   budesonide-formoterol 160-4.5 MCG/ACT inhaler Commonly known as:  SYMBICORT Inhale 2 puffs daily into the lungs.   cyanocobalamin 1000 MCG tablet Take 1 tablet (1,000 mcg total) by mouth daily. Start taking on:  08/09/2017   feeding supplement (ENSURE ENLIVE) Liqd Take 237 mLs by mouth 2 (two) times daily between meals.   ferrous sulfate 325 (65 FE) MG tablet Take 1 tablet (325 mg total) by mouth 2 (two) times daily with a meal.   furosemide 20 MG tablet Commonly known as:  LASIX Take 20 mg daily by mouth.   metoprolol succinate 50 MG 24 hr tablet Commonly known as:  TOPROL-XL Take 50 mg by mouth daily. Take with or immediately following a meal.   pantoprazole 40 MG tablet Commonly known as:  PROTONIX Take 1 tablet (40 mg total) by mouth 2 (two) times daily.   potassium chloride 10 MEQ CR capsule Commonly known as:  MICRO-K Take 10 mEq by mouth daily.   rOPINIRole 0.5 MG tablet Commonly known as:  REQUIP Take 0.5 mg by mouth at bedtime.   simvastatin 20 MG tablet Commonly known as:  ZOCOR Take 20 mg by mouth daily at 6 PM.   SUMAtriptan 6 MG/0.5ML Soln injection Commonly known as:  IMITREX Inject 6 mg every 2 (two) hours as needed into the skin for migraine or headache. May repeat in 2 hours if headache persists or recurs.   tiotropium 18 MCG inhalation capsule Commonly known as:  SPIRIVA Place 18 mcg into inhaler and inhale daily.        DISCHARGE  INSTRUCTIONS:    Follow-up PMD 1 week, follow-up with GI 1 week Follow-up with hematology in a few weeks  If you experience worsening of your admission symptoms, develop shortness of breath, life threatening emergency, suicidal or homicidal thoughts you must seek medical attention immediately by calling 911 or calling your MD immediately  if symptoms less severe.  You Must read complete instructions/literature along with all the possible adverse reactions/side effects for all the Medicines you take and that have been prescribed to you. Take any new Medicines after you have completely understood and accept all the possible adverse reactions/side effects.   Please note  You were cared for by a hospitalist during your hospital stay. If you have any questions about your discharge medications or the care you received while you were in the hospital after you are discharged, you can call the unit and asked to speak with the hospitalist on call if the hospitalist that took care of you is not available. Once you are discharged, your primary care physician will handle any further medical issues. Please note that NO REFILLS for any discharge medications will be authorized once you are discharged, as it is imperative that you return to your primary care physician (or establish a relationship with a primary care physician if you do not have one) for your aftercare needs so that they can reassess your need for medications and monitor your lab values.    Today   CHIEF COMPLAINT:   Chief Complaint  Patient presents with  . Weakness    HISTORY OF PRESENT ILLNESS:  Hailey Farrell  is a 67 y.o. female  came in with weakness    VITAL SIGNS:  Blood pressure 126/75, pulse 76, temperature (!) 97 F (36.1 C), resp. rate 19, height 5\' 3"  (1.6 m), weight 82 kg (180 lb 12.8 oz), SpO2 93 %.    PHYSICAL EXAMINATION:  GENERAL:  67 y.o.-year-old patient lying in the bed with no acute distress.  EYES: Pupils  equal, round, reactive to light and accommodation. No scleral icterus. Extraocular muscles intact.  HEENT: Head atraumatic, normocephalic. Oropharynx and nasopharynx clear.  NECK:  Supple, no jugular venous distention. No thyroid enlargement, no tenderness.  LUNGS: Normal breath sounds bilaterally, no wheezing, rales,rhonchi or crepitation. No use of accessory muscles of respiration.  CARDIOVASCULAR: S1, S2 normal. No murmurs, rubs, or gallops.  ABDOMEN: Soft, non-tender, non-distended. Bowel sounds present. No organomegaly or mass.  EXTREMITIES: No pedal edema, cyanosis, or clubbing.  NEUROLOGIC: Cranial nerves II through XII are intact. Muscle strength 5/5 in all extremities. Sensation intact. Gait not checked.  PSYCHIATRIC: The patient is alert and oriented x 3.  SKIN: No obvious rash, lesion, or ulcer.   DATA REVIEW:   CBC Recent Labs  Lab 08/07/17 0411 08/08/17 0947  WBC 6.3  --   HGB 7.1* 7.2*  HCT 22.1*  --   PLT 384  --     Chemistries  Recent Labs  Lab 08/06/17 1805 08/07/17 0411  NA 140 140  K 3.3* 3.4*  CL 107 107  CO2 23 25  GLUCOSE 103* 86  BUN 15 13  CREATININE 0.75 0.78  CALCIUM 8.7* 8.5*  AST 17  --   ALT 8*  --   ALKPHOS 32*  --   BILITOT 0.5  --     Cardiac Enzymes Recent Labs  Lab 08/06/17 1805  TROPONINI <0.03      RADIOLOGY:  Dg Chest Port 1 View  Result Date: 08/06/2017 CLINICAL DATA:  Weakness and shortness of breath over the last 2 or 3 weeks. EXAM: PORTABLE CHEST 1 VIEW COMPARISON:  None. FINDINGS: 2036 hours. The heart is enlarged. There is mild aortic tortuosity. The lungs are clear. There is no pleural effusion or pneumothorax. No acute osseous findings are seen. There are postsurgical changes in the cervical spine and left shoulder. Telemetry leads overlie the chest. IMPRESSION: Mild cardiomegaly.  No active cardiopulmonary process identified. Electronically Signed   By: Richardean Sale M.D.   On: 08/06/2017 20:53     Management  plans discussed with the patient, family and they are in agreement.  CODE STATUS:     Code Status Orders  (From admission, onward)        Start     Ordered   08/06/17 2027  Full code  Continuous     08/06/17 2027    Code Status History    Date Active Date Inactive Code Status Order ID Comments User Context   04/11/2017 22:04 04/13/2017 21:24 Full Code [id number]  Bettey Costa, MD Inpatient   03/01/2016 19:40 03/03/2016 12:57 Full Code [id number]  Thornton Park, MD Inpatient      TOTAL TIME TAKING CARE OF THIS PATIENT: 35 minutes.    Loletha Grayer M.D on 08/08/2017 at 3:10 PM  Between 7am to 6pm - Pager - [phone]  After 6pm go to [email or web address] - password [email or web address]  [facility] Office  [phone]  CC: Primary care physician; Letta Median, MD

## 2017-08-08 NOTE — Discharge Instructions (Signed)
Stop Eating ICE

## 2017-08-08 NOTE — Care Management Important Message (Signed)
Important Message  Patient Details  Name: Hailey Farrell MRN: [id number] Date of Birth: [birth year]   Medicare Important Message Given:  Yes    Shelbie Ammons, RN 08/08/2017, 6:58 AM

## 2017-08-08 NOTE — Progress Notes (Signed)
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 pylori infection - Recheck CBC and iron stores in 1 week - Follow-up with Dr. Bonna Gains in 1 week - Okay to discharge patient today from GI standpoint as she is clinically doing better  GI will sign off, please call us back with questions  Cephas Darby, MD [address]  [place], [place] [address]  Main: [phone]  Fax: [phone] Pager: [phone]

## 2017-08-08 NOTE — Op Note (Signed)
[facility] Gastroenterology Patient Name: Hailey Farrell Procedure Date: 08/08/2017 12:53 PM MRN: [id number] Account #: [id number] Date of Birth: May 28, 1951 Admit Type: Inpatient Age: 67 Room: [facility] ENDO ROOM 2 Gender: Female Note Status: Finalized Procedure:            Small bowel enteroscopy Indications:          Iron deficiency anemia secondary to chronic blood loss,                        GI bleeding source not documented by previous EGD and                        colonoscopy, Obscure gastrointestinal bleeding Providers:            Lin Landsman MD, MD Referring MD:         Baxter Kail. Rebeca Alert MD, MD (Referring MD) Medicines:            Monitored Anesthesia Care Complications:        No immediate complications. Estimated blood loss: None. Procedure:            Pre-Anesthesia Assessment:                       - Prior to the procedure, a History and Physical was                        performed, and patient medications and allergies were                        reviewed. The patient is competent. The risks and                        benefits of the procedure and the sedation options and                        risks were discussed with the patient. All questions                        were answered and informed consent was obtained.                        Patient identification and proposed procedure were                        verified by the physician, the nurse, the                        anesthesiologist, the anesthetist and the technician in                        the pre-procedure area in the procedure room in the                        endoscopy suite. Mental Status Examination: alert and                        oriented. Airway Examination: normal oropharyngeal                        airway and neck mobility.  Respiratory Examination:                        clear to auscultation. CV Examination: normal.                        Prophylactic Antibiotics: The patient  does not require                        prophylactic antibiotics. Prior Anticoagulants: The                        patient has taken no previous anticoagulant or                        antiplatelet agents. ASA Grade Assessment: III - A                        patient with severe systemic disease. After reviewing                        the risks and benefits, the patient was deemed in                        satisfactory condition to undergo the procedure. The                        anesthesia plan was to use monitored anesthesia care                        (MAC). Immediately prior to administration of                        medications, the patient was re-assessed for adequacy                        to receive sedatives. The heart rate, respiratory rate,                        oxygen saturations, blood pressure, adequacy of                        pulmonary ventilation, and response to care were                        monitored throughout the procedure. The physical status                        of the patient was re-assessed after the procedure.                       After obtaining informed consent, the endoscope was                        passed under direct vision. Throughout the procedure,                        the patient's blood pressure, pulse, and oxygen                        saturations were monitored continuously.  The                        Colonoscope was introduced through the mouth and                        advanced to the mid-jejunum. The small bowel                        enteroscopy was accomplished without difficulty. The                        patient tolerated the procedure well. Findings:      A single angioectasia with no bleeding was found in the mid-jejunum.       Coagulation for bleeding prevention using argon plasma was successful.      A few angioectasias with no bleeding were found in the duodenal bulb and       in the second portion of the duodenum. Coagulation  for bleeding       prevention using argon plasma was successful.      A single 5 mm no bleeding angioectasia was found on the greater       curvature of the gastric body. Coagulation for bleeding prevention using       argon plasma was successful. Impression:           - A single non-bleeding angioectasia in the jejunum.                        Treated with argon plasma coagulation (APC).                       - A few non-bleeding angioectasias in the duodenum.                        Treated with argon plasma coagulation (APC).                       - A single non-bleeding angioectasia in the stomach.                        Treated with argon plasma coagulation (APC).                       - No specimens collected. Recommendation:       - Return patient to hospital ward for ongoing care.                       - Return to GI clinic in 1 week. Procedure Code(s):    --- Professional ---                       [phone], Small intestinal endoscopy, enteroscopy beyond                        second portion of duodenum, not including ileum; with                        control of bleeding (eg, injection, bipolar cautery,                        unipolar cautery,  laser, heater probe, stapler, plasma                        coagulator) Diagnosis Code(s):    --- Professional ---                       K55.20, Angiodysplasia of colon without hemorrhage                       K31.819, Angiodysplasia of stomach and duodenum without                        bleeding                       D50.0, Iron deficiency anemia secondary to blood loss                        (chronic)                       K92.2, Gastrointestinal hemorrhage, unspecified CPT copyright 2016 American Medical Association. All rights reserved. The codes documented in this report are preliminary and upon coder review may  be revised to meet current compliance requirements. Dr. Ulyess Mort Lin Landsman MD, MD 08/08/2017 1:41:14 PM This  report has been signed electronically. Number of Addenda: 0 Note Initiated On: 08/08/2017 12:53 PM      [facility]

## 2017-08-08 NOTE — Transfer of Care (Signed)
Immediate Anesthesia Transfer of Care Note  Patient: Hailey Farrell  Procedure(s) Performed: ENTEROSCOPY (N/A )  Patient Location: PACU  Anesthesia Type:General  Level of Consciousness: sedated  Airway & Oxygen Therapy: Patient Spontanous Breathing and Patient connected to nasal cannula oxygen  Post-op Assessment: Report given to RN and Post -op Vital signs reviewed and stable  Post vital signs: Reviewed and stable  Last Vitals:  Vitals:   08/08/17 0837 08/08/17 1222  BP: 131/69 128/61  Pulse: 74 80  Resp: 18 18  Temp:  36.8 C  SpO2: 96% 98%    Last Pain:  Vitals:   08/08/17 1222  TempSrc: Oral  PainSc:          Complications: No apparent anesthesia complications

## 2017-08-08 NOTE — Plan of Care (Signed)
Currently NPO for EGD today. No complaints of pain or discomfort. No active signs/sx of bleeding. Headache earlier on the shift but none since. Supportive spouse at bedside. Up ad lib independently.  Education: Ability to identify signs and symptoms of gastrointestinal bleeding will improve 08/08/2017 0406 - Progressing by Jeffie Pollock, RN   Bowel/Gastric: Will show no signs and symptoms of gastrointestinal bleeding 08/08/2017 0406 - Progressing by Jeffie Pollock, RN   Fluid Volume: Will show no signs and symptoms of excessive bleeding 08/08/2017 0406 - Progressing by Jeffie Pollock, RN   Clinical Measurements: Complications related to the disease process, condition or treatment will be avoided or minimized 08/08/2017 0406 - Progressing by Jeffie Pollock, RN   Education: Knowledge of General Education information will improve 08/08/2017 0406 - Progressing by Jeffie Pollock, RN   Clinical Measurements: Ability to maintain clinical measurements within normal limits will improve 08/08/2017 0406 - Progressing by Jeffie Pollock, RN Will remain free from infection 08/08/2017 0406 - Progressing by Jeffie Pollock, RN Diagnostic test results will improve 08/08/2017 0406 - Progressing by Jeffie Pollock, RN Respiratory complications will improve 08/08/2017 0406 - Progressing by Jeffie Pollock, RN   Pain Managment: General experience of comfort will improve 08/08/2017 0406 - Progressing by Jeffie Pollock, RN   Safety: Ability to remain free from injury will improve 08/08/2017 0406 - Progressing by Jeffie Pollock, RN   Skin Integrity: Risk for impaired skin integrity will decrease 08/08/2017 0406 - Progressing by Jeffie Pollock, RN

## 2017-08-08 NOTE — Anesthesia Procedure Notes (Signed)
Date/Time: 08/08/2017 1:07 PM Performed by: Demetrius Charity, CRNA Pre-anesthesia Checklist: Patient identified, Emergency Drugs available, Suction available, Patient being monitored and Timeout performed Oxygen Delivery Method: Nasal cannula

## 2017-08-08 NOTE — Anesthesia Postprocedure Evaluation (Signed)
Anesthesia Post Note  Patient: Hailey Farrell  Procedure(s) Performed: ENTEROSCOPY (N/A )  Patient location during evaluation: PACU Anesthesia Type: General Level of consciousness: awake and alert and oriented Pain management: pain level controlled Vital Signs Assessment: post-procedure vital signs reviewed and stable Respiratory status: spontaneous breathing Cardiovascular status: blood pressure returned to baseline Anesthetic complications: no     Last Vitals:  Vitals:   08/08/17 1410 08/08/17 1420  BP: 116/75 126/75  Pulse: 74 76  Resp: (!) 21 19  Temp:    SpO2: 100% 93%    Last Pain:  Vitals:   08/08/17 1222  TempSrc: Oral  PainSc:                  Shannen Vernon

## 2017-08-08 NOTE — Progress Notes (Signed)
Discharge instructions along with home medications and follow up gone over with patient and husband. Both verbalize that they understood instructions. 1 prescription given to patient. IV's and tele removed. Pt being discharged home on room air, no distress noted. Ammie Dalton, RN

## 2017-08-08 NOTE — Anesthesia Post-op Follow-up Note (Signed)
Anesthesia QCDR form completed.        

## 2017-08-09 ENCOUNTER — Encounter: Payer: Self-pay | Admitting: Gastroenterology

## 2017-08-10 ENCOUNTER — Other Ambulatory Visit: Payer: Self-pay

## 2017-08-13 ENCOUNTER — Telehealth: Payer: Self-pay

## 2017-08-13 DIAGNOSIS — D509 Iron deficiency anemia, unspecified: Secondary | ICD-10-CM

## 2017-08-13 NOTE — Telephone Encounter (Signed)
-----   Message from Virgel Manifold, MD sent at 08/08/2017  3:27 PM EST ----- Can you please refer this patient to hematology for Iron deficiency anemia and IV iron transfusions. Thank you

## 2017-08-13 NOTE — Telephone Encounter (Signed)
Pt notified that a referral was placed for hematology and to be expecting a call.

## 2017-08-19 DIAGNOSIS — D509 Iron deficiency anemia, unspecified: Secondary | ICD-10-CM | POA: Insufficient documentation

## 2017-08-19 NOTE — Progress Notes (Signed)
[place]  [phone] [phone]  ID: ORLENE SALMONS OB: 11-Oct-1950  MR#: [id number]  XBD#:[id number]  Patient Care Team: Letta Median, MD as PCP - General (Family Medicine)  CHIEF COMPLAINT:  Iron deficiency anemia  INTERVAL HISTORY: Patient is a 67 year old female who was recently in the hospital for significant iron deficiency anemia requiring several units of packed red blood cells.  She also reports receiving IV iron at that time.  Subsequent luminal investigation revealed several nonbleeding ulcers that were treated with argon laser.  She is anxious, but otherwise feels well.  She continues to have mild weakness and fatigue.  She has no neurologic complaints.  She denies any recent fevers.  She has a good appetite and denies weight loss.  She has no chest pain or shortness of breath.  She denies any nausea, vomiting, constipation, or diarrhea.  She has no further melena or hematochezia.  She has no urinary complaints.  Patient otherwise feels well and offers no further specific complaints today.  REVIEW OF SYSTEMS:   Review of Systems  Constitutional: Positive for malaise/fatigue. Negative for fever and weight loss.  Respiratory: Negative.  Negative for cough, hemoptysis and shortness of breath.   Cardiovascular: Negative.  Negative for chest pain.  Gastrointestinal: Negative.  Negative for abdominal pain, blood in stool and melena.  Genitourinary: Negative.  Negative for hematuria.  Musculoskeletal: Negative.   Skin: Negative.  Negative for rash.  Neurological: Positive for weakness. Negative for sensory change and focal weakness.  Psychiatric/Behavioral: The patient is nervous/anxious.     As per HPI. Otherwise, a complete review of systems is negative.  PAST MEDICAL HISTORY: Past Medical History:  Diagnosis Date  . Breast cancer ([place])    Right  . COPD (chronic obstructive pulmonary disease) ([place])   . Elevated lipids   .  Hypertension   . Lower extremity edema   . Migraines   . Migraines   . Restless leg syndrome   . Rotator cuff injury     PAST SURGICAL HISTORY: Past Surgical History:  Procedure Laterality Date  . ABDOMINAL HYSTERECTOMY    . BREAST BIOPSY Right   . COLONOSCOPY WITH PROPOFOL N/A 07/20/2017   Procedure: COLONOSCOPY WITH PROPOFOL;  Surgeon: Virgel Manifold, MD;  Location: [facility] ENDOSCOPY;  Service: Endoscopy;  Laterality: N/A;  . ENTEROSCOPY N/A 08/08/2017   Procedure: ENTEROSCOPY;  Surgeon: Lin Landsman, MD;  Location: [facility] ENDOSCOPY;  Service: Gastroenterology;  Laterality: N/A;  . ESOPHAGOGASTRODUODENOSCOPY Left 04/13/2017   Procedure: ESOPHAGOGASTRODUODENOSCOPY (EGD);  Surgeon: Virgel Manifold, MD;  Location: [facility] ENDOSCOPY;  Service: Endoscopy;  Laterality: Left;  . ESOPHAGOGASTRODUODENOSCOPY (EGD) WITH PROPOFOL N/A 07/20/2017   Procedure: ESOPHAGOGASTRODUODENOSCOPY (EGD) WITH PROPOFOL;  Surgeon: Virgel Manifold, MD;  Location: [facility] ENDOSCOPY;  Service: Endoscopy;  Laterality: N/A;  . JOINT REPLACEMENT     Lt knee  . MEDIAL PARTIAL KNEE REPLACEMENT    . PARTIAL HYSTERECTOMY    . SHOULDER ARTHROSCOPY WITH OPEN ROTATOR CUFF REPAIR Left 03/01/2016   Procedure: SHOULDER ARTHROSCOPY WITH OPEN ROTATOR CUFF REPAIR;  Surgeon: Thornton Park, MD;  Location: [facility] ORS;  Service: Orthopedics;  Laterality: Left;    FAMILY HISTORY: Family History  Problem Relation Age of Onset  . CAD Mother   . CAD Sister     ADVANCED DIRECTIVES (Y/N):  N  HEALTH MAINTENANCE: Social History   Tobacco Use  . Smoking status: Current Some Day Smoker    Packs/day: 0.10    Years: 50.00  Pack years: 5.00    Types: Cigarettes    Last attempt to quit: 03/13/2016    Years since quitting: 1.4  . Smokeless tobacco: Never Used  Substance Use Topics  . Alcohol use: No  . Drug use: No     Colonoscopy:  PAP:  Bone density:  Lipid panel:  Allergies  Allergen Reactions  . Latex Rash      Current Outpatient Medications  Medication Sig Dispense Refill  . Bismuth Subsalicylate 527 MG TABS Take 2 tablets (524 mg total) by mouth 4 (four) times daily. 108 each 0  . budesonide-formoterol (SYMBICORT) 160-4.5 MCG/ACT inhaler Inhale 2 puffs daily into the lungs.     . doxycycline (VIBRA-TABS) 100 MG tablet Take 1 tablet (100 mg total) by mouth 2 (two) times daily. 28 tablet 0  . feeding supplement, ENSURE ENLIVE, (ENSURE ENLIVE) LIQD Take 237 mLs by mouth 2 (two) times daily between meals. 60 Bottle 0  . ferrous sulfate 325 (65 FE) MG tablet Take 1 tablet (325 mg total) by mouth 2 (two) times daily with a meal. 60 tablet 0  . furosemide (LASIX) 20 MG tablet Take 20 mg daily by mouth.     . metroNIDAZOLE (FLAGYL) 250 MG tablet Take 1 tablet (250 mg total) by mouth 4 (four) times daily. 56 tablet 0  . pantoprazole (PROTONIX) 40 MG tablet Take 1 tablet (40 mg total) by mouth 2 (two) times daily. 60 tablet 0  . potassium chloride (MICRO-K) 10 MEQ CR capsule Take 10 mEq by mouth daily.    Marland Kitchen rOPINIRole (REQUIP) 0.5 MG tablet Take 0.5 mg by mouth at bedtime.    . simvastatin (ZOCOR) 20 MG tablet Take 20 mg by mouth daily at 6 PM.    . tiotropium (SPIRIVA) 18 MCG inhalation capsule Place 18 mcg into inhaler and inhale daily.    . vitamin B-12 1000 MCG tablet Take 1 tablet (1,000 mcg total) by mouth daily. 30 tablet 0  . metoprolol succinate (TOPROL-XL) 50 MG 24 hr tablet Take 50 mg by mouth daily. Take with or immediately following a meal.    . Polyethylene Glycol 3350 (PEG 3350) POWD Take by mouth.    . SUMAtriptan (IMITREX) 6 MG/0.5ML SOLN injection Inject 6 mg every 2 (two) hours as needed into the skin for migraine or headache. May repeat in 2 hours if headache persists or recurs.     No current facility-administered medications for this visit.     OBJECTIVE: Vitals:   08/21/17 1553  BP: 124/74  Pulse: 88  Resp: 18  Temp: (!) 97.1 F (36.2 C)     Body mass index is 32.59 kg/m.     ECOG FS:0 - Asymptomatic  General: Well-developed, well-nourished, no acute distress. Eyes: Pink conjunctiva, anicteric sclera. HEENT: Normocephalic, moist mucous membranes, clear oropharnyx. Lungs: Clear to auscultation bilaterally. Heart: Regular rate and rhythm. No rubs, murmurs, or gallops. Abdomen: Soft, nontender, nondistended. No organomegaly noted, normoactive bowel sounds. Musculoskeletal: No edema, cyanosis, or clubbing. Neuro: Alert, answering all questions appropriately. Cranial nerves grossly intact. Skin: No rashes or petechiae noted. Psych: Normal affect. Lymphatics: No cervical, calvicular, axillary or inguinal LAD.   LAB RESULTS:  Lab Results  Component Value Date   NA 140 08/07/2017   K 3.4 (L) 08/07/2017   CL 107 08/07/2017   CO2 25 08/07/2017   GLUCOSE 86 08/07/2017   BUN 13 08/07/2017   CREATININE 0.78 08/07/2017   CALCIUM 8.5 (L) 08/07/2017   PROT 6.0 (L) 08/06/2017  ALBUMIN 3.5 08/06/2017   AST 17 08/06/2017   ALT 8 (L) 08/06/2017   ALKPHOS 32 (L) 08/06/2017   BILITOT 0.5 08/06/2017   GFRNONAA >60 08/07/2017   GFRAA >60 08/07/2017    Lab Results  Component Value Date   WBC 7.4 08/21/2017   NEUTROABS 4.7 08/21/2017   HGB 9.8 (L) 08/21/2017   HCT 31.5 (L) 08/21/2017   MCV 77.8 (L) 08/21/2017   PLT 601 (H) 08/21/2017   Lab Results  Component Value Date   IRON 87 08/21/2017   TIBC 289 08/21/2017   IRONPCTSAT 30 08/21/2017   Lab Results  Component Value Date   FERRITIN 109 08/21/2017     STUDIES: Dg Chest Port 1 View  Result Date: 08/06/2017 CLINICAL DATA:  Weakness and shortness of breath over the last 2 or 3 weeks. EXAM: PORTABLE CHEST 1 VIEW COMPARISON:  None. FINDINGS: 2036 hours. The heart is enlarged. There is mild aortic tortuosity. The lungs are clear. There is no pleural effusion or pneumothorax. No acute osseous findings are seen. There are postsurgical changes in the cervical spine and left shoulder. Telemetry leads overlie  the chest. IMPRESSION: Mild cardiomegaly.  No active cardiopulmonary process identified. Electronically Signed   By: Richardean Sale M.D.   On: 08/06/2017 20:53    ASSESSMENT: Iron deficiency anemia  PLAN:    1.  Iron deficiency anemia: Likely secondary to GI blood loss.  Upper endoscopy completed on July 20, 2017 revealed 2 nonbleeding duodenal angioectasia that were treated with argon laser.  Colonoscopy on the same date was essentially within normal limits except for 2 nonmalignant polyps.  Capsule endoscopy on August 08, 2017 revealed an additional nonbleeding angioectasia in the jejunum as well as a stomach that were also treated with argon laser.  Patient's hemoglobin remains decreased, but significantly improved since her hospitalization.  Her iron stores have also improved.  She will return to clinic in 1 week to receive an additional 510 mg of IV Feraheme.  Patient will then return to clinic in 3 months with repeat laboratory work and further evaluation.  Approximately 45 minutes was spent in discussion of which greater than 50% was consultation.  Patient expressed understanding and was in agreement with this plan. She also understands that She can call clinic at any time with any questions, concerns, or complaints.    Lloyd Huger, MD   08/22/2017 1:42 PM

## 2017-08-21 ENCOUNTER — Ambulatory Visit: Payer: Self-pay

## 2017-08-21 ENCOUNTER — Encounter: Payer: Self-pay | Admitting: Oncology

## 2017-08-21 ENCOUNTER — Ambulatory Visit (INDEPENDENT_AMBULATORY_CARE_PROVIDER_SITE_OTHER): Payer: Medicare HMO | Admitting: Gastroenterology

## 2017-08-21 ENCOUNTER — Inpatient Hospital Stay: Payer: Medicare HMO | Attending: Oncology | Admitting: Oncology

## 2017-08-21 ENCOUNTER — Other Ambulatory Visit: Payer: Self-pay

## 2017-08-21 ENCOUNTER — Encounter: Payer: Self-pay | Admitting: Gastroenterology

## 2017-08-21 VITALS — BP 137/83 | HR 94 | Temp 98.2°F | Ht 63.0 in | Wt 184.0 lb

## 2017-08-21 DIAGNOSIS — D509 Iron deficiency anemia, unspecified: Secondary | ICD-10-CM

## 2017-08-21 DIAGNOSIS — D500 Iron deficiency anemia secondary to blood loss (chronic): Secondary | ICD-10-CM

## 2017-08-21 DIAGNOSIS — Z79899 Other long term (current) drug therapy: Secondary | ICD-10-CM | POA: Diagnosis not present

## 2017-08-21 LAB — IRON AND TIBC
Iron: 87 ug/dL (ref 28–170)
Saturation Ratios: 30 % (ref 10.4–31.8)
TIBC: 289 ug/dL (ref 250–450)
UIBC: 202 ug/dL

## 2017-08-21 LAB — CBC WITH DIFFERENTIAL/PLATELET
BASOS PCT: 1 %
Basophils Absolute: 0.1 10*3/uL (ref 0–0.1)
EOS ABS: 0.1 10*3/uL (ref 0–0.7)
EOS PCT: 1 %
HCT: 31.5 % — ABNORMAL LOW (ref 35.0–47.0)
Hemoglobin: 9.8 g/dL — ABNORMAL LOW (ref 12.0–16.0)
LYMPHS ABS: 2.1 10*3/uL (ref 1.0–3.6)
Lymphocytes Relative: 29 %
MCH: 24.2 pg — AB (ref 26.0–34.0)
MCHC: 31.1 g/dL — ABNORMAL LOW (ref 32.0–36.0)
MCV: 77.8 fL — AB (ref 80.0–100.0)
MONO ABS: 0.4 10*3/uL (ref 0.2–0.9)
Monocytes Relative: 6 %
NEUTROS PCT: 63 %
Neutro Abs: 4.7 10*3/uL (ref 1.4–6.5)
PLATELETS: 601 10*3/uL — AB (ref 150–440)
RBC: 4.05 MIL/uL (ref 3.80–5.20)
RDW: 30.8 % — AB (ref 11.5–14.5)
WBC: 7.4 10*3/uL (ref 3.6–11.0)

## 2017-08-21 LAB — FERRITIN: Ferritin: 109 ng/mL (ref 11–307)

## 2017-08-21 MED ORDER — METRONIDAZOLE 250 MG PO TABS: 250.0000 mg | ORAL_TABLET | Freq: Four times a day (QID) | ORAL | 0 refills | Status: DC

## 2017-08-21 MED ORDER — DOXYCYCLINE HYCLATE 100 MG PO TABS: 100.0000 mg | ORAL_TABLET | Freq: Two times a day (BID) | ORAL | 0 refills | Status: DC

## 2017-08-21 MED ORDER — BISMUTH SUBSALICYLATE 262 MG PO TABS: 524.0000 mg | ORAL_TABLET | Freq: Four times a day (QID) | ORAL | 0 refills | Status: DC

## 2017-08-21 NOTE — Patient Instructions (Signed)
1 month f/u  Steps to Quit Smoking Smoking tobacco can be bad for your health. It can also affect almost every organ in your body. Smoking puts you and people around you at risk for many serious long-lasting (chronic) diseases. Quitting smoking is hard, but it is one of the best things that you can do for your health. It is never too late to quit. What are the benefits of quitting smoking? When you quit smoking, you lower your risk for getting serious diseases and conditions. They can include:  Lung cancer or lung disease.  Heart disease.  Stroke.  Heart attack.  Not being able to have children (infertility).  Weak bones (osteoporosis) and broken bones (fractures).  If you have coughing, wheezing, and shortness of breath, those symptoms may get better when you quit. You may also get sick less often. If you are pregnant, quitting smoking can help to lower your chances of having a baby of low birth weight. What can I do to help me quit smoking? Talk with your doctor about what can help you quit smoking. Some things you can do (strategies) include:  Quitting smoking totally, instead of slowly cutting back how much you smoke over a period of time.  Going to in-person counseling. You are more likely to quit if you go to many counseling sessions.  Using resources and support systems, such as: ? Database administrator with a Social worker. ? Phone quitlines. ? Careers information officer. ? Support groups or group counseling. ? Text messaging programs. ? Mobile phone apps or applications.  Taking medicines. Some of these medicines may have nicotine in them. If you are pregnant or breastfeeding, do not take any medicines to quit smoking unless your doctor says it is okay. Talk with your doctor about counseling or other things that can help you.  Talk with your doctor about using more than one strategy at the same time, such as taking medicines while you are also going to in-person counseling. This can  help make quitting easier. What things can I do to make it easier to quit? Quitting smoking might feel very hard at first, but there is a lot that you can do to make it easier. Take these steps:  Talk to your family and friends. Ask them to support and encourage you.  Call phone quitlines, reach out to support groups, or work with a Social worker.  Ask people who smoke to not smoke around you.  Avoid places that make you want (trigger) to smoke, such as: ? Bars. ? Parties. ? Smoke-break areas at work.  Spend time with people who do not smoke.  Lower the stress in your life. Stress can make you want to smoke. Try these things to help your stress: ? Getting regular exercise. ? Deep-breathing exercises. ? Yoga. ? Meditating. ? Doing a body scan. To do this, close your eyes, focus on one area of your body at a time from head to toe, and notice which parts of your body are tense. Try to relax the muscles in those areas.  Download or buy apps on your mobile phone or tablet that can help you stick to your quit plan. There are many free apps, such as QuitGuide from the [employer] Office manager for Disease Control and Prevention). You can find more support from smokefree.gov and other websites.  This information is not intended to replace advice given to you by your health care provider. Make sure you discuss any questions you have with your health care provider.  Document Released: 03/18/2009 Document Revised: 01/18/2016 Document Reviewed: 10/06/2014 Elsevier Interactive Patient Education  2018 [employer].

## 2017-08-21 NOTE — Addendum Note (Signed)
Addended by: Earl Lagos on: 08/21/2017 01:46 PM   Modules accepted: Orders

## 2017-08-21 NOTE — Addendum Note (Signed)
Addended by: Earl Lagos on: 08/21/2017 12:17 PM   Modules accepted: Orders

## 2017-08-21 NOTE — Progress Notes (Signed)
Vonda Antigua, MD [address]  [place]  [place], [place] [address]  Main: [phone]  Fax: [phone]   Primary Care Physician: Letta Median, MD  Primary Gastroenterologist:  Dr. Vonda Antigua  Chief complaint: Follow-up of recent hospitalization for anemia.   HPI: Hailey Farrell is a 67 y.o. female here for follow-up, since recent hospitalization for anemia.  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  2-3 clean base, 3 mm in greatest dimension.  She was admitted to the hospital with weakness, and had an enteroscopy on March 6 that revealed AVMs, status post APC.  Hemoglobin discharge was around 7.  Hemoglobin on admission was around 3, with no active bleeding at the time of presentation.  She has been taking oral iron since discharge.  Reports weakness has resolved.  Reports good appetite, no weight loss.  No blood in stool, no melena. She denies any NSAID use.  She received IV iron during her hospitalization.  She has appointment with hematologist today.  Patient's initial EGD was in November 2018, that revealed the above-stated ulcers and H. pylori.  EGD was repeated on July 20, 2017, and persistent H. pylori was found on gastric biopsies.  She initially received triple therapy in November 2018, and quadruple therapy was prescribed in February 2019, patient only took 3 to 4 days of the quadruple therapy, and then was hospitalized on August 06, 2017.   Current Outpatient Medications  Medication Sig Dispense Refill  . budesonide-formoterol (SYMBICORT) 160-4.5 MCG/ACT inhaler Inhale 2 puffs daily into the lungs.     . feeding supplement, ENSURE ENLIVE, (ENSURE ENLIVE) LIQD Take 237 mLs by mouth 2 (two) times daily between meals. 60 Bottle 0  . ferrous sulfate 325 (65 FE) MG tablet Take 1 tablet (325 mg total) by mouth 2 (two) times daily with a meal. 60 tablet 0  . furosemide (LASIX) 20 MG tablet Take 20 mg  daily by mouth.     . metoprolol succinate (TOPROL-XL) 50 MG 24 hr tablet Take 50 mg by mouth daily. Take with or immediately following a meal.    . pantoprazole (PROTONIX) 40 MG tablet Take 1 tablet (40 mg total) by mouth 2 (two) times daily. 60 tablet 0  . Polyethylene Glycol 3350 (PEG 3350) POWD Take by mouth.    . potassium chloride (MICRO-K) 10 MEQ CR capsule Take 10 mEq by mouth daily.    Marland Kitchen rOPINIRole (REQUIP) 0.5 MG tablet Take 0.5 mg by mouth at bedtime.    . simvastatin (ZOCOR) 20 MG tablet Take 20 mg by mouth daily at 6 PM.    . SUMAtriptan (IMITREX) 6 MG/0.5ML SOLN injection Inject 6 mg every 2 (two) hours as needed into the skin for migraine or headache. May repeat in 2 hours if headache persists or recurs.    Marland Kitchen tiotropium (SPIRIVA) 18 MCG inhalation capsule Place 18 mcg into inhaler and inhale daily.    . vitamin B-12 1000 MCG tablet Take 1 tablet (1,000 mcg total) by mouth daily. 30 tablet 0   No current facility-administered medications for this visit.     Allergies as of 08/21/2017 - Review Complete 08/21/2017  Allergen Reaction Noted  . Latex Rash 02/03/2015    ROS:  General: Negative for anorexia, weight loss, fever, chills, fatigue, weakness. ENT: Negative for hoarseness, difficulty swallowing , nasal congestion. CV: Negative for chest pain, angina, palpitations, dyspnea on exertion, peripheral edema.  Respiratory: Negative for dyspnea  at rest, dyspnea on exertion, cough, sputum, wheezing.  GI: See history of present illness. GU:  Negative for dysuria, hematuria, urinary incontinence, urinary frequency, nocturnal urination.  Endo: Negative for unusual weight change.    Physical Examination:   BP 137/83   Pulse 94   Temp 98.2 F (36.8 C) (Oral)   Ht 5\' 3"  (1.6 m)   Wt 184 lb (83.5 kg)   BMI 32.59 kg/m   General: Well-nourished, well-developed in no acute distress.  Eyes: No icterus. Conjunctivae pink. Mouth: Oropharyngeal mucosa moist and pink , no lesions  erythema or exudate. Neck: Supple, Trachea midline Abdomen: Bowel sounds are normal, nontender, nondistended, no hepatosplenomegaly or masses, no abdominal bruits or hernia , no rebound or guarding.   Extremities: No lower extremity edema. No clubbing or deformities. Neuro: Alert and oriented x 3.  Grossly intact. Skin: Warm and dry, no jaundice.   Psych: Alert and cooperative, normal mood and affect.   Labs: CMP     Component Value Date/Time   NA 140 08/07/2017 0411   K 3.4 (L) 08/07/2017 0411   CL 107 08/07/2017 0411   CO2 25 08/07/2017 0411   GLUCOSE 86 08/07/2017 0411   BUN 13 08/07/2017 0411   CREATININE 0.78 08/07/2017 0411   CALCIUM 8.5 (L) 08/07/2017 0411   PROT 6.0 (L) 08/06/2017 1805   ALBUMIN 3.5 08/06/2017 1805   AST 17 08/06/2017 1805   ALT 8 (L) 08/06/2017 1805   ALKPHOS 32 (L) 08/06/2017 1805   BILITOT 0.5 08/06/2017 1805   GFRNONAA >60 08/07/2017 0411   GFRAA >60 08/07/2017 0411   Lab Results  Component Value Date   WBC 6.3 08/07/2017   HGB 7.2 (L) 08/08/2017   HCT 22.1 (L) 08/07/2017   MCV 69.1 (L) 08/07/2017   PLT 384 08/07/2017    Imaging Studies: Dg Chest Port 1 View  Result Date: 08/06/2017 CLINICAL DATA:  Weakness and shortness of breath over the last 2 or 3 weeks. EXAM: PORTABLE CHEST 1 VIEW COMPARISON:  None. FINDINGS: 2036 hours. The heart is enlarged. There is mild aortic tortuosity. The lungs are clear. There is no pleural effusion or pneumothorax. No acute osseous findings are seen. There are postsurgical changes in the cervical spine and left shoulder. Telemetry leads overlie the chest. IMPRESSION: Mild cardiomegaly.  No active cardiopulmonary process identified. Electronically Signed   By: Richardean Sale M.D.   On: 08/06/2017 20:53    Assessment and Plan:   JAMILETTE SUCHOCKI is a 67 y.o. 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  No clinical evidence of GI bleeding since the hospitalization Continue oral iron, patient received 2 doses of IV iron during the hospitalization She will follow-up with hematology today, and can be started on IV iron by them.    Continue to avoid NSAIDs Patient was encouraged to quit smoking  We will recheck labs today including ferritin, and iron panel Her gastric biopsies On her February 29, 2019 EGD showed gastric metaplasia.  No dysplasia was reported.  This was discussed with the patient.  After treatment of her H. pylori, repeat gastric mapping biopsy will need to be done.   Patient states she does not have any of her previously prescribed quadruple therapy pills left at home.  She only took 3 to 4 days of  that therapy.  We will thus re-prescribe a 14-day course.  Her last colonoscopy was in February 2019 as well.  2 colon polyps were removed and showed tubular adenoma and hyperplastic polyp.  Repeat is recommended in 5 years.  A cecal lesion was seen, which appeared to be a lipoma.  Biopsies reported that submucosal tissue was not present to assess for a lipoma.  Reported focal minimal active colitis.  Patient does not have any diarrhea, blood in stool, no chronic symptoms of IBD.  Findings may be due to NSAID use or prep effect, or infection.  No endoscopic evidence of inflammation was present throughout the colon.  No further intervention needed in this regard unless patient develops new symptoms in the future.  Dr Vonda Antigua

## 2017-08-21 NOTE — Progress Notes (Signed)
Pt in for new patient visit for Anemia.

## 2017-08-21 NOTE — Addendum Note (Signed)
Addended by: Earl Lagos on: 08/21/2017 12:16 PM   Modules accepted: Orders

## 2017-08-21 NOTE — Addendum Note (Signed)
Addended by: Earl Lagos on: 08/21/2017 01:02 PM   Modules accepted: Orders

## 2017-08-28 ENCOUNTER — Inpatient Hospital Stay: Payer: Medicare HMO

## 2017-08-28 VITALS — BP 136/85 | HR 82 | Resp 18

## 2017-08-28 DIAGNOSIS — D509 Iron deficiency anemia, unspecified: Secondary | ICD-10-CM

## 2017-08-28 MED ORDER — SODIUM CHLORIDE 0.9 % IV SOLN
Freq: Once | INTRAVENOUS | Status: AC
  Administered 2017-08-28: 14:00:00 via INTRAVENOUS
  Filled 2017-08-28: qty 1000

## 2017-08-28 MED ORDER — SODIUM CHLORIDE 0.9 % IV SOLN
510.0000 mg | Freq: Once | INTRAVENOUS | Status: AC
  Administered 2017-08-28: 510 mg via INTRAVENOUS
  Filled 2017-08-28: qty 17

## 2017-09-04 ENCOUNTER — Ambulatory Visit: Payer: Self-pay

## 2017-09-17 ENCOUNTER — Ambulatory Visit: Payer: Medicare HMO | Admitting: Gastroenterology

## 2017-10-17 ENCOUNTER — Other Ambulatory Visit: Payer: Self-pay

## 2017-10-17 ENCOUNTER — Encounter (INDEPENDENT_AMBULATORY_CARE_PROVIDER_SITE_OTHER): Payer: Self-pay

## 2017-10-17 ENCOUNTER — Ambulatory Visit (INDEPENDENT_AMBULATORY_CARE_PROVIDER_SITE_OTHER): Payer: Medicare HMO | Admitting: Gastroenterology

## 2017-10-17 ENCOUNTER — Encounter: Payer: Self-pay | Admitting: Gastroenterology

## 2017-10-17 VITALS — BP 135/84 | HR 81 | Temp 98.0°F | Ht 63.0 in | Wt 184.6 lb

## 2017-10-17 DIAGNOSIS — K3189 Other diseases of stomach and duodenum: Secondary | ICD-10-CM

## 2017-10-17 DIAGNOSIS — Q2730 Arteriovenous malformation, site unspecified: Secondary | ICD-10-CM

## 2017-10-17 DIAGNOSIS — K31A0 Gastric intestinal metaplasia, unspecified: Secondary | ICD-10-CM

## 2017-10-17 DIAGNOSIS — K259 Gastric ulcer, unspecified as acute or chronic, without hemorrhage or perforation: Secondary | ICD-10-CM | POA: Diagnosis not present

## 2017-10-17 DIAGNOSIS — B9689 Other specified bacterial agents as the cause of diseases classified elsewhere: Secondary | ICD-10-CM

## 2017-10-17 NOTE — Progress Notes (Signed)
Vonda Antigua, MD [address]  [place]  [place], [place] [address]  Main: [phone]  Fax: [phone]   Primary Care Physician: Letta Median, MD  Primary Gastroenterologist:  Dr. Vonda Antigua  Chief Complaint  Patient presents with  . Follow-up    HPI: Hailey Farrell is a 67 y.o. female here for follow-up of history of anemia, anxiety and H. 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  Denies any abdominal pain, no nausea or vomiting, no altered bowel habits, good appetite, no weight loss, no hematochezia or melena.  Finished quadruple therapy for H. pylori about a month ago.  Has seen hematology, and started on IV iron.  Previous history: EGD November 2018 The examined esophagus was normal.      2-3 non-bleeding superficial gastric ulcers with no stigmata of bleeding       were found in the gastric antrum. The largest lesion was 3 mm in largest       dimension. Biopsies were taken with a cold forceps for histology. No old       or new blood present. No active bleeding present.      One non-bleeding superficial duodenal ulcer with no stigmata of bleeding       was found in the duodenal bulb. The lesion was 2 mm in largest dimension.      Patchy mildly erythematous mucosa without active bleeding and with no       stigmata of bleeding was found in the duodenal bulb. Impression:           - Normal esophagus.                       - Non-bleeding gastric ulcers with no stigmata of                        bleeding. Biopsied.                       - One non-bleeding duodenal ulcer with no stigmata of                        bleeding.                       - Erythematous duodenopathy.  DIAGNOSIS:  A. STOMACH; COLD BIOPSY:  - MODERATE CHRONIC ACTIVE HELICOBACTER ASSOCIATED GASTRITIS.  - NEGATIVE FOR DYSPLASIA AND MALIGNANCY.   Feb 2019 EGD: Findings:      The examined esophagus was normal.     The entire examined stomach was normal. Biopsies were obtained in the       gastric body, at the incisura and in the gastric antrum with cold       forceps for histology.      One to two non-bleeding superficial duodenal ulcers with no stigmata of       bleeding was found in the duodenal bulb. Biopsies were taken with a cold       forceps for histology. The ulcer is healing and improved compared to       prior procedure in Nov 2018.      The second portion of the duodenum was normal. Impression:           - Normal esophagus.                       -  Normal stomach. Previously seen gastric ulcers are                        not present.                       - One to two non-bleeding duodenal ulcer with no                        stigmata of bleeding. Biopsied.                       - Biopsies were obtained in the gastric body, at the                        incisura and in the gastric antrum. These were done to                        assess for eradication of H. Pylori  DIAGNOSIS:  A. DUODENAL BULB ULCERS; COLD BIOPSY:  - PEPTIC DUODENITIS.  - IHC STAIN FOR H. PYLORI IS NEGATIVE, SEE COMMENT.  - NEGATIVE FOR DYSPLASIA AND MALIGNANCY.   B. STOMACH, ANTRUM, BODY, INCISURA; COLD BIOPSY:  - ANTRAL AND OXYNTIC MUCOSA WITH MILD AND FOCALLY MODERATE CHRONIC  ACTIVE HELICOBACTER ASSOCIATED GASTRITIS, SEE COMMENT.  - ANTRAL MUCOSA WITH FOCAL INTESTINAL METAPLASIA.  - OXYNTIC MUCOSA WITH CHANGES CYSTIC WITH PROTON PUMP INHIBITOR USE..  - NEGATIVE FOR DYSPLASIA AND MALIGNANCY.    March 2019 small bowel enteroscopy: Impression:           - A single non-bleeding angioectasia in the jejunum.                        Treated with argon plasma coagulation (APC).                       - A few non-bleeding angioectasias in the duodenum.                        Treated with argon plasma coagulation (APC).                       - A single non-bleeding angioectasia in the stomach.                         Treated with argon plasma coagulation (APC).    Colonoscopy for average risk screening Feb 2019: Impression:           - One 5 mm polyp in the sigmoid colon, removed with a                        cold snare. Resected and retrieved.                       - One 2 mm polyp in the sigmoid colon, removed with a                        cold biopsy forceps. Resected and retrieved.                       - Thickened folds of the mucosa in the cecum.  Biopsied.                       - Internal hemorrhoids.                       - The examination was otherwise normal.  C. COLON, CECAL LESION; COLD BIOPSY:  - FOCAL MINIMAL ACTIVE COLITIS, SEE COMMENT.  - SUBMUCOSAL TISSUE TO ASSESS FOR A LIPOMA IS NOT PRESENT FOR  EVALUATION.  - NEGATIVE FOR DYSPLASIA AND MALIGNANCY.   D. COLON POLYP 2, SIGMOID; COLD SNARE:  - TUBULAR ADENOMA (2).  - HYPERPLASTIC POLYP (1).  - NEGATIVE FOR HIGH GRADE DYSPLASIA AND MALIGNANCY.   "A cecal lesion was seen, which appeared to be a lipoma.  Biopsies reported that submucosal tissue was not present to assess for a lipoma.  Reported focal minimal active colitis.  Patient does not have any diarrhea, blood in stool, no chronic symptoms of IBD.  Findings may be due to NSAID use or prep effect, or infection.  No endoscopic evidence of inflammation was present throughout the colon.  No further intervention needed in this regard unless patient develops new symptoms in the future."  Current Outpatient Medications  Medication Sig Dispense Refill  . Bismuth Subsalicylate 737 MG TABS Take 2 tablets (524 mg total) by mouth 4 (four) times daily. 108 each 0  . budesonide-formoterol (SYMBICORT) 160-4.5 MCG/ACT inhaler Inhale 2 puffs daily into the lungs.     . doxycycline (VIBRA-TABS) 100 MG tablet Take 1 tablet (100 mg total) by mouth 2 (two) times daily. 28 tablet 0  . feeding supplement, ENSURE ENLIVE, (ENSURE ENLIVE) LIQD Take 237 mLs by mouth 2 (two) times daily between meals. 60  Bottle 0  . ferrous sulfate 325 (65 FE) MG tablet Take 1 tablet (325 mg total) by mouth 2 (two) times daily with a meal. 60 tablet 0  . furosemide (LASIX) 20 MG tablet Take 20 mg daily by mouth.     . metoprolol succinate (TOPROL-XL) 50 MG 24 hr tablet Take 50 mg by mouth daily. Take with or immediately following a meal.    . metroNIDAZOLE (FLAGYL) 250 MG tablet Take 1 tablet (250 mg total) by mouth 4 (four) times daily. 56 tablet 0  . pantoprazole (PROTONIX) 40 MG tablet Take 1 tablet (40 mg total) by mouth 2 (two) times daily. 60 tablet 0  . Polyethylene Glycol 3350 (PEG 3350) POWD Take by mouth.    . potassium chloride (MICRO-K) 10 MEQ CR capsule Take 10 mEq by mouth daily.    Marland Kitchen rOPINIRole (REQUIP) 0.5 MG tablet Take 0.5 mg by mouth at bedtime.    . simvastatin (ZOCOR) 20 MG tablet Take 20 mg by mouth daily at 6 PM.    . SUMAtriptan (IMITREX) 6 MG/0.5ML SOLN injection Inject 6 mg every 2 (two) hours as needed into the skin for migraine or headache. May repeat in 2 hours if headache persists or recurs.    Marland Kitchen tiotropium (SPIRIVA) 18 MCG inhalation capsule Place 18 mcg into inhaler and inhale daily.    . vitamin B-12 1000 MCG tablet Take 1 tablet (1,000 mcg total) by mouth daily. 30 tablet 0   No current facility-administered medications for this visit.     Allergies as of 10/17/2017 - Review Complete 08/21/2017  Allergen Reaction Noted  . Latex Rash 02/03/2015    ROS:  General: Negative for anorexia, weight loss, fever, chills, fatigue, weakness. ENT: Negative for hoarseness, difficulty  swallowing , nasal congestion. CV: Negative for chest pain, angina, palpitations, dyspnea on exertion, peripheral edema.  Respiratory: Negative for dyspnea at rest, dyspnea on exertion, cough, sputum, wheezing.  GI: See history of present illness. GU:  Negative for dysuria, hematuria, urinary incontinence, urinary frequency, nocturnal urination.  Endo: Negative for unusual weight change.    Physical  Examination:   There were no vitals taken for this visit.  General: Well-nourished, well-developed in no acute distress.  Eyes: No icterus. Conjunctivae pink. Mouth: Oropharyngeal mucosa moist and pink , no lesions erythema or exudate. Neck: Supple, Trachea midline Abdomen: Bowel sounds are normal, nontender, nondistended, no hepatosplenomegaly or masses, no abdominal bruits or hernia , no rebound or guarding.   Extremities: No lower extremity edema. No clubbing or deformities. Neuro: Alert and oriented x 3.  Grossly intact. Skin: Warm and dry, no jaundice.   Psych: Alert and cooperative, normal mood and affect.   Labs: CMP     Component Value Date/Time   NA 140 08/07/2017 0411   K 3.4 (L) 08/07/2017 0411   CL 107 08/07/2017 0411   CO2 25 08/07/2017 0411   GLUCOSE 86 08/07/2017 0411   BUN 13 08/07/2017 0411   CREATININE 0.78 08/07/2017 0411   CALCIUM 8.5 (L) 08/07/2017 0411   PROT 6.0 (L) 08/06/2017 1805   ALBUMIN 3.5 08/06/2017 1805   AST 17 08/06/2017 1805   ALT 8 (L) 08/06/2017 1805   ALKPHOS 32 (L) 08/06/2017 1805   BILITOT 0.5 08/06/2017 1805   GFRNONAA >60 08/07/2017 0411   GFRAA >60 08/07/2017 0411   Lab Results  Component Value Date   WBC 7.4 08/21/2017   HGB 9.8 (L) 08/21/2017   HCT 31.5 (L) 08/21/2017   MCV 77.8 (L) 08/21/2017   PLT 601 (H) 08/21/2017    Imaging Studies: No results found.  Assessment and Plan:   JOURNIE HOWSON is a 67 y.o. y/o female here for follow-up of history of anemia, duodenal and gastric ulcers, AVM, dating back to November 2018, now hemoglobin stable on IV iron transfusions by hematology   EGD is indicated for follow-up of focal intestinal metaplasia seen on gastric biopsies I have discussed alternative options, risks & benefits,  which include, but are not limited to, bleeding, infection, perforation,respiratory complication & drug reaction.  The patient agrees with this plan & written consent will be obtained.    We will  obtain gastric mapping biopsies, and will also allow for evaluation of H. pylori eradication First triple therapy did not lead to eradication for H. pylori, as it was present on repeat biopsies in February 2019, and she is status post quadruple therapy after March 2019 biopsies  She is continuing to avoid NSAIDs Continue follow-up with hematology who is monitoring her iron and prescribing IV iron.  She can discuss with them if she needs to continue her oral iron in addition  Colonoscopy indicated in 5 years from February 2019 for polyp surveillance   Dr Vonda Antigua

## 2017-11-18 NOTE — Progress Notes (Signed)
Patient's hemoglobin and iron stores are now within normal limits. [place]  [phone] [phone]  ID: Hailey Farrell OB: 06/09/1950  MR#: [id number]  GDJ#:[id number]  Patient Care Team: Letta Median, MD as PCP - General (Family Medicine)  CHIEF COMPLAINT:  Iron deficiency anemia  INTERVAL HISTORY: Patient returns to clinic today for repeat laboratory work and further evaluation.  She feels significantly improved after receiving 2 infusions of IV Feraheme in March 2019.  She currently feels well and is asymptomatic.  She does not complain of weakness or fatigue today.  She has no neurologic complaints.  She denies any recent fevers or illnesses.  She has a good appetite and denies weight loss.  She has no chest pain or shortness of breath.  She denies any nausea, vomiting, constipation, or diarrhea.  She denies any melena or hematochezia.  She has no urinary complaints.  Patient offers no specific complaints today.  REVIEW OF SYSTEMS:   Review of Systems  Constitutional: Negative.  Negative for fever, malaise/fatigue and weight loss.  Respiratory: Negative.  Negative for cough, hemoptysis and shortness of breath.   Cardiovascular: Negative.  Negative for chest pain.  Gastrointestinal: Negative.  Negative for abdominal pain, blood in stool and melena.  Genitourinary: Negative.  Negative for hematuria.  Musculoskeletal: Negative.  Negative for back pain.  Skin: Negative.  Negative for rash.  Neurological: Negative.  Negative for sensory change, focal weakness and weakness.  Psychiatric/Behavioral: Negative.  The patient is not nervous/anxious.     As per HPI. Otherwise, a complete review of systems is negative.  PAST MEDICAL HISTORY: Past Medical History:  Diagnosis Date  . Breast cancer ([place])    Right  . COPD (chronic obstructive pulmonary disease) ([place])   . Elevated lipids   . Hypertension   . Lower extremity edema   . Migraines     . Migraines   . Restless leg syndrome   . Rotator cuff injury     PAST SURGICAL HISTORY: Past Surgical History:  Procedure Laterality Date  . ABDOMINAL HYSTERECTOMY    . BREAST BIOPSY Right   . COLONOSCOPY WITH PROPOFOL N/A 07/20/2017   Procedure: COLONOSCOPY WITH PROPOFOL;  Surgeon: Virgel Manifold, MD;  Location: [facility] ENDOSCOPY;  Service: Endoscopy;  Laterality: N/A;  . ENTEROSCOPY N/A 08/08/2017   Procedure: ENTEROSCOPY;  Surgeon: Lin Landsman, MD;  Location: [facility] ENDOSCOPY;  Service: Gastroenterology;  Laterality: N/A;  . ESOPHAGOGASTRODUODENOSCOPY Left 04/13/2017   Procedure: ESOPHAGOGASTRODUODENOSCOPY (EGD);  Surgeon: Virgel Manifold, MD;  Location: [facility] ENDOSCOPY;  Service: Endoscopy;  Laterality: Left;  . ESOPHAGOGASTRODUODENOSCOPY (EGD) WITH PROPOFOL N/A 07/20/2017   Procedure: ESOPHAGOGASTRODUODENOSCOPY (EGD) WITH PROPOFOL;  Surgeon: Virgel Manifold, MD;  Location: [facility] ENDOSCOPY;  Service: Endoscopy;  Laterality: N/A;  . JOINT REPLACEMENT     Lt knee  . MEDIAL PARTIAL KNEE REPLACEMENT    . PARTIAL HYSTERECTOMY    . SHOULDER ARTHROSCOPY WITH OPEN ROTATOR CUFF REPAIR Left 03/01/2016   Procedure: SHOULDER ARTHROSCOPY WITH OPEN ROTATOR CUFF REPAIR;  Surgeon: Thornton Park, MD;  Location: [facility] ORS;  Service: Orthopedics;  Laterality: Left;    FAMILY HISTORY: Family History  Problem Relation Age of Onset  . CAD Mother   . CAD Sister     ADVANCED DIRECTIVES (Y/N):  N  HEALTH MAINTENANCE: Social History   Tobacco Use  . Smoking status: Former Smoker    Packs/day: 0.10    Years: 50.00    Pack years: 5.00  Types: Cigarettes    Last attempt to quit: 03/13/2016    Years since quitting: 1.7  . Smokeless tobacco: Never Used  Substance Use Topics  . Alcohol use: No  . Drug use: No     Colonoscopy:  PAP:  Bone density:  Lipid panel:  Allergies  Allergen Reactions  . Latex Rash    Current Outpatient Medications  Medication Sig Dispense  Refill  . budesonide-formoterol (SYMBICORT) 160-4.5 MCG/ACT inhaler Inhale 2 puffs daily into the lungs.     . ferrous sulfate 325 (65 FE) MG tablet Take 1 tablet (325 mg total) by mouth 2 (two) times daily with a meal. 60 tablet 0  . furosemide (LASIX) 20 MG tablet Take 20 mg daily by mouth.     . metoprolol succinate (TOPROL-XL) 50 MG 24 hr tablet Take 50 mg by mouth daily. Take with or immediately following a meal.    . potassium chloride (MICRO-K) 10 MEQ CR capsule Take 10 mEq by mouth daily.    Marland Kitchen rOPINIRole (REQUIP) 0.5 MG tablet Take 0.5 mg by mouth at bedtime.    . simvastatin (ZOCOR) 20 MG tablet Take 20 mg by mouth daily at 6 PM.    . tiotropium (SPIRIVA) 18 MCG inhalation capsule Place 18 mcg into inhaler and inhale daily.    . vitamin B-12 1000 MCG tablet Take 1 tablet (1,000 mcg total) by mouth daily. 30 tablet 0  . Polyethylene Glycol 3350 (PEG 3350) POWD Take by mouth.    . SUMAtriptan (IMITREX) 6 MG/0.5ML SOLN injection Inject 6 mg every 2 (two) hours as needed into the skin for migraine or headache. May repeat in 2 hours if headache persists or recurs.     No current facility-administered medications for this visit.   From  OBJECTIVE: Vitals:   11/20/17 1334  BP: (!) 176/95  Pulse: 68  Resp: 18  Temp: (!) 96.5 F (35.8 C)     Body mass index is 33.25 kg/m.    ECOG FS:0 - Asymptomatic  General: Well-developed, well-nourished, no acute distress. Eyes: Pink conjunctiva, anicteric sclera. Lungs: Clear to auscultation bilaterally. Heart: Regular rate and rhythm. No rubs, murmurs, or gallops. Abdomen: Soft, nontender, nondistended. No organomegaly noted, normoactive bowel sounds. Musculoskeletal: No edema, cyanosis, or clubbing. Neuro: Alert, answering all questions appropriately. Cranial nerves grossly intact. Skin: No rashes or petechiae noted. Psych: Normal affect.  LAB RESULTS:  Lab Results  Component Value Date   NA 140 08/07/2017   K 3.4 (L) 08/07/2017   CL  107 08/07/2017   CO2 25 08/07/2017   GLUCOSE 86 08/07/2017   BUN 13 08/07/2017   CREATININE 0.78 08/07/2017   CALCIUM 8.5 (L) 08/07/2017   PROT 6.0 (L) 08/06/2017   ALBUMIN 3.5 08/06/2017   AST 17 08/06/2017   ALT 8 (L) 08/06/2017   ALKPHOS 32 (L) 08/06/2017   BILITOT 0.5 08/06/2017   GFRNONAA >60 08/07/2017   GFRAA >60 08/07/2017    Lab Results  Component Value Date   WBC 4.6 11/20/2017   NEUTROABS 2.5 11/20/2017   HGB 12.8 11/20/2017   HCT 38.7 11/20/2017   MCV 83.5 11/20/2017   PLT 230 11/20/2017   Lab Results  Component Value Date   IRON 89 11/20/2017   TIBC 264 11/20/2017   IRONPCTSAT 34 (H) 11/20/2017   Lab Results  Component Value Date   FERRITIN 29 11/20/2017     STUDIES: No results found.  ASSESSMENT: Iron deficiency anemia  PLAN:    1.  Iron deficiency anemia: Likely secondary to GI blood loss.  Upper endoscopy completed on July 20, 2017 revealed 2 nonbleeding duodenal angioectasia that were treated with argon laser.  Colonoscopy on the same date was essentially within normal limits except for 2 nonmalignant polyps.  Capsule endoscopy on August 08, 2017 revealed an additional nonbleeding angioectasia in the jejunum as well as a stomach that were also treated with argon laser.  Patient's hemoglobin and iron stores are now within normal limits.  She does not require additional IV Feraheme today.  Patient last received IV Feraheme on August 28, 2017.  Return to clinic in 3 months with repeat laboratory work and further evaluation.  I spent a total of 2 graduate 0 minutes face-to-face with the patient of which greater than 50% of the visit was spent in counseling and coordination of care as summarized above.  Patient expressed understanding and was in agreement with this plan. She also understands that She can call clinic at any time with any questions, concerns, or complaints.    Lloyd Huger, MD   11/25/2017 8:14 AM

## 2017-11-20 ENCOUNTER — Inpatient Hospital Stay (HOSPITAL_BASED_OUTPATIENT_CLINIC_OR_DEPARTMENT_OTHER): Payer: Medicare HMO | Admitting: Oncology

## 2017-11-20 ENCOUNTER — Inpatient Hospital Stay: Payer: Medicare HMO | Attending: Oncology

## 2017-11-20 ENCOUNTER — Inpatient Hospital Stay: Payer: Medicare HMO

## 2017-11-20 ENCOUNTER — Other Ambulatory Visit: Payer: Self-pay

## 2017-11-20 VITALS — BP 176/95 | HR 68 | Temp 96.5°F | Resp 18 | Wt 187.7 lb

## 2017-11-20 DIAGNOSIS — D509 Iron deficiency anemia, unspecified: Secondary | ICD-10-CM

## 2017-11-20 LAB — CBC WITH DIFFERENTIAL/PLATELET
Basophils Absolute: 0 10*3/uL (ref 0–0.1)
Basophils Relative: 1 %
EOS ABS: 0 10*3/uL (ref 0–0.7)
EOS PCT: 1 %
HCT: 38.7 % (ref 35.0–47.0)
HEMOGLOBIN: 12.8 g/dL (ref 12.0–16.0)
LYMPHS ABS: 1.7 10*3/uL (ref 1.0–3.6)
Lymphocytes Relative: 37 %
MCH: 27.5 pg (ref 26.0–34.0)
MCHC: 33 g/dL (ref 32.0–36.0)
MCV: 83.5 fL (ref 80.0–100.0)
MONO ABS: 0.3 10*3/uL (ref 0.2–0.9)
MONOS PCT: 7 %
Neutro Abs: 2.5 10*3/uL (ref 1.4–6.5)
Neutrophils Relative %: 54 %
PLATELETS: 230 10*3/uL (ref 150–440)
RBC: 4.63 MIL/uL (ref 3.80–5.20)
RDW: 14 % (ref 11.5–14.5)
WBC: 4.6 10*3/uL (ref 3.6–11.0)

## 2017-11-20 LAB — IRON AND TIBC
Iron: 89 ug/dL (ref 28–170)
Saturation Ratios: 34 % — ABNORMAL HIGH (ref 10.4–31.8)
TIBC: 264 ug/dL (ref 250–450)
UIBC: 175 ug/dL

## 2017-11-20 LAB — FERRITIN: FERRITIN: 29 ng/mL (ref 11–307)

## 2017-11-20 NOTE — Progress Notes (Signed)
Here for follow up. Per pt stated " energy has gotten better-there for a while I didn't even move-now Im better "

## 2017-12-12 ENCOUNTER — Encounter
Admission: RE | Disposition: A | Discharge status: Home / Self Care | Payer: Self-pay | Source: Ambulatory Visit | Attending: Gastroenterology

## 2017-12-12 ENCOUNTER — Ambulatory Visit
Admission: RE | Admit: 2017-12-12 | Discharge: 2017-12-12 | Disposition: A | Discharge status: Home / Self Care | Payer: Medicare HMO | Source: Ambulatory Visit | Attending: Gastroenterology | Admitting: Gastroenterology

## 2017-12-12 ENCOUNTER — Encounter: Payer: Self-pay | Admitting: *Deleted

## 2017-12-12 ENCOUNTER — Ambulatory Visit: Payer: Medicare HMO | Admitting: Registered Nurse

## 2017-12-12 DIAGNOSIS — K279 Peptic ulcer, site unspecified, unspecified as acute or chronic, without hemorrhage or perforation: Secondary | ICD-10-CM | POA: Diagnosis not present

## 2017-12-12 DIAGNOSIS — Z853 Personal history of malignant neoplasm of breast: Secondary | ICD-10-CM | POA: Diagnosis not present

## 2017-12-12 DIAGNOSIS — K449 Diaphragmatic hernia without obstruction or gangrene: Secondary | ICD-10-CM | POA: Diagnosis not present

## 2017-12-12 DIAGNOSIS — Z87891 Personal history of nicotine dependence: Secondary | ICD-10-CM | POA: Insufficient documentation

## 2017-12-12 DIAGNOSIS — Z79899 Other long term (current) drug therapy: Secondary | ICD-10-CM | POA: Diagnosis not present

## 2017-12-12 DIAGNOSIS — K2289 Other specified disease of esophagus: Secondary | ICD-10-CM

## 2017-12-12 DIAGNOSIS — K31A0 Gastric intestinal metaplasia, unspecified: Secondary | ICD-10-CM

## 2017-12-12 DIAGNOSIS — K228 Other specified diseases of esophagus: Secondary | ICD-10-CM | POA: Diagnosis not present

## 2017-12-12 DIAGNOSIS — J449 Chronic obstructive pulmonary disease, unspecified: Secondary | ICD-10-CM | POA: Diagnosis not present

## 2017-12-12 DIAGNOSIS — G2581 Restless legs syndrome: Secondary | ICD-10-CM | POA: Insufficient documentation

## 2017-12-12 DIAGNOSIS — B9681 Helicobacter pylori [H. pylori] as the cause of diseases classified elsewhere: Secondary | ICD-10-CM | POA: Diagnosis not present

## 2017-12-12 DIAGNOSIS — K3189 Other diseases of stomach and duodenum: Secondary | ICD-10-CM | POA: Diagnosis not present

## 2017-12-12 DIAGNOSIS — K2950 Unspecified chronic gastritis without bleeding: Secondary | ICD-10-CM | POA: Insufficient documentation

## 2017-12-12 DIAGNOSIS — I10 Essential (primary) hypertension: Secondary | ICD-10-CM | POA: Diagnosis not present

## 2017-12-12 HISTORY — PX: ESOPHAGOGASTRODUODENOSCOPY (EGD) WITH PROPOFOL: SHX5813

## 2017-12-12 SURGERY — ESOPHAGOGASTRODUODENOSCOPY (EGD) WITH PROPOFOL
Anesthesia: General

## 2017-12-12 MED ORDER — PROPOFOL 10 MG/ML IV BOLUS
INTRAVENOUS | Status: DC | PRN
  Administered 2017-12-12: 50 mg via INTRAVENOUS

## 2017-12-12 MED ORDER — MIDAZOLAM HCL 2 MG/2ML IJ SOLN
INTRAMUSCULAR | Status: DC | PRN
  Administered 2017-12-12: 2 mg via INTRAVENOUS

## 2017-12-12 MED ORDER — LIDOCAINE HCL (CARDIAC) PF 100 MG/5ML IV SOSY
PREFILLED_SYRINGE | INTRAVENOUS | Status: DC | PRN
  Administered 2017-12-12: 40 mg via INTRAVENOUS

## 2017-12-12 MED ORDER — PROPOFOL 500 MG/50ML IV EMUL
INTRAVENOUS | Status: AC
  Filled 2017-12-12: qty 50

## 2017-12-12 MED ORDER — MIDAZOLAM HCL 2 MG/2ML IJ SOLN
INTRAMUSCULAR | Status: AC
  Filled 2017-12-12: qty 2

## 2017-12-12 MED ORDER — SODIUM CHLORIDE 0.9 % IV SOLN
INTRAVENOUS | Status: DC
  Administered 2017-12-12: 1000 mL via INTRAVENOUS

## 2017-12-12 MED ORDER — LIDOCAINE HCL (PF) 2 % IJ SOLN
INTRAMUSCULAR | Status: AC
  Filled 2017-12-12: qty 10

## 2017-12-12 MED ORDER — SUCCINYLCHOLINE CHLORIDE 20 MG/ML IJ SOLN
INTRAMUSCULAR | Status: AC
  Filled 2017-12-12: qty 1

## 2017-12-12 MED ORDER — PROPOFOL 500 MG/50ML IV EMUL
INTRAVENOUS | Status: DC | PRN
  Administered 2017-12-12: 150 ug/kg/min via INTRAVENOUS

## 2017-12-12 MED ORDER — GLYCOPYRROLATE 0.2 MG/ML IJ SOLN
INTRAMUSCULAR | Status: DC | PRN
  Administered 2017-12-12: 0.2 mg via INTRAVENOUS

## 2017-12-12 MED ORDER — EPHEDRINE SULFATE 50 MG/ML IJ SOLN
INTRAMUSCULAR | Status: AC
  Filled 2017-12-12: qty 1

## 2017-12-12 MED ORDER — PHENYLEPHRINE HCL 10 MG/ML IJ SOLN
INTRAMUSCULAR | Status: AC
  Filled 2017-12-12: qty 1

## 2017-12-12 NOTE — H&P (Addendum)
Vonda Antigua, MD [address] [place], [place], [place], [address] [address], [place], [place], [place], [address] Phone: [phone]  Fax: [phone]  Primary Care Physician:  Letta Median, MD   Pre-Procedure History & Physical: HPI:  Hailey Farrell is a 67 y.o. female is here for an EGD.   Past Medical History:  Diagnosis Date  . Breast cancer ([place])    Right  . COPD (chronic obstructive pulmonary disease) ([place])   . Elevated lipids   . Hypertension   . Lower extremity edema   . Migraines   . Migraines   . Restless leg syndrome   . Rotator cuff injury     Past Surgical History:  Procedure Laterality Date  . ABDOMINAL HYSTERECTOMY    . BREAST BIOPSY Right   . COLONOSCOPY WITH PROPOFOL N/A 07/20/2017   Procedure: COLONOSCOPY WITH PROPOFOL;  Surgeon: Virgel Manifold, MD;  Location: [facility] ENDOSCOPY;  Service: Endoscopy;  Laterality: N/A;  . ENTEROSCOPY N/A 08/08/2017   Procedure: ENTEROSCOPY;  Surgeon: Lin Landsman, MD;  [facility] ENDOSCOPY;  Service: Gastroenterology;  Laterality: N/A;  . ESOPHAGOGASTRODUODENOSCOPY Left 04/13/2017   Procedure: ESOPHAGOGASTRODUODENOSCOPY (EGD);  Surgeon: Virgel Manifold, MD;  Location: [facility] ENDOSCOPY;  Service: Endoscopy;  Laterality: Left;  . ESOPHAGOGASTRODUODENOSCOPY (EGD) WITH PROPOFOL N/A 07/20/2017   Procedure: ESOPHAGOGASTRODUODENOSCOPY (EGD) WITH PROPOFOL;  Surgeon: Virgel Manifold, MD;  Location: [facility] ENDOSCOPY;  Service: Endoscopy;  Laterality: N/A;  . JOINT REPLACEMENT     Lt knee  . MEDIAL PARTIAL KNEE REPLACEMENT    . PARTIAL HYSTERECTOMY    . SHOULDER ARTHROSCOPY WITH OPEN ROTATOR CUFF REPAIR Left 03/01/2016   Procedure: SHOULDER ARTHROSCOPY WITH OPEN ROTATOR CUFF REPAIR;  Surgeon: Thornton Park, MD;  Location: [facility] ORS;  Service: Orthopedics;  Laterality: Left;    Prior to Admission medications   Medication Sig Start Date End Date Taking? Authorizing Provider    budesonide-formoterol (SYMBICORT) 160-4.5 MCG/ACT inhaler Inhale 2 puffs daily into the lungs.    Yes [provider]  ferrous sulfate 325 (65 FE) MG tablet Take 1 tablet (325 mg total) by mouth 2 (two) times daily with a meal. 08/08/17  Yes Wieting, Richard, MD  furosemide (LASIX) 20 MG tablet Take 20 mg daily by mouth.    Yes [provider]  metoprolol succinate (TOPROL-XL) 50 MG 24 hr tablet Take 50 mg by mouth daily. Take with or immediately following a meal.   Yes [provider]  potassium chloride (MICRO-K) 10 MEQ CR capsule Take 10 mEq by mouth daily.   Yes [provider]  simvastatin (ZOCOR) 20 MG tablet Take 20 mg by mouth daily at 6 PM.   Yes [provider]  SUMAtriptan (IMITREX) 6 MG/0.5ML SOLN injection Inject 6 mg every 2 (two) hours as needed into the skin for migraine or headache. May repeat in 2 hours if headache persists or recurs.   Yes [provider]  tiotropium (SPIRIVA) 18 MCG inhalation capsule Place 18 mcg into inhaler and inhale daily.   Yes [provider]  vitamin B-12 1000 MCG tablet Take 1 tablet (1,000 mcg total) by mouth daily. 08/09/17  Yes Loletha Grayer, MD  Polyethylene Glycol 3350 (PEG 3350) POWD Take by mouth. 10/02/16   [provider]  rOPINIRole (REQUIP) 0.5 MG tablet Take 0.5 mg by mouth at bedtime.    [provider]    Allergies as of 10/18/2017 - Review Complete 10/17/2017  Allergen Reaction Noted  . Latex  Rash 02/03/2015    Family History  Problem Relation Age of Onset  . CAD Mother   . CAD Sister     Social History   Socioeconomic History  . Marital status: Married    Spouse name: Not on file  . Number of children: Not on file  . Years of education: Not on file  . Highest education level: Not on file  Occupational History  . Not on file  Social Needs  . Financial resource strain: Not on file  . Food insecurity:    Worry: Not on file    Inability: Not  on file  . Transportation needs:    Medical: Not on file    Non-medical: Not on file  Tobacco Use  . Smoking status: Former Smoker    Packs/day: 0.10    Years: 50.00    Pack years: 5.00    Types: Cigarettes    Last attempt to quit: 03/13/2016    Years since quitting: 1.7  . Smokeless tobacco: Never Used  Substance and Sexual Activity  . Alcohol use: No  . Drug use: No  . Sexual activity: Not on file  Lifestyle  . Physical activity:    Days per week: Not on file    Minutes per session: Not on file  . Stress: Not on file  Relationships  . Social connections:    Talks on phone: Not on file    Gets together: Not on file    Attends religious service: Not on file    Active member of club or organization: Not on file    Attends meetings of clubs or organizations: Not on file    Relationship status: Not on file  . Intimate partner violence:    Fear of current or ex partner: Not on file    Emotionally abused: Not on file    Physically abused: Not on file    Forced sexual activity: Not on file  Other Topics Concern  . Not on file  Social History Narrative  . Not on file    Review of Systems: See HPI, otherwise negative ROS  Physical Exam: BP (!) 148/94   Pulse 72   Temp (!) 97.4 F (36.3 C) (Tympanic)   Resp 20   Ht 5\' 3"  (1.6 m)   Wt 184 lb (83.5 kg)   SpO2 99%   BMI 32.59 kg/m  General:   Alert,  pleasant and cooperative in NAD Head:  Normocephalic and atraumatic. Neck:  Supple; no masses or thyromegaly. Lungs:  Clear throughout to auscultation, normal respiratory effort.    Heart:  +S1, +S2, Regular rate and rhythm, No edema. Abdomen:  Soft, nontender and nondistended. Normal bowel sounds, without guarding, and without rebound.   Neurologic:  Alert and  oriented x4;  grossly normal neurologically.  Impression/Plan: Hailey Farrell is here for an EGD for history of focal intestinal metaplasia and H Pylori on gastric biopsies.   Risks, benefits, limitations, and  alternatives regarding the procedure have been reviewed with the patient.  Questions have been answered.  All parties agreeable.   Virgel Manifold, MD  12/12/2017, 8:07 AM

## 2017-12-12 NOTE — Anesthesia Postprocedure Evaluation (Signed)
Anesthesia Post Note  Patient: Hailey Farrell  Procedure(s) Performed: ESOPHAGOGASTRODUODENOSCOPY (EGD) WITH PROPOFOL (N/A )  Patient location during evaluation: PACU Anesthesia Type: General Level of consciousness: awake and alert Pain management: pain level controlled Vital Signs Assessment: post-procedure vital signs reviewed and stable Respiratory status: spontaneous breathing, nonlabored ventilation, respiratory function stable and patient connected to nasal cannula oxygen Cardiovascular status: blood pressure returned to baseline and stable Postop Assessment: no apparent nausea or vomiting Anesthetic complications: no     Last Vitals:  Vitals:   12/12/17 0715 12/12/17 0840  BP: (!) 148/94 129/83  Pulse: 72 100  Resp: 20 20  Temp: (!) 36.3 C (!) 36.1 C  SpO2: 99% 100%    Last Pain:  Vitals:   12/12/17 0840  TempSrc: Tympanic  PainSc:                  Durenda Hurt

## 2017-12-12 NOTE — Op Note (Signed)
[facility] Gastroenterology Patient Name: Hailey Farrell Procedure Date: 12/12/2017 7:46 AM MRN: [id number] Account #: [id number] Date of Birth: 1951/05/30 Admit Type: Outpatient Age: 67 Room: [facility] ENDO ROOM 2 Gender: Female Note Status: Finalized Procedure:            Upper GI endoscopy Indications:          Follow-up of peptic ulcer, Follow-up of Helicobacter                        pylori, Follow-up of intestinal metaplasia Providers:            Miciah Shealy B. Bonna Gains MD, MD Referring MD:         [facility]Barton Dubois (Referring MD) Medicines:            Monitored Anesthesia Care Complications:        No immediate complications. Procedure:            Pre-Anesthesia Assessment:                       - Prior to the procedure, a History and Physical was                        performed, and patient medications, allergies and                        sensitivities were reviewed. The patient's tolerance of                        previous anesthesia was reviewed.                       - The risks and benefits of the procedure and the                        sedation options and risks were discussed with the                        patient. All questions were answered and informed                        consent was obtained.                       - Patient identification and proposed procedure were                        verified prior to the procedure by the physician, the                        nurse, the anesthesiologist, the anesthetist and the                        technician. The procedure was verified in the procedure                        room.                       - ASA Grade Assessment: III - A patient with severe  systemic disease.                       After obtaining informed consent, the endoscope was                        passed under direct vision. Throughout the procedure,                        the patient's blood  pressure, pulse, and oxygen                        saturations were monitored continuously. The Endoscope                        was introduced through the mouth, and advanced to the                        third part of duodenum. The upper GI endoscopy was                        accomplished with ease. The patient tolerated the                        procedure well. Findings:      The Z-line was irregular and was found 39 cm from the incisors. Biopsies       were taken in 4 quadrants with a cold forceps for histology.      Patchy mildly erythematous mucosa without bleeding was found in the       gastric antrum. Biopsies were taken with a cold forceps for histology.       Biopsies were obtained on the greater curvature of the gastric body, on       the lesser curvature of the gastric body, at the incisura, on the       greater curvature of the gastric antrum and on the lesser curvature of       the gastric antrum with cold forceps for histology.      A few, 2 to 3 mm non-bleeding erosions were found in the stomach. There       were no stigmata of recent bleeding. Biopsies were taken with a cold       forceps for histology.      A small hiatal hernia was present.      Patchy mild mucosal changes characterized by nodularity were found in       the duodenal bulb. It likely represented pancreatic rest. The area has       been biopsied on previous EGDs and did not need to be biopsied again.      The second portion of the duodenum and examined duodenum were normal.      There is no endoscopic evidence of angioectasia in the entire examined       stomach.      There is no endoscopic evidence of angioectasia in the entire examined       duodenum. Impression:           - Z-line irregular, 39 cm from the incisors. Biopsied.                       - Erythematous mucosa in the antrum. Biopsied.                       -  Non-bleeding erosive gastropathy. Biopsied.                       - Small hiatal  hernia.                       - Mucosal changes in the duodenum.                       - Normal second portion of the duodenum and examined                        duodenum.                       - Biopsies were obtained on the greater curvature of                        the gastric body, on the lesser curvature of the                        gastric body, at the incisura, on the greater curvature                        of the gastric antrum and on the lesser curvature of                        the gastric antrum. Recommendation:       - Await pathology results.                       - Discharge patient to home (with escort).                       - Advance diet as tolerated.                       - Continue present medications.                       - Patient has a contact number available for                        emergencies. The signs and symptoms of potential                        delayed complications were discussed with the patient.                        Return to normal activities tomorrow. Written discharge                        instructions were provided to the patient.                       - Discharge patient to home (with escort).                       - The findings and recommendations were discussed with                        the patient.                       -  The findings and recommendations were discussed with                        the patient's family. Procedure Code(s):    --- Professional ---                       [id number], Esophagogastroduodenoscopy, flexible, transoral;                        with biopsy, single or multiple Diagnosis Code(s):    --- Professional ---                       K22.8, Other specified diseases of esophagus                       K31.89, Other diseases of stomach and duodenum                       K44.9, Diaphragmatic hernia without obstruction or                        gangrene                       K27.9, Peptic ulcer, site  unspecified, unspecified as                        acute or chronic, without hemorrhage or perforation                       M57.84, Helicobacter pylori [H. pylori] as the cause of                        diseases classified elsewhere CPT copyright 2017 American [employer]. All rights reserved. The codes documented in this report are preliminary and upon coder review may  be revised to meet current compliance requirements.  Vonda Antigua, MD Margretta Sidle B. Bonna Gains MD, MD 12/12/2017 8:45:38 AM This report has been signed electronically. Number of Addenda: 0 Note Initiated On: 12/12/2017 7:46 AM Estimated Blood Loss: Estimated blood loss: none.      [facility]

## 2017-12-12 NOTE — Anesthesia Preprocedure Evaluation (Addendum)
Anesthesia Evaluation  Patient identified by MRN, date of birth, ID band Patient awake    Reviewed: Allergy & Precautions, H&P , NPO status , Patient's Chart, lab work & pertinent test results  History of Anesthesia Complications Negative for: history of anesthetic complications  Airway Mallampati: II  TM Distance: >3 FB Neck ROM: full    Dental no notable dental hx. (+) Dental Advidsory Given   Pulmonary neg pulmonary ROS, COPD,  COPD inhaler, former smoker,    Pulmonary exam normal        Cardiovascular Exercise Tolerance: Good hypertension, negative cardio ROS Normal cardiovascular exam(-) dysrhythmias      Neuro/Psych  Headaches, negative psych ROS   GI/Hepatic negative GI ROS, Neg liver ROS, PUD,   Endo/Other  negative endocrine ROS  Renal/GU negative Renal ROS  negative genitourinary   Musculoskeletal   Abdominal   Peds  Hematology   Anesthesia Other Findings Past Medical History: No date: Breast cancer ([place])     Comment:  Right No date: COPD (chronic obstructive pulmonary disease) (HCC) No date: Elevated lipids No date: Hypertension No date: Lower extremity edema No date: Migraines No date: Migraines No date: Restless leg syndrome No date: Rotator cuff injury  Past Surgical History: No date: ABDOMINAL HYSTERECTOMY No date: BREAST BIOPSY; Right 07/20/2017: COLONOSCOPY WITH PROPOFOL; N/A     Comment:  Procedure: COLONOSCOPY WITH PROPOFOL;  Surgeon:               Virgel Manifold, MD;  Location: [facility] ENDOSCOPY;                Service: Endoscopy;  Laterality: N/A; 08/08/2017: ENTEROSCOPY; N/A     Comment:  Procedure: ENTEROSCOPY;  Surgeon: Lin Landsman,               MD;  Location: [facility] ENDOSCOPY;  Service:               Gastroenterology;  Laterality: N/A; 04/13/2017: ESOPHAGOGASTRODUODENOSCOPY; Left     Comment:  Procedure: ESOPHAGOGASTRODUODENOSCOPY (EGD);  Surgeon:   Virgel Manifold, MD;  Location: [facility] ENDOSCOPY;                Service: Endoscopy;  Laterality: Left; 07/20/2017: ESOPHAGOGASTRODUODENOSCOPY (EGD) WITH PROPOFOL; N/A     Comment:  Procedure: ESOPHAGOGASTRODUODENOSCOPY (EGD) WITH               PROPOFOL;  Surgeon: Virgel Manifold, MD;  Location:               [facility] ENDOSCOPY;  Service: Endoscopy;  Laterality: N/A; No date: JOINT REPLACEMENT     Comment:  Lt knee No date: MEDIAL PARTIAL KNEE REPLACEMENT No date: PARTIAL HYSTERECTOMY 03/01/2016: SHOULDER ARTHROSCOPY WITH OPEN ROTATOR CUFF REPAIR; Left     Comment:  Procedure: SHOULDER ARTHROSCOPY WITH OPEN ROTATOR CUFF               REPAIR;  Surgeon: Thornton Park, MD;  Location: [facility]               ORS;  Service: Orthopedics;  Laterality: Left;  BMI    Body Mass Index:  32.59 kg/m     Reproductive/Obstetrics negative OB ROS                            Anesthesia Physical Anesthesia Plan  ASA: III  Anesthesia Plan: General   Post-op Pain Management:    Induction:   PONV Risk Score  and Plan:   Airway Management Planned:   Additional Equipment:   Intra-op Plan:   Post-operative Plan:   Informed Consent:   Dental Advisory Given  Plan Discussed with: Anesthesiologist and CRNA  Anesthesia Plan Comments:        Anesthesia Quick Evaluation

## 2017-12-12 NOTE — Transfer of Care (Signed)
Immediate Anesthesia Transfer of Care Note  Patient: Hailey Farrell  Procedure(s) Performed: Procedure(s): ESOPHAGOGASTRODUODENOSCOPY (EGD) WITH PROPOFOL (N/A)  Patient Location: PACU  Anesthesia Type:General  Level of Consciousness: sedated  Airway & Oxygen Therapy: Patient Spontanous Breathing and Patient connected to face mask oxygen  Post-op Assessment: Report given to RN and Post -op Vital signs reviewed and stable  Post vital signs: Reviewed and stable  Last Vitals:  Vitals:   12/12/17 0715 12/12/17 0840  BP: (!) 148/94 129/83  Pulse: 72 100  Resp: 20 20  Temp: (!) 36.3 C (!) 36.1 C  SpO2: 22% 449%    Complications: No apparent anesthesia complications

## 2017-12-12 NOTE — Anesthesia Post-op Follow-up Note (Signed)
Anesthesia QCDR form completed.        

## 2017-12-14 LAB — SURGICAL PATHOLOGY

## 2017-12-16 ENCOUNTER — Encounter: Payer: Self-pay | Admitting: Gastroenterology

## 2017-12-24 ENCOUNTER — Other Ambulatory Visit: Payer: Self-pay | Admitting: Gastroenterology

## 2017-12-24 MED ORDER — OMEPRAZOLE 20 MG PO CPDR: 20.0000 mg | DELAYED_RELEASE_CAPSULE | Freq: Two times a day (BID) | ORAL | 0 refills | Status: DC

## 2017-12-24 MED ORDER — AMOXICILLIN 500 MG PO TABS: 1000.0000 mg | ORAL_TABLET | Freq: Two times a day (BID) | ORAL | 0 refills | Status: AC

## 2017-12-24 MED ORDER — LEVOFLOXACIN 500 MG PO TABS: 500.0000 mg | ORAL_TABLET | Freq: Every day | ORAL | 0 refills | Status: AC

## 2018-01-08 ENCOUNTER — Other Ambulatory Visit: Payer: Self-pay | Admitting: Orthopedic Surgery

## 2018-01-31 ENCOUNTER — Ambulatory Visit
Admission: RE | Admit: 2018-01-31 | Discharge: 2018-01-31 | Disposition: A | Discharge status: Home / Self Care | Payer: Medicare HMO | Source: Ambulatory Visit | Attending: Orthopedic Surgery | Admitting: Orthopedic Surgery

## 2018-01-31 ENCOUNTER — Encounter
Admission: RE | Admit: 2018-01-31 | Discharge: 2018-01-31 | Disposition: A | Discharge status: Home / Self Care | Payer: Medicare HMO | Source: Ambulatory Visit | Attending: Orthopedic Surgery | Admitting: Orthopedic Surgery

## 2018-01-31 ENCOUNTER — Other Ambulatory Visit: Payer: Self-pay

## 2018-01-31 DIAGNOSIS — Z01811 Encounter for preprocedural respiratory examination: Secondary | ICD-10-CM | POA: Diagnosis not present

## 2018-01-31 DIAGNOSIS — Z01812 Encounter for preprocedural laboratory examination: Secondary | ICD-10-CM | POA: Insufficient documentation

## 2018-01-31 DIAGNOSIS — Z01810 Encounter for preprocedural cardiovascular examination: Secondary | ICD-10-CM | POA: Insufficient documentation

## 2018-01-31 HISTORY — DX: Anemia, unspecified: D64.9

## 2018-01-31 HISTORY — DX: Unspecified osteoarthritis, unspecified site: M19.90

## 2018-01-31 HISTORY — DX: Dyspnea, unspecified: R06.00

## 2018-01-31 LAB — URINALYSIS, COMPLETE (UACMP) WITH MICROSCOPIC
Bilirubin Urine: NEGATIVE
Glucose, UA: NEGATIVE mg/dL
Hgb urine dipstick: NEGATIVE
Ketones, ur: NEGATIVE mg/dL
Nitrite: NEGATIVE
PH: 5 (ref 5.0–8.0)
Protein, ur: NEGATIVE mg/dL
SPECIFIC GRAVITY, URINE: 1.02 (ref 1.005–1.030)

## 2018-01-31 LAB — BASIC METABOLIC PANEL
ANION GAP: 9 (ref 5–15)
BUN: 21 mg/dL (ref 8–23)
CHLORIDE: 109 mmol/L (ref 98–111)
CO2: 24 mmol/L (ref 22–32)
CREATININE: 0.77 mg/dL (ref 0.44–1.00)
Calcium: 9.7 mg/dL (ref 8.9–10.3)
GFR calc non Af Amer: >60 mL/min (ref >60)
Glucose, Bld: 89 mg/dL (ref 70–99)
POTASSIUM: 3.9 mmol/L (ref 3.5–5.1)
SODIUM: 142 mmol/L (ref 135–145)

## 2018-01-31 LAB — CBC WITH DIFFERENTIAL/PLATELET
Basophils Absolute: 0 10*3/uL (ref 0–0.1)
Basophils Relative: 1 %
EOS ABS: 0 10*3/uL (ref 0–0.7)
Eosinophils Relative: 1 %
HEMATOCRIT: 41.5 % (ref 35.0–47.0)
HEMOGLOBIN: 13.8 g/dL (ref 12.0–16.0)
LYMPHS ABS: 1.7 10*3/uL (ref 1.0–3.6)
LYMPHS PCT: 37 %
MCH: 27.5 pg (ref 26.0–34.0)
MCHC: 33.2 g/dL (ref 32.0–36.0)
MCV: 82.9 fL (ref 80.0–100.0)
MONOS PCT: 8 %
Monocytes Absolute: 0.4 10*3/uL (ref 0.2–0.9)
NEUTROS ABS: 2.5 10*3/uL (ref 1.4–6.5)
NEUTROS PCT: 53 %
Platelets: 251 10*3/uL (ref 150–440)
RBC: 5.01 MIL/uL (ref 3.80–5.20)
RDW: 16 % — ABNORMAL HIGH (ref 11.5–14.5)
WBC: 4.6 10*3/uL (ref 3.6–11.0)

## 2018-01-31 LAB — HEMOGLOBIN A1C
HEMOGLOBIN A1C: 5.3 % (ref 4.8–5.6)
Mean Plasma Glucose: 105.41 mg/dL

## 2018-01-31 LAB — PROTIME-INR
INR: 0.95
Prothrombin Time: 12.6 seconds (ref 11.4–15.2)

## 2018-01-31 LAB — TYPE AND SCREEN
ABO/RH(D): O POS
ANTIBODY SCREEN: NEGATIVE

## 2018-01-31 LAB — SURGICAL PCR SCREEN
MRSA, PCR: NEGATIVE
Staphylococcus aureus: NEGATIVE

## 2018-01-31 LAB — APTT: aPTT: 33 seconds (ref 24–36)

## 2018-01-31 NOTE — Patient Instructions (Signed)
Your procedure is scheduled on: 02/14/18 Report to Day Surgery. Medical mall second floor To find out your arrival time please call [phone] between 1PM - 3PM on 02/13/18  Remember: Instructions that are not followed completely may result in serious medical risk,  up to and including death, or upon the discretion of your surgeon and anesthesiologist your  surgery may need to be rescheduled.     _X__ 1. Do not eat food after midnight the night before your procedure.                 No gum chewing or hard candies. You may drink clear liquids up to 2 hours                 before you are scheduled to arrive for your surgery- DO not drink clear                 liquids within 2 hours of the start of your surgery.                 Clear Liquids include:  water, apple juice without pulp, clear carbohydrate                 drink such as Clearfast of Gatorade, Black Coffee or Tea (Do not add                 anything to coffee or tea).  __X__2.  On the morning of surgery brush your teeth with toothpaste and water, you                may rinse your mouth with mouthwash if you wish.  Do not swallow any toothpaste of mouthwash.     _X__ 3.  No Alcohol for 24 hours before or after surgery.   _X__ 4.  Do Not Smoke or use e-cigarettes For 24 Hours Prior to Your Surgery.                 Do not use any chewable tobacco products for at least 6 hours prior to                 surgery.  ____  5.  Bring all medications with you on the day of surgery if instructed.   __x__  6.  Notify your doctor if there is any change in your medical condition      (cold, fever, infections).     Do not wear jewelry, make-up, hairpins, clips or nail polish. Do not wear lotions, powders, or perfumes. You may wear deodorant. Do not shave 48 hours prior to surgery. Men may shave face and neck. Do not bring valuables to the hospital.    [facility] is not responsible for any belongings or  valuables.  Contacts, dentures or bridgework may not be worn into surgery. Leave your suitcase in the car. After surgery it may be brought to your room. For patients admitted to the hospital, discharge time is determined by your treatment team.   Patients discharged the day of surgery will not be allowed to drive home.   Please read over the following fact sheets that you were given:   MRSA Information/ surgical site infection prevention   X__ Take these medicines the morning of surgery with A SIP OF WATER:    1.METOPROLOL  2.   3.   4.  5.  6.  ____ Fleet Enema (as directed)   __X__ Use CHG Soap as directed  __X__ Use inhalers on the day of surgery   [place]  ____ Stop metformin 2 days prior to surgery    ____ Take 1/2 of usual insulin dose the night before surgery. No insulin the morning          of surgery.   ____ Stop Coumadin/Plavix/aspirin on   ____ Stop Anti-inflammatories on   ____ Stop supplements until after surgery.    ____ Bring C-Pap to the hospital.

## 2018-02-12 NOTE — Pre-Procedure Instructions (Signed)
CLEARED LOW RISK ON CHART BY PCP

## 2018-02-13 MED ORDER — CLINDAMYCIN PHOSPHATE 600 MG/50ML IV SOLN
600.0000 mg | Freq: Once | INTRAVENOUS | Status: AC
  Administered 2018-02-14: 600 mg via INTRAVENOUS

## 2018-02-13 MED ORDER — CEFAZOLIN SODIUM-DEXTROSE 2-4 GM/100ML-% IV SOLN
2.0000 g | INTRAVENOUS | Status: AC
  Administered 2018-02-14: 2 g via INTRAVENOUS

## 2018-02-14 ENCOUNTER — Encounter
Admission: RE | Disposition: A | Discharge status: Home Health | Payer: Self-pay | Source: Ambulatory Visit | Attending: Orthopedic Surgery

## 2018-02-14 ENCOUNTER — Other Ambulatory Visit: Payer: Self-pay

## 2018-02-14 ENCOUNTER — Ambulatory Visit: Payer: Medicare HMO | Admitting: Certified Registered Nurse Anesthetist

## 2018-02-14 ENCOUNTER — Encounter: Payer: Self-pay | Admitting: *Deleted

## 2018-02-14 ENCOUNTER — Inpatient Hospital Stay: Payer: Medicare HMO

## 2018-02-14 ENCOUNTER — Observation Stay
Admission: RE | Admit: 2018-02-14 | Discharge: 2018-02-17 | Disposition: A | Discharge status: Home Health | Payer: Medicare HMO | Source: Ambulatory Visit | Attending: Orthopedic Surgery | Admitting: Orthopedic Surgery

## 2018-02-14 DIAGNOSIS — Z79899 Other long term (current) drug therapy: Secondary | ICD-10-CM | POA: Diagnosis not present

## 2018-02-14 DIAGNOSIS — J449 Chronic obstructive pulmonary disease, unspecified: Secondary | ICD-10-CM | POA: Diagnosis not present

## 2018-02-14 DIAGNOSIS — I959 Hypotension, unspecified: Secondary | ICD-10-CM | POA: Insufficient documentation

## 2018-02-14 DIAGNOSIS — M1712 Unilateral primary osteoarthritis, left knee: Principal | ICD-10-CM | POA: Insufficient documentation

## 2018-02-14 DIAGNOSIS — G2581 Restless legs syndrome: Secondary | ICD-10-CM | POA: Diagnosis not present

## 2018-02-14 DIAGNOSIS — I10 Essential (primary) hypertension: Secondary | ICD-10-CM | POA: Insufficient documentation

## 2018-02-14 DIAGNOSIS — Z853 Personal history of malignant neoplasm of breast: Secondary | ICD-10-CM | POA: Diagnosis not present

## 2018-02-14 DIAGNOSIS — Z96659 Presence of unspecified artificial knee joint: Secondary | ICD-10-CM

## 2018-02-14 DIAGNOSIS — Z87891 Personal history of nicotine dependence: Secondary | ICD-10-CM | POA: Insufficient documentation

## 2018-02-14 DIAGNOSIS — Z96652 Presence of left artificial knee joint: Secondary | ICD-10-CM

## 2018-02-14 DIAGNOSIS — R509 Fever, unspecified: Secondary | ICD-10-CM

## 2018-02-14 HISTORY — PX: TOTAL KNEE ARTHROPLASTY: SHX125

## 2018-02-14 SURGERY — ARTHROPLASTY, KNEE, TOTAL
Anesthesia: General | Site: Knee | Laterality: Left

## 2018-02-14 MED ORDER — PHENYLEPHRINE HCL 10 MG/ML IJ SOLN
INTRAMUSCULAR | Status: DC | PRN
  Administered 2018-02-14 (×8): 100 ug via INTRAVENOUS

## 2018-02-14 MED ORDER — ACETAMINOPHEN 500 MG PO TABS
1000.0000 mg | ORAL_TABLET | Freq: Four times a day (QID) | ORAL | Status: AC
  Administered 2018-02-14 (×3): 1000 mg via ORAL
  Filled 2018-02-14 (×3): qty 2

## 2018-02-14 MED ORDER — FUROSEMIDE 20 MG PO TABS
20.0000 mg | ORAL_TABLET | Freq: Every day | ORAL | Status: DC
  Administered 2018-02-15: 20 mg via ORAL
  Filled 2018-02-14 (×3): qty 1

## 2018-02-14 MED ORDER — BUPIVACAINE LIPOSOME 1.3 % IJ SUSP
INTRAMUSCULAR | Status: AC
  Filled 2018-02-14: qty 20

## 2018-02-14 MED ORDER — METOCLOPRAMIDE HCL 5 MG/ML IJ SOLN: 5.0000 mg | Freq: Three times a day (TID) | INTRAMUSCULAR | Status: DC | PRN

## 2018-02-14 MED ORDER — HYDROMORPHONE HCL 1 MG/ML IJ SOLN: 0.5000 mg | INTRAMUSCULAR | Status: DC | PRN

## 2018-02-14 MED ORDER — LISINOPRIL 20 MG PO TABS
20.0000 mg | ORAL_TABLET | Freq: Every day | ORAL | Status: DC
  Filled 2018-02-14 (×2): qty 1

## 2018-02-14 MED ORDER — METOPROLOL SUCCINATE ER 50 MG PO TB24
50.0000 mg | ORAL_TABLET | Freq: Every day | ORAL | Status: DC
  Administered 2018-02-17: 50 mg via ORAL
  Filled 2018-02-14 (×3): qty 1

## 2018-02-14 MED ORDER — SUMATRIPTAN SUCCINATE 6 MG/0.5ML ~~LOC~~ SOLN
6.0000 mg | SUBCUTANEOUS | Status: DC | PRN
  Filled 2018-02-14: qty 0.5

## 2018-02-14 MED ORDER — PHENOL 1.4 % MT LIQD
1.0000 | OROMUCOSAL | Status: DC | PRN
  Filled 2018-02-14: qty 177

## 2018-02-14 MED ORDER — OXYCODONE HCL 5 MG PO TABS
5.0000 mg | ORAL_TABLET | ORAL | Status: DC | PRN
  Administered 2018-02-14 – 2018-02-17 (×5): 5 mg via ORAL
  Filled 2018-02-14: qty 1
  Filled 2018-02-14: qty 2
  Filled 2018-02-14 (×3): qty 1
  Filled 2018-02-14: qty 2

## 2018-02-14 MED ORDER — METHOCARBAMOL 500 MG PO TABS
500.0000 mg | ORAL_TABLET | Freq: Four times a day (QID) | ORAL | Status: DC | PRN
  Administered 2018-02-14 – 2018-02-15 (×3): 500 mg via ORAL
  Filled 2018-02-14 (×3): qty 1

## 2018-02-14 MED ORDER — MOMETASONE FURO-FORMOTEROL FUM 200-5 MCG/ACT IN AERO
2.0000 | INHALATION_SPRAY | Freq: Two times a day (BID) | RESPIRATORY_TRACT | Status: DC
  Administered 2018-02-14 – 2018-02-17 (×7): 2 via RESPIRATORY_TRACT
  Filled 2018-02-14: qty 8.8

## 2018-02-14 MED ORDER — BUPIVACAINE-EPINEPHRINE (PF) 0.25% -1:200000 IJ SOLN
INTRAMUSCULAR | Status: AC
  Filled 2018-02-14: qty 60

## 2018-02-14 MED ORDER — SUGAMMADEX SODIUM 200 MG/2ML IV SOLN
INTRAVENOUS | Status: AC
  Filled 2018-02-14: qty 2

## 2018-02-14 MED ORDER — SODIUM CHLORIDE FLUSH 0.9 % IV SOLN
INTRAVENOUS | Status: AC
  Filled 2018-02-14: qty 40

## 2018-02-14 MED ORDER — ROCURONIUM BROMIDE 100 MG/10ML IV SOLN
INTRAVENOUS | Status: DC | PRN
  Administered 2018-02-14: 50 mg via INTRAVENOUS

## 2018-02-14 MED ORDER — OXYCODONE HCL 5 MG PO TABS
10.0000 mg | ORAL_TABLET | ORAL | Status: DC | PRN
  Administered 2018-02-14: 10 mg via ORAL
  Filled 2018-02-14: qty 2

## 2018-02-14 MED ORDER — MIDAZOLAM HCL 2 MG/2ML IJ SOLN
INTRAMUSCULAR | Status: DC | PRN
  Administered 2018-02-14: 2 mg via INTRAVENOUS

## 2018-02-14 MED ORDER — FENTANYL CITRATE (PF) 100 MCG/2ML IJ SOLN
25.0000 ug | INTRAMUSCULAR | Status: DC | PRN
  Administered 2018-02-14: 25 ug via INTRAVENOUS
  Administered 2018-02-14: 50 ug via INTRAVENOUS

## 2018-02-14 MED ORDER — ONDANSETRON HCL 4 MG/2ML IJ SOLN
INTRAMUSCULAR | Status: DC | PRN
  Administered 2018-02-14: 4 mg via INTRAVENOUS

## 2018-02-14 MED ORDER — ACETAMINOPHEN 10 MG/ML IV SOLN
INTRAVENOUS | Status: AC
  Filled 2018-02-14: qty 100

## 2018-02-14 MED ORDER — OXYCODONE HCL 5 MG/5ML PO SOLN: 5.0000 mg | Freq: Once | ORAL | Status: DC | PRN

## 2018-02-14 MED ORDER — METHOCARBAMOL 1000 MG/10ML IJ SOLN
500.0000 mg | Freq: Four times a day (QID) | INTRAVENOUS | Status: DC | PRN
  Filled 2018-02-14: qty 5

## 2018-02-14 MED ORDER — SUGAMMADEX SODIUM 200 MG/2ML IV SOLN
INTRAVENOUS | Status: DC | PRN
  Administered 2018-02-14: 180 mg via INTRAVENOUS

## 2018-02-14 MED ORDER — PROPOFOL 10 MG/ML IV BOLUS
INTRAVENOUS | Status: AC
  Filled 2018-02-14: qty 20

## 2018-02-14 MED ORDER — HYDROMORPHONE HCL 1 MG/ML IJ SOLN
INTRAMUSCULAR | Status: AC
  Filled 2018-02-14: qty 1

## 2018-02-14 MED ORDER — POTASSIUM CHLORIDE CRYS ER 10 MEQ PO TBCR
10.0000 meq | EXTENDED_RELEASE_TABLET | Freq: Every day | ORAL | Status: DC
  Administered 2018-02-14 – 2018-02-17 (×4): 10 meq via ORAL
  Filled 2018-02-14 (×4): qty 1

## 2018-02-14 MED ORDER — HYDROMORPHONE HCL 1 MG/ML IJ SOLN
INTRAMUSCULAR | Status: DC | PRN
  Administered 2018-02-14 (×2): 0.5 mg via INTRAVENOUS

## 2018-02-14 MED ORDER — PROPOFOL 500 MG/50ML IV EMUL
INTRAVENOUS | Status: AC
  Filled 2018-02-14: qty 50

## 2018-02-14 MED ORDER — FENTANYL CITRATE (PF) 100 MCG/2ML IJ SOLN
INTRAMUSCULAR | Status: DC | PRN
  Administered 2018-02-14: 100 ug via INTRAVENOUS

## 2018-02-14 MED ORDER — CHLORHEXIDINE GLUCONATE CLOTH 2 % EX PADS: 6.0000 | MEDICATED_PAD | Freq: Once | CUTANEOUS | Status: DC

## 2018-02-14 MED ORDER — LACTATED RINGERS IV SOLN
INTRAVENOUS | Status: DC
  Administered 2018-02-14: 75 mL/h via INTRAVENOUS
  Administered 2018-02-14: 12:00:00 via INTRAVENOUS

## 2018-02-14 MED ORDER — VITAMIN B-12 1000 MCG PO TABS
1000.0000 ug | ORAL_TABLET | Freq: Every day | ORAL | Status: DC
  Administered 2018-02-14 – 2018-02-17 (×4): 1000 ug via ORAL
  Filled 2018-02-14 (×4): qty 1

## 2018-02-14 MED ORDER — ONDANSETRON HCL 4 MG/2ML IJ SOLN
INTRAMUSCULAR | Status: AC
  Filled 2018-02-14: qty 2

## 2018-02-14 MED ORDER — SODIUM CHLORIDE 0.9 % IV SOLN
INTRAVENOUS | Status: DC | PRN
  Administered 2018-02-14: 60 mL

## 2018-02-14 MED ORDER — ROPINIROLE HCL 1 MG PO TABS
0.5000 mg | ORAL_TABLET | Freq: Every day | ORAL | Status: DC
  Administered 2018-02-14: 0.5 mg via ORAL
  Filled 2018-02-14: qty 1

## 2018-02-14 MED ORDER — FAMOTIDINE 20 MG PO TABS
ORAL_TABLET | ORAL | Status: AC
  Administered 2018-02-14: 20 mg via ORAL
  Filled 2018-02-14: qty 1

## 2018-02-14 MED ORDER — TIOTROPIUM BROMIDE MONOHYDRATE 18 MCG IN CAPS
18.0000 ug | ORAL_CAPSULE | Freq: Every day | RESPIRATORY_TRACT | Status: DC
  Administered 2018-02-14 – 2018-02-16 (×3): 18 ug via RESPIRATORY_TRACT
  Filled 2018-02-14: qty 5

## 2018-02-14 MED ORDER — METOCLOPRAMIDE HCL 10 MG PO TABS: 5.0000 mg | ORAL_TABLET | Freq: Three times a day (TID) | ORAL | Status: DC | PRN

## 2018-02-14 MED ORDER — NEOMYCIN-POLYMYXIN B GU 40-200000 IR SOLN
Status: AC
  Filled 2018-02-14: qty 20

## 2018-02-14 MED ORDER — LIDOCAINE HCL (CARDIAC) PF 100 MG/5ML IV SOSY
PREFILLED_SYRINGE | INTRAVENOUS | Status: DC | PRN
  Administered 2018-02-14: 100 mg via INTRAVENOUS

## 2018-02-14 MED ORDER — LIDOCAINE HCL (PF) 2 % IJ SOLN
INTRAMUSCULAR | Status: AC
  Filled 2018-02-14: qty 10

## 2018-02-14 MED ORDER — BISACODYL 5 MG PO TBEC
10.0000 mg | DELAYED_RELEASE_TABLET | Freq: Every day | ORAL | Status: DC | PRN
  Administered 2018-02-14 – 2018-02-16 (×2): 10 mg via ORAL
  Filled 2018-02-14 (×2): qty 2

## 2018-02-14 MED ORDER — POLYETHYLENE GLYCOL 3350 17 G PO PACK
17.0000 g | PACK | Freq: Every day | ORAL | Status: DC | PRN
  Administered 2018-02-16: 17 g via ORAL
  Filled 2018-02-14: qty 1

## 2018-02-14 MED ORDER — LABETALOL HCL 5 MG/ML IV SOLN
INTRAVENOUS | Status: DC | PRN
  Administered 2018-02-14 (×2): 5 mg via INTRAVENOUS

## 2018-02-14 MED ORDER — ENOXAPARIN SODIUM 40 MG/0.4ML ~~LOC~~ SOLN
40.0000 mg | SUBCUTANEOUS | Status: DC
  Administered 2018-02-15 – 2018-02-17 (×3): 40 mg via SUBCUTANEOUS
  Filled 2018-02-14 (×3): qty 0.4

## 2018-02-14 MED ORDER — CHLORHEXIDINE GLUCONATE CLOTH 2 % EX PADS
6.0000 | MEDICATED_PAD | Freq: Once | CUTANEOUS | Status: AC
  Administered 2018-02-14: 6 via TOPICAL

## 2018-02-14 MED ORDER — EPHEDRINE SULFATE 50 MG/ML IJ SOLN
INTRAMUSCULAR | Status: AC
  Filled 2018-02-14: qty 1

## 2018-02-14 MED ORDER — MAGNESIUM CITRATE PO SOLN
1.0000 | Freq: Once | ORAL | Status: DC | PRN
  Filled 2018-02-14: qty 296

## 2018-02-14 MED ORDER — PHENYLEPHRINE HCL 10 MG/ML IJ SOLN
INTRAMUSCULAR | Status: AC
  Filled 2018-02-14: qty 1

## 2018-02-14 MED ORDER — FENTANYL CITRATE (PF) 100 MCG/2ML IJ SOLN
INTRAMUSCULAR | Status: AC
  Administered 2018-02-14: 50 ug via INTRAVENOUS
  Filled 2018-02-14: qty 2

## 2018-02-14 MED ORDER — FENTANYL CITRATE (PF) 100 MCG/2ML IJ SOLN
INTRAMUSCULAR | Status: AC
  Filled 2018-02-14: qty 2

## 2018-02-14 MED ORDER — DOCUSATE SODIUM 100 MG PO CAPS
100.0000 mg | ORAL_CAPSULE | Freq: Two times a day (BID) | ORAL | Status: DC
  Administered 2018-02-14 – 2018-02-17 (×7): 100 mg via ORAL
  Filled 2018-02-14 (×7): qty 1

## 2018-02-14 MED ORDER — GABAPENTIN 300 MG PO CAPS
300.0000 mg | ORAL_CAPSULE | Freq: Three times a day (TID) | ORAL | Status: DC
  Administered 2018-02-14 – 2018-02-17 (×9): 300 mg via ORAL
  Filled 2018-02-14 (×9): qty 1

## 2018-02-14 MED ORDER — ROCURONIUM BROMIDE 100 MG/10ML IV SOLN: INTRAVENOUS | Status: DC | PRN

## 2018-02-14 MED ORDER — ROCURONIUM BROMIDE 50 MG/5ML IV SOLN
INTRAVENOUS | Status: AC
  Filled 2018-02-14: qty 1

## 2018-02-14 MED ORDER — TRAMADOL HCL 50 MG PO TABS
50.0000 mg | ORAL_TABLET | Freq: Four times a day (QID) | ORAL | Status: DC
  Administered 2018-02-14 – 2018-02-17 (×8): 50 mg via ORAL
  Filled 2018-02-14 (×9): qty 1

## 2018-02-14 MED ORDER — CLINDAMYCIN PHOSPHATE 600 MG/50ML IV SOLN
INTRAVENOUS | Status: AC
  Filled 2018-02-14: qty 50

## 2018-02-14 MED ORDER — DEXAMETHASONE SODIUM PHOSPHATE 10 MG/ML IJ SOLN
INTRAMUSCULAR | Status: AC
  Filled 2018-02-14: qty 1

## 2018-02-14 MED ORDER — MIDAZOLAM HCL 2 MG/2ML IJ SOLN
INTRAMUSCULAR | Status: AC
  Filled 2018-02-14: qty 2

## 2018-02-14 MED ORDER — PROPOFOL 10 MG/ML IV BOLUS
INTRAVENOUS | Status: DC | PRN
  Administered 2018-02-14: 150 mg via INTRAVENOUS

## 2018-02-14 MED ORDER — DEXAMETHASONE SODIUM PHOSPHATE 10 MG/ML IJ SOLN
INTRAMUSCULAR | Status: DC | PRN
  Administered 2018-02-14: 5 mg via INTRAVENOUS

## 2018-02-14 MED ORDER — SODIUM CHLORIDE 0.9 % IV SOLN
INTRAVENOUS | Status: DC
  Administered 2018-02-14 – 2018-02-16 (×4): via INTRAVENOUS

## 2018-02-14 MED ORDER — FAMOTIDINE 20 MG PO TABS
20.0000 mg | ORAL_TABLET | Freq: Once | ORAL | Status: AC
  Administered 2018-02-14: 20 mg via ORAL

## 2018-02-14 MED ORDER — SIMVASTATIN 20 MG PO TABS
20.0000 mg | ORAL_TABLET | Freq: Every day | ORAL | Status: DC
  Administered 2018-02-14 – 2018-02-16 (×3): 20 mg via ORAL
  Filled 2018-02-14 (×4): qty 1

## 2018-02-14 MED ORDER — CEFAZOLIN SODIUM-DEXTROSE 1-4 GM/50ML-% IV SOLN
1.0000 g | Freq: Four times a day (QID) | INTRAVENOUS | Status: AC
  Administered 2018-02-14 (×2): 1 g via INTRAVENOUS
  Filled 2018-02-14 (×2): qty 50

## 2018-02-14 MED ORDER — MORPHINE SULFATE 4 MG/ML IJ SOLN
INTRAMUSCULAR | Status: DC | PRN
  Administered 2018-02-14: 4 mg

## 2018-02-14 MED ORDER — KETOROLAC TROMETHAMINE 30 MG/ML IJ SOLN
INTRAMUSCULAR | Status: AC
  Filled 2018-02-14: qty 1

## 2018-02-14 MED ORDER — ACETAMINOPHEN 10 MG/ML IV SOLN
INTRAVENOUS | Status: DC | PRN
  Administered 2018-02-14: 1000 mg via INTRAVENOUS

## 2018-02-14 MED ORDER — GLYCOPYRROLATE 0.2 MG/ML IJ SOLN
INTRAMUSCULAR | Status: AC
  Filled 2018-02-14: qty 1

## 2018-02-14 MED ORDER — FERROUS SULFATE 325 (65 FE) MG PO TABS
325.0000 mg | ORAL_TABLET | Freq: Two times a day (BID) | ORAL | Status: DC
  Administered 2018-02-15 – 2018-02-17 (×5): 325 mg via ORAL
  Filled 2018-02-14 (×6): qty 1

## 2018-02-14 MED ORDER — KETOROLAC TROMETHAMINE 30 MG/ML IJ SOLN
INTRAMUSCULAR | Status: DC | PRN
  Administered 2018-02-14: 30 mg

## 2018-02-14 MED ORDER — OXYCODONE HCL 5 MG PO TABS: 5.0000 mg | ORAL_TABLET | Freq: Once | ORAL | Status: DC | PRN

## 2018-02-14 MED ORDER — METOPROLOL SUCCINATE ER 25 MG PO TB24
50.0000 mg | ORAL_TABLET | Freq: Once | ORAL | Status: DC
  Filled 2018-02-14: qty 2

## 2018-02-14 MED ORDER — NEOMYCIN-POLYMYXIN B GU 40-200000 IR SOLN
Status: DC | PRN
  Administered 2018-02-14: 16 mL

## 2018-02-14 MED ORDER — ONDANSETRON HCL 4 MG PO TABS: 4.0000 mg | ORAL_TABLET | Freq: Four times a day (QID) | ORAL | Status: DC | PRN

## 2018-02-14 MED ORDER — MENTHOL 3 MG MT LOZG
1.0000 | LOZENGE | OROMUCOSAL | Status: DC | PRN
  Filled 2018-02-14: qty 9

## 2018-02-14 MED ORDER — LABETALOL HCL 5 MG/ML IV SOLN
INTRAVENOUS | Status: AC
  Filled 2018-02-14: qty 4

## 2018-02-14 MED ORDER — METOPROLOL TARTRATE 50 MG PO TABS
ORAL_TABLET | ORAL | Status: AC
  Administered 2018-02-14: 50 mg
  Filled 2018-02-14: qty 1

## 2018-02-14 MED ORDER — ONDANSETRON HCL 4 MG/2ML IJ SOLN: 4.0000 mg | Freq: Four times a day (QID) | INTRAMUSCULAR | Status: DC | PRN

## 2018-02-14 MED ORDER — MORPHINE SULFATE (PF) 4 MG/ML IV SOLN
INTRAVENOUS | Status: AC
  Filled 2018-02-14: qty 1

## 2018-02-14 MED ORDER — ACETAMINOPHEN 325 MG PO TABS
325.0000 mg | ORAL_TABLET | Freq: Four times a day (QID) | ORAL | Status: DC | PRN
  Administered 2018-02-16 – 2018-02-17 (×3): 650 mg via ORAL
  Filled 2018-02-14 (×3): qty 2

## 2018-02-14 MED ORDER — CEFAZOLIN SODIUM-DEXTROSE 2-4 GM/100ML-% IV SOLN
INTRAVENOUS | Status: AC
  Filled 2018-02-14: qty 100

## 2018-02-14 MED ORDER — BUPIVACAINE-EPINEPHRINE 0.25% -1:200000 IJ SOLN
INTRAMUSCULAR | Status: DC | PRN
  Administered 2018-02-14: 60 mL

## 2018-02-14 SURGICAL SUPPLY — 65 items
BLADE SAW 90X13X1.19 OSCILLAT (BLADE) ×2 IMPLANT
BLADE SAW 90X25X1.19 OSCILLAT (BLADE) IMPLANT
CANISTER SUCT 1200ML W/VALVE (MISCELLANEOUS) ×2 IMPLANT
CANISTER SUCT 3000ML PPV (MISCELLANEOUS) ×4 IMPLANT
CEMENT HV SMART SET (Cement) ×4 IMPLANT
CEMENT TIBIA MBT (Knees) ×1 IMPLANT
CNTNR SPEC 2.5X3XGRAD LEK (MISCELLANEOUS) ×1
CONT SPEC 4OZ STER OR WHT (MISCELLANEOUS) ×1
CONTAINER SPEC 2.5X3XGRAD LEK (MISCELLANEOUS) ×1 IMPLANT
COOLER POLAR GLACIER W/PUMP (MISCELLANEOUS) ×2 IMPLANT
CUFF TOURN 24 STER (MISCELLANEOUS) IMPLANT
CUFF TOURN 30 STER DUAL PORT (MISCELLANEOUS) ×2 IMPLANT
DRAPE IMP U-DRAPE 54X76 (DRAPES) ×2 IMPLANT
DRAPE INCISE IOBAN 66X60 STRL (DRAPES) ×2 IMPLANT
DRAPE SHEET LG 3/4 BI-LAMINATE (DRAPES) ×4 IMPLANT
DRAPE SURG 17X11 SM STRL (DRAPES) ×4 IMPLANT
DRSG OPSITE POSTOP 4X12 (GAUZE/BANDAGES/DRESSINGS) ×2 IMPLANT
DRSG OPSITE POSTOP 4X14 (GAUZE/BANDAGES/DRESSINGS) ×2 IMPLANT
DURAPREP 26ML APPLICATOR (WOUND CARE) ×6 IMPLANT
ELECT REM PT RETURN 9FT ADLT (ELECTROSURGICAL) ×2
ELECTRODE REM PT RTRN 9FT ADLT (ELECTROSURGICAL) ×1 IMPLANT
GAUZE SPONGE 4X4 12PLY STRL (GAUZE/BANDAGES/DRESSINGS) ×2 IMPLANT
GLOVE BIOGEL PI IND STRL 9 (GLOVE) ×1 IMPLANT
GLOVE BIOGEL PI INDICATOR 9 (GLOVE) ×1
GLOVE SURG 9.0 ORTHO LTXF (GLOVE) ×4 IMPLANT
GOWN STRL REUS TWL 2XL XL LVL4 (GOWN DISPOSABLE) ×2 IMPLANT
GOWN STRL REUS W/ TWL LRG LVL3 (GOWN DISPOSABLE) ×1 IMPLANT
GOWN STRL REUS W/ TWL LRG LVL4 (GOWN DISPOSABLE) ×1 IMPLANT
GOWN STRL REUS W/TWL LRG LVL3 (GOWN DISPOSABLE) ×1
GOWN STRL REUS W/TWL LRG LVL4 (GOWN DISPOSABLE) ×1
HOLDER FOLEY CATH W/STRAP (MISCELLANEOUS) ×2 IMPLANT
IMMBOLIZER KNEE 19 BLUE UNIV (SOFTGOODS) ×2 IMPLANT
IMPL FEMUR SIGMA LT PS SZ 3 (Knees) ×1 IMPLANT
IMPLANT FEMUR SIGMA LT PS SZ 3 (Knees) ×2 IMPLANT
INSERT TIBIAL PFC SIG SZ3 10MM (Knees) ×2 IMPLANT
KIT TURNOVER KIT A (KITS) ×2 IMPLANT
NDL SAFETY ECLIPSE 18X1.5 (NEEDLE) ×1 IMPLANT
NEEDLE HYPO 18GX1.5 SHARP (NEEDLE) ×1
NEEDLE HYPO 22GX1.5 SAFETY (NEEDLE) ×2 IMPLANT
NEEDLE SPNL 20GX3.5 QUINCKE YW (NEEDLE) ×2 IMPLANT
NS IRRIG 1000ML POUR BTL (IV SOLUTION) ×2 IMPLANT
PACK TOTAL KNEE (MISCELLANEOUS) ×2 IMPLANT
PAD PREP 24X41 OB/GYN DISP (PERSONAL CARE ITEMS) ×2 IMPLANT
PAD WRAPON POLAR KNEE (MISCELLANEOUS) ×1 IMPLANT
PATELLA DOME PFC 38MM (Knees) ×2 IMPLANT
PIN STEINMAN FIXATION KNEE (PIN) ×2 IMPLANT
PULSAVAC PLUS IRRIG FAN TIP (DISPOSABLE) ×2
SOL .9 NS 3000ML IRR  AL (IV SOLUTION) ×1
SOL .9 NS 3000ML IRR UROMATIC (IV SOLUTION) ×1 IMPLANT
SPONGE DRAIN TRACH 4X4 STRL 2S (GAUZE/BANDAGES/DRESSINGS) ×2 IMPLANT
SPONGE LAP 18X18 RF (DISPOSABLE) IMPLANT
STAPLER SKIN PROX 35W (STAPLE) ×2 IMPLANT
SUCTION FRAZIER HANDLE 10FR (MISCELLANEOUS) ×1
SUCTION TUBE FRAZIER 10FR DISP (MISCELLANEOUS) ×1 IMPLANT
SUT ETHIBOND NAB CT1 #1 30IN (SUTURE) ×6 IMPLANT
SUT VIC AB 0 CT1 36 (SUTURE) ×4 IMPLANT
SUT VIC AB 2-0 CT1 (SUTURE) ×4 IMPLANT
SYR 20CC LL (SYRINGE) ×2 IMPLANT
SYR 30ML LL (SYRINGE) ×4 IMPLANT
TIBIA MBT CEMENT (Knees) ×2 IMPLANT
TIP FAN IRRIG PULSAVAC PLUS (DISPOSABLE) ×1 IMPLANT
TOWER CARTRIDGE SMART MIX (DISPOSABLE) ×2 IMPLANT
TRAY FOLEY MTR SLVR 16FR STAT (SET/KITS/TRAYS/PACK) ×2 IMPLANT
TUBE SUCT KAM VAC (TUBING) ×2 IMPLANT
WRAPON POLAR PAD KNEE (MISCELLANEOUS) ×2

## 2018-02-14 NOTE — Progress Notes (Signed)
IS education complete, pt understands technique and reason for use. Pt inspire 1621ml+. Pt independent with use.

## 2018-02-14 NOTE — Anesthesia Procedure Notes (Signed)
Procedure Name: Intubation Date/Time: 02/14/2018 9:04 AM Performed by: Lowry Bowl, CRNA Pre-anesthesia Checklist: Patient identified, Emergency Drugs available, Suction available and Patient being monitored Patient Re-evaluated:Patient Re-evaluated prior to induction Oxygen Delivery Method: Circle system utilized Preoxygenation: Pre-oxygenation with 100% oxygen Induction Type: IV induction and Cricoid Pressure applied Ventilation: Mask ventilation without difficulty Laryngoscope Size: Mac and 3 Grade View: Grade II Tube type: Oral Tube size: 7.0 mm Number of attempts: 1 Airway Equipment and Method: Stylet Placement Confirmation: ETT inserted through vocal cords under direct vision,  positive ETCO2 and breath sounds checked- equal and bilateral Secured at: 22 cm Tube secured with: Tape Dental Injury: Teeth and Oropharynx as per pre-operative assessment

## 2018-02-14 NOTE — Evaluation (Signed)
Physical Therapy Evaluation Patient Details Name: Hailey Farrell MRN: [id number] DOB: 1951/01/26 Today's Date: 02/14/2018   History of Present Illness  67 y/o female s/p L TKA 02/14/18.  Clinical Impression  Pt showed good effort t/o the PT session despite considerable pain the entire time.  Despite discomfort she was eager to try to get up and do some standing but once up at EOB she felt poorly and needed needed considerable assist to get back into bed and situated.  She did not vomit, but was quite nauseated and not appropriate for standing/ambulation.  Pt did well with the 15 minutes of exercises apart from PT exam as well as with bed mobility activities.  Pt hoping to be able to go home, unable to give full assessment at this time and therefore suggesting STR per today's performance.    Follow Up Recommendations SNF(per progress, hoping to go home)    Equipment Recommendations  Rolling walker with 5" wheels    Recommendations for Other Services       Precautions / Restrictions Precautions Precautions: Fall;Knee Restrictions Weight Bearing Restrictions: Yes LLE Weight Bearing: Weight bearing as tolerated      Mobility  Bed Mobility Overal bed mobility: Needs Assistance Bed Mobility: Supine to Sit;Sit to Supine     Supine to sit: Min assist Sit to supine: Mod assist   General bed mobility comments: Pt showed great effort in getting to sitting, once there started feeling nauseated and generally not well, assisted back to supine where she felt better  Transfers                 General transfer comment: Pt to nauseated in sitting to attempt standing  Ambulation/Gait                Stairs            Wheelchair Mobility    Modified Rankin (Stroke Patients Only)       Balance Overall balance assessment: Needs assistance Sitting-balance support: Bilateral upper extremity supported;Feet supported Sitting balance-Leahy Scale: Fair Sitting balance -  Comments: Pt feeling poorly in sitting and needed to lay back down relatively quickly                                     Pertinent Vitals/Pain Pain Assessment: 0-10 Pain Score: 3 (pain greatly increases with any activity)    Home Living Family/patient expects to be discharged to:: Private residence Living Arrangements: Spouse/significant other Available Help at Discharge: Family Type of Home: House Home Access: Stairs to enter Entrance Stairs-Rails: Right Entrance Stairs-Number of Steps: 1 Home Layout: One level Home Equipment: Walker - standard;Cane - single point      Prior Function Level of Independence: Independent with assistive device(s)         Comments: SPC used for community ambulation during grocery shopping, etc.     Hand Dominance        Extremity/Trunk Assessment   Upper Extremity Assessment Upper Extremity Assessment: Overall WFL for tasks assessed    Lower Extremity Assessment Lower Extremity Assessment: (expected post-op weakness in L, otherwise functional)       Communication   Communication: No difficulties  Cognition Arousal/Alertness: Lethargic Behavior During Therapy: WFL for tasks assessed/performed Overall Cognitive Status: Within Functional Limits for tasks assessed  General Comments      Exercises Total Joint Exercises Ankle Circles/Pumps: AROM;10 reps Quad Sets: Strengthening;10 reps Gluteal Sets: Strengthening;10 reps Heel Slides: AAROM;5 reps Hip ABduction/ADduction: Strengthening;10 reps Straight Leg Raises: AAROM;5 reps Knee Flexion: PROM Goniometric ROM: 2-54   Assessment/Plan    PT Assessment Patient needs continued PT services  PT Problem List Decreased strength;Decreased activity tolerance;Decreased range of motion;Decreased balance;Decreased mobility;Decreased coordination;Decreased cognition;Decreased safety awareness;Pain;Decreased knowledge  of use of DME;Cardiopulmonary status limiting activity       PT Treatment Interventions DME instruction;Gait training;Stair training;Functional mobility training;Therapeutic activities;Balance training;Neuromuscular re-education;Therapeutic exercise;Patient/family education    PT Goals (Current goals can be found in the Care Plan section)  Acute Rehab PT Goals Patient Stated Goal: go home if she can PT Goal Formulation: With patient Time For Goal Achievement: 02/28/18 Potential to Achieve Goals: Good    Frequency BID   Barriers to discharge        Co-evaluation               AM-PAC PT "6 Clicks" Daily Activity  Outcome Measure Difficulty turning over in bed (including adjusting bedclothes, sheets and blankets)?: A Little Difficulty moving from lying on back to sitting on the side of the bed? : Unable Difficulty sitting down on and standing up from a chair with arms (e.g., wheelchair, bedside commode, etc,.)?: Unable Help needed moving to and from a bed to chair (including a wheelchair)?: Total Help needed walking in hospital room?: Total Help needed climbing 3-5 steps with a railing? : Total 6 Click Score: 8    End of Session Equipment Utilized During Treatment: Gait belt Activity Tolerance: Patient limited by pain Patient left: with bed alarm set;with call bell/phone within reach;with family/visitor present Nurse Communication: Mobility status PT Visit Diagnosis: Muscle weakness (generalized) (M62.81);Difficulty in walking, not elsewhere classified (R26.2)    Time: 0076-2263 PT Time Calculation (min) (ACUTE ONLY): 40 min   Charges:   PT Evaluation $PT Eval Low Complexity: 1 Low PT Treatments $Therapeutic Exercise: 8-22 mins $Therapeutic Activity: 8-22 mins        Kreg Shropshire, DPT 02/14/2018, 5:37 PM

## 2018-02-14 NOTE — Op Note (Signed)
DATE OF SURGERY:  02/14/2018 TIME: 1:07 PM  PATIENT NAME:  Hailey Farrell   AGE: 67 y.o.    PRE-OPERATIVE DIAGNOSIS:  OSTEOARTHRITIS LEFT KNEE  POST-OPERATIVE DIAGNOSIS:  Same  PROCEDURE:  Procedure(s): LEFT TOTAL KNEE ARTHROPLASTY  SURGEON:  Thornton Park, MD   ASSISTANT:  Wyatt Portela, PA  OPERATIVE IMPLANTS: Depuy PFC Sigma, Posterior Stabilized Femural component size 3, Tibia size rotating platform component size 3, Patella polyethylene 3-peg oval button size 38, with a 10 mm polyethylene insert.   PREOPERATIVE INDICATIONS:  Hailey Farrell is an 67 y.o. female who has a diagnosis of left tricompartmental osteoarthritis and elected for a left total knee arthroplasty after failing nonoperative treatment who has significant impairment of their activities of daily living including ambulation.  Radiographs have demonstrated tricompartmental osteoarthritis joint space narrowing, osteophytes, subchondral sclerosis and cyst formation.  The risks, benefits, and alternatives were discussed at length including but not limited to the risks of infection, bleeding, nerve or blood vessel injury, knee stiffness, fracture, dislocation, loosening or failure of the hardware and the need for further surgery. Medical risks include but not limited to DVT and pulmonary embolism, myocardial infarction, stroke, pneumonia, respiratory failure and death. I discussed these risks with the patient in my office prior to the date of surgery. They understood these risks and were willing to proceed.  OPERATIVE FINDINGS AND UNIQUE ASPECTS OF THE CASE: Severe tricompartmental osteoarthritis   OPERATIVE DESCRIPTION:  The patient was brought to the operative room and placed in a supine position after undergoing general anesthesia.  A Foley catheter was placed.  IV antibiotics were given. Patient received ancef and clindamycin. The lower extremity was prepped and draped in the usual sterile fashion.  A time out was  performed to verify the patient's name, date of birth, medical record number, correct site of surgery and correct procedure to be performed. The timeout was also used to confirm the patient received antibiotics and that appropriate instruments, implants and radiographs studies were available in the room.  The leg was elevated and exsanguinated with an Esmarch and the tourniquet was inflated to 275 mmHg for 125 minutes..  A midline incision was made over the left knee incorporating her previous incision for a BTB ACL reconstruction. Full-thickness skin flaps were developed. A medial parapatellar arthrotomy was then made and the patella everted and the knee was brought into 90 of flexion. Hoffa's fat pad along with the cruciate ligaments and medial and lateral menisci were resected.   The tibial interference screw from the patient's previous ACL was identified.  This was removed with a screwdriver from the universal screw removal set.  The distal femoral intramedullary canal was opened with a drill and the intramedullary distal femoral cutting jig was inserted into the femoral canal pinned into position. It was set at 5 degrees resecting 10 mm off the distal femur.  Care was taken to protect the collateral ligaments during distal femoral resection.  The distal femoral resection was performed with an oscillating saw. The femoral cutting guide was then removed.  The hardware from femoral fixation of the patient's previous ACL was identified above the lateral condyles of the femur.  This was removed along with the suture knot stack.  The extramedullary tibial cutting guide was then placed using the anterior tibial crest and second ray of the foot as a references.  The tibial cutting guide was adjusted to allow for appropriate posterior slope.  The tibial cutting block was pinned into position. The  slotted stylus was used to measure the proximal tibial resection of 10 mm off the high lateral side.  The tibial long  rod alignment guide was then used to confirm position of the cutting block. A third cross pin through the tibial cutting block was then drilled into position to allow for rotational stability. Care was taken during the tibial resection to protect the medial and collateral ligaments.  The resected tibial bone was removed along with the posterior horns of the menisci.  The PCL was sacrificed.  Extension gap was measured with a spacer block and alignment and extension was confirmed using a long alignment rod.  An additional 2 mm of distal femur was resected to improve the patient's knee extension.  The attention was then turned back to the femur. The posterior referencing distal femoral sizing guide was applied to the distal femur.  The femur was sized to be a 3. Rotation of the referencing guide was checked with the epicondylar axis and Whitesides line. Then the 4-in-1 cutting jig was then applied to the distal femur. A stylus was used to confirm that the anterior femur would not be notched.   Then the anterior, posterior and chamfer femoral cuts were then made with an oscillating saw.  All posterior osteophytes were removed.  The flexion gap was then measured with a flexion spacer block and long alignment rod and was found to be symmetric with the extension gap and perpendicular to mechanical axis of the tibia.  The distal femoral preparation was completed by performing the posterior stabilized box cut using the cutting block. The entry site for the intramedullary femoral guide was filled with autologous bone graft from bone previously resected earlier in the case.  The proximal tibia plateau was then sized with trial trays. The best coverage was achieved with a size 3. This tibial tray was then pinned into position. The proximal tibia was then prepared with the reamer and keel punch.  After tibial preparation was completed, all trial components were inserted with polyethylene trials.  The knee was found to  have excellent balance and full motion with a size 10 mm tibial polyethylene insert.  The attention was then turned to preparation of the patella. The thickness of the patella was measured with a caliper, the diameter measured with the patella templates.  The patella resection was then made with an oscillating saw using the patella cutting guide.  3 peg holes for the patella component were then drilled. The trial patella was then placed. Knee was taken through a full range of motion and deemed to be stable with the trial components. All trial components were then removed.   The tibial tunnel from the previous ACL surgery was bone grafted with autograft taken from resected autograft bone.  The knee capsule was then injected with Exparel. The joint was copiously irrigated with pulse lavage.  The final total knee arthroplasty components were then cemented into place with a 10 mm trial polyethylene insert and all excess methylmethacrylate was removed.  The joint was again copiously irrigated. After the cement had hardened the knee was again taken through a full range of motion. It was felt to be most stable with the 10 mm tibial polyethylene insert. The actual tibial polyethylene insert was then placed.   The knee was taken through a range of motion and the patella tracked well and the knee was again irrigated copiously.    The medial arthrotomy was closed with #1 Ethibond. The subcutaneous tissue closed with  0 and 2-0 vicryl, and skin approximated with staples.  A dry sterile and compressive dressing was applied.  A Polar Care was applied to the operative knee along with a knee immobilizer.  The patient was awakened and brought to the PACU in stable and satisfactory condition.  All sharp, lap and instrument counts were correct at the conclusion the case. I spoke with the patient's family in the postop consultation room, including her husband, to let them know the case had been performed without complication and  the patient was stable in recovery room.

## 2018-02-14 NOTE — Transfer of Care (Signed)
Immediate Anesthesia Transfer of Care Note  Patient: [name]  Procedure(s) Performed: TOTAL KNEE ARTHROPLASTY (Left Knee)  Patient Location: PACU  Anesthesia Type:General  Level of Consciousness: awake, oriented, drowsy and patient cooperative  Airway & Oxygen Therapy: Patient Spontanous Breathing and Patient connected to face mask oxygen  Post-op Assessment: Report given to RN, Post -op Vital signs reviewed and stable and Patient moving all extremities  Post vital signs: Reviewed and stable  Last Vitals:  Vitals Value Taken Time  BP 131/98 02/14/2018 12:43 PM  Temp 36.6 C 02/14/2018 12:43 PM  Pulse 66 02/14/2018 12:49 PM  Resp 12 02/14/2018 12:49 PM  SpO2 100 % 02/14/2018 12:49 PM  Vitals shown include unvalidated device data.  Last Pain:  Vitals:   02/14/18 1243  TempSrc:   PainSc: 2       Patients Stated Pain Goal: 0 (12/81/18 8677)  Complications: No apparent anesthesia complications

## 2018-02-14 NOTE — Progress Notes (Signed)
  Subjective:  POST-OP NOTE: The patient is sleeping comfortably.  Her husband is at the bedside.  Objective:   VITALS:   Vitals:   02/14/18 1444 02/14/18 1521 02/14/18 1709 02/14/18 1824  BP: 133/65 126/74 117/75 114/63  Pulse: 61 72 66 81  Resp:      Temp: 97.6 F (36.4 C) 98.7 F (37.1 C) 98.4 F (36.9 C) 98.6 F (37 C)  TempSrc: Oral Oral Oral Oral  SpO2: 92% 100% 94% 97%  Weight:      Height:        PHYSICAL EXAM: Deferred as the patient was sleeping.  Patient in no acute distress.  LABS  No results found for this or any previous visit (from the past 24 hour(s)).  Dg Knee Left Port  Result Date: 02/14/2018 CLINICAL DATA:  Total knee arthroplasty left EXAM: PORTABLE LEFT KNEE - 1-2 VIEW COMPARISON:  None. FINDINGS: Total knee replacement in satisfactory position alignment. No fracture or acute complication. Gas in the joint and surrounding soft tissues. IMPRESSION: Satisfactory total knee replacement. Electronically Signed   By: Franchot Gallo M.D.   On: 02/14/2018 13:26    Assessment/Plan: Day of Surgery   Active Problems:   History of total knee arthroplasty, left   History of total knee arthroplasty  I reviewed the postoperative x-ray.  Components are well-positioned.  There is no evidence of loosening.  There is no fracture or dislocation.  Patient will complete 24 hours of postop antibiotics.  Foley catheter will be removed in the morning.  Labs will be checked in the a.m.  Patient will continue physical therapy tomorrow.  Patient will begin on Lovenox for DVT prophylaxis.    Thornton Park , MD 02/14/2018, 7:17 PM

## 2018-02-14 NOTE — Anesthesia Post-op Follow-up Note (Signed)
Anesthesia QCDR form completed.        

## 2018-02-14 NOTE — Anesthesia Preprocedure Evaluation (Signed)
Anesthesia Evaluation  Patient identified by MRN, date of birth, ID band Patient awake    Reviewed: Allergy & Precautions, H&P , NPO status , Patient's Chart, lab work & pertinent test results  History of Anesthesia Complications Negative for: history of anesthetic complications  Airway Mallampati: III  TM Distance: >3 FB Neck ROM: full    Dental  (+) Chipped, Poor Dentition, Missing   Pulmonary shortness of breath and with exertion, COPD, former smoker,           Cardiovascular Exercise Tolerance: Good hypertension, (-) angina(-) Past MI      Neuro/Psych  Headaches,  Neuromuscular disease negative psych ROS   GI/Hepatic Neg liver ROS, hiatal hernia, PUD, GERD  Medicated and Controlled,  Endo/Other  negative endocrine ROS  Renal/GU      Musculoskeletal  (+) Arthritis ,   Abdominal   Peds  Hematology negative hematology ROS (+)   Anesthesia Other Findings Past Medical History: No date: Anemia No date: Arthritis 1990: Breast cancer ([place])     Comment:  Right No date: COPD (chronic obstructive pulmonary disease) ([facility]) No date: Dyspnea     Comment:  DOE No date: Elevated lipids No date: Hypertension No date: Lower extremity edema No date: Migraines No date: Migraines No date: Restless leg syndrome No date: Rotator cuff injury  Past Surgical History: No date: ABDOMINAL HYSTERECTOMY 08/2017: BACK SURGERY     Comment:  CERVICAL FUSION 1990: BREAST BIOPSY; Right     Comment:  LUMPECTOMY.  took lymph nodes 07/20/2017: COLONOSCOPY WITH PROPOFOL; N/A     Comment:  Procedure: COLONOSCOPY WITH PROPOFOL;  Surgeon:               Virgel Manifold, MD;  Location: [facility] ENDOSCOPY;                Service: Endoscopy;  Laterality: N/A; 08/08/2017: ENTEROSCOPY; N/A     Comment:  Procedure: ENTEROSCOPY;  Surgeon: Lin Landsman,               MD;  Location: [facility] ENDOSCOPY;  Service:               Gastroenterology;   Laterality: N/A; 04/13/2017: ESOPHAGOGASTRODUODENOSCOPY; Left     Comment:  Procedure: ESOPHAGOGASTRODUODENOSCOPY (EGD);  Surgeon:               Virgel Manifold, MD;  Location[facility] ENDOSCOPY;                Service: Endoscopy;  Laterality: Left; 07/20/2017: ESOPHAGOGASTRODUODENOSCOPY (EGD) WITH PROPOFOL; N/A     Comment:  Procedure: ESOPHAGOGASTRODUODENOSCOPY (EGD) WITH               PROPOFOL;  Surgeon: Virgel Manifold, MD;  Location:               [facility] ENDOSCOPY;  Service: Endoscopy;  Laterality: N/A; 12/12/2017: ESOPHAGOGASTRODUODENOSCOPY (EGD) WITH PROPOFOL; N/A     Comment:  Procedure: ESOPHAGOGASTRODUODENOSCOPY (EGD) WITH               PROPOFOL;  Surgeon: Virgel Manifold, MD;  Location:               [facility] ENDOSCOPY;  Service: Endoscopy;  Laterality: N/A; 1990: MEDIAL PARTIAL KNEE REPLACEMENT; Left     Comment:  torn ligament. no knee replacement at that time No date: PARTIAL HYSTERECTOMY 03/01/2016: SHOULDER ARTHROSCOPY WITH OPEN ROTATOR CUFF REPAIR; Left     Comment:  Procedure: SHOULDER ARTHROSCOPY WITH OPEN ROTATOR CUFF  REPAIR;  Surgeon: Thornton Park, MD;  Location: [facility]               ORS;  Service: Orthopedics;  Laterality: Left;  BMI    Body Mass Index:  31.65 kg/m      Reproductive/Obstetrics negative OB ROS                             Anesthesia Physical Anesthesia Plan  ASA: III  Anesthesia Plan: General ETT   Post-op Pain Management:    Induction: Intravenous  PONV Risk Score and Plan: Ondansetron, Dexamethasone, Midazolam and Treatment may vary due to age or medical condition  Airway Management Planned: Oral ETT  Additional Equipment:   Intra-op Plan:   Post-operative Plan: Extubation in OR  Informed Consent: I have reviewed the patients History and Physical, chart, labs and discussed the procedure including the risks, benefits and alternatives for the proposed anesthesia with the patient or  authorized representative who has indicated his/her understanding and acceptance.   Dental Advisory Given  Plan Discussed with: Anesthesiologist, CRNA and Surgeon  Anesthesia Plan Comments: (Patient declines spinal and requests GA  Patient consented for risks of anesthesia including but not limited to:  - adverse reactions to medications - damage to teeth, lips or other oral mucosa - sore throat or hoarseness - Damage to heart, brain, lungs or loss of life  Patient voiced understanding.)        Anesthesia Quick Evaluation

## 2018-02-14 NOTE — H&P (Signed)
PREOPERATIVE H&P  Chief Complaint: OSTEOARTHRITIS LEFT KNEE  HPI: Hailey Farrell is a 67 y.o. female who presents for preoperative history and physical with a diagnosis of OSTEOARTHRITIS LEFT KNEE. Patient has history of prior ACL reconstruction in this knee years ago.  Symptoms are rated as moderate to severe, and have been worsening.  This is significantly impairing activities of daily living including ambulation.  She has elected for surgical management.   Past Medical History:  Diagnosis Date  . Anemia   . Arthritis   . Breast cancer ([place]) 1990   Right  . COPD (chronic obstructive pulmonary disease) ([place])   . Dyspnea    DOE  . Elevated lipids   . Hypertension   . Lower extremity edema   . Migraines   . Migraines   . Restless leg syndrome   . Rotator cuff injury    Past Surgical History:  Procedure Laterality Date  . ABDOMINAL HYSTERECTOMY    . BACK SURGERY  08/2017   CERVICAL FUSION  . BREAST BIOPSY Right 1990   LUMPECTOMY.  took lymph nodes  . COLONOSCOPY WITH PROPOFOL N/A 07/20/2017   Procedure: COLONOSCOPY WITH PROPOFOL;  Surgeon: Virgel Manifold, MD;  Location: [facility] ENDOSCOPY;  Service: Endoscopy;  Laterality: N/A;  . ENTEROSCOPY N/A 08/08/2017   Procedure: ENTEROSCOPY;  Surgeon: Lin Landsman, MD;  Location[facility]COPY;  Service: Gastroenterology;  Laterality: N/A;  . ESOPHAGOGASTRODUODENOSCOPY Left 04/13/2017   Procedure: ESOPHAGOGASTRODUODENOSCOPY (EGD);  Surgeon: Virgel Manifold, MD;  Location: [facility] ENDOSCOPY;  Service: Endoscopy;  Laterality: Left;  . ESOPHAGOGASTRODUODENOSCOPY (EGD) WITH PROPOFOL N/A 07/20/2017   Procedure: ESOPHAGOGASTRODUODENOSCOPY (EGD) WITH PROPOFOL;  Surgeon: Virgel Manifold, MD;  Location: [facility] ENDOSCOPY;  Service: Endoscopy;  Laterality: N/A;  . ESOPHAGOGASTRODUODENOSCOPY (EGD) WITH PROPOFOL N/A 12/12/2017   Procedure: ESOPHAGOGASTRODUODENOSCOPY (EGD) WITH PROPOFOL;  Surgeon: Virgel Manifold, MD;  Location: [facility]  ENDOSCOPY;  Service: Endoscopy;  Laterality: N/A;  . MEDIAL PARTIAL KNEE REPLACEMENT Left 1990   torn ligament. no knee replacement at that time  . PARTIAL HYSTERECTOMY    . SHOULDER ARTHROSCOPY WITH OPEN ROTATOR CUFF REPAIR Left 03/01/2016   Procedure: SHOULDER ARTHROSCOPY WITH OPEN ROTATOR CUFF REPAIR;  Surgeon: Thornton Park, MD;  Location: [facility] ORS;  Service: Orthopedics;  Laterality: Left;   Social History   Socioeconomic History  . Marital status: Married    Spouse name: Fritz Pickerel  . Number of children: Not on file  . Years of education: Not on file  . Highest education level: Not on file  Occupational History  . 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 Financial resource strain: Not on file  . Food insecurity:    Worry: Not on file    Inability: Not on file  . Transportation needs:    Medical: Not on file    Non-medical: Not on file  Tobacco Use  . Smoking status: Former Smoker    Packs/day: 0.10    Years: 50.00    Pack years: 5.00    Types: Cigarettes    Last attempt to quit: 03/13/2016    Years since quitting: 1.9  . Smokeless tobacco: Never Used  Substance and Sexual Activity  . Alcohol use: No  . Drug use: No  . Sexual activity: Not on file  Lifestyle  . Physical activity:    Days per week: Not on file    Minutes per session: Not on file  . Stress: Not on file  Relationships  .  Social connections:    Talks on phone: Not on file    Gets together: Not on file    Attends religious service: Not on file    Active member of club or organization: Not on file    Attends meetings of clubs or organizations: Not on file    Relationship status: Not on file  Other Topics Concern  . Not on file  Social History Narrative  . Not on file   Family History  Problem Relation Age of Onset  . CAD Mother   . CAD Sister    Allergies  Allergen Reactions  . Latex Rash    Welts over body   Prior to Admission medications   Medication Sig Start  Date End Date Taking? Authorizing Provider  acetaminophen (TYLENOL) 500 MG tablet Take 500-1,000 mg by mouth every 6 (six) hours as needed (for pain.).   Yes [provider]  budesonide-formoterol (SYMBICORT) 160-4.5 MCG/ACT inhaler Inhale 2 puffs daily into the lungs.    Yes [provider]  ferrous sulfate 325 (65 FE) MG tablet Take 1 tablet (325 mg total) by mouth 2 (two) times daily with a meal. Patient taking differently: Take 325 mg by mouth daily with breakfast.  08/08/17  Yes Wieting, Richard, MD  furosemide (LASIX) 20 MG tablet Take 20 mg daily by mouth.    Yes [provider]  lisinopril (PRINIVIL,ZESTRIL) 20 MG tablet Take 20 mg by mouth daily.   Yes [provider]  metoprolol succinate (TOPROL-XL) 50 MG 24 hr tablet Take 50 mg by mouth daily. Take with or immediately following a meal.   Yes [provider]  Polyethylene Glycol 3350 (PEG 3350) POWD Take 17 g by mouth daily as needed (for constipation.).  10/02/16  Yes [provider]  potassium chloride (MICRO-K) 10 MEQ CR capsule Take 10 mEq by mouth daily.   Yes [provider]  rOPINIRole (REQUIP) 0.5 MG tablet Take 0.5 mg by mouth at bedtime.   Yes [provider]  simvastatin (ZOCOR) 20 MG tablet Take 20 mg by mouth daily at 6 PM.   Yes [provider]  SUMAtriptan (IMITREX) 6 MG/0.5ML SOLN injection Inject 6 mg every 2 (two) hours as needed into the skin for migraine or headache. May repeat in 2 hours if headache persists or recurs.   Yes [provider]  tiotropium (SPIRIVA) 18 MCG inhalation capsule Place 18 mcg into inhaler and inhale daily.   Yes [provider]  vitamin B-12 1000 MCG tablet Take 1 tablet (1,000 mcg total) by mouth daily. 08/09/17  Yes Wieting, Richard, MD  omeprazole (PRILOSEC) 20 MG capsule Take 1 capsule (20 mg total) by mouth 2 (two) times daily before a meal for 14 days. Patient not taking: Reported on 01/25/2018  12/24/17 01/07/18  Virgel Manifold, MD     Positive ROS: All other systems have been reviewed and were otherwise negative with the exception of those mentioned in the HPI and as above.  Physical Exam: General: Alert, no acute distress Cardiovascular: Regular rate and rhythm, no murmurs rubs or gallops.  No pedal edema Respiratory: Clear to auscultation bilaterally, no wheezes rales or rhonchi. No cyanosis, no use of accessory musculature GI: No organomegaly, abdomen is soft and non-tender nondistended with positive bowel sounds. Skin: Skin intact, no lesions within the operative field. Neurologic: Sensation intact distally Psychiatric: Patient is competent for consent with normal mood and affect Lymphatic: No cervical lymphadenopathy  MUSCULOSKELETAL: Left knee:  Skin  intact.  Healed ACL incisions.  ROM 0-110 degrees.  Patient has no ligamentous laxity to varus or valgus stress testing.  She has no calf tenderness or lower leg edema.  She has palpable pedal pulses, intact sensation light touch and intact motor function.  Assessment: OSTEOARTHRITIS LEFT KNEE  Plan: Plan for Procedure(s): LEFT TOTAL KNEE ARTHROPLASTY  I reviewed the details of total knee arthroplasty surgery with the patient and her husband.  We discussed in detail the postoperative course as well.  The patient is attended the [facility] joint class.  I discussed the risks and benefits of surgery. They stand risks include but are not limited to infection, bleeding requiring blood transfusion, nerve or blood vessel injury, joint stiffness or loss of motion, persistent pain, weakness or instability, malunion, nonunion and hardware failure and the need for further surgery. Medical risks include but are not limited to DVT and pulmonary embolism, myocardial infarction, stroke, pneumonia, respiratory failure and death. Patient understood these risks and wished to proceed.    Thornton Park, MD   02/14/2018 8:55 AM

## 2018-02-14 NOTE — Anesthesia Postprocedure Evaluation (Signed)
Anesthesia Post Note  Patient: Hailey Farrell  Procedure(s) Performed: TOTAL KNEE ARTHROPLASTY (Left Knee)  Patient location during evaluation: PACU Anesthesia Type: General Level of consciousness: awake and alert Pain management: pain level controlled Vital Signs Assessment: post-procedure vital signs reviewed and stable Respiratory status: spontaneous breathing, nonlabored ventilation, respiratory function stable and patient connected to nasal cannula oxygen Cardiovascular status: blood pressure returned to baseline and stable Postop Assessment: no apparent nausea or vomiting Anesthetic complications: no     Last Vitals:  Vitals:   02/14/18 1357 02/14/18 1444  BP: (!) 107/52 133/65  Pulse: 60 61  Resp: 20   Temp: 37 C 36.4 C  SpO2: 98% 92%    Last Pain:  Vitals:   02/14/18 1444  TempSrc: Oral  PainSc:                  Hailey Farrell

## 2018-02-14 NOTE — OR Nursing (Signed)
Hardware removed from left knee -one screw. Screw placed in bio-hazard bin per MD order.

## 2018-02-15 ENCOUNTER — Encounter: Payer: Self-pay | Admitting: Orthopedic Surgery

## 2018-02-15 DIAGNOSIS — M1712 Unilateral primary osteoarthritis, left knee: Secondary | ICD-10-CM | POA: Diagnosis not present

## 2018-02-15 LAB — BASIC METABOLIC PANEL
ANION GAP: 3 — AB (ref 5–15)
BUN: 19 mg/dL (ref 8–23)
CALCIUM: 8.4 mg/dL — AB (ref 8.9–10.3)
CO2: 27 mmol/L (ref 22–32)
CREATININE: 0.89 mg/dL (ref 0.44–1.00)
Chloride: 107 mmol/L (ref 98–111)
Glucose, Bld: 124 mg/dL — ABNORMAL HIGH (ref 70–99)
Potassium: 3.6 mmol/L (ref 3.5–5.1)
SODIUM: 137 mmol/L (ref 135–145)

## 2018-02-15 LAB — CBC
HEMATOCRIT: 26.9 % — AB (ref 35.0–47.0)
Hemoglobin: 9 g/dL — ABNORMAL LOW (ref 12.0–16.0)
MCH: 27.8 pg (ref 26.0–34.0)
MCHC: 33.3 g/dL (ref 32.0–36.0)
MCV: 83.6 fL (ref 80.0–100.0)
Platelets: 176 10*3/uL (ref 150–440)
RBC: 3.22 MIL/uL — ABNORMAL LOW (ref 3.80–5.20)
RDW: 16.3 % — AB (ref 11.5–14.5)
WBC: 8.7 10*3/uL (ref 3.6–11.0)

## 2018-02-15 NOTE — NC FL2 (Signed)
[place] LEVEL OF CARE SCREENING TOOL     IDENTIFICATION  Patient Name: Hailey Farrell Birthdate: December 16, 1950 Sex: female Admission Date (Current Location): 02/14/2018  [facility] and [place] Number:  Hailey Farrell ([id number] [facility]) Facility and Address:  [facility], [address] [place], [place] [address]      Provider Number: [id number]  Attending Physician Name and Address:  Thornton Park, MD  Relative Name and Phone Number:       Current Level of Care: Hospital Recommended Level of Care: [place] Prior Approval Number:    Date Approved/Denied:   PASRR Number: [id number])  Discharge Plan: SNF    Current Diagnoses: Patient Active Problem List   Diagnosis Date Noted  . History of total knee arthroplasty, left 02/14/2018  . History of total knee arthroplasty 02/14/2018  . Intestinal metaplasia of gastric mucosa   . Columnar-lined esophagus   . Hiatal hernia   . H pylori ulcer   . Iron deficiency anemia 08/19/2017  . Acute post-hemorrhagic anemia 08/06/2017  . Acute peptic ulcer of stomach   . Common duodenal ulcer   . Personal history of colonic polyps   . Polyp of sigmoid colon   . Internal hemorrhoids   . Intestinal lump   . GIB (gastrointestinal bleeding) 04/11/2017  . S/P rotator cuff repair 03/01/2016    Orientation RESPIRATION BLADDER Height & Weight     Self, Time, Situation, Place  Normal Continent Weight: 184 lb 6 oz (83.6 kg) Height:  5\' 4"  (162.6 cm)  BEHAVIORAL SYMPTOMS/MOOD NEUROLOGICAL BOWEL NUTRITION STATUS      Continent Diet(Diet: Regular )  AMBULATORY STATUS COMMUNICATION OF NEEDS Skin   Extensive Assist Verbally Surgical wounds(Incision: Left Knee. )                       Personal Care Assistance Level of Assistance  Bathing, Feeding, Dressing Bathing Assistance: Limited assistance Feeding assistance: Independent Dressing Assistance: Limited assistance     Functional  Limitations Info  Sight, Hearing, Speech Sight Info: Adequate Hearing Info: Adequate Speech Info: Adequate    SPECIAL CARE FACTORS FREQUENCY  PT (By licensed PT), OT (By licensed OT)     PT Frequency: (5) OT Frequency: (5)            Contractures      Additional Factors Info  Code Status, Allergies Code Status Info: (Full Code. ) Allergies Info: (Latex)           Current Medications (02/15/2018):  This is the current hospital active medication list Current Facility-Administered Medications  Medication Dose Route Frequency Provider Last Rate Last Dose  . 0.9 %  sodium chloride infusion   Intravenous Continuous Thornton Park, MD 75 mL/hr at 02/15/18 0900    . acetaminophen (TYLENOL) tablet 1,000 mg  1,000 mg Oral Q6H Thornton Park, MD   1,000 mg at 02/14/18 2245  . acetaminophen (TYLENOL) tablet 325-650 mg  325-650 mg Oral Q6H PRN Thornton Park, MD      . bisacodyl (DULCOLAX) EC tablet 10 mg  10 mg Oral Daily PRN Thornton Park, MD   10 mg at 02/14/18 1540  . docusate sodium (COLACE) capsule 100 mg  100 mg Oral BID Thornton Park, MD   100 mg at 02/15/18 0826  . enoxaparin (LOVENOX) injection 40 mg  40 mg Subcutaneous Q24H Thornton Park, MD   40 mg at 02/15/18 0829  . ferrous sulfate tablet 325 mg  325 mg Oral BID WC  Thornton Park, MD   325 mg at 02/15/18 4259  . furosemide (LASIX) tablet 20 mg  20 mg Oral Daily Thornton Park, MD   20 mg at 02/15/18 0827  . gabapentin (NEURONTIN) capsule 300 mg  300 mg Oral TID Thornton Park, MD   300 mg at 02/15/18 0825  . HYDROmorphone (DILAUDID) injection 0.5-1 mg  0.5-1 mg Intravenous Q4H PRN Thornton Park, MD      . lisinopril (PRINIVIL,ZESTRIL) tablet 20 mg  20 mg Oral Daily Thornton Park, MD      . magnesium citrate solution 1 Bottle  1 Bottle Oral Once PRN Thornton Park, MD      . menthol-cetylpyridinium (CEPACOL) lozenge 3 mg  1 lozenge Oral PRN Thornton Park, MD       Or  . phenol (CHLORASEPTIC)  mouth spray 1 spray  1 spray Mouth/Throat PRN Thornton Park, MD      . methocarbamol (ROBAXIN) tablet 500 mg  500 mg Oral Q6H PRN Thornton Park, MD   500 mg at 02/15/18 0825   Or  . methocarbamol (ROBAXIN) 500 mg in dextrose 5 % 50 mL IVPB  500 mg Intravenous Q6H PRN Thornton Park, MD      . metoCLOPramide (REGLAN) tablet 5-10 mg  5-10 mg Oral Q8H PRN Thornton Park, MD       Or  . metoCLOPramide (REGLAN) injection 5-10 mg  5-10 mg Intravenous Q8H PRN Thornton Park, MD      . metoprolol succinate (TOPROL-XL) 24 hr tablet 50 mg  50 mg Oral Daily Thornton Park, MD   Stopped at 02/14/18 1602  . mometasone-formoterol (DULERA) 200-5 MCG/ACT inhaler 2 puff  2 puff Inhalation BID Thornton Park, MD   2 puff at 02/15/18 0829  . ondansetron (ZOFRAN) tablet 4 mg  4 mg Oral Q6H PRN Thornton Park, MD       Or  . ondansetron [facility]) injection 4 mg  4 mg Intravenous Q6H PRN Thornton Park, MD      . oxyCODONE (Oxy IR/ROXICODONE) immediate release tablet 10-15 mg  10-15 mg Oral Q4H PRN Thornton Park, MD   10 mg at 02/14/18 1549  . oxyCODONE (Oxy IR/ROXICODONE) immediate release tablet 5-10 mg  5-10 mg Oral Q4H PRN Thornton Park, MD   5 mg at 02/15/18 0825  . polyethylene glycol (MIRALAX / GLYCOLAX) packet 17 g  17 g Oral Daily PRN Thornton Park, MD      . potassium chloride (K-DUR,KLOR-CON) CR tablet 10 mEq  10 mEq Oral Daily Thornton Park, MD   10 mEq at 02/15/18 0826  . rOPINIRole (REQUIP) tablet 0.5 mg  0.5 mg Oral QHS Thornton Park, MD   0.5 mg at 02/14/18 2023  . simvastatin (ZOCOR) tablet 20 mg  20 mg Oral q1800 Thornton Park, MD   20 mg at 02/14/18 1921  . SUMAtriptan (IMITREX) injection 6 mg  6 mg Subcutaneous Q2H PRN Thornton Park, MD      . tiotropium [facility]) inhalation capsule 18 mcg  18 mcg Inhalation Daily Thornton Park, MD   18 mcg at 02/15/18 0830  . traMADol (ULTRAM) tablet 50 mg  50 mg Oral Q6H Thornton Park, MD   50 mg at 02/14/18 2245  .  vitamin B-12 (CYANOCOBALAMIN) tablet 1,000 mcg  1,000 mcg Oral Daily Thornton Park, MD   1,000 mcg at 02/15/18 5638     Discharge Medications: Please see discharge summary for a list of discharge medications.  Relevant Imaging Results:  Relevant Lab Results:  Additional Information (SSN: [id number])  Monserrath Junio, Veronia Beets, LCSW

## 2018-02-15 NOTE — Progress Notes (Signed)
  Subjective:  POD #1 s/p left TKA.  Patient reports left knee pain as mild.  Patient's husband is at the bedside.  Phlebotomy is drawing her blood.  She did not have labs drawn this morning.  Patient has done well with physical therapy but had an episode of hypotension during therapy.  Objective:   VITALS:   Vitals:   02/15/18 0351 02/15/18 0400 02/15/18 0715 02/15/18 1136  BP: (!) 92/56 96/70 106/60 98/65  Pulse: 88  67 74  Resp:      Temp:   98.2 F (36.8 C) 98.2 F (36.8 C)  TempSrc:   Oral Oral  SpO2:   98% 96%  Weight:      Height:        PHYSICAL EXAM: Left lower extremity: Neurovascular intact Sensation intact distally Intact pulses distally Dorsiflexion/Plantar flexion intact Incision: dressing C/D/I Compartment soft  LABS  No results found for this or any previous visit (from the past 24 hour(s)).  Dg Knee Left Port  Result Date: 02/14/2018 CLINICAL DATA:  Total knee arthroplasty left EXAM: PORTABLE LEFT KNEE - 1-2 VIEW COMPARISON:  None. FINDINGS: Total knee replacement in satisfactory position alignment. No fracture or acute complication. Gas in the joint and surrounding soft tissues. IMPRESSION: Satisfactory total knee replacement. Electronically Signed   By: Franchot Gallo M.D.   On: 02/14/2018 13:26    Assessment/Plan: 1 Day Post-Op   Active Problems:   History of total knee arthroplasty, left   History of total knee arthroplasty  Doing well postop.  Her pain is controlled.  Continue with physical therapy.  She will begin Lovenox for DVT prophylaxis. She has completed 24 hours of postop antibiotics.  Her Foley catheter has been removed.   Thornton Park , MD 02/15/2018, 4:13 PM

## 2018-02-15 NOTE — Evaluation (Addendum)
Occupational Therapy Evaluation Patient Details Name: Hailey Farrell MRN: [id number] DOB: [birth year] Today's Date: 02/15/2018    History of Present Illness Pt. is a 67 y.o. female who was admitted to [facility] for a Left TKA. Pt. PMHx includes: Anemia, Arthritis, Breast CA, COPD, Dyspnea, HTN, Migraines, RTC surgery, Restless, Leg Syndrome.   Clinical Impression   Pt. Presents with weakness, limited activity tolerance, and limited functional mobility which limits her ability to complete basic ADL and IADL functioning. Pt. resides at home alone. Pt. was independent with ADLs, and IADL functioning: including meal preparation, and medication management. Pt. drove minimally since Left rotator cuff surgery. Pt. has a supportive husband who assists her as needed.  Pt. education was provided about A/E use for LE ADLs. Pt. could benefit from OT services for ADL training, A/E training, and pt. Education about home modification, and DME. Pt. plans to return home upon discharge with pt.'s husband to assist. Pt. could benefit from follow-up [place] services upon discharge at home.    Follow Up Recommendations  Home health OT    Equipment Recommendations       Recommendations for Other Services       Precautions / Restrictions Precautions Precautions: Fall;Knee Required Braces or Orthoses: Knee Immobilizer - Left Knee Immobilizer - Left: On at all times Restrictions Weight Bearing Restrictions: Yes LLE Weight Bearing: Weight bearing as tolerated      Mobility Bed Mobility Overal bed mobility: Modified Independent Bed Mobility: Supine to Sit     Supine to sit: Min guard      Transfers Overall transfer level: Modified independent Equipment used: Rolling walker (2 wheeled)             General transfer comment: Mobility per PT        ADL either performed or assessed with clinical judgement   ADL Overall ADL's : Needs assistance/impaired Eating/Feeding: Independent   Grooming:  Independent   Upper Body Bathing: Set up   Lower Body Bathing: Moderate assistance   Upper Body Dressing : Independent   Lower Body Dressing: Moderate assistance                 General ADL Comments: Pt. education was provided about A/E use for LE ADLs.     Vision Baseline Vision/History: Wears glasses Wears Glasses: At all times Patient Visual Report: No change from baseline       Perception     Praxis      Pertinent Vitals/Pain Pain Assessment: No/denies pain Pain Score: 5      Hand Dominance Right   Extremity/Trunk Assessment LUE shoulder limitations secondary to prior rotator cuff surgery.           Communication Communication Communication: No difficulties   Cognition Arousal/Alertness: Awake/alert Behavior During Therapy: WFL for tasks assessed/performed Overall Cognitive Status: Within Functional Limits for tasks assessed                                     General Comments       Exercises   Shoulder Instructions      Home Living Family/patient expects to be discharged to:: Private residence Living Arrangements: Spouse/significant other Available Help at Discharge: Family Type of Home: House Home Access: Stairs to enter Technical brewer of Steps: 1 Entrance Stairs-Rails: Right Home Layout: One level     Bathroom Shower/Tub: Teacher, early years/pre: Standard Bathroom Accessibility: Yes  Home Equipment: Walker - standard;Cane - single point          Prior Functioning/Environment Level of Independence: Independent with assistive device(s)        Comments: Pt. was independent with ADLs, and ADLs, minimal driving. Pt. husband assists pt. as needed.        OT Problem List: Decreased strength;Decreased activity tolerance;Decreased knowledge of use of DME or AE      OT Treatment/Interventions: Self-care/ADL training;Therapeutic activities;Therapeutic exercise;Patient/family education;Energy  conservation;DME and/or AE instruction    OT Goals(Current goals can be found in the care plan section) Acute Rehab OT Goals Patient Stated Goal: To return home OT Goal Formulation: With patient Potential to Achieve Goals: Good  OT Frequency: Min 2X/week   Barriers to D/C:            Co-evaluation              AM-PAC PT "6 Clicks" Daily Activity     Outcome Measure Help from another person eating meals?: None Help from another person taking care of personal grooming?: None Help from another person toileting, which includes using toliet, bedpan, or urinal?: A Little Help from another person bathing (including washing, rinsing, drying)?: A Lot Help from another person to put on and taking off regular upper body clothing?: None Help from another person to put on and taking off regular lower body clothing?: A Lot 6 Click Score: 19   End of Session    Activity Tolerance: Patient tolerated treatment well Patient left: in bed  OT Visit Diagnosis: Muscle weakness (generalized) (M62.81)                Time: 1010-1032 OT Time Calculation (min): 22 min Charges:  OT General Charges $OT Visit: 1 Visit OT Evaluation $OT Eval Low Complexity: 1 Low  Harrel Carina, MS, OTR/L   Harrel Carina 02/15/2018, 12:06 PM

## 2018-02-15 NOTE — Progress Notes (Signed)
Physical Therapy Treatment Patient Details Name: Hailey Farrell MRN: [id number] DOB: 17-Dec-1950 Today's Date: 02/15/2018    History of Present Illness 67 y/o female s/p L TKA 02/14/18.    PT Comments    Pt showed great effort with session and was eager to do the exercises and to try walking today.  She was able to participate well with exercises and though she was not able to do SLRs consistently/independently she showed significantly improved strength and ROM.  KI donned during ambulation where she was able to do ~40 ft but had some dizziness with the effort (non-symptomatic during sitting and initial standing) post session BP taken: 103/65.  Once medical/BP issues clear pt should be able to go home with HHPT.    Follow Up Recommendations  Home health PT     Equipment Recommendations  Rolling walker with 5" wheels;3in1 (PT)    Recommendations for Other Services       Precautions / Restrictions Precautions Precautions: Fall;Knee Restrictions LLE Weight Bearing: Weight bearing as tolerated    Mobility  Bed Mobility Overal bed mobility: Modified Independent Bed Mobility: Supine to Sit     Supine to sit: Min guard     General bed mobility comments: Pt with slow and labored effort to EOB, but able to rise using rail  Transfers Overall transfer level: Modified independent Equipment used: Rolling walker (2 wheeled)             General transfer comment: Pt was able to rise to standing with heavy UE use, but no direct phyiscal assist  Ambulation/Gait Ambulation/Gait assistance: Min guard Gait Distance (Feet): 40 Feet Assistive device: Rolling walker (2 wheeled)(KI donned)       General Gait Details: Pt was able to ambulate with slow and cautious gait and heavy reliance on the walker but overall showed good step length and slow but even cadence.  She did have some dizziness part-way through and could not have tolerated more than she was able to manage this  AM.   Stairs             Wheelchair Mobility    Modified Rankin (Stroke Patients Only)       Balance Overall balance assessment: Needs assistance Sitting-balance support: Bilateral upper extremity supported;Feet supported Sitting balance-Leahy Scale: Fair                                      Cognition Arousal/Alertness: Awake/alert Behavior During Therapy: WFL for tasks assessed/performed Overall Cognitive Status: Within Functional Limits for tasks assessed                                        Exercises Total Joint Exercises Ankle Circles/Pumps: AROM;10 reps Quad Sets: Strengthening;10 reps Short Arc Quad: Strengthening;10 reps Heel Slides: 5 reps;10 reps;AROM Hip ABduction/ADduction: Strengthening;10 reps Straight Leg Raises: AAROM;15 reps(Pt able to struggle with a few partial lifts w/o assist) Knee Flexion: PROM Goniometric ROM: 1-84    General Comments        Pertinent Vitals/Pain Pain Assessment: 0-10 Pain Score: 5     Home Living                      Prior Function            PT Goals (current goals  can now be found in the care plan section) Progress towards PT goals: Progressing toward goals    Frequency    BID      PT Plan Current plan remains appropriate    Co-evaluation              AM-PAC PT "6 Clicks" Daily Activity  Outcome Measure  Difficulty turning over in bed (including adjusting bedclothes, sheets and blankets)?: A Little Difficulty moving from lying on back to sitting on the side of the bed? : A Little Difficulty sitting down on and standing up from a chair with arms (e.g., wheelchair, bedside commode, etc,.)?: A Lot Help needed moving to and from a bed to chair (including a wheelchair)?: None Help needed walking in hospital room?: A Little Help needed climbing 3-5 steps with a railing? : A Lot 6 Click Score: 17    End of Session Equipment Utilized During Treatment:  Gait belt Activity Tolerance: Patient limited by pain Patient left: with bed alarm set;with call bell/phone within reach;with family/visitor present   PT Visit Diagnosis: Muscle weakness (generalized) (M62.81);Difficulty in walking, not elsewhere classified (R26.2)     Time: 4665-9935 PT Time Calculation (min) (ACUTE ONLY): 41 min  Charges:  $Gait Training: 8-22 mins $Therapeutic Exercise: 23-37 mins                     Kreg Shropshire, DPT 02/15/2018, 11:40 AM

## 2018-02-15 NOTE — Care Management Note (Signed)
Case Management Note  Patient Details  Name: Hailey Farrell MRN: [id number] Date of Birth: [birth year]  Subjective/Objective:  Met with patient's spouse, Fritz Pickerel, at bedside. Patient was sleeping in the chair and her spouse didn't want her woke up. Code 44 given and explained.  Provided a list of home health agencies. Sp[facility] chose [facility] for HHPT. She will need a walker and bedside commode. Ordered from [place] with [facility]. Case discussed with Dr. Mack Guise. Called Lovenox 40 mg # 30, [facility] [place].                   Action/Plan: [facility] for HHPT, Lovenox called in. DME with [facility].   Expected Discharge Date:  02/17/18               Expected Discharge Plan:  [place]  In-House Referral:     Discharge planning Services  CM Consult  Post Acute Care Choice:  Durable Medical Equipment, Home Health Choice offered to:  Patient  DME Arranged:  Bedside commode, Walker rolling DME Agency:  [place]:  PT [facility] Agency:  [place]  Status of Service:  In process, will continue to follow  If discussed at Long Length of Stay Meetings, dates discussed:    Additional Comments:  Jolly Mango, RN 02/15/2018, 3:40 PM

## 2018-02-15 NOTE — Clinical Social Work Note (Signed)
Clinical Social Work Assessment  Patient Details  Name: Hailey Farrell MRN: [id number] Date of Birth: Oct 10, 1950  Date of referral:  02/15/18               Reason for consult:  Facility Placement                Permission sought to share information with:    Permission granted to share information::     Name::        Agency::     Relationship::     Contact Information:     Housing/Transportation Living arrangements for the past 2 months:  Single Family Home Source of Information:  Patient, Spouse Patient Interpreter Needed:  None Criminal Activity/Legal Involvement Pertinent to Current Situation/Hospitalization:  No - Comment as needed Significant Relationships:  Spouse Lives with:  Spouse Do you feel safe going back to the place where you live?  Yes Need for family participation in patient care:  Yes (Comment)  Care giving concerns:  Patient lives in [place] with her husband Fritz Pickerel.    Social Worker assessment / plan:  Holiday representative (CSW) reviewed chart and noted that on post op day 0 PT recommended SNF. Per PT patient did better today and recommendation is now home health. CSW met with patient and her husband Fritz Pickerel was at bedside. Patient was alert and oriented X4 and was sitting up in the chair at bedside. Per patient she is happy with her progress with PT because she made it to the door this morning. Patient remembered CSW from joint class. CSW introduced self and explained role of CSW department. Per patient she lives in [place] with her husband and wants to go home. CSW explained that PT was recommending SNF yesterday however the recommendation changed to home health today. Patient reported that feels confident in going home and does not need SNF. Per patient she has her husband for support. Patient requested home health, walker and high toilet seat. RN case manager aware of above. Please reconsult if future social work needs arise. CSW signing off.   Employment  status:  Retired Nurse, adult PT Recommendations:  Home with [employer], [place] / Referral to community resources:  Other (Comment Required)(Home Health )  Patient/Family's Response to care:  Patient is agreeable to D/C home with home health.   Patient/Family's Understanding of and Emotional Response to Diagnosis, Current Treatment, and Prognosis:  Patient and her husband Fritz Pickerel were very pleasant and thanked CSW for visit.   Emotional Assessment Appearance:  Appears stated age Attitude/Demeanor/Rapport:    Affect (typically observed):  Accepting, Adaptable, Pleasant Orientation:  Oriented to Self, Oriented to Place, Oriented to Situation, Oriented to  Time Alcohol / Substance use:  Not Applicable Psych involvement (Current and /or in the community):  No (Comment)  Discharge Needs  Concerns to be addressed:  Discharge Planning Concerns Readmission within the last 30 days:  No Current discharge risk:  None Barriers to Discharge:  Continued Medical Work up   [employer], Veronia Beets, LCSW 02/15/2018, 11:37 AM

## 2018-02-15 NOTE — Progress Notes (Signed)
Physical Therapy Treatment Patient Details Name: Hailey Farrell MRN: [id number] DOB: Sep 09, 1950 Today's Date: 02/15/2018    History of Present Illness Pt. is a 67 y.o. female who was admitted to [facility] for a Left TKA. Pt. PMHx includes: Anemia, Arthritis, Breast CA, COPD, Dyspnea, HTN, Migraines, RTC surgery, Restless, Leg Syndrome.    PT Comments    Pt eager to complete exercises and work in therapy today. BP taken prior to treatment in sitting and measured 97/53, thus ambulation was held this date. Pt feeling lightheaded and dizzy upon initial standing and required guarded assistance to ensure safe return to bed. BP in supine upon return to bed 107/63. Limited mobility due to symptoms but she was able to participate in supine exercises with progression of repetitions. Per improved and stabilized medical status pt should be able to return home with HHPT.   Follow Up Recommendations  Home health PT     Equipment Recommendations  Rolling walker with 5" wheels;3in1 (PT)    Recommendations for Other Services       Precautions / Restrictions Precautions Precautions: Fall;Knee Required Braces or Orthoses: Knee Immobilizer - Left Knee Immobilizer - Left: On when out of bed or walking Restrictions Weight Bearing Restrictions: Yes LLE Weight Bearing: Weight bearing as tolerated    Mobility  Bed Mobility Overal bed mobility: Modified Independent Bed Mobility: Sit to Supine       Sit to supine: Mod assist   General bed mobility comments: Pt with slow and labored effort to supine from seated, required mod assist for trunk, required PT to lift L LE to bed, able to scoot and position with rail  Transfers Overall transfer level: Modified independent Equipment used: Rolling walker (2 wheeled)             General transfer comment: BP before transfer 97/53. Transfer chair to bed slow, pt feeling lightheaded and dizzy, secondary to symptoms 2 therapists required to quickly assist pt  back to bed, requiring cuing to position feet and body appropriately  Ambulation/Gait             General Gait Details: Pt unable to ambulate due to low BP reading and symptoms during standing.   Stairs             Wheelchair Mobility    Modified Rankin (Stroke Patients Only)       Balance Overall balance assessment: Needs assistance Sitting-balance support: Bilateral upper extremity supported;Feet supported Sitting balance-Leahy Scale: Fair                                      Cognition                                              Exercises Total Joint Exercises Ankle Circles/Pumps: AROM;Strengthening;Left;15 reps Quad Sets: AROM;Strengthening;Left;15 reps Short Arc Quad: AROM;Left;15 reps Heel Slides: AROM;Strengthening;Left;15 reps(resisted extension) Hip ABduction/ADduction: AROM;Strengthening;Left;10 reps Straight Leg Raises: AAROM;Left;10 reps Goniometric ROM: 1-93    General Comments  Issued paper HEP and reviewed exercises for pt to do independently in room      Pertinent Vitals/Pain Pain Assessment: 0-10 Pain Score: 4  Pain Intervention(s): Repositioned;Ice applied    Home Living  Prior Function            PT Goals (current goals can now be found in the care plan section) Acute Rehab PT Goals Patient Stated Goal: To return home Progress towards PT goals: Progressing toward goals    Frequency    BID      PT Plan Current plan remains appropriate    Co-evaluation              AM-PAC PT "6 Clicks" Daily Activity  Outcome Measure  Difficulty turning over in bed (including adjusting bedclothes, sheets and blankets)?: A Little Difficulty moving from lying on back to sitting on the side of the bed? : A Lot Difficulty sitting down on and standing up from a chair with arms (e.g., wheelchair, bedside commode, etc,.)?: A Lot Help needed moving to and from a bed to  chair (including a wheelchair)?: A Lot Help needed walking in hospital room?: Total Help needed climbing 3-5 steps with a railing? : Total 6 Click Score: 11    End of Session Equipment Utilized During Treatment: Gait belt;Left knee immobilizer Activity Tolerance: Treatment limited secondary to medical complications (Comment) Patient left: with bed alarm set;with call bell/phone within reach;with family/visitor present Nurse Communication: Mobility status PT Visit Diagnosis: Muscle weakness (generalized) (M62.81);Difficulty in walking, not elsewhere classified (R26.2)     Time: 5615-3794 PT Time Calculation (min) (ACUTE ONLY): 42 min  Charges:                        Ernie Avena, SPT 02/15/2018, 4:03 PM

## 2018-02-16 DIAGNOSIS — M1712 Unilateral primary osteoarthritis, left knee: Secondary | ICD-10-CM | POA: Diagnosis not present

## 2018-02-16 LAB — BASIC METABOLIC PANEL
ANION GAP: 6 (ref 5–15)
BUN: 16 mg/dL (ref 8–23)
CO2: 26 mmol/L (ref 22–32)
Calcium: 8.7 mg/dL — ABNORMAL LOW (ref 8.9–10.3)
Chloride: 108 mmol/L (ref 98–111)
Creatinine, Ser: 0.8 mg/dL (ref 0.44–1.00)
GFR calc Af Amer: >60 mL/min (ref >60)
GFR calc non Af Amer: >60 mL/min (ref >60)
GLUCOSE: 106 mg/dL — AB (ref 70–99)
POTASSIUM: 4 mmol/L (ref 3.5–5.1)
Sodium: 140 mmol/L (ref 135–145)

## 2018-02-16 LAB — CBC
HCT: 25.9 % — ABNORMAL LOW (ref 35.0–47.0)
Hemoglobin: 8.7 g/dL — ABNORMAL LOW (ref 12.0–16.0)
MCH: 28.3 pg (ref 26.0–34.0)
MCHC: 33.8 g/dL (ref 32.0–36.0)
MCV: 83.7 fL (ref 80.0–100.0)
Platelets: 173 10*3/uL (ref 150–440)
RBC: 3.09 MIL/uL — ABNORMAL LOW (ref 3.80–5.20)
RDW: 16 % — AB (ref 11.5–14.5)
WBC: 7.4 10*3/uL (ref 3.6–11.0)

## 2018-02-16 MED ORDER — SODIUM CHLORIDE 0.9 % IV BOLUS
500.0000 mL | Freq: Once | INTRAVENOUS | Status: AC
  Administered 2018-02-16: 500 mL via INTRAVENOUS

## 2018-02-16 NOTE — Progress Notes (Signed)
Physical Therapy Treatment Patient Details Name: Hailey Farrell MRN: [id number] DOB: 01-22-1951 Today's Date: 02/16/2018    History of Present Illness Pt. is a 67 y.o. female who was admitted to [facility] for a Left TKA. Pt. PMHx includes: Anemia, Arthritis, Breast CA, COPD, Dyspnea, HTN, Migraines, RTC surgery, Restless, Leg Syndrome.    PT Comments    Participated in exercises as described below.  To edge of bed with min assist.  Some dizziness upon sitting which cleared with time.  Again c/o dizziness upon standing but relieved with time.  She was able to ambulate around nursing station x 1 with min guard/supervision +2 for safety and wc follow.  Overall tolerated well with no LOB or buckling.  Husband in for session and reviewed safety concerns regarding BP.  Both voiced understanding.  Pt has one small threshold step to navigate.  Demonstrated and she voiced understanding.  BP supine 123/63 Standing 109/79 After gait 136/76 P 103    Follow Up Recommendations  Home health PT     Equipment Recommendations  Rolling walker with 5" wheels;3in1 (PT)    Recommendations for Other Services       Precautions / Restrictions Precautions Precautions: Fall;Knee Required Braces or Orthoses: Knee Immobilizer - Left Knee Immobilizer - Left: On when out of bed or walking Restrictions Weight Bearing Restrictions: Yes LLE Weight Bearing: Weight bearing as tolerated    Mobility  Bed Mobility Overal bed mobility: Needs Assistance Bed Mobility: Supine to Sit   Sidelying to sit: Min assist Supine to sit: Min guard Sit to supine: Min assist   General bed mobility comments: for LE management  Transfers Overall transfer level: Needs assistance Equipment used: Rolling walker (2 wheeled) Transfers: Sit to/from Stand Sit to Stand: Min assist            Ambulation/Gait Ambulation/Gait assistance: Min guard Gait Distance (Feet): 160 Feet Assistive device: Rolling walker (2 wheeled) Gait  Pattern/deviations: Step-to pattern   Gait velocity interpretation: 1.31 - 2.62 ft/sec, indicative of limited community Metallurgist Rankin (Stroke Patients Only)       Balance Overall balance assessment: Needs assistance Sitting-balance support: Bilateral upper extremity supported;Feet supported Sitting balance-Leahy Scale: Normal     Standing balance support: Bilateral upper extremity supported Standing balance-Leahy Scale: Fair Standing balance comment: with FWW, CGA-MIN A for dyanmic standing, Supervision for static standing                            Cognition Arousal/Alertness: Awake/alert Behavior During Therapy: WFL for tasks assessed/performed Overall Cognitive Status: Within Functional Limits for tasks assessed                                        Exercises Total Joint Exercises Ankle Circles/Pumps: AROM;Strengthening;Left;15 reps Quad Sets: AROM;Strengthening;Left;15 reps Gluteal Sets: Strengthening;10 reps Short Arc Quad: AROM;Left;15 reps Heel Slides: AROM;Strengthening;Left;15 reps Straight Leg Raises: AAROM;Left;10 reps Knee Flexion: PROM Goniometric ROM: 1-80 - limited by bulky dressing Other Exercises Other Exercises: OT re-enforced use of AE for LB dressing with pt confirming 75% understanding, will require carryover upon d/c.  Other Exercises: OT re-enforced polarcare management with pt verbalizing understanding.  Other Exercises: OT re-enfroced safety with FWW including hand placement for sit<>stand  with pt demonstrating 100% carryover.     General Comments        Pertinent Vitals/Pain Pain Assessment: 0-10 Pain Score: 3  Pain Location: L hip and knee with movement Pain Descriptors / Indicators: Aching Pain Intervention(s): Limited activity within patient's tolerance;Ice applied;Monitored during session;Premedicated before session    Home Living                       Prior Function            PT Goals (current goals can now be found in the care plan section) Acute Rehab PT Goals Patient Stated Goal: To return home Progress towards PT goals: Progressing toward goals    Frequency    BID      PT Plan Current plan remains appropriate    Co-evaluation              AM-PAC PT "6 Clicks" Daily Activity  Outcome Measure  Difficulty turning over in bed (including adjusting bedclothes, sheets and blankets)?: None Difficulty moving from lying on back to sitting on the side of the bed? : A Little Difficulty sitting down on and standing up from a chair with arms (e.g., wheelchair, bedside commode, etc,.)?: A Little Help needed moving to and from a bed to chair (including a wheelchair)?: A Little Help needed walking in hospital room?: A Little Help needed climbing 3-5 steps with a railing? : A Little 6 Click Score: 19    End of Session Equipment Utilized During Treatment: Gait belt;Left knee immobilizer Activity Tolerance: Patient tolerated treatment well Patient left: in chair;with chair alarm set;with call bell/phone within reach;with family/visitor present Nurse Communication: Mobility status       Time: 1030-1057 PT Time Calculation (min) (ACUTE ONLY): 27 min  Charges:  $Gait Training: 8-22 mins $Therapeutic Exercise: 8-22 mins                     Chesley Noon, PTA 02/16/18, 11:29 AM'

## 2018-02-16 NOTE — Plan of Care (Signed)
  Problem: Education: Goal: Knowledge of General Education information will improve Description Including pain rating scale, medication(s)/side effects and non-pharmacologic comfort measures Outcome: Progressing   

## 2018-02-16 NOTE — Progress Notes (Signed)
Occupational Therapy Treatment Patient Details Name: Hailey Farrell MRN: [id number] DOB: 1950/10/29 Today's Date: 02/16/2018    History of present illness Pt. is a 67 y.o. female who was admitted to [facility] for a Left TKA. Pt. PMHx includes: Anemia, Arthritis, Breast CA, COPD, Dyspnea, HTN, Migraines, RTC surgery, Restless, Leg Syndrome.   OT comments  Pt tolerated OT treatment will this date. She had some c/o pain in L hip and knee with movement. Pt performs sup to sit with MOD I, and sit <> stand with FWW with CGA-SUP. Pt requires MIN verbal cues for safe hand placement. Pt tolerated fxl mobility with FWW ~10-85ft within room to increase confidence with household distances requiring one verbal cue for hazard avoidance. Pt t/f'ed to [facility] with CGA-SUP with one cue to reach back and "kick-out" L LE. Pt required MIN A to t/f back into bed d/t difficulty lifting post-op LE. Education provided by OT re: polar care mgt and use of AE PRN for LB dressing with pt verbalizing understanding and requiring MIN cueing for sequence with AE. Pt remains appropriate for HHOT as she will have assistance from her husband upon d/c.    Follow Up Recommendations  Home health OT    Equipment Recommendations       Recommendations for Other Services      Precautions / Restrictions Precautions Precautions: Fall;Knee Required Braces or Orthoses: Knee Immobilizer - Left Knee Immobilizer - Left: On when out of bed or walking Restrictions Weight Bearing Restrictions: Yes LLE Weight Bearing: Weight bearing as tolerated       Mobility Bed Mobility Overal bed mobility: Modified Independent Bed Mobility: Sit to Supine     Supine to sit: Min guard Sit to supine: Min assist   General bed mobility comments: Pt required MIN A to elevate L LE to bed and laterally move to the left. Pt with c/o pain during this task, required use of trapeze to reposition towards [facility], educated on alternative techniques more fxl for home  setup to propel towards [place].   Transfers Overall transfer level: Modified independent Equipment used: Rolling walker (2 wheeled)                  Balance Overall balance assessment: Needs assistance Sitting-balance support: Bilateral upper extremity supported;Feet supported Sitting balance-Leahy Scale: Normal     Standing balance support: Bilateral upper extremity supported Standing balance-Leahy Scale: Fair Standing balance comment: with FWW, CGA-MIN A for dyanmic standing, Supervision for static standing                           ADL either performed or assessed with clinical judgement   ADL Overall ADL's : Needs assistance/impaired                     Lower Body Dressing: Minimal assistance;Moderate assistance;With adaptive equipment;Cueing for safety;Sitting/lateral leans   Toilet Transfer: Min IT trainer Details (indicate cue type and reason): BSC positioned ~19ft away Toileting- Clothing Manipulation and Hygiene: Supervision/safety         General ADL Comments: LE AE use re-enforced. Pt with 75% demonstrated/verbalized understanding.      Vision Baseline Vision/History: Wears glasses Wears Glasses: At all times Patient Visual Report: No change from baseline     Perception     Praxis      Cognition Arousal/Alertness: Awake/alert Behavior During Therapy: WFL for tasks assessed/performed Overall Cognitive Status: Within Functional Limits for tasks assessed  Exercises Other Exercises Other Exercises: OT re-enforced use of AE for LB dressing with pt confirming 75% understanding, will require carryover upon d/c.  Other Exercises: OT re-enforced polarcare management with pt verbalizing understanding.  Other Exercises: OT re-enfroced safety with FWW including hand placement for sit<>stand with pt demonstrating 100% carryover.    Shoulder Instructions        General Comments      Pertinent Vitals/ Pain       Pain Score: 3  Pain Location: L hip and knee with movement Pain Descriptors / Indicators: Aching Pain Intervention(s): Limited activity within patient's tolerance;Monitored during session  Home Living                                          Prior Functioning/Environment              Frequency  Min 2X/week        Progress Toward Goals  OT Goals(current goals can now be found in the care plan section)  Progress towards OT goals: Progressing toward goals  Acute Rehab OT Goals Patient Stated Goal: To return home OT Goal Formulation: With patient Potential to Achieve Goals: Good  Plan Discharge plan remains appropriate    Co-evaluation                 AM-PAC PT "6 Clicks" Daily Activity     Outcome Measure   Help from another person eating meals?: None Help from another person taking care of personal grooming?: None Help from another person toileting, which includes using toliet, bedpan, or urinal?: A Little Help from another person bathing (including washing, rinsing, drying)?: A Little Help from another person to put on and taking off regular upper body clothing?: None Help from another person to put on and taking off regular lower body clothing?: A Lot 6 Click Score: 20    End of Session Equipment Utilized During Treatment: Gait belt;Rolling walker;Left knee immobilizer  OT Visit Diagnosis: Muscle weakness (generalized) (M62.81)   Activity Tolerance Patient tolerated treatment well   Patient Left in bed;with bed alarm set;with SCD's reapplied;with call bell/phone within reach   Nurse Communication          Time: 0630-1601 OT Time Calculation (min): 31 min  Charges: OT Treatments $Self Care/Home Management : 23-37 mins  Gerrianne Scale, MS, OTR/L ascom [phone] or [phone] 02/16/18, 8:25 AM

## 2018-02-16 NOTE — Progress Notes (Signed)
I have seen the pt, for Hypotension.  Plan- Stopped Lisinopril. SPoke to pt and her husband in detail- Not to take lisinopril for next 4-5 days until on Pain meds.Check BP twice daily, if BP runs > 014 systolic- then start taking lisinopril again otherwise follow with PMD in 1 week and discuss with him about BP meds adjustment.  She can be discharged today, if Ortho is planning for that. Full consult note to follow.

## 2018-02-16 NOTE — Progress Notes (Signed)
Pt's BP=89/65. Pt asymptomatic, requesting for Tramadol for pain. Dr. Mack Guise paged, said okay to give Tramadol this time and placed an order for NS 534ml bolus, hospitalist consult for hypotension. Will administer Tramadol, NS bolus as ordered and continue to monitor.

## 2018-02-16 NOTE — Progress Notes (Signed)
Physical Therapy Treatment Patient Details Name: Hailey Farrell MRN: [id number] DOB: [birth year] Today's Date: 02/16/2018    History of Present Illness Pt. is a 67 y.o. female who was admitted to [facility] for a Left TKA. Pt. PMHx includes: Anemia, Arthritis, Breast CA, COPD, Dyspnea, HTN, Migraines, RTC surgery, Restless, Leg Syndrome.    PT Comments    Patient demonstrated progress in confidence with transfers and there ex this afternoon though she is apprehensive due to reported higher pain level.  Pt required VC's to stand from chair for setup and body mechanics but was able to rise on her own once assisted with foot placement.  She is still wearing knee immobilizer for standing and transfers.  Pt able to perform bed mobility with min A to bring LE's over EOB but able to scoot up in bed without assistance.  She demonstrated knowledge of there ex with need for manual assistance with seated hip abduction due to pt reported pain. PT provided education regarding importance of frequent movement and there ex for the healing process and pt expressed understanding.   Pt will continue to benefit from skilled PT with focus on strength, knee ROM, safe functional mobility and pain management.  Follow Up Recommendations  Home health PT     Equipment Recommendations  Rolling walker with 5" wheels;3in1 (PT)    Recommendations for Other Services       Precautions / Restrictions Precautions Precautions: Fall;Knee Required Braces or Orthoses: Knee Immobilizer - Left Knee Immobilizer - Left: On when out of bed or walking Restrictions Weight Bearing Restrictions: Yes LLE Weight Bearing: Weight bearing as tolerated    Mobility  Bed Mobility Overal bed mobility: Needs Assistance Bed Mobility: Sit to Supine       Sit to supine: Min assist   General bed mobility comments: Some assistance to bring LE over EOB.  Able to scoot up in bed with HOB flattened.  Transfers Overall transfer level: Needs  assistance Equipment used: Rolling walker (2 wheeled) Transfers: Sit to/from [facility] Sit to Stand: Min assist Stand pivot transfers: Min guard       General transfer comment: Pt able rise on her own but requires VC's for setup and sequencing.  PT provided VC's for body mechanics as pt is still using knee immobilizer for standing.  PT involved husband in transfer and he expressed understanding.  Ambulation/Gait                 Stairs             Wheelchair Mobility    Modified Rankin (Stroke Patients Only)       Balance Overall balance assessment: Needs assistance Sitting-balance support: Bilateral upper extremity supported;Feet supported Sitting balance-Leahy Scale: Normal     Standing balance support: Bilateral upper extremity supported Standing balance-Leahy Scale: Fair                              Cognition Arousal/Alertness: Awake/alert Behavior During Therapy: WFL for tasks assessed/performed Overall Cognitive Status: Within Functional Limits for tasks assessed                                 General Comments: Follows commands consistently; apprehensive due to pain.      Exercises Total Joint Exercises Quad Sets: Left;Strengthening;10 reps;Seated Hip ABduction/ADduction: AAROM;10 reps;Seated    General Comments  Pertinent Vitals/Pain Pain Assessment: 0-10 Pain Score: 5  Pain Location: L knee; also reports some R knee pain with future TKA in mind. Pain Intervention(s): Monitored during session;Limited activity within patient's tolerance    Home Living                      Prior Function            PT Goals (current goals can now be found in the care plan section) Acute Rehab PT Goals Patient Stated Goal: To go home and resume daily activity. PT Goal Formulation: With patient Time For Goal Achievement: 03/02/18 Potential to Achieve Goals: Good Progress towards PT goals:  Progressing toward goals    Frequency    BID      PT Plan Current plan remains appropriate    Co-evaluation              AM-PAC PT "6 Clicks" Daily Activity  Outcome Measure  Difficulty turning over in bed (including adjusting bedclothes, sheets and blankets)?: None Difficulty moving from lying on back to sitting on the side of the bed? : A Little Difficulty sitting down on and standing up from a chair with arms (e.g., wheelchair, bedside commode, etc,.)?: A Little Help needed moving to and from a bed to chair (including a wheelchair)?: A Little Help needed walking in hospital room?: A Little Help needed climbing 3-5 steps with a railing? : A Little 6 Click Score: 19    End of Session Equipment Utilized During Treatment: Gait belt;Left knee immobilizer Activity Tolerance: Patient tolerated treatment well Patient left: in bed;with bed alarm set;with family/visitor present;with call bell/phone within reach Nurse Communication: Mobility status       Time: 0109-3235 PT Time Calculation (min) (ACUTE ONLY): 25 min  Charges:  $Therapeutic Exercise: 8-22 mins $Therapeutic Activity: 8-22 mins                     Roxanne Gates, PT, DPT    Roxanne Gates 02/16/2018, 3:52 PM

## 2018-02-16 NOTE — Consult Note (Signed)
[facility] NAME: Hailey Farrell    MR#:  [id number]  DATE OF BIRTH:  January 19, 1951  DATE OF ADMISSION:  02/14/2018  PRIMARY CARE PHYSICIAN: Letta Median, MD   REQUESTING/REFERRING PHYSICIAN: Lisette Grinder  CHIEF COMPLAINT:  Hypotension  HISTORY OF PRESENT ILLNESS: Hailey Farrell  is a 67 y.o. female with a known history of arthritis, breast cancer, COPD, migraine, restless leg syndrome-admitted to orthopedic services for elective left knee replacement surgery.  Postop day 2.  Her blood pressure was running on the lower side since yesterday, so hospitalist consult was called in. Patient does not have any signs of infection and she denies any complaints but feels dizzy with low running blood pressure.   PAST MEDICAL HISTORY:   Past Medical History:  Diagnosis Date  . Anemia   . Arthritis   . Breast cancer ([place]) 1990   Right  . COPD (chronic obstructive pulmonary disease) ([place])   . Dyspnea    DOE  . Elevated lipids   . Hypertension   . Lower extremity edema   . Migraines   . Migraines   . Restless leg syndrome   . Rotator cuff injury     PAST SURGICAL HISTORY:  Past Surgical History:  Procedure Laterality Date  . ABDOMINAL HYSTERECTOMY    . BACK SURGERY  08/2017   CERVICAL FUSION  . BREAST BIOPSY Right 1990   LUMPECTOMY.  took lymph nodes  . COLONOSCOPY WITH PROPOFOL N/A 07/20/2017   Procedure: COLONOSCOPY WITH PROPOFOL;  Surgeon: Virgel Manifold, MD;  Location: [facility] ENDOSCOPY;  Service: Endoscopy;  Laterality: N/A;  . ENTEROSCOPY N/A 08/08/2017   Procedure: ENTEROSCOPY;  Surgeon: Lin Landsman, MD;  Location: [facility] ENDOSCOPY;  Service: Gastroenterology;  Laterality: N/A;  . ESOPHAGOGASTRODUODENOSCOPY Left 04/13/2017   Procedure: ESOPHAGOGASTRODUODENOSCOPY (EGD);  Surgeon: Virgel Manifold, MD;  Location: [facility] ENDOSCOPY;  Service: Endoscopy;  Laterality: Left;  . ESOPHAGOGASTRODUODENOSCOPY (EGD) WITH PROPOFOL N/A  07/20/2017   Procedure: ESOPHAGOGASTRODUODENOSCOPY (EGD) WITH PROPOFOL;  Surgeon: Virgel Manifold, MD;  Location: [facility] ENDOSCOPY;  Service: Endoscopy;  Laterality: N/A;  . ESOPHAGOGASTRODUODENOSCOPY (EGD) WITH PROPOFOL N/A 12/12/2017   Procedure: ESOPHAGOGASTRODUODENOSCOPY (EGD) WITH PROPOFOL;  Surgeon: Virgel Manifold, MD;  Location: [facility] ENDOSCOPY;  Service: Endoscopy;  Laterality: N/A;  . MEDIAL PARTIAL KNEE REPLACEMENT Left 1990   torn ligament. no knee replacement at that time  . PARTIAL HYSTERECTOMY    . SHOULDER ARTHROSCOPY WITH OPEN ROTATOR CUFF REPAIR Left 03/01/2016   Procedure: SHOULDER ARTHROSCOPY WITH OPEN ROTATOR CUFF REPAIR;  Surgeon: Thornton Park, MD;  Location: [facility] ORS;  Service: Orthopedics;  Laterality: Left;  . TOTAL KNEE ARTHROPLASTY Left 02/14/2018   Procedure: TOTAL KNEE ARTHROPLASTY;  Surgeon: Thornton Park, MD;  Location: [facility] ORS;  Service: Orthopedics;  Laterality: Left;    SOCIAL HISTORY:  Social History   Tobacco Use  . Smoking status: Former Smoker    Packs/day: 0.10    Years: 50.00    Pack years: 5.00    Types: Cigarettes    Last attempt to quit: 03/13/2016    Years since quitting: 1.9  . Smokeless tobacco: Never Used  Substance Use Topics  . Alcohol use: No    FAMILY HISTORY:  Family History  Problem Relation Age of Onset  . CAD Mother   . CAD Sister     DRUG ALLERGIES:  Allergies  Allergen Reactions  . Latex Rash    Welts over body    REVIEW  OF SYSTEMS:   CONSTITUTIONAL: No fever, fatigue or weakness.  EYES: No blurred or double vision.  EARS, NOSE, AND THROAT: No tinnitus or ear pain.  RESPIRATORY: No cough, shortness of breath, wheezing or hemoptysis.  CARDIOVASCULAR: No chest pain, orthopnea, edema.  GASTROINTESTINAL: No nausea, vomiting, diarrhea or abdominal pain.  GENITOURINARY: No dysuria, hematuria.  ENDOCRINE: No polyuria, nocturia,  HEMATOLOGY: No anemia, easy bruising or bleeding SKIN: No rash or  lesion. MUSCULOSKELETAL: No joint pain or arthritis.   NEUROLOGIC: No tingling, numbness, weakness.  PSYCHIATRY: No anxiety or depression.   MEDICATIONS AT HOME:  Prior to Admission medications   Medication Sig Start Date End Date Taking? Authorizing Provider  acetaminophen (TYLENOL) 500 MG tablet Take 500-1,000 mg by mouth every 6 (six) hours as needed (for pain.).   Yes [provider]  budesonide-formoterol (SYMBICORT) 160-4.5 MCG/ACT inhaler Inhale 2 puffs daily into the lungs.    Yes [provider]  ferrous sulfate 325 (65 FE) MG tablet Take 1 tablet (325 mg total) by mouth 2 (two) times daily with a meal. Patient taking differently: Take 325 mg by mouth daily with breakfast.  08/08/17  Yes Wieting, Richard, MD  furosemide (LASIX) 20 MG tablet Take 20 mg daily by mouth.    Yes [provider]  lisinopril (PRINIVIL,ZESTRIL) 20 MG tablet Take 20 mg by mouth daily.   Yes [provider]  metoprolol succinate (TOPROL-XL) 50 MG 24 hr tablet Take 50 mg by mouth daily. Take with or immediately following a meal.   Yes [provider]  Polyethylene Glycol 3350 (PEG 3350) POWD Take 17 g by mouth daily as needed (for constipation.).  10/02/16  Yes [provider]  potassium chloride (MICRO-K) 10 MEQ CR capsule Take 10 mEq by mouth daily.   Yes [provider]  rOPINIRole (REQUIP) 0.5 MG tablet Take 0.5 mg by mouth at bedtime.   Yes [provider]  simvastatin (ZOCOR) 20 MG tablet Take 20 mg by mouth daily at 6 PM.   Yes [provider]  SUMAtriptan (IMITREX) 6 MG/0.5ML SOLN injection Inject 6 mg every 2 (two) hours as needed into the skin for migraine or headache. May repeat in 2 hours if headache persists or recurs.   Yes [provider]  tiotropium (SPIRIVA) 18 MCG inhalation capsule Place 18 mcg into inhaler and inhale daily.   Yes [provider]  vitamin B-12 1000 MCG tablet Take 1 tablet (1,000 mcg  total) by mouth daily. 08/09/17  Yes Wieting, Richard, MD  omeprazole (PRILOSEC) 20 MG capsule Take 1 capsule (20 mg total) by mouth 2 (two) times daily before a meal for 14 days. Patient not taking: Reported on 01/25/2018 12/24/17 01/07/18  Virgel Manifold, MD      PHYSICAL EXAMINATION:   VITAL SIGNS: Blood pressure 139/75, pulse 99, temperature 99.2 F (37.3 C), temperature source Oral, resp. rate 18, height 5\' 4"  (1.626 m), weight 83.6 kg, SpO2 98 %.  GENERAL:  67 y.o.-year-old patient lying in the bed with no acute distress.  EYES: Pupils equal, round, reactive to light and accommodation. No scleral icterus. Extraocular muscles intact.  HEENT: Head atraumatic, normocephalic. Oropharynx and nasopharynx clear.  NECK:  Supple, no jugular venous distention. No thyroid enlargement, no tenderness.  LUNGS: Normal breath sounds bilaterally, no wheezing, rales,rhonchi or crepitation. No use of accessory muscles of respiration.  CARDIOVASCULAR: S1, S2 normal. No murmurs, rubs, or gallops.  ABDOMEN: Soft, nontender, nondistended. Bowel sounds present. No organomegaly or  mass.  EXTREMITIES: No pedal edema, cyanosis, or clubbing.  Left knee dressing and cooling wrap present. NEUROLOGIC: Cranial nerves II through XII are intact. Muscle strength 5/5 in all extremities. Sensation intact. Gait not checked.  PSYCHIATRIC: The patient is alert and oriented x 3.  SKIN: No obvious rash, lesion, or ulcer.   LABORATORY PANEL:   CBC Recent Labs  Lab 02/15/18 1605 02/16/18 1523  WBC 8.7 7.4  HGB 9.0* 8.7*  HCT 26.9* 25.9*  PLT 176 173  MCV 83.6 83.7  MCH 27.8 28.3  MCHC 33.3 33.8  RDW 16.3* 16.0*   ------------------------------------------------------------------------------------------------------------------  Chemistries  Recent Labs  Lab 02/15/18 1605 02/16/18 1523  NA 137 140  K 3.6 4.0  CL 107 108  CO2 27 26  GLUCOSE 124* 106*  BUN 19 16  CREATININE 0.89 0.80  CALCIUM 8.4* 8.7*    ------------------------------------------------------------------------------------------------------------------ estimated creatinine clearance is 70.4 mL/min (by C-G formula based on SCr of 0.8 mg/dL). ------------------------------------------------------------------------------------------------------------------ No results for input(s): TSH, T4TOTAL, T3FREE, THYROIDAB in the last 72 hours.  Invalid input(s): FREET3   Coagulation profile No results for input(s): INR, PROTIME in the last 168 hours. ------------------------------------------------------------------------------------------------------------------- No results for input(s): DDIMER in the last 72 hours. -------------------------------------------------------------------------------------------------------------------  Cardiac Enzymes No results for input(s): CKMB, TROPONINI, MYOGLOBIN in the last 168 hours.  Invalid input(s): CK ------------------------------------------------------------------------------------------------------------------ Invalid input(s): POCBNP  ---------------------------------------------------------------------------------------------------------------  Urinalysis    Component Value Date/Time   COLORURINE YELLOW (A) 01/31/2018 1350   APPEARANCEUR CLEAR (A) 01/31/2018 1350   LABSPEC 1.020 01/31/2018 1350   PHURINE 5.0 01/31/2018 1350   GLUCOSEU NEGATIVE 01/31/2018 1350   HGBUR NEGATIVE 01/31/2018 1350   BILIRUBINUR NEGATIVE 01/31/2018 1350   KETONESUR NEGATIVE 01/31/2018 1350   PROTEINUR NEGATIVE 01/31/2018 1350   NITRITE NEGATIVE 01/31/2018 1350   LEUKOCYTESUR MODERATE (A) 01/31/2018 1350     RADIOLOGY: No results found.  EKG: Orders placed or performed during the hospital encounter of 01/31/18  . EKG test  . EKG test    IMPRESSION AND PLAN:  *Hypotension Patient has history of hypertension and is on multiple hypertensive medications Currently because of surgery and use  of pain medication, her blood pressure is running on lower side. I advised to hold lisinopril for now. If her blood pressure continued to run less than 130 then we can discharge without lisinopril at home. After she finished taking pain medications in the next few days, she should be reevaluated at home by checking her blood pressure and if systolic blood pressure more than 140 then she can start taking lisinopril.  Otherwise she can follow-up with her primary care doctor to readjust the dosing of her blood pressure pills. This was explained in detail to patient and her husband in the room.  *COPD No exacerbation symptoms, continue inhalers as home.  *Hyperlipidemia Continue simvastatin.  *Knee replacement surgery Manage per primary team for pain management and DVT prophylaxis.  All the records are reviewed and case discussed with ED provider. Management plans discussed with the patient, family and they are in agreement.  CODE STATUS: Full.    Code Status Orders  (From admission, onward)         Start     Ordered   02/14/18 1357  Full code  Continuous     02/14/18 1356        Code Status History    Date Active Date Inactive Code Status Order ID Comments User Context   08/06/2017 2027 08/08/2017 1935 Full Code [id number]  [place],  Richard, MD ED   04/11/2017 2204 04/13/2017 2124 Full Code [id number]  Bettey Costa, MD Inpatient   03/01/2016 1940 03/03/2016 1257 Full Code [id number]  Thornton Park, MD Inpatient       TOTAL TIME TAKING CARE OF THIS PATIENT: 45 minutes.    Vaughan Basta M.D on 02/16/2018   Between 7am to 6pm - Pager - [phone]  After 6pm go to [email or web address] - password [email or web address] [facility]  Office  [phone]  CC: Primary care physician; Letta Median, MD   Note: This dictation was prepared with Dragon dictation along with smaller phrase technology. Any transcriptional errors that result from this process are  unintentional.

## 2018-02-16 NOTE — Progress Notes (Signed)
  Subjective:  POD #2 s/p left TKA.  Patient reports left knee pain as moderate.  Husband is at the bedside.  BP improved.   Appreciate hospitalist consult.  Objective:   VITALS:   Vitals:   02/15/18 2345 02/16/18 0219 02/16/18 0846 02/16/18 1120  BP: (!) 89/65 117/76 127/76 123/63  Pulse: (!) 103 98 97   Resp: 17 17 18    Temp: 98.4 F (36.9 C)  98.8 F (37.1 C)   TempSrc: Oral  Oral   SpO2: 96% 96% 98%   Weight:      Height:        PHYSICAL EXAM: Left lower extremity:  I personally changed patient's dressing today.  She has scant amount of serosanguineous drainage from the incision.  The leg compartments are soft and compressible.  She is neurovascular intact. Neurovascular intact Sensation intact distally Intact pulses distally Dorsiflexion/Plantar flexion intact Incision: scant drainage No cellulitis present Compartment soft  LABS  Results for orders placed or performed during the hospital encounter of 02/14/18 (from the past 24 hour(s))  CBC     Status: Abnormal   Collection Time: 02/15/18  4:05 PM  Result Value Ref Range   WBC 8.7 3.6 - 11.0 K/uL   RBC 3.22 (L) 3.80 - 5.20 MIL/uL   Hemoglobin 9.0 (L) 12.0 - 16.0 g/dL   HCT 26.9 (L) 35.0 - 47.0 %   MCV 83.6 80.0 - 100.0 fL   MCH 27.8 26.0 - 34.0 pg   MCHC 33.3 32.0 - 36.0 g/dL   RDW 16.3 (H) 11.5 - 14.5 %   Platelets 176 150 - 440 K/uL  Basic metabolic panel     Status: Abnormal   Collection Time: 02/15/18  4:05 PM  Result Value Ref Range   Sodium 137 135 - 145 mmol/L   Potassium 3.6 3.5 - 5.1 mmol/L   Chloride 107 98 - 111 mmol/L   CO2 27 22 - 32 mmol/L   Glucose, Bld 124 (H) 70 - 99 mg/dL   BUN 19 8 - 23 mg/dL   Creatinine, Ser 0.89 0.44 - 1.00 mg/dL   Calcium 8.4 (L) 8.9 - 10.3 mg/dL   GFR calc non Af Amer >60 >60 mL/min   GFR calc Af Amer >60 >60 mL/min   Anion gap 3 (L) 5 - 15    No results found.  Assessment/Plan: 2 Days Post-Op   Active Problems:   History of total knee arthroplasty,  left   History of total knee arthroplasty  Patient continues to improve postop.  She walked out to the nurses station with physical therapy today.  Patient is up out of bed to a chair.  She will continue her knee immobilizer while lying down to help with extension.  Continue Lovenox for DVT prophylaxis.  I am going to recheck labs today.  Possible discharge home tomorrow.  Lisinopril is on hold per hospitalist recommendation.      Thornton Park , MD 02/16/2018, 2:54 PM

## 2018-02-17 ENCOUNTER — Observation Stay: Payer: Medicare HMO

## 2018-02-17 DIAGNOSIS — M1712 Unilateral primary osteoarthritis, left knee: Secondary | ICD-10-CM | POA: Diagnosis not present

## 2018-02-17 LAB — URINALYSIS, COMPLETE (UACMP) WITH MICROSCOPIC
Bacteria, UA: NONE SEEN
Bilirubin Urine: NEGATIVE
Glucose, UA: NEGATIVE mg/dL
Ketones, ur: NEGATIVE mg/dL
Nitrite: NEGATIVE
PH: 5 (ref 5.0–8.0)
Protein, ur: NEGATIVE mg/dL
Specific Gravity, Urine: 1.01 (ref 1.005–1.030)

## 2018-02-17 LAB — CBC
HCT: 23.6 % — ABNORMAL LOW (ref 35.0–47.0)
Hemoglobin: 7.8 g/dL — ABNORMAL LOW (ref 12.0–16.0)
MCH: 27.6 pg (ref 26.0–34.0)
MCHC: 33.1 g/dL (ref 32.0–36.0)
MCV: 83.3 fL (ref 80.0–100.0)
PLATELETS: 176 10*3/uL (ref 150–440)
RBC: 2.83 MIL/uL — ABNORMAL LOW (ref 3.80–5.20)
RDW: 15.9 % — ABNORMAL HIGH (ref 11.5–14.5)
WBC: 8 10*3/uL (ref 3.6–11.0)

## 2018-02-17 MED ORDER — DOCUSATE SODIUM 100 MG PO CAPS: 100.0000 mg | ORAL_CAPSULE | Freq: Two times a day (BID) | ORAL | 0 refills | Status: DC

## 2018-02-17 MED ORDER — BISACODYL 5 MG PO TBEC: 10.0000 mg | DELAYED_RELEASE_TABLET | Freq: Every day | ORAL | 0 refills | Status: DC | PRN

## 2018-02-17 MED ORDER — ENOXAPARIN SODIUM 40 MG/0.4ML ~~LOC~~ SOLN: 40.0000 mg | SUBCUTANEOUS | 0 refills | Status: DC

## 2018-02-17 MED ORDER — OXYCODONE HCL 5 MG PO TABS: 5.0000 mg | ORAL_TABLET | ORAL | 0 refills | Status: DC | PRN

## 2018-02-17 NOTE — Progress Notes (Signed)
Discharge instructions and prescription given to pt. IV removed. Honeycomb dressing changed. TEDs applied. Pt dressed and discharged home with husband.

## 2018-02-17 NOTE — Plan of Care (Signed)
  Problem: Education: Goal: Knowledge of General Education information will improve Description Including pain rating scale, medication(s)/side effects and non-pharmacologic comfort measures Outcome: Progressing   

## 2018-02-17 NOTE — Discharge Summary (Signed)
Physician Discharge Summary  Patient ID: Hailey Farrell MRN: [id number] DOB/AGE: Jul 01, 1950 67 y.o.  Admit date: 02/14/2018 Discharge date: 02/17/2018  Admission Diagnoses:  OSTEOARTHRITIS LEFT KNEE <principal problem not specified>  Discharge Diagnoses:  OSTEOARTHRITIS LEFT KNEE Active Problems:   History of total knee arthroplasty, left   History of total knee arthroplasty   Past Medical History:  Diagnosis Date  . Anemia   . Arthritis   . Breast cancer ([place]) 1990   Right  . COPD (chronic obstructive pulmonary disease) ([place])   . Dyspnea    DOE  . Elevated lipids   . Hypertension   . Lower extremity edema   . Migraines   . Migraines   . Restless leg syndrome   . Rotator cuff injury     Surgeries: Procedure(s): TOTAL KNEE ARTHROPLASTY on 02/14/2018   Consultants (if any): Treatment Team:  Vaughan Basta, MD  Discharged Condition: Improved  Hospital Course: Hailey Farrell is an 67 y.o. female who was admitted 02/14/2018 with a diagnosis of  OSTEOARTHRITIS LEFT KNEE and went to the operating room on 02/14/2018 and underwent an uncomplicated left total knee arthroplasty.    She was given perioperative antibiotics:  Anti-infectives (From admission, onward)   Start     Dose/Rate Route Frequency Ordered Stop   02/14/18 1500  ceFAZolin (ANCEF) IVPB 1 g/50 mL premix     1 g 100 mL/hr over 30 Minutes Intravenous Every 6 hours 02/14/18 1356 02/14/18 2055   02/14/18 0702  ceFAZolin (ANCEF) 2-4 GM/100ML-% IVPB    Note to Pharmacy:  Cleatis Polka   : cabinet override      02/14/18 0702 02/14/18 0914   02/14/18 0701  clindamycin (CLEOCIN) 600 MG/50ML IVPB    Note to Pharmacy:  Cleatis Polka   : cabinet override      02/14/18 0701 02/14/18 0918   02/14/18 0600  ceFAZolin (ANCEF) IVPB 2g/100 mL premix     2 g 200 mL/hr over 30 Minutes Intravenous On call to O.R. 02/13/18 2213 02/14/18 0914   02/13/18 2215  clindamycin (CLEOCIN) IVPB 600 mg     600 mg 100 mL/hr over  30 Minutes Intravenous  Once 02/13/18 2213 02/14/18 0948    .  She was given sequential compression devices, early ambulation, and Lovenox for DVT prophylaxis.  Patient was admitted to the orthopedic floor postoperatively.  She received 24 hours of postop antibiotics.  Patient received physical therapy beginning the day of surgery.  Should she continue with therapy throughout her hospitalization.  Patient's hemoglobin and hematocrit remained in an acceptable range during her hospitalization.  Patient did have episode of hypotension postoperatively and was seen by the hospitalist service.  Her lisinopril was held during her hospitalization.  Given the patient's clinical improvement on postop day #3 she was discharged home.  She benefited maximally from the hospital stay and there were no complications.    Recent vital signs:  Vitals:   02/17/18 0723 02/17/18 0927  BP: 140/80 116/80  Pulse: (!) 106 (!) 108  Resp: 16   Temp: 99.8 F (37.7 C)   SpO2: 98%     Recent laboratory studies:  Lab Results  Component Value Date   HGB 7.8 (L) 02/17/2018   HGB 8.7 (L) 02/16/2018   HGB 9.0 (L) 02/15/2018   Lab Results  Component Value Date   WBC 8.0 02/17/2018   PLT 176 02/17/2018   Lab Results  Component Value Date   INR 0.95 01/31/2018   Lab  Results  Component Value Date   NA 140 02/16/2018   K 4.0 02/16/2018   CL 108 02/16/2018   CO2 26 02/16/2018   BUN 16 02/16/2018   CREATININE 0.80 02/16/2018   GLUCOSE 106 (H) 02/16/2018    Discharge Medications:   Allergies as of 02/17/2018      Reactions   Latex Rash   Welts over body      Medication List    STOP taking these medications   lisinopril 20 MG tablet Commonly known as:  PRINIVIL,ZESTRIL   omeprazole 20 MG capsule Commonly known as:  PRILOSEC     TAKE these medications   acetaminophen 500 MG tablet Commonly known as:  TYLENOL Take 500-1,000 mg by mouth every 6 (six) hours as needed (for pain.).   bisacodyl 5 MG  EC tablet Commonly known as:  DULCOLAX Take 2 tablets (10 mg total) by mouth daily as needed for moderate constipation.   budesonide-formoterol 160-4.5 MCG/ACT inhaler Commonly known as:  SYMBICORT Inhale 2 puffs daily into the lungs.   cyanocobalamin 1000 MCG tablet Take 1 tablet (1,000 mcg total) by mouth daily.   docusate sodium 100 MG capsule Commonly known as:  COLACE Take 1 capsule (100 mg total) by mouth 2 (two) times daily.   enoxaparin 40 MG/0.4ML injection Commonly known as:  LOVENOX Inject 0.4 mLs (40 mg total) into the skin daily. Start taking on:  02/18/2018   ferrous sulfate 325 (65 FE) MG tablet Take 1 tablet (325 mg total) by mouth 2 (two) times daily with a meal. What changed:  when to take this   furosemide 20 MG tablet Commonly known as:  LASIX Take 20 mg daily by mouth.   metoprolol succinate 50 MG 24 hr tablet Commonly known as:  TOPROL-XL Take 50 mg by mouth daily. Take with or immediately following a meal.   oxyCODONE 5 MG immediate release tablet Commonly known as:  Oxy IR/ROXICODONE Take 1 tablet (5 mg total) by mouth every 4 (four) hours as needed for moderate pain (pain score 4-6).   PEG 3350 Powd Take 17 g by mouth daily as needed (for constipation.).   potassium chloride 10 MEQ CR capsule Commonly known as:  MICRO-K Take 10 mEq by mouth daily.   rOPINIRole 0.5 MG tablet Commonly known as:  REQUIP Take 0.5 mg by mouth at bedtime.   simvastatin 20 MG tablet Commonly known as:  ZOCOR Take 20 mg by mouth daily at 6 PM.   SUMAtriptan 6 MG/0.5ML Soln injection Commonly known as:  IMITREX Inject 6 mg every 2 (two) hours as needed into the skin for migraine or headache. May repeat in 2 hours if headache persists or recurs.   tiotropium 18 MCG inhalation capsule Commonly known as:  SPIRIVA Place 18 mcg into inhaler and inhale daily.            Durable Medical Equipment  (From admission, onward)         Start     Ordered    02/15/18 1530  For home use only DME Bedside commode  Once    Question:  Patient needs a bedside commode to treat with the following condition  Answer:  Weakness   02/15/18 1530   02/15/18 1528  For home use only DME Walker rolling  Once    Question:  Patient needs a walker to treat with the following condition  Answer:  Weakness   02/15/18 1530  Diagnostic Studies: Dg Chest 2 View  Result Date: 02/17/2018 CLINICAL DATA:  Fever. History of COPD, hypertension and breast cancer. Former smoker. EXAM: CHEST - 2 VIEW COMPARISON:  01/31/2018; 08/06/2017 FINDINGS: Grossly unchanged borderline enlarged cardiac silhouette given reduced lung volumes and AP projection. Grossly unchanged mild tortuosity and potential ectasia of the thoracic aorta. There is persistent thickening the right paratracheal stripe, presumably secondary prominent vasculature. There is persistent mild elevation/eventration of the right hemidiaphragm. No focal airspace opacities. No pleural effusion or pneumothorax. No evidence of edema. Post lower cervical paraspinal fusion, incompletely evaluated. IMPRESSION: Cardiomegaly and hypoventilation without acute cardiopulmonary disease. Electronically Signed   By: Sandi Mariscal M.D.   On: 02/17/2018 09:02   Chest 2 View  Result Date: 02/01/2018 CLINICAL DATA:  Preoperative respiratory evaluation prior to unspecified LEFT knee surgery scheduled for 02/14/2018. Former smoker. Personal history of RIGHT breast cancer with prior lumpectomy. EXAM: CHEST - 2 VIEW COMPARISON:  Portable chest x-ray 08/06/2017. FINDINGS: Cardiac silhouette normal in size, unchanged. Thoracic aorta tortuous and minimally atherosclerotic. Hilar and mediastinal contours otherwise unremarkable. Eventration of the RIGHT ANTERIOR hemidiaphragm as noted previously. Lungs clear. Bronchovascular markings normal. Pulmonary vascularity normal. No visible pleural effusions. No pneumothorax. Degenerative changes involving the  thoracic spine. IMPRESSION: No acute cardiopulmonary disease. Electronically Signed   By: Evangeline Dakin M.D.   On: 02/01/2018 08:20   Dg Knee Left Port  Result Date: 02/14/2018 CLINICAL DATA:  Total knee arthroplasty left EXAM: PORTABLE LEFT KNEE - 1-2 VIEW COMPARISON:  None. FINDINGS: Total knee replacement in satisfactory position alignment. No fracture or acute complication. Gas in the joint and surrounding soft tissues. IMPRESSION: Satisfactory total knee replacement. Electronically Signed   By: Franchot Gallo M.D.   On: 02/14/2018 13:26    Disposition: Discharge disposition: 01-Home or Self Care       Discharge Instructions    Call MD / Call 911   Complete by:  As directed    If you experience chest pain or shortness of breath, CALL 911 and be transported to the hospital emergency room.  If you develope a fever above 101 F, pus (white drainage) or increased drainage or redness at the wound, or calf pain, call your surgeon's office.   Constipation Prevention   Complete by:  As directed    Drink plenty of fluids.  Prune juice may be helpful.  You may use a stool softener, such as Colace (over the counter) 100 mg twice a day.  Use MiraLax (over the counter) for constipation as needed.   Diet general   Complete by:  As directed    Discharge instructions   Complete by:  As directed    Keep dressing intact and dry until follow-up.  Use thigh-high TED stockings during the day to decrease lower extremity edema.  Take Lovenox 40 mg injection daily for DVT prophylaxis.  Continue to elevate the lower extremity and use Polar Care to help reduce swelling.  Patient may be weightbearing as tolerated on the left lower extremity.  She should use her walker for assistance with ambulation.  Patient will come to the office for physical therapy next week.  Patient will hold on taking her lisinopril for the next few days and contact her primary care physician for follow-up regarding restarting her  lisinopril.   Driving restrictions   Complete by:  As directed    No driving until follow up with Dr. Mack Guise   Increase activity slowly as tolerated   Complete by:  As directed    Lifting restrictions   Complete by:  As directed    No lifting for 12-16 weeks         Signed: Thornton Park ,MD 02/17/2018, 4:14 PM

## 2018-02-17 NOTE — Progress Notes (Signed)
PT Cancellation Note  Patient Details Name: JAZLEN OGARRO MRN: [id number] DOB: [birth year]   Cancelled Treatment:    Reason Eval/Treat Not Completed: Patient at procedure or test/unavailable.  Patient out of room fopr x-ray.  Spoke with pt's husband briefly.  Will re-attempt later when pt is available.   Roxanne Gates, PT, DPT 02/17/2018, 9:42 AM

## 2018-02-17 NOTE — Progress Notes (Signed)
[facility] [place] NAME: Hailey Farrell    MR#:  [id number]  DATE OF BIRTH:  April 09, 1951  SUBJECTIVE:  CHIEF COMPLAINT:  No chief complaint on file. Had low-grade fever last night but no complaints.  REVIEW OF SYSTEMS:  CONSTITUTIONAL: Low-grade fever, no fatigue or weakness.  EYES: No blurred or double vision.  EARS, NOSE, AND THROAT: No tinnitus or ear pain.  RESPIRATORY: No cough, shortness of breath, wheezing or hemoptysis.  CARDIOVASCULAR: No chest pain, orthopnea, edema.  GASTROINTESTINAL: No nausea, vomiting, diarrhea or abdominal pain.  GENITOURINARY: No dysuria, hematuria.  ENDOCRINE: No polyuria, nocturia,  HEMATOLOGY: No anemia, easy bruising or bleeding SKIN: No rash or lesion. MUSCULOSKELETAL: No joint pain or arthritis.   NEUROLOGIC: No tingling, numbness, weakness.  PSYCHIATRY: No anxiety or depression.   ROS  DRUG ALLERGIES:   Allergies  Allergen Reactions  . Latex Rash    Welts over body    VITALS:  Blood pressure 116/80, pulse (!) 108, temperature 99.8 F (37.7 C), temperature source Oral, resp. rate 16, height 5\' 4"  (1.626 m), weight 83.6 kg, SpO2 98 %.  PHYSICAL EXAMINATION:  GENERAL:  67 y.o.-year-old patient lying in the bed with no acute distress.  EYES: Pupils equal, round, reactive to light and accommodation. No scleral icterus. Extraocular muscles intact.  HEENT: Head atraumatic, normocephalic. Oropharynx and nasopharynx clear.  NECK:  Supple, no jugular venous distention. No thyroid enlargement, no tenderness.  LUNGS: Normal breath sounds bilaterally, no wheezing, rales,rhonchi or crepitation. No use of accessory muscles of respiration.  CARDIOVASCULAR: S1, S2 normal. No murmurs, rubs, or gallops.  ABDOMEN: Soft, nontender, nondistended. Bowel sounds present. No organomegaly or mass.  EXTREMITIES: No pedal edema, cyanosis, or clubbing.  NEUROLOGIC: Cranial nerves II through XII are intact. Muscle  strength 5/5 in all extremities. Sensation intact. Gait not checked.  PSYCHIATRIC: The patient is alert and oriented x 3.  SKIN: No obvious rash, lesion, or ulcer.   Physical Exam LABORATORY PANEL:   CBC Recent Labs  Lab 02/17/18 0906  WBC 8.0  HGB 7.8*  HCT 23.6*  PLT 176   ------------------------------------------------------------------------------------------------------------------  Chemistries  Recent Labs  Lab 02/16/18 1523  NA 140  K 4.0  CL 108  CO2 26  GLUCOSE 106*  BUN 16  CREATININE 0.80  CALCIUM 8.7*   ------------------------------------------------------------------------------------------------------------------  Cardiac Enzymes No results for input(s): TROPONINI in the last 168 hours. ------------------------------------------------------------------------------------------------------------------  RADIOLOGY:  Dg Chest 2 View  Result Date: 02/17/2018 CLINICAL DATA:  Fever. History of COPD, hypertension and breast cancer. Former smoker. EXAM: CHEST - 2 VIEW COMPARISON:  01/31/2018; 08/06/2017 FINDINGS: Grossly unchanged borderline enlarged cardiac silhouette given reduced lung volumes and AP projection. Grossly unchanged mild tortuosity and potential ectasia of the thoracic aorta. There is persistent thickening the right paratracheal stripe, presumably secondary prominent vasculature. There is persistent mild elevation/eventration of the right hemidiaphragm. No focal airspace opacities. No pleural effusion or pneumothorax. No evidence of edema. Post lower cervical paraspinal fusion, incompletely evaluated. IMPRESSION: Cardiomegaly and hypoventilation without acute cardiopulmonary disease. Electronically Signed   By: Sandi Mariscal M.D.   On: 02/17/2018 09:02    ASSESSMENT AND PLAN:   Active Problems:   History of total knee arthroplasty, left   History of total knee arthroplasty   *Hypotension Patient has history of hypertension and is on multiple  hypertensive medications Currently because of surgery and use of pain medication, her blood pressure is running on lower side. I advised to  hold lisinopril for now. If her blood pressure continued to run less than 130 then we can discharge without lisinopril at home. After she finished taking pain medications in the next few days, she should be reevaluated at home by checking her blood pressure and if systolic blood pressure more than 140 then she can start taking lisinopril.  Otherwise she can follow-up with her primary care doctor to readjust the dosing of her blood pressure pills. This was explained in detail to patient and her husband in the room.  Blood pressure is stable now.  *Low-grade fever Checked x-ray chest which is negative for any infiltrate.  Urinalysis is negative. Patient denies any complaints. This could be postoperative due to medications. If her surgical site is satisfactory, she can be discharged home without further interventions. I had advised patient to come back to emergency room or hospital if she develops having high-grade fever or continue to have recurrent low-grade fever.  *COPD No exacerbation symptoms, continue inhalers as home.  *Hyperlipidemia Continue simvastatin.  *Knee replacement surgery Manage per primary team for pain management and DVT prophylaxis.    All the records are reviewed and case discussed with Care Management/Social Workerr. Management plans discussed with the patient, family and they are in agreement.  CODE STATUS: Full.  TOTAL TIME TAKING CARE OF THIS PATIENT: 35 minutes.    POSSIBLE D/C IN 1-2 DAYS, DEPENDING ON CLINICAL CONDITION.   Vaughan Basta M.D on 02/17/2018   Between 7am to 6pm - Pager - [phone]  After 6pm go to [email or web address] - password [email or web address] [facility]  Office  [phone]  CC: Primary care physician; Letta Median, MD  Note: This dictation was prepared with  Dragon dictation along with smaller phrase technology. Any transcriptional errors that result from this process are unintentional.

## 2018-02-17 NOTE — Progress Notes (Signed)
Subjective:  POD #3 s/p left TKA.  Patient reports left knee pain as mild.  Patient was able to get up today and walked to the nurses station with physical therapy.  Objective:   VITALS:   Vitals:   02/16/18 2315 02/17/18 0125 02/17/18 0723 02/17/18 0927  BP: 137/78  140/80 116/80  Pulse: (!) 115  (!) 106 (!) 108  Resp: 19  16   Temp: (!) 100.8 F (38.2 C) 99.1 F (37.3 C) 99.8 F (37.7 C)   TempSrc: Oral Oral Oral   SpO2: 99%  98%   Weight:      Height:        PHYSICAL EXAM: Left lower extremity: Neurovascular intact Sensation intact distally Intact pulses distally Dorsiflexion/Plantar flexion intact Incision: dressing C/D/I No cellulitis present Compartment soft  LABS  Results for orders placed or performed during the hospital encounter of 02/14/18 (from the past 24 hour(s))  CBC     Status: Abnormal   Collection Time: 02/17/18  9:06 AM  Result Value Ref Range   WBC 8.0 3.6 - 11.0 K/uL   RBC 2.83 (L) 3.80 - 5.20 MIL/uL   Hemoglobin 7.8 (L) 12.0 - 16.0 g/dL   HCT 23.6 (L) 35.0 - 47.0 %   MCV 83.3 80.0 - 100.0 fL   MCH 27.6 26.0 - 34.0 pg   MCHC 33.1 32.0 - 36.0 g/dL   RDW 15.9 (H) 11.5 - 14.5 %   Platelets 176 150 - 440 K/uL  Urinalysis, Complete w Microscopic     Status: Abnormal   Collection Time: 02/17/18  9:47 AM  Result Value Ref Range   Color, Urine YELLOW (A) YELLOW   APPearance CLEAR (A) CLEAR   Specific Gravity, Urine 1.010 1.005 - 1.030   pH 5.0 5.0 - 8.0   Glucose, UA NEGATIVE NEGATIVE mg/dL   Hgb urine dipstick SMALL (A) NEGATIVE   Bilirubin Urine NEGATIVE NEGATIVE   Ketones, ur NEGATIVE NEGATIVE mg/dL   Protein, ur NEGATIVE NEGATIVE mg/dL   Nitrite NEGATIVE NEGATIVE   Leukocytes, UA TRACE (A) NEGATIVE   RBC / HPF 0-5 0 - 5 RBC/hpf   WBC, UA 0-5 0 - 5 WBC/hpf   Bacteria, UA NONE SEEN NONE SEEN   Squamous Epithelial / LPF 0-5 0 - 5   Mucus PRESENT    Hyaline Casts, UA PRESENT     Dg Chest 2 View  Result Date: 02/17/2018 CLINICAL  DATA:  Fever. History of COPD, hypertension and breast cancer. Former smoker. EXAM: CHEST - 2 VIEW COMPARISON:  01/31/2018; 08/06/2017 FINDINGS: Grossly unchanged borderline enlarged cardiac silhouette given reduced lung volumes and AP projection. Grossly unchanged mild tortuosity and potential ectasia of the thoracic aorta. There is persistent thickening the right paratracheal stripe, presumably secondary prominent vasculature. There is persistent mild elevation/eventration of the right hemidiaphragm. No focal airspace opacities. No pleural effusion or pneumothorax. No evidence of edema. Post lower cervical paraspinal fusion, incompletely evaluated. IMPRESSION: Cardiomegaly and hypoventilation without acute cardiopulmonary disease. Electronically Signed   By: Sandi Mariscal M.D.   On: 02/17/2018 09:02    Assessment/Plan: 3 Days Post-Op   Active Problems:   History of total knee arthroplasty, left   History of total knee arthroplasty  Patient is made good progress postoperatively.  She will hold her lisinopril for the next several days after discharge.  She will contact her primary care physician to discuss when to restart this medication.  She will follow-up in my office in 7 to 10 days for  wound check and staple removal.  She will follow-up in our office for outpatient physical therapy.  Patient had low-grade fevers overnight and was encouraged to continue incentive spirometry at home.     Thornton Park , MD 02/17/2018, 4:03 PM

## 2018-02-17 NOTE — Progress Notes (Signed)
Physical Therapy Treatment Patient Details Name: Hailey Farrell MRN: [id number] DOB: 1951-01-16 Today's Date: 02/17/2018    History of Present Illness Pt. is a 67 y.o. female who was admitted to [facility] for a Left TKA. Pt. PMHx includes: Anemia, Arthritis, Breast CA, COPD, Dyspnea, HTN, Migraines, RTC surgery, Restless, Leg Syndrome.    PT Comments    Pt demonstrated progress with stair negotiation and transfer ability today.  She was more confident in movement during transfers and gait and demonstrated understanding of there ex.  Pt did still require assistance in supine hip abduction and heel slides as she is having difficulty initiating hip flexion.    Pt reported a higher pain level (6/10) and feeling weakened from a fever which has developed overnight but no signs of hypotension when standing.  She was able to ambulate 40 ft in room with RW and demonstrate ability to step up on a 7+" step with min A from her husband.  All education for stair negotiation, management of pain and expectations for rehabilitation after discharge were well received by patient and her husband.  Pt RW had been set to appropriate height and knee immobilizer remained donned during all standing activity.  PT removed knee immobilizer following pt's return to bed and noted increased swelling in L ankle about which RN was notified.  Pt's knee flexion continued to be limited by pain and bandage applied over surgical site.  Pt will continue to benefit from skilled PT with focus on strength, tolerance to activity, knee ROM and pain management.  Home health PT following discharge is still the appropriate recommendation.   Follow Up Recommendations  Home health PT     Equipment Recommendations  Rolling walker with 5" wheels;3in1 (PT)    Recommendations for Other Services       Precautions / Restrictions Precautions Precautions: Fall;Knee Required Braces or Orthoses: Knee Immobilizer - Left Knee Immobilizer - Left: On when  out of bed or walking Restrictions Weight Bearing Restrictions: Yes LLE Weight Bearing: Weight bearing as tolerated    Mobility  Bed Mobility Overal bed mobility: Needs Assistance Bed Mobility: Sit to Supine;Supine to Sit     Supine to sit: Min assist Sit to supine: Min assist   General bed mobility comments: Still needs min A to bring LE over EOB which husband is able to assist with.  Pt can also perform unilateral bridge to scoot up in bed.  Transfers Overall transfer level: Needs assistance Equipment used: Rolling walker (2 wheeled) Transfers: Sit to/from Stand Sit to Stand: Min assist         General transfer comment: Pt more confident with STS today, asked husband to not help her and was able to stand without VC's for foot placement.  Pt did not become hypotensive when standing; did report feeling slightly weak due to running a mild fever.  Ambulation/Gait Ambulation/Gait assistance: Supervision Gait Distance (Feet): 20 Feet Assistive device: Rolling walker (2 wheeled)     Gait velocity interpretation: <1.8 ft/sec, indicate of risk for recurrent falls General Gait Details: Gait is more symmetric, knee immobilizer donned,  good use of RW.   Stairs Stairs: Yes Stairs assistance: Min assist Stair Management: No rails;Step to pattern Number of Stairs: 1 General stair comments: Pt is able to step the height of a 7 inch step with husband on L side and RW in front of her or to her R side.  PT educated pt concerning step to pattern, sequencing and options for RW placement.  Both husband and pt demonstrated understanding.   Wheelchair Mobility    Modified Rankin (Stroke Patients Only)       Balance Overall balance assessment: Needs assistance Sitting-balance support: Bilateral upper extremity supported;Feet supported Sitting balance-Leahy Scale: Normal     Standing balance support: Bilateral upper extremity supported Standing balance-Leahy Scale: Fair                               Cognition Arousal/Alertness: Awake/alert Behavior During Therapy: WFL for tasks assessed/performed Overall Cognitive Status: Within Functional Limits for tasks assessed                                 General Comments: Follows commands consistently.      Exercises Total Joint Exercises Quad Sets: Strengthening;15 reps;Supine;Both(Performed simultaneously to assist with coordination.) Heel Slides: AAROM;Left;10 reps;Supine Hip ABduction/ADduction: AAROM;10 reps;Supine;Left Goniometric ROM: 1-75 limited by bulky dressing and pain Other Exercises Other Exercises: Education regarding use of ice for pain management and swelling, use of knee immobilizer and car transfers.  x10 min Other Exercises: Pt's new RW set a correct height. x2 min    General Comments        Pertinent Vitals/Pain Pain Assessment: 0-10 Pain Score: 6  Pain Descriptors / Indicators: Aching Pain Intervention(s): Monitored during session;Limited activity within patient's tolerance    Home Living                      Prior Function            PT Goals (current goals can now be found in the care plan section) Acute Rehab PT Goals Patient Stated Goal: To go home and resume daily activity. PT Goal Formulation: With patient Time For Goal Achievement: 03/02/18 Potential to Achieve Goals: Good Progress towards PT goals: Progressing toward goals    Frequency    BID      PT Plan Current plan remains appropriate    Co-evaluation              AM-PAC PT "6 Clicks" Daily Activity  Outcome Measure  Difficulty turning over in bed (including adjusting bedclothes, sheets and blankets)?: None Difficulty moving from lying on back to sitting on the side of the bed? : A Little Difficulty sitting down on and standing up from a chair with arms (e.g., wheelchair, bedside commode, etc,.)?: A Little Help needed moving to and from a bed to chair (including a  wheelchair)?: A Little Help needed walking in hospital room?: A Little Help needed climbing 3-5 steps with a railing? : A Little 6 Click Score: 19    End of Session Equipment Utilized During Treatment: Gait belt;Left knee immobilizer Activity Tolerance: Patient tolerated treatment well Patient left: in bed;with bed alarm set;with family/visitor present;with call bell/phone within reach Nurse Communication: Mobility status(Noted swelling in pt's L ankle, knee immobilizer left off at end of session.)       Time: 1040-1109 PT Time Calculation (min) (ACUTE ONLY): 29 min  Charges:  $Therapeutic Exercise: 8-22 mins $Therapeutic Activity: 8-22 mins                    Roxanne Gates, PT, DPT  Roxanne Gates 02/17/2018, 11:32 AM

## 2018-02-24 NOTE — Progress Notes (Deleted)
Patient's hemoglobin and iron stores are now within normal limits. [name]  [phone]) [phone] [phone]  ID: Hailey Farrell OB: 1951/02/10  MR#: [id number]  DXI#:[id number]  Patient Care Team: Letta Median, MD as PCP - General (Family Medicine)  CHIEF COMPLAINT:  Iron deficiency anemia  INTERVAL HISTORY: Patient returns to clinic today for repeat laboratory work and further evaluation.  She feels significantly improved after receiving 2 infusions of IV Feraheme in March 2019.  She currently feels well and is asymptomatic.  She does not complain of weakness or fatigue today.  She has no neurologic complaints.  She denies any recent fevers or illnesses.  She has a good appetite and denies weight loss.  She has no chest pain or shortness of breath.  She denies any nausea, vomiting, constipation, or diarrhea.  She denies any melena or hematochezia.  She has no urinary complaints.  Patient offers no specific complaints today.  REVIEW OF SYSTEMS:   Review of Systems  Constitutional: Negative.  Negative for fever, malaise/fatigue and weight loss.  Respiratory: Negative.  Negative for cough, hemoptysis and shortness of breath.   Cardiovascular: Negative.  Negative for chest pain.  Gastrointestinal: Negative.  Negative for abdominal pain, blood in stool and melena.  Genitourinary: Negative.  Negative for hematuria.  Musculoskeletal: Negative.  Negative for back pain.  Skin: Negative.  Negative for rash.  Neurological: Negative.  Negative for sensory change, focal weakness and weakness.  Psychiatric/Behavioral: Negative.  The patient is not nervous/anxious.     As per HPI. Otherwise, a complete review of systems is negative.  PAST MEDICAL HISTORY: Past Medical History:  Diagnosis Date  . Anemia   . Arthritis   . Breast cancer ([place]) 1990   Right  . COPD (chronic obstructive pulmonary disease) ([place])   . Dyspnea    DOE  . Elevated lipids   .  Hypertension   . Lower extremity edema   . Migraines   . Migraines   . Restless leg syndrome   . Rotator cuff injury     PAST SURGICAL HISTORY: Past Surgical History:  Procedure Laterality Date  . ABDOMINAL HYSTERECTOMY    . BACK SURGERY  08/2017   CERVICAL FUSION  . BREAST BIOPSY Right 1990   LUMPECTOMY.  took lymph nodes  . COLONOSCOPY WITH PROPOFOL N/A 07/20/2017   Procedure: COLONOSCOPY WITH PROPOFOL;  Surgeon: Virgel Manifold, MD;  Location: [facility] ENDOSCOPY;  Service: Endoscopy;  Laterality: N/A;  . ENTEROSCOPY N/A 08/08/2017   Procedure: ENTEROSCOPY;  Surgeon: Lin Landsman, MD;  Location[facility] ENDOSCOPY;  Service: Gastroenterology;  Laterality: N/A;  . ESOPHAGOGASTRODUODENOSCOPY Left 04/13/2017   Procedure: ESOPHAGOGASTRODUODENOSCOPY (EGD);  Surgeon: Virgel Manifold, MD;  Location: [facility] ENDOSCOPY;  Service: Endoscopy;  Laterality: Left;  . ESOPHAGOGASTRODUODENOSCOPY (EGD) WITH PROPOFOL N/A 07/20/2017   Procedure: ESOPHAGOGASTRODUODENOSCOPY (EGD) WITH PROPOFOL;  Surgeon: Virgel Manifold, MD;  Location: [facility] ENDOSCOPY;  Service: Endoscopy;  Laterality: N/A;  . ESOPHAGOGASTRODUODENOSCOPY (EGD) WITH PROPOFOL N/A 12/12/2017   Procedure: ESOPHAGOGASTRODUODENOSCOPY (EGD) WITH PROPOFOL;  Surgeon: Virgel Manifold, MD;  Location: [facility] ENDOSCOPY;  Service: Endoscopy;  Laterality: N/A;  . MEDIAL PARTIAL KNEE REPLACEMENT Left 1990   torn ligament. no knee replacement at that time  . PARTIAL HYSTERECTOMY    . SHOULDER ARTHROSCOPY WITH OPEN ROTATOR CUFF REPAIR Left 03/01/2016   Procedure: SHOULDER ARTHROSCOPY WITH OPEN ROTATOR CUFF REPAIR;  Surgeon: Thornton Park, MD;  Location: [facility] ORS;  Service: Orthopedics;  Laterality: Left;  .  TOTAL KNEE ARTHROPLASTY Left 02/14/2018   Procedure: TOTAL KNEE ARTHROPLASTY;  Surgeon: Thornton Park, MD;  Location: [facility] ORS;  Service: Orthopedics;  Laterality: Left;    FAMILY HISTORY: Family History  Problem Relation Age of Onset    . CAD Mother   . CAD Sister     ADVANCED DIRECTIVES (Y/N):  N  HEALTH MAINTENANCE: Social History   Tobacco Use  . Smoking status: Former Smoker    Packs/day: 0.10    Years: 50.00    Pack years: 5.00    Types: Cigarettes    Last attempt to quit: 03/13/2016    Years since quitting: 1.9  . Smokeless tobacco: Never Used  Substance Use Topics  . Alcohol use: No  . Drug use: No     Colonoscopy:  PAP:  Bone density:  Lipid panel:  Allergies  Allergen Reactions  . Latex Rash    Welts over body    Current Outpatient Medications  Medication Sig Dispense Refill  . acetaminophen (TYLENOL) 500 MG tablet Take 500-1,000 mg by mouth every 6 (six) hours as needed (for pain.).    Marland Kitchen bisacodyl (DULCOLAX) 5 MG EC tablet Take 2 tablets (10 mg total) by mouth daily as needed for moderate constipation. 30 tablet 0  . budesonide-formoterol (SYMBICORT) 160-4.5 MCG/ACT inhaler Inhale 2 puffs daily into the lungs.     . docusate sodium (COLACE) 100 MG capsule Take 1 capsule (100 mg total) by mouth 2 (two) times daily. 10 capsule 0  . enoxaparin (LOVENOX) 40 MG/0.4ML injection Inject 0.4 mLs (40 mg total) into the skin daily. 30 Syringe 0  . ferrous sulfate 325 (65 FE) MG tablet Take 1 tablet (325 mg total) by mouth 2 (two) times daily with a meal. (Patient taking differently: Take 325 mg by mouth daily with breakfast. ) 60 tablet 0  . furosemide (LASIX) 20 MG tablet Take 20 mg daily by mouth.     . metoprolol succinate (TOPROL-XL) 50 MG 24 hr tablet Take 50 mg by mouth daily. Take with or immediately following a meal.    . oxyCODONE (OXY IR/ROXICODONE) 5 MG immediate release tablet Take 1 tablet (5 mg total) by mouth every 4 (four) hours as needed for moderate pain (pain score 4-6). 40 tablet 0  . Polyethylene Glycol 3350 (PEG 3350) POWD Take 17 g by mouth daily as needed (for constipation.).     Marland Kitchen potassium chloride (MICRO-K) 10 MEQ CR capsule Take 10 mEq by mouth daily.    Marland Kitchen rOPINIRole  (REQUIP) 0.5 MG tablet Take 0.5 mg by mouth at bedtime.    . simvastatin (ZOCOR) 20 MG tablet Take 20 mg by mouth daily at 6 PM.    . SUMAtriptan (IMITREX) 6 MG/0.5ML SOLN injection Inject 6 mg every 2 (two) hours as needed into the skin for migraine or headache. May repeat in 2 hours if headache persists or recurs.    Marland Kitchen tiotropium (SPIRIVA) 18 MCG inhalation capsule Place 18 mcg into inhaler and inhale daily.    . vitamin B-12 1000 MCG tablet Take 1 tablet (1,000 mcg total) by mouth daily. 30 tablet 0   No current facility-administered medications for this visit.   From  OBJECTIVE: There were no vitals filed for this visit.   There is no height or weight on file to calculate BMI.    ECOG FS:0 - Asymptomatic  General: Well-developed, well-nourished, no acute distress. Eyes: Pink conjunctiva, anicteric sclera. Lungs: Clear to auscultation bilaterally. Heart: Regular rate and rhythm. No  rubs, murmurs, or gallops. Abdomen: Soft, nontender, nondistended. No organomegaly noted, normoactive bowel sounds. Musculoskeletal: No edema, cyanosis, or clubbing. Neuro: Alert, answering all questions appropriately. Cranial nerves grossly intact. Skin: No rashes or petechiae noted. Psych: Normal affect.  LAB RESULTS:  Lab Results  Component Value Date   NA 140 02/16/2018   K 4.0 02/16/2018   CL 108 02/16/2018   CO2 26 02/16/2018   GLUCOSE 106 (H) 02/16/2018   BUN 16 02/16/2018   CREATININE 0.80 02/16/2018   CALCIUM 8.7 (L) 02/16/2018   PROT 6.0 (L) 08/06/2017   ALBUMIN 3.5 08/06/2017   AST 17 08/06/2017   ALT 8 (L) 08/06/2017   ALKPHOS 32 (L) 08/06/2017   BILITOT 0.5 08/06/2017   GFRNONAA >60 02/16/2018   GFRAA >60 02/16/2018    Lab Results  Component Value Date   WBC 8.0 02/17/2018   NEUTROABS 2.5 01/31/2018   HGB 7.8 (L) 02/17/2018   HCT 23.6 (L) 02/17/2018   MCV 83.3 02/17/2018   PLT 176 02/17/2018   Lab Results  Component Value Date   IRON 89 11/20/2017   TIBC 264  11/20/2017   IRONPCTSAT 34 (H) 11/20/2017   Lab Results  Component Value Date   FERRITIN 29 11/20/2017     STUDIES: Dg Chest 2 View  Result Date: 02/17/2018 CLINICAL DATA:  Fever. History of COPD, hypertension and breast cancer. Former smoker. EXAM: CHEST - 2 VIEW COMPARISON:  01/31/2018; 08/06/2017 FINDINGS: Grossly unchanged borderline enlarged cardiac silhouette given reduced lung volumes and AP projection. Grossly unchanged mild tortuosity and potential ectasia of the thoracic aorta. There is persistent thickening the right paratracheal stripe, presumably secondary prominent vasculature. There is persistent mild elevation/eventration of the right hemidiaphragm. No focal airspace opacities. No pleural effusion or pneumothorax. No evidence of edema. Post lower cervical paraspinal fusion, incompletely evaluated. IMPRESSION: Cardiomegaly and hypoventilation without acute cardiopulmonary disease. Electronically Signed   By: Sandi Mariscal M.D.   On: 02/17/2018 09:02   Chest 2 View  Result Date: 02/01/2018 CLINICAL DATA:  Preoperative respiratory evaluation prior to unspecified LEFT knee surgery scheduled for 02/14/2018. Former smoker. Personal history of RIGHT breast cancer with prior lumpectomy. EXAM: CHEST - 2 VIEW COMPARISON:  Portable chest x-ray 08/06/2017. FINDINGS: Cardiac silhouette normal in size, unchanged. Thoracic aorta tortuous and minimally atherosclerotic. Hilar and mediastinal contours otherwise unremarkable. Eventration of the RIGHT ANTERIOR hemidiaphragm as noted previously. Lungs clear. Bronchovascular markings normal. Pulmonary vascularity normal. No visible pleural effusions. No pneumothorax. Degenerative changes involving the thoracic spine. IMPRESSION: No acute cardiopulmonary disease. Electronically Signed   By: Evangeline Dakin M.D.   On: 02/01/2018 08:20   Dg Knee Left Port  Result Date: 02/14/2018 CLINICAL DATA:  Total knee arthroplasty left EXAM: PORTABLE LEFT KNEE - 1-2  VIEW COMPARISON:  None. FINDINGS: Total knee replacement in satisfactory position alignment. No fracture or acute complication. Gas in the joint and surrounding soft tissues. IMPRESSION: Satisfactory total knee replacement. Electronically Signed   By: Franchot Gallo M.D.   On: 02/14/2018 13:26    ASSESSMENT: Iron deficiency anemia  PLAN:    1.  Iron deficiency anemia: Likely secondary to GI blood loss.  Upper endoscopy completed on July 20, 2017 revealed 2 nonbleeding duodenal angioectasia that were treated with argon laser.  Colonoscopy on the same date was essentially within normal limits except for 2 nonmalignant polyps.  Capsule endoscopy on August 08, 2017 revealed an additional nonbleeding angioectasia in the jejunum as well as a stomach that were also treated with  argon laser.  Patient's hemoglobin and iron stores are now within normal limits.  She does not require additional IV Feraheme today.  Patient last received IV Feraheme on August 28, 2017.  Return to clinic in 3 months with repeat laboratory work and further evaluation.  I spent a total of 2 graduate 0 minutes face-to-face with the patient of which greater than 50% of the visit was spent in counseling and coordination of care as summarized above.  Patient expressed understanding and was in agreement with this plan. She also understands that She can call clinic at any time with any questions, concerns, or complaints.    Lloyd Huger, MD   02/24/2018 10:40 PM

## 2018-02-27 ENCOUNTER — Inpatient Hospital Stay: Payer: Medicare HMO | Admitting: Oncology

## 2018-02-27 ENCOUNTER — Inpatient Hospital Stay: Payer: Medicare HMO

## 2018-04-24 ENCOUNTER — Ambulatory Visit: Payer: Medicare HMO | Admitting: Gastroenterology

## 2018-06-27 ENCOUNTER — Other Ambulatory Visit: Payer: Self-pay | Admitting: Family Medicine

## 2018-06-27 DIAGNOSIS — Z1231 Encounter for screening mammogram for malignant neoplasm of breast: Secondary | ICD-10-CM

## 2018-06-27 DIAGNOSIS — Z0000 Encounter for general adult medical examination without abnormal findings: Secondary | ICD-10-CM

## 2018-07-02 ENCOUNTER — Encounter: Payer: Self-pay | Admitting: Family Medicine

## 2018-07-22 ENCOUNTER — Other Ambulatory Visit: Payer: Medicare HMO

## 2018-07-25 ENCOUNTER — Other Ambulatory Visit: Payer: Self-pay | Admitting: Orthopedic Surgery

## 2018-07-29 ENCOUNTER — Inpatient Hospital Stay: Admission: RE | Admit: 2018-07-29 | Payer: Medicare HMO | Source: Ambulatory Visit

## 2018-07-31 ENCOUNTER — Other Ambulatory Visit: Payer: Medicare HMO

## 2018-08-01 ENCOUNTER — Ambulatory Visit: Admit: 2018-08-01 | Payer: Medicare HMO | Admitting: Orthopedic Surgery

## 2018-08-01 SURGERY — CARPAL TUNNEL RELEASE
Anesthesia: Choice | Laterality: Right

## 2018-09-23 ENCOUNTER — Emergency Department: Payer: Medicare HMO

## 2018-09-23 ENCOUNTER — Emergency Department
Admission: EM | Admit: 2018-09-23 | Discharge: 2018-09-23 | Disposition: A | Discharge status: Home / Self Care | Payer: Medicare HMO | Attending: Emergency Medicine | Admitting: Emergency Medicine

## 2018-09-23 ENCOUNTER — Encounter: Payer: Self-pay | Admitting: Emergency Medicine

## 2018-09-23 ENCOUNTER — Other Ambulatory Visit: Payer: Self-pay

## 2018-09-23 DIAGNOSIS — I10 Essential (primary) hypertension: Secondary | ICD-10-CM | POA: Diagnosis not present

## 2018-09-23 DIAGNOSIS — Z96652 Presence of left artificial knee joint: Secondary | ICD-10-CM | POA: Insufficient documentation

## 2018-09-23 DIAGNOSIS — J449 Chronic obstructive pulmonary disease, unspecified: Secondary | ICD-10-CM | POA: Insufficient documentation

## 2018-09-23 DIAGNOSIS — Z79899 Other long term (current) drug therapy: Secondary | ICD-10-CM | POA: Insufficient documentation

## 2018-09-23 DIAGNOSIS — Z853 Personal history of malignant neoplasm of breast: Secondary | ICD-10-CM | POA: Diagnosis not present

## 2018-09-23 DIAGNOSIS — G43009 Migraine without aura, not intractable, without status migrainosus: Secondary | ICD-10-CM | POA: Diagnosis not present

## 2018-09-23 DIAGNOSIS — Z87891 Personal history of nicotine dependence: Secondary | ICD-10-CM | POA: Insufficient documentation

## 2018-09-23 DIAGNOSIS — R51 Headache: Secondary | ICD-10-CM | POA: Diagnosis present

## 2018-09-23 MED ORDER — SUMATRIPTAN SUCCINATE 6 MG/0.5ML ~~LOC~~ SOLN
6.0000 mg | Freq: Once | SUBCUTANEOUS | Status: AC
  Administered 2018-09-23: 6 mg via SUBCUTANEOUS
  Filled 2018-09-23: qty 0.5

## 2018-09-23 MED ORDER — ASPIRIN-ACETAMINOPHEN-CAFFEINE 250-250-65 MG PO TABS: 1.0000 | ORAL_TABLET | Freq: Every day | ORAL | 0 refills | Status: DC | PRN

## 2018-09-23 MED ORDER — TRAMADOL HCL 50 MG PO TABS: 50.0000 mg | ORAL_TABLET | Freq: Four times a day (QID) | ORAL | 0 refills | Status: AC | PRN

## 2018-09-23 NOTE — ED Triage Notes (Signed)
Migraine x 2 weeks. Denies head injury. Denies use of blood thinners. History of blood thinners.

## 2018-09-23 NOTE — Discharge Instructions (Signed)
Please pick up your prescription from the pharmacy that your primary care provider called in.  Please follow-up with her this week.  Return to the emergency department if headache worsens.

## 2018-09-23 NOTE — ED Provider Notes (Signed)
[facility] Emergency Department Provider Note  ____________________________________________  Time seen: Approximately 1:13 PM  I have reviewed the triage vital signs and the nursing notes.   HISTORY  Chief Complaint Migraine    HPI Hailey Farrell is a 68 y.o. female that presents emergency department for evaluation of migraine on and off for 2 weeks.  Patient states that she has had migraines since she was 68 years old.  She takes Imitrex for migraines but ran out of medication on Saturday.  She called her primary care doctor today who refilled her prescription.  She also called the pharmacy who said that her prescription would not be ready until 2 PM.  She came to the emergency department hoping to get medication sooner than she could get the prescription.  Patient rates pain as a 7 out of 10 currently.  She states that when her migraines get bad pain can be up to a 20.  This is not the worst migraine she has ever had.   She is hoping to receive her medication and go home and go to sleep.  Her primary care provider called her in a prescription for propranolol for hopeful prevention of her migraines.  No recent illness.  No fevers, dizziness, weakness.  Past Medical History:  Diagnosis Date  . Anemia   . Arthritis   . Breast cancer ([place]) 1990   Right  . COPD (chronic obstructive pulmonary disease) ([place])   . Dyspnea    DOE  . Elevated lipids   . Hypertension   . Lower extremity edema   . Migraines   . Migraines   . Restless leg syndrome   . Rotator cuff injury     Patient Active Problem List   Diagnosis Date Noted  . History of total knee arthroplasty, left 02/14/2018  . History of total knee arthroplasty 02/14/2018  . Intestinal metaplasia of gastric mucosa   . Columnar-lined esophagus   . Hiatal hernia   . H pylori ulcer   . Iron deficiency anemia 08/19/2017  . Acute post-hemorrhagic anemia 08/06/2017  . Acute peptic ulcer of stomach   . Common  duodenal ulcer   . Personal history of colonic polyps   . Polyp of sigmoid colon   . Internal hemorrhoids   . Intestinal lump   . GIB (gastrointestinal bleeding) 04/11/2017  . S/P rotator cuff repair 03/01/2016    Past Surgical History:  Procedure Laterality Date  . ABDOMINAL HYSTERECTOMY    . BACK SURGERY  08/2017   CERVICAL FUSION  . BREAST BIOPSY Right 1990   LUMPECTOMY.  took lymph nodes  . COLONOSCOPY WITH PROPOFOL N/A 07/20/2017   Procedure: COLONOSCOPY WITH PROPOFOL;  Surgeon: Virgel Manifold, MD;  Location: [facility] ENDOSCOPY;  Service: Endoscopy;  Laterality: N/A;  . ENTEROSCOPY N/A 08/08/2017   Procedure: ENTEROSCOPY;  Surgeon: Lin Landsman, MD;  Location: [facility] ENDOSCOPY;  Service: Gastroenterology;  Laterality: N/A;  . ESOPHAGOGASTRODUODENOSCOPY Left 04/13/2017   Procedure: ESOPHAGOGASTRODUODENOSCOPY (EGD);  Surgeon: Virgel Manifold, MD;  Location: [facility] ENDOSCOPY;  Service: Endoscopy;  Laterality: Left;  . ESOPHAGOGASTRODUODENOSCOPY (EGD) WITH PROPOFOL N/A 07/20/2017   Procedure: ESOPHAGOGASTRODUODENOSCOPY (EGD) WITH PROPOFOL;  Surgeon: Virgel Manifold, MD;  Location: [facility] ENDOSCOPY;  Service: Endoscopy;  Laterality: N/A;  . ESOPHAGOGASTRODUODENOSCOPY (EGD) WITH PROPOFOL N/A 12/12/2017   Procedure: ESOPHAGOGASTRODUODENOSCOPY (EGD) WITH PROPOFOL;  Surgeon: Virgel Manifold, MD;  Location: [facility] ENDOSCOPY;  Service: Endoscopy;  Laterality: N/A;  . MEDIAL PARTIAL KNEE REPLACEMENT Left 1990  torn ligament. no knee replacement at that time  . PARTIAL HYSTERECTOMY    . SHOULDER ARTHROSCOPY WITH OPEN ROTATOR CUFF REPAIR Left 03/01/2016   Procedure: SHOULDER ARTHROSCOPY WITH OPEN ROTATOR CUFF REPAIR;  Surgeon: Thornton Park, MD;  Location: [facility] ORS;  Service: Orthopedics;  Laterality: Left;  . TOTAL KNEE ARTHROPLASTY Left 02/14/2018   Procedure: TOTAL KNEE ARTHROPLASTY;  Surgeon: Thornton Park, MD;  Location: [facility] ORS;  Service: Orthopedics;  Laterality: Left;     Prior to Admission medications   Medication Sig Start Date End Date Taking? Authorizing Provider  budesonide-formoterol (SYMBICORT) 160-4.5 MCG/ACT inhaler Inhale 2 puffs into the lungs daily as needed (wheezing or shortness of breath).    Yes [provider]  furosemide (LASIX) 20 MG tablet Take 20 mg daily by mouth.    Yes [provider]  lisinopril (PRINIVIL,ZESTRIL) 20 MG tablet Take 20 mg by mouth daily.   Yes [provider]  potassium chloride (MICRO-K) 10 MEQ CR capsule Take 10 mEq by mouth daily.   Yes [provider]  propranolol (INDERAL) 60 MG tablet Take 60 mg by mouth daily.   Yes [provider]  rOPINIRole (REQUIP) 0.5 MG tablet Take 0.5 mg by mouth at bedtime as needed (restless leg syndrome).    Yes [provider]  simvastatin (ZOCOR) 20 MG tablet Take 20 mg by mouth daily.    Yes [provider]  SUMAtriptan (IMITREX) 6 MG/0.5ML SOLN injection Inject 6 mg every 2 (two) hours as needed into the skin for migraine or headache. May repeat in 2 hours if headache persists or recurs.   Yes [provider]  tiotropium (SPIRIVA) 18 MCG inhalation capsule Place 18 mcg into inhaler and inhale daily.   Yes [provider]  vitamin B-12 1000 MCG tablet Take 1 tablet (1,000 mcg total) by mouth daily. 08/09/17  Yes Wieting, Richard, MD  traMADol (ULTRAM) 50 MG tablet Take 1 tablet (50 mg total) by mouth every 6 (six) hours as needed. 09/23/18 09/23/19  Laban Emperor, PA-C    Allergies Latex  Family History  Problem Relation Age of Onset  . CAD Mother   . CAD Sister     Social History Social History   Tobacco Use  . Smoking status: Former Smoker    Packs/day: 0.10    Years: 50.00    Pack years: 5.00    Types: Cigarettes    Last attempt to quit: 03/13/2016    Years since quitting: 2.5  . Smokeless tobacco: Never Used  Substance Use Topics  . Alcohol use: No  . Drug use: No     Review of  Systems  Constitutional: No fever/chills ENT: No upper respiratory complaints. Cardiovascular: No chest pain. Respiratory: No cough. No SOB. Gastrointestinal: No abdominal pain.  No nausea, no vomiting.  Musculoskeletal: Negative for musculoskeletal pain. Skin: Negative for rash, abrasions, lacerations, ecchymosis. Neurological: Negative for numbness or tingling. Positive for migraine.   ____________________________________________   PHYSICAL EXAM:  VITAL SIGNS: ED Triage Vitals  Enc Vitals Group     BP 09/23/18 1248 (!) 129/99     Pulse Rate 09/23/18 1248 71     Resp 09/23/18 1248 18     Temp 09/23/18 1248 (!) 97.5 F (36.4 C)     Temp Source 09/23/18 1248 Oral     SpO2 09/23/18 1248 100 %     Weight 09/23/18 1249 180 lb (81.6 kg)     Height 09/23/18 1249 5\' 4"  (1.626 m)  Head Circumference --      Peak Flow --      Pain Score 09/23/18 1249 8     Pain Loc --      Pain Edu? --      Excl. in [place]? --      Constitutional: Alert and oriented. Well appearing and in no acute distress. Eyes: Conjunctivae are normal. PERRL. EOMI. Head: Atraumatic. ENT:      Ears:      Nose: No congestion/rhinnorhea.      Mouth/Throat: Mucous membranes are moist.  Neck: No stridor.  Cardiovascular: Normal rate, regular rhythm.  Good peripheral circulation. Respiratory: Normal respiratory effort without tachypnea or retractions. Lungs CTAB. Good air entry to the bases with no decreased or absent breath sounds. Gastrointestinal: Bowel sounds 4 quadrants. Soft and nontender to palpation. No guarding or rigidity. No palpable masses. No distention.  Musculoskeletal: Full range of motion to all extremities. No gross deformities appreciated. Normal gait. Neurologic:  Normal speech and language. No gross focal neurologic deficits are appreciated.  Skin:  Skin is warm, dry and intact. No rash noted. Psychiatric: Mood and affect are normal. Speech and behavior are normal. Patient exhibits  appropriate insight and judgement.   ____________________________________________   LABS (all labs ordered are listed, but only abnormal results are displayed)  Labs Reviewed - No data to display ____________________________________________  EKG   ____________________________________________  RADIOLOGY Robinette Haines, personally viewed and evaluated these images (plain radiographs) as part of my medical decision making, as well as reviewing the written report by the radiologist.  Ct Head Wo Contrast  Result Date: 09/23/2018 CLINICAL DATA:  Migraine-type headache for approximately 2 weeks. EXAM: CT HEAD WITHOUT CONTRAST TECHNIQUE: Contiguous axial images were obtained from the base of the skull through the vertex without intravenous contrast. COMPARISON:  None. FINDINGS: Brain: The ventricles and sulci are within normal limits for age. There is no intracranial mass, hemorrhage, extra-axial fluid collection, or midline shift. There are foci of patchy small vessel disease in the centra semiovale bilaterally. Elsewhere brain parenchyma appears unremarkable. No evident acute infarct. Vascular: No hyperdense vessel. There is calcification in each carotid siphon region. Skull: The bony calvarium appears intact. Sinuses/Orbits: There is a small retention cyst in the left lateral sphenoid region. There is mucosal thickening in several ethmoid air cells. Visualized orbits appear symmetric bilaterally. Other: Mastoid air cells are clear. IMPRESSION: Patchy periventricular small vessel disease. No acute infarct. No mass or hemorrhage. There are foci of arterial vascular calcification. There is mild paranasal sinus disease at several sites. Electronically Signed   By: Lowella Grip III M.D.   On: 09/23/2018 13:48    ____________________________________________    PROCEDURES  Procedure(s) performed:    Procedures    Medications  SUMAtriptan (IMITREX) injection 6 mg (6 mg Subcutaneous  Given 09/23/18 1352)     ____________________________________________   INITIAL IMPRESSION / ASSESSMENT AND PLAN / ED COURSE  Pertinent labs & imaging results that were available during my care of the patient were reviewed by me and considered in my medical decision making (see chart for details).  Review of the [place] CSRS was performed in accordance of the [place] prior to dispensing any controlled drugs.     Patient presented to the emergency department requesting Imitrex for migraine.  Migraine feels exactly the same as previous migraines.  CT negative for acute abnormalities.  Imitrex was given and migraine completely resolved and now rates her pain is a 0.  Patient has called  the pharmacy where her Imitrex prescription is and states that her insurance company has not authorized a refill currently.  She is waiting for a call back from her primary care provider to do a preauthorization for her Imitrex.  She was given a short course of tramadol to help her tonight until she hears back from primary care.  She will also continue taking Tylenol.  Patient is to follow up with primary care as directed. Patient is given ED precautions to return to the ED for any worsening or new symptoms.     ____________________________________________  FINAL CLINICAL IMPRESSION(S) / ED DIAGNOSES  Final diagnoses:  Migraine without aura and without status migrainosus, not intractable      NEW MEDICATIONS STARTED DURING THIS VISIT:  ED Discharge Orders         Ordered    traMADol (ULTRAM) 50 MG tablet  Every 6 hours PRN     09/23/18 1444    aspirin-acetaminophen-caffeine (EXCEDRIN MIGRAINE) 250-250-65 MG tablet  Daily PRN,   Status:  Discontinued     09/23/18 1444              This chart was dictated using voice recognition software/Dragon. Despite best efforts to proofread, errors can occur which can change the meaning. Any change was purely unintentional.    Laban Emperor, PA-C 09/23/18  1539    Lavonia Drafts, MD 09/26/18 1515

## 2018-09-23 NOTE — ED Notes (Signed)
Says she has migraine for 2 weeks.  She is out of shots.  Last dose was Saturday.  Feels like her usual migraines.  Electronics engineer.

## 2018-09-23 NOTE — ED Notes (Signed)
Says sheis going to get refill of imitrex later today.  Says her doctor sent her here thinking she could get a shot until her prescription is ready.  Patient is tearful at times.

## 2019-11-07 ENCOUNTER — Other Ambulatory Visit: Payer: Self-pay | Admitting: Family Medicine

## 2019-11-07 DIAGNOSIS — Z1231 Encounter for screening mammogram for malignant neoplasm of breast: Secondary | ICD-10-CM

## 2019-11-07 DIAGNOSIS — Z13820 Encounter for screening for osteoporosis: Secondary | ICD-10-CM

## 2019-12-01 ENCOUNTER — Inpatient Hospital Stay
Admission: RE | Admit: 2019-12-01 | Discharge: 2019-12-01 | Disposition: A | Discharge status: Home / Self Care | Payer: Self-pay | Source: Ambulatory Visit | Attending: *Deleted | Admitting: *Deleted

## 2019-12-01 ENCOUNTER — Other Ambulatory Visit: Payer: Self-pay | Admitting: *Deleted

## 2019-12-01 DIAGNOSIS — Z1231 Encounter for screening mammogram for malignant neoplasm of breast: Secondary | ICD-10-CM

## 2019-12-17 ENCOUNTER — Ambulatory Visit
Admission: RE | Admit: 2019-12-17 | Discharge: 2019-12-17 | Disposition: A | Discharge status: Home / Self Care | Payer: Medicare HMO | Source: Ambulatory Visit | Attending: Family Medicine | Admitting: Family Medicine

## 2019-12-17 DIAGNOSIS — Z13820 Encounter for screening for osteoporosis: Secondary | ICD-10-CM

## 2019-12-17 DIAGNOSIS — Z780 Asymptomatic menopausal state: Secondary | ICD-10-CM | POA: Insufficient documentation

## 2019-12-17 DIAGNOSIS — Z1231 Encounter for screening mammogram for malignant neoplasm of breast: Secondary | ICD-10-CM

## 2020-05-06 ENCOUNTER — Encounter: Payer: Self-pay | Admitting: Emergency Medicine

## 2020-05-06 ENCOUNTER — Inpatient Hospital Stay
Admission: EM | Admit: 2020-05-06 | Discharge: 2020-05-08 | DRG: 379 | Disposition: A | Discharge status: Home / Self Care | Payer: Medicare HMO | Source: Ambulatory Visit | Attending: Internal Medicine | Admitting: Internal Medicine

## 2020-05-06 ENCOUNTER — Other Ambulatory Visit: Payer: Self-pay

## 2020-05-06 DIAGNOSIS — Q2730 Arteriovenous malformation, site unspecified: Secondary | ICD-10-CM

## 2020-05-06 DIAGNOSIS — M1990 Unspecified osteoarthritis, unspecified site: Secondary | ICD-10-CM | POA: Diagnosis present

## 2020-05-06 DIAGNOSIS — Z20822 Contact with and (suspected) exposure to covid-19: Secondary | ICD-10-CM | POA: Diagnosis present

## 2020-05-06 DIAGNOSIS — I10 Essential (primary) hypertension: Secondary | ICD-10-CM | POA: Diagnosis present

## 2020-05-06 DIAGNOSIS — K648 Other hemorrhoids: Secondary | ICD-10-CM | POA: Diagnosis present

## 2020-05-06 DIAGNOSIS — D509 Iron deficiency anemia, unspecified: Secondary | ICD-10-CM | POA: Diagnosis not present

## 2020-05-06 DIAGNOSIS — Z8249 Family history of ischemic heart disease and other diseases of the circulatory system: Secondary | ICD-10-CM

## 2020-05-06 DIAGNOSIS — K449 Diaphragmatic hernia without obstruction or gangrene: Secondary | ICD-10-CM | POA: Diagnosis present

## 2020-05-06 DIAGNOSIS — J449 Chronic obstructive pulmonary disease, unspecified: Secondary | ICD-10-CM | POA: Diagnosis present

## 2020-05-06 DIAGNOSIS — K5521 Angiodysplasia of colon with hemorrhage: Secondary | ICD-10-CM

## 2020-05-06 DIAGNOSIS — K31811 Angiodysplasia of stomach and duodenum with bleeding: Principal | ICD-10-CM | POA: Diagnosis present

## 2020-05-06 DIAGNOSIS — G2581 Restless legs syndrome: Secondary | ICD-10-CM | POA: Diagnosis present

## 2020-05-06 DIAGNOSIS — R109 Unspecified abdominal pain: Secondary | ICD-10-CM

## 2020-05-06 DIAGNOSIS — R42 Dizziness and giddiness: Secondary | ICD-10-CM | POA: Diagnosis present

## 2020-05-06 DIAGNOSIS — Z91040 Latex allergy status: Secondary | ICD-10-CM

## 2020-05-06 DIAGNOSIS — R600 Localized edema: Secondary | ICD-10-CM | POA: Diagnosis present

## 2020-05-06 DIAGNOSIS — Z96652 Presence of left artificial knee joint: Secondary | ICD-10-CM | POA: Diagnosis present

## 2020-05-06 DIAGNOSIS — K254 Chronic or unspecified gastric ulcer with hemorrhage: Secondary | ICD-10-CM | POA: Diagnosis present

## 2020-05-06 DIAGNOSIS — F17210 Nicotine dependence, cigarettes, uncomplicated: Secondary | ICD-10-CM | POA: Diagnosis present

## 2020-05-06 DIAGNOSIS — R55 Syncope and collapse: Secondary | ICD-10-CM | POA: Diagnosis present

## 2020-05-06 DIAGNOSIS — Z7951 Long term (current) use of inhaled steroids: Secondary | ICD-10-CM

## 2020-05-06 DIAGNOSIS — D649 Anemia, unspecified: Secondary | ICD-10-CM | POA: Diagnosis present

## 2020-05-06 DIAGNOSIS — Z923 Personal history of irradiation: Secondary | ICD-10-CM

## 2020-05-06 DIAGNOSIS — G43909 Migraine, unspecified, not intractable, without status migrainosus: Secondary | ICD-10-CM | POA: Diagnosis present

## 2020-05-06 DIAGNOSIS — Z79899 Other long term (current) drug therapy: Secondary | ICD-10-CM | POA: Diagnosis not present

## 2020-05-06 DIAGNOSIS — D126 Benign neoplasm of colon, unspecified: Secondary | ICD-10-CM | POA: Diagnosis present

## 2020-05-06 DIAGNOSIS — K31A0 Gastric intestinal metaplasia, unspecified: Secondary | ICD-10-CM | POA: Diagnosis present

## 2020-05-06 DIAGNOSIS — E785 Hyperlipidemia, unspecified: Secondary | ICD-10-CM | POA: Diagnosis present

## 2020-05-06 DIAGNOSIS — Z853 Personal history of malignant neoplasm of breast: Secondary | ICD-10-CM

## 2020-05-06 DIAGNOSIS — D500 Iron deficiency anemia secondary to blood loss (chronic): Secondary | ICD-10-CM | POA: Diagnosis not present

## 2020-05-06 LAB — COMPREHENSIVE METABOLIC PANEL
ALT: 14 U/L (ref 0–44)
AST: 19 U/L (ref 15–41)
Albumin: 3.7 g/dL (ref 3.5–5.0)
Alkaline Phosphatase: 30 U/L — ABNORMAL LOW (ref 38–126)
Anion gap: 7 (ref 5–15)
BUN: 10 mg/dL (ref 8–23)
CO2: 23 mmol/L (ref 22–32)
Calcium: 8.2 mg/dL — ABNORMAL LOW (ref 8.9–10.3)
Chloride: 106 mmol/L (ref 98–111)
Creatinine, Ser: 0.98 mg/dL (ref 0.44–1.00)
GFR, Estimated: >60 mL/min (ref >60)
Glucose, Bld: 131 mg/dL — ABNORMAL HIGH (ref 70–99)
Potassium: 4 mmol/L (ref 3.5–5.1)
Sodium: 136 mmol/L (ref 135–145)
Total Bilirubin: 0.3 mg/dL (ref 0.3–1.2)
Total Protein: 6.2 g/dL — ABNORMAL LOW (ref 6.5–8.1)

## 2020-05-06 LAB — URINALYSIS, COMPLETE (UACMP) WITH MICROSCOPIC
Bilirubin Urine: NEGATIVE
Glucose, UA: NEGATIVE mg/dL
Hgb urine dipstick: NEGATIVE
Ketones, ur: NEGATIVE mg/dL
Nitrite: NEGATIVE
Protein, ur: NEGATIVE mg/dL
Specific Gravity, Urine: 1.004 — ABNORMAL LOW (ref 1.005–1.030)
pH: 6 (ref 5.0–8.0)

## 2020-05-06 LAB — CBC
HCT: 13.7 % — CL (ref 36.0–46.0)
HCT: 26.2 % — ABNORMAL LOW (ref 36.0–46.0)
Hemoglobin: 3.7 g/dL — CL (ref 12.0–15.0)
Hemoglobin: 8.1 g/dL — ABNORMAL LOW (ref 12.0–15.0)
MCH: 16.1 pg — ABNORMAL LOW (ref 26.0–34.0)
MCH: 21.5 pg — ABNORMAL LOW (ref 26.0–34.0)
MCHC: 27 g/dL — ABNORMAL LOW (ref 30.0–36.0)
MCHC: 30.9 g/dL (ref 30.0–36.0)
MCV: 59.6 fL — ABNORMAL LOW (ref 80.0–100.0)
MCV: 69.5 fL — ABNORMAL LOW (ref 80.0–100.0)
Platelets: 371 10*3/uL (ref 150–400)
Platelets: 332 10*3/uL (ref 150–400)
RBC: 2.3 MIL/uL — ABNORMAL LOW (ref 3.87–5.11)
RBC: 3.77 MIL/uL — ABNORMAL LOW (ref 3.87–5.11)
RDW: 19.5 % — ABNORMAL HIGH (ref 11.5–15.5)
RDW: 27.6 % — ABNORMAL HIGH (ref 11.5–15.5)
WBC: 5.1 10*3/uL (ref 4.0–10.5)
WBC: 5.7 10*3/uL (ref 4.0–10.5)
nRBC: 0.4 % — ABNORMAL HIGH (ref 0.0–0.2)
nRBC: 0.4 % — ABNORMAL HIGH (ref 0.0–0.2)

## 2020-05-06 LAB — CBC WITH DIFFERENTIAL/PLATELET
Abs Immature Granulocytes: 0.01 10*3/uL (ref 0.00–0.07)
Basophils Absolute: 0 10*3/uL (ref 0.0–0.1)
Basophils Relative: 0 %
Eosinophils Absolute: 0.1 10*3/uL (ref 0.0–0.5)
Eosinophils Relative: 1 %
HCT: 14.2 % — CL (ref 36.0–46.0)
Hemoglobin: 3.8 g/dL — CL (ref 12.0–15.0)
Immature Granulocytes: 0 %
Lymphocytes Relative: 32 %
Lymphs Abs: 1.5 10*3/uL (ref 0.7–4.0)
MCH: 16 pg — ABNORMAL LOW (ref 26.0–34.0)
MCHC: 26.8 g/dL — ABNORMAL LOW (ref 30.0–36.0)
MCV: 59.9 fL — ABNORMAL LOW (ref 80.0–100.0)
Monocytes Absolute: 0.5 10*3/uL (ref 0.1–1.0)
Monocytes Relative: 10 %
Neutro Abs: 2.7 10*3/uL (ref 1.7–7.7)
Neutrophils Relative %: 57 %
Platelets: 397 10*3/uL (ref 150–400)
RBC: 2.37 MIL/uL — ABNORMAL LOW (ref 3.87–5.11)
RDW: 19.3 % — ABNORMAL HIGH (ref 11.5–15.5)
WBC: 4.8 10*3/uL (ref 4.0–10.5)
nRBC: 0.6 % — ABNORMAL HIGH (ref 0.0–0.2)

## 2020-05-06 LAB — BASIC METABOLIC PANEL
Anion gap: 9 (ref 5–15)
BUN: 10 mg/dL (ref 8–23)
CO2: 23 mmol/L (ref 22–32)
Calcium: 8.4 mg/dL — ABNORMAL LOW (ref 8.9–10.3)
Chloride: 106 mmol/L (ref 98–111)
Creatinine, Ser: 0.98 mg/dL (ref 0.44–1.00)
GFR, Estimated: >60 mL/min (ref >60)
Glucose, Bld: 108 mg/dL — ABNORMAL HIGH (ref 70–99)
Potassium: 4.3 mmol/L (ref 3.5–5.1)
Sodium: 138 mmol/L (ref 135–145)

## 2020-05-06 LAB — IRON AND TIBC
Iron: 12 ug/dL — ABNORMAL LOW (ref 28–170)
Saturation Ratios: 2 % — ABNORMAL LOW (ref 10.4–31.8)
TIBC: 493 ug/dL — ABNORMAL HIGH (ref 250–450)
UIBC: 481 ug/dL

## 2020-05-06 LAB — RETICULOCYTES
Immature Retic Fract: 22.6 % — ABNORMAL HIGH (ref 2.3–15.9)
RBC.: 2.4 MIL/uL — ABNORMAL LOW (ref 3.87–5.11)
Retic Count, Absolute: 31.2 10*3/uL (ref 19.0–186.0)
Retic Ct Pct: 1.3 % (ref 0.4–3.1)

## 2020-05-06 LAB — PROTIME-INR
INR: 1.1 (ref 0.8–1.2)
Prothrombin Time: 13.3 seconds (ref 11.4–15.2)

## 2020-05-06 LAB — TROPONIN I (HIGH SENSITIVITY)
Troponin I (High Sensitivity): 10 ng/L (ref <18)
Troponin I (High Sensitivity): 10 ng/L (ref <18)

## 2020-05-06 LAB — FERRITIN: Ferritin: 2 ng/mL — ABNORMAL LOW (ref 11–307)

## 2020-05-06 LAB — RESP PANEL BY RT-PCR (FLU A&B, COVID) ARPGX2
Influenza A by PCR: NEGATIVE
Influenza B by PCR: NEGATIVE
SARS Coronavirus 2 by RT PCR: NEGATIVE

## 2020-05-06 LAB — HIV ANTIBODY (ROUTINE TESTING W REFLEX): HIV Screen 4th Generation wRfx: NONREACTIVE

## 2020-05-06 LAB — PREPARE RBC (CROSSMATCH)

## 2020-05-06 MED ORDER — SODIUM CHLORIDE 0.9% IV SOLUTION
Freq: Once | INTRAVENOUS | Status: DC
  Filled 2020-05-06: qty 250

## 2020-05-06 MED ORDER — PROPRANOLOL HCL 40 MG PO TABS
60.0000 mg | ORAL_TABLET | Freq: Every day | ORAL | Status: DC
  Administered 2020-05-07: 60 mg via ORAL
  Filled 2020-05-06: qty 3
  Filled 2020-05-06 (×2): qty 1

## 2020-05-06 MED ORDER — TIOTROPIUM BROMIDE MONOHYDRATE 18 MCG IN CAPS
18.0000 ug | ORAL_CAPSULE | Freq: Every morning | RESPIRATORY_TRACT | Status: DC
  Administered 2020-05-07: 10:00:00 18 ug via RESPIRATORY_TRACT
  Filled 2020-05-06 (×2): qty 5

## 2020-05-06 MED ORDER — POTASSIUM CHLORIDE CRYS ER 10 MEQ PO TBCR
10.0000 meq | EXTENDED_RELEASE_TABLET | Freq: Every day | ORAL | Status: DC
  Administered 2020-05-06 – 2020-05-07 (×2): 10 meq via ORAL
  Filled 2020-05-06 (×2): qty 1

## 2020-05-06 MED ORDER — PANTOPRAZOLE SODIUM 40 MG IV SOLR
40.0000 mg | Freq: Two times a day (BID) | INTRAVENOUS | Status: DC
  Administered 2020-05-06 – 2020-05-07 (×5): 40 mg via INTRAVENOUS
  Filled 2020-05-06 (×4): qty 40

## 2020-05-06 MED ORDER — ROPINIROLE HCL 1 MG PO TABS
0.5000 mg | ORAL_TABLET | Freq: Every evening | ORAL | Status: DC | PRN
  Administered 2020-05-06 – 2020-05-07 (×3): 0.5 mg via ORAL
  Filled 2020-05-06 (×3): qty 1

## 2020-05-06 MED ORDER — FUROSEMIDE 20 MG PO TABS
20.0000 mg | ORAL_TABLET | Freq: Every day | ORAL | Status: DC
  Administered 2020-05-06 – 2020-05-07 (×2): 20 mg via ORAL
  Filled 2020-05-06 (×2): qty 1

## 2020-05-06 MED ORDER — SIMVASTATIN 20 MG PO TABS
20.0000 mg | ORAL_TABLET | Freq: Every day | ORAL | Status: DC
  Administered 2020-05-06 – 2020-05-07 (×2): 20 mg via ORAL
  Filled 2020-05-06: qty 1
  Filled 2020-05-06: qty 2

## 2020-05-06 MED ORDER — ONDANSETRON HCL 4 MG/2ML IJ SOLN: 4.0000 mg | Freq: Four times a day (QID) | INTRAMUSCULAR | Status: DC | PRN

## 2020-05-06 MED ORDER — ONDANSETRON HCL 4 MG PO TABS: 4.0000 mg | ORAL_TABLET | Freq: Four times a day (QID) | ORAL | Status: DC | PRN

## 2020-05-06 MED ORDER — SUMATRIPTAN SUCCINATE 6 MG/0.5ML ~~LOC~~ SOLN
6.0000 mg | SUBCUTANEOUS | Status: DC | PRN
  Administered 2020-05-06: 20:00:00 6 mg via SUBCUTANEOUS
  Filled 2020-05-06 (×3): qty 0.5

## 2020-05-06 MED ORDER — TRAZODONE HCL 50 MG PO TABS
25.0000 mg | ORAL_TABLET | Freq: Every evening | ORAL | Status: DC | PRN
  Administered 2020-05-06 – 2020-05-07 (×3): 25 mg via ORAL
  Filled 2020-05-06 (×3): qty 1

## 2020-05-06 MED ORDER — VITAMIN B-12 1000 MCG PO TABS
1000.0000 ug | ORAL_TABLET | Freq: Every day | ORAL | Status: DC
  Administered 2020-05-06 – 2020-05-07 (×2): 1000 ug via ORAL
  Filled 2020-05-06 (×2): qty 1

## 2020-05-06 MED ORDER — SODIUM CHLORIDE 0.9 % IV SOLN
INTRAVENOUS | Status: DC
  Administered 2020-05-07: 100 mL/h via INTRAVENOUS

## 2020-05-06 MED ORDER — ACETAMINOPHEN 650 MG RE SUPP: 650.0000 mg | Freq: Four times a day (QID) | RECTAL | Status: DC | PRN

## 2020-05-06 MED ORDER — LISINOPRIL 20 MG PO TABS
20.0000 mg | ORAL_TABLET | Freq: Every day | ORAL | Status: DC
  Administered 2020-05-06 – 2020-05-07 (×2): 20 mg via ORAL
  Filled 2020-05-06: qty 1
  Filled 2020-05-06: qty 2

## 2020-05-06 MED ORDER — ACETAMINOPHEN 325 MG PO TABS
650.0000 mg | ORAL_TABLET | Freq: Four times a day (QID) | ORAL | Status: DC | PRN
  Administered 2020-05-06: 650 mg via ORAL
  Filled 2020-05-06: qty 2

## 2020-05-06 MED ORDER — SODIUM CHLORIDE 0.9 % IV SOLN
510.0000 mg | Freq: Once | INTRAVENOUS | Status: AC
  Administered 2020-05-07: 510 mg via INTRAVENOUS
  Filled 2020-05-06: qty 17

## 2020-05-06 NOTE — ED Notes (Signed)
First unit of blood transfused without difficulty.  Pt tolerated well, no s/sx of reaction

## 2020-05-06 NOTE — ED Notes (Signed)
Second unit of blood initiated at 142ml/hr

## 2020-05-06 NOTE — Consult Note (Signed)
[place]  [phone] [phone]  ID: BRIELL PAULETTE OB: [birth year]  MR#: [id number]  CBJ#:[id number]  Patient Care Team: Letta Median, MD as PCP - General (Family Medicine)  CHIEF COMPLAINT: Severe iron deficiency anemia.  INTERVAL HISTORY: Patient is a 69 year old female who was last evaluated in the cancer center greater than 2 years ago who initially presented to her primary care physician with worsening shortness of breath and increasing weakness and fatigue.  Her hemoglobin was noted to be 3.8 and she was transferred to the emergency room for evaluation and admission to the hospital.  Upon evaluation, patient had already received 2 units of packed red blood cells and thyroid was pending.  She feels mildly improved, but still has significant weakness and fatigue.  She has no neurologic complaints.  She denies any recent fevers or illnesses.  She has a good appetite and denies weight loss.  She has no chest pain, cough, or hemoptysis. She denies any nausea, vomiting, constipation, or diarrhea.  She has no melena or hematochezia.  She has no urinary complaints.  Patient otherwise feels well and offers no further specific complaints.    REVIEW OF SYSTEMS:   Review of Systems  Constitutional: Positive for malaise/fatigue. Negative for fever and weight loss.  Respiratory: Positive for shortness of breath. Negative for cough and hemoptysis.   Cardiovascular: Negative.  Negative for chest pain and leg swelling.  Gastrointestinal: Negative.  Negative for abdominal pain, blood in stool and melena.  Genitourinary: Negative.  Negative for hematuria.  Musculoskeletal: Negative.  Negative for back pain.  Skin: Negative.  Negative for rash.  Neurological: Positive for weakness. Negative for dizziness, focal weakness and headaches.  Psychiatric/Behavioral: Negative.  The patient is not nervous/anxious.     As per HPI. Otherwise, a complete review of  systems is negative.  PAST MEDICAL HISTORY: Past Medical History:  Diagnosis Date  . Anemia   . Arthritis   . Breast cancer ([place]) 1990   Right  . COPD (chronic obstructive pulmonary disease) ([place])   . Dyspnea    DOE  . Elevated lipids   . Hypertension   . Lower extremity edema   . Migraines   . Migraines   . Personal history of radiation therapy 1990   Breast  . Restless leg syndrome   . Rotator cuff injury     PAST SURGICAL HISTORY: Past Surgical History:  Procedure Laterality Date  . ABDOMINAL HYSTERECTOMY    . BACK SURGERY  08/2017   CERVICAL FUSION  . BREAST BIOPSY Right 1990   LUMPECTOMY.  took lymph nodes  . COLONOSCOPY WITH PROPOFOL N/A 07/20/2017   Procedure: COLONOSCOPY WITH PROPOFOL;  Surgeon: Virgel Manifold, MD;  Location: [facility] ENDOSCOPY;  Service: Endoscopy;  Laterality: N/A;  . ENTEROSCOPY N/A 08/08/2017   Procedure: ENTEROSCOPY;  Surgeon: Lin Landsman, MD;  Location: [facility] ENDOSCOPY;  Service: Gastroenterology;  Laterality: N/A;  . ESOPHAGOGASTRODUODENOSCOPY Left 04/13/2017   Procedure: ESOPHAGOGASTRODUODENOSCOPY (EGD);  Surgeon: Virgel Manifold, MD;  Location: [facility] ENDOSCOPY;  Service: Endoscopy;  Laterality: Left;  . ESOPHAGOGASTRODUODENOSCOPY (EGD) WITH PROPOFOL N/A 07/20/2017   Procedure: ESOPHAGOGASTRODUODENOSCOPY (EGD) WITH PROPOFOL;  Surgeon: Virgel Manifold, MD;  Location: [facility] ENDOSCOPY;  Service: Endoscopy;  Laterality: N/A;  . ESOPHAGOGASTRODUODENOSCOPY (EGD) WITH PROPOFOL N/A 12/12/2017   Procedure: ESOPHAGOGASTRODUODENOSCOPY (EGD) WITH PROPOFOL;  Surgeon: Virgel Manifold, MD;  Location: [facility] ENDOSCOPY;  Service: Endoscopy;  Laterality: N/A;  . MEDIAL PARTIAL KNEE REPLACEMENT Left 1990  torn ligament. no knee replacement at that time  . PARTIAL HYSTERECTOMY    . SHOULDER ARTHROSCOPY WITH OPEN ROTATOR CUFF REPAIR Left 03/01/2016   Procedure: SHOULDER ARTHROSCOPY WITH OPEN ROTATOR CUFF REPAIR;  Surgeon: Thornton Park, MD;   Location: [facility] ORS;  Service: Orthopedics;  Laterality: Left;  . TOTAL KNEE ARTHROPLASTY Left 02/14/2018   Procedure: TOTAL KNEE ARTHROPLASTY;  Surgeon: Thornton Park, MD;  Location: [facility] ORS;  Service: Orthopedics;  Laterality: Left;    FAMILY HISTORY: Family History  Problem Relation Age of Onset  . CAD Mother   . CAD Sister   . Breast cancer Neg Hx     ADVANCED DIRECTIVES (Y/N):  @ADVDIR @  HEALTH MAINTENANCE: Social History   Tobacco Use  . Smoking status: Current Some Day Smoker    Packs/day: 0.10    Years: 50.00    Pack years: 5.00    Types: Cigarettes    Last attempt to quit: 03/13/2016    Years since quitting: 4.1  . Smokeless tobacco: Never Used  Vaping Use  . Vaping Use: Never used  Substance Use Topics  . Alcohol use: No  . Drug use: No     Colonoscopy:  PAP:  Bone density:  Lipid panel:  Allergies  Allergen Reactions  . Latex Rash    Welts over body    Current Facility-Administered Medications  Medication Dose Route Frequency Provider Last Rate Last Admin  . 0.9 %  sodium chloride infusion (Manually program via Guardrails IV Fluids)   Intravenous Once Lucrezia Starch, MD   Held at 05/06/20 [phone]  . 0.9 %  sodium chloride infusion   Intravenous Continuous Mansy, Arvella Merles, MD 100 mL/hr at 05/06/20 0540 New Bag at 05/06/20 0540  . acetaminophen (TYLENOL) tablet 650 mg  650 mg Oral Q6H PRN Mansy, Jan A, MD   650 mg at 05/06/20 1213   Or  . acetaminophen (TYLENOL) suppository 650 mg  650 mg Rectal Q6H PRN Mansy, Jan A, MD      . Derrill Memo ON 05/07/2020] ferumoxytol [facility]) 510 mg in sodium chloride 0.9 % 100 mL IVPB  510 mg Intravenous Once Lorella Nimrod, MD      . furosemide (LASIX) tablet 20 mg  20 mg Oral Daily Lorella Nimrod, MD   20 mg at 05/06/20 1201  . lisinopril (ZESTRIL) tablet 20 mg  20 mg Oral Daily Mansy, Jan A, MD   20 mg at 05/06/20 1202  . ondansetron (ZOFRAN) tablet 4 mg  4 mg Oral Q6H PRN Mansy, Jan A, MD       Or  . ondansetron [facility])  injection 4 mg  4 mg Intravenous Q6H PRN Mansy, Jan A, MD      . pantoprazole (PROTONIX) injection 40 mg  40 mg Intravenous Q12H Mansy, Jan A, MD   40 mg at 05/06/20 1957  . potassium chloride SA (KLOR-CON) CR tablet 10 mEq  10 mEq Oral Daily Mansy, Jan A, MD   10 mEq at 05/06/20 1159  . propranolol (INDERAL) tablet 60 mg  60 mg Oral Daily Mansy, Jan A, MD      . rOPINIRole (REQUIP) tablet 0.5 mg  0.5 mg Oral QHS PRN Mansy, Jan A, MD   0.5 mg at 05/06/20 2026  . simvastatin (ZOCOR) tablet 20 mg  20 mg Oral Daily Mansy, Jan A, MD   20 mg at 05/06/20 1201  . SUMAtriptan (IMITREX) injection 6 mg  6 mg Subcutaneous Q2H PRN Mansy, Arvella Merles, MD  6 mg at 05/06/20 2027  . tiotropium (SPIRIVA) inhalation capsule ([facility] use ONLY) 18 mcg  18 mcg Inhalation q morning - 10a Mansy, Jan A, MD      . traZODone (DESYREL) tablet 25 mg  25 mg Oral QHS PRN Mansy, Jan A, MD   25 mg at 05/06/20 2025  . vitamin B-12 (CYANOCOBALAMIN) tablet 1,000 mcg  1,000 mcg Oral Daily Mansy, Jan A, MD   1,000 mcg at 05/06/20 1203    OBJECTIVE: Vitals:   05/06/20 1734 05/06/20 1923  BP: 136/80 (!) 152/80  Pulse: 90 87  Resp: 18 18  Temp: 98.6 F (37 C) 98.8 F (37.1 C)  SpO2: 97% 100%     Body mass index is 30.9 kg/m.    ECOG FS:1 - Symptomatic but completely ambulatory  General: Well-developed, well-nourished, no acute distress. Eyes: Pink conjunctiva, anicteric sclera. HEENT: Normocephalic, moist mucous membranes. Lungs: No audible wheezing or coughing. Heart: Regular rate and rhythm. Abdomen: Soft, nontender, no obvious distention. Musculoskeletal: No edema, cyanosis, or clubbing. Neuro: Alert, answering all questions appropriately. Cranial nerves grossly intact. Skin: No rashes or petechiae noted. Psych: Normal affect. Lymphatics: No cervical, calvicular, axillary or inguinal LAD.   LAB RESULTS:  Lab Results  Component Value Date   NA 138 05/06/2020   K 4.3 05/06/2020   CL 106 05/06/2020   CO2 23 05/06/2020    GLUCOSE 108 (H) 05/06/2020   BUN 10 05/06/2020   CREATININE 0.98 05/06/2020   CALCIUM 8.4 (L) 05/06/2020   PROT 6.2 (L) 05/06/2020   ALBUMIN 3.7 05/06/2020   AST 19 05/06/2020   ALT 14 05/06/2020   ALKPHOS 30 (L) 05/06/2020   BILITOT 0.3 05/06/2020   GFRNONAA >60 05/06/2020   GFRAA >60 02/16/2018    Lab Results  Component Value Date   WBC 5.7 05/06/2020   NEUTROABS 2.7 05/06/2020   HGB 8.1 (L) 05/06/2020   HCT 26.2 (L) 05/06/2020   MCV 69.5 (L) 05/06/2020   PLT 332 05/06/2020     STUDIES: No results found.  ASSESSMENT: Severe iron deficiency anemia.  PLAN:    1. Severe iron deficiency anemia: Patient's hemoglobin has now improved to 8.1 with 3 units of packed red blood cells.  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  Appreciate GI input.  Plan is to have EGD with push enteroscopy evaluation as early as tomorrow morning.  Colonoscopy is not recommended at this time, but will consider if initial luminal evaluation is negative.  Patient does not require a fourth transfusion at this time.  Continue to monitor and transfuse if hemoglobin begins to trend back down and falls below 7.0.  Appreciate consult, will follow.   Lloyd Huger, MD   05/06/2020 11:11 PM

## 2020-05-06 NOTE — Progress Notes (Addendum)
No charge progress note.  Hailey Farrell  is a 69 y.o. African-American female with a known history of COPD, hypertension, migraine, restless leg syndrome and chronic iron deficiency anemia getting iron transfusions and requiring blood transfusions, with history of focal intestinal metaplasia and H. pylori, who presented to the emergency room with acute onset of dizziness and lightheadedness with presyncope and severe anemia on outpatient lab ordered by her primary care physician.  She admitted to recent fatigue and tiredness as well as dyspnea on exertion.  She denies any nausea or vomiting or diarrhea.  No bleeding diathesis.  No melena or bright red bleeding per rectum.  No chest pain or palpitations.  No cough or wheezing or hemoptysis.  Per patient she had similar symptoms couple of years ago and she underwent EGD, capsule endoscopy and colonoscopy where no obvious source of bleeding was found.  She denies any obvious bleeding and bedside stool card was negative for any occult bleeding.  Patient has low reticulocyte count which can be explained with severe iron deficiency, I checked ferritin and iron levels which were quite low this morning.  Patient also has very low MCV, to have at times MCV within normal limit in the past. Never been worked up for any blood disorder, never followed up with a hematologist.   Per patient she eats a normal healthy diet which includes a variety of food which include green leafy vegetables. She does not take any iron supplement but do take vitamin B12 supplement. Had hysterectomy done 30 years ago and denies any vaginal bleeding.  We will talk with Dr. Bonna Gains whether she needs any other inpatient procedure or she can follow-up with her as an outpatient. She will also get benefit with outpatient hematology work-up.  I will give her a bag of IV iron to fill up her results for iron. We will recheck hemoglobin after getting 3 units of PRBC.  GI wants to do EGD  tomorrow morning as she had an history of AVMs.

## 2020-05-06 NOTE — ED Provider Notes (Signed)
[facility] Emergency Department Provider Note  ____________________________________________   First MD Initiated Contact with Patient 05/06/20 0335     (approximate)  I have reviewed the triage vital signs and the nursing notes.   HISTORY  Chief Complaint Abnormal Lab   HPI Hailey Farrell is a 69 y.o. female the past medical history of COPD, HTN, HDL, migraines, RLS, arthritis, and anemia previously requiring blood transfusions who presents for assessment of several weeks of dizziness after being referred to the ED by her PCP after outpatient labs showed evidence of significant anemia.  Patient states she feels lightheaded and dizzy whenever she stands up and walks and this has been getting worse over the last several weeks.  She denies any other acute sick symptoms including vomiting, diarrhea, dysuria, chest pain, cough, shortness of breath, headache, earache, sore throat, blood in her urine, blood in her stool, dark tarry sticky or black stools or any other bleeding.  She denies being on any blood thinners or taking significance of ibuprofen.  She states she is not sure why she is anemic and that she had a blood transfusion several years ago but is not sure why.  She thinks she may have had polyps taken off her colon but otherwise has never had GI bleeding.  Denies EtOH or illicit drug use.         Past Medical History:  Diagnosis Date  . Anemia   . Arthritis   . Breast cancer ([place]) 1990   Right  . COPD (chronic obstructive pulmonary disease) ([place])   . Dyspnea    DOE  . Elevated lipids   . Hypertension   . Lower extremity edema   . Migraines   . Migraines   . Personal history of radiation therapy 1990   Breast  . Restless leg syndrome   . Rotator cuff injury     Patient Active Problem List   Diagnosis Date Noted  . Symptomatic anemia 05/06/2020  . History of total knee arthroplasty, left 02/14/2018  . History of total knee arthroplasty  02/14/2018  . Intestinal metaplasia of gastric mucosa   . Columnar-lined esophagus   . Hiatal hernia   . H pylori ulcer   . Iron deficiency anemia 08/19/2017  . Acute post-hemorrhagic anemia 08/06/2017  . Acute peptic ulcer of stomach   . Common duodenal ulcer   . Personal history of colonic polyps   . Polyp of sigmoid colon   . Internal hemorrhoids   . Intestinal lump   . GIB (gastrointestinal bleeding) 04/11/2017  . S/P rotator cuff repair 03/01/2016    Past Surgical History:  Procedure Laterality Date  . ABDOMINAL HYSTERECTOMY    . BACK SURGERY  08/2017   CERVICAL FUSION  . BREAST BIOPSY Right 1990   LUMPECTOMY.  took lymph nodes  . COLONOSCOPY WITH PROPOFOL N/A 07/20/2017   Procedure: COLONOSCOPY WITH PROPOFOL;  Surgeon: Virgel Manifold, MD;  Location: [facility] ENDOSCOPY;  Service: Endoscopy;  Laterality: N/A;  . ENTEROSCOPY N/A 08/08/2017   Procedure: ENTEROSCOPY;  Surgeon: Lin Landsman, MD;  Location: [facility] ENDOSCOPY;  Service: Gastroenterology;  Laterality: N/A;  . ESOPHAGOGASTRODUODENOSCOPY Left 04/13/2017   Procedure: ESOPHAGOGASTRODUODENOSCOPY (EGD);  Surgeon: Virgel Manifold, MD;  Location: [facility] ENDOSCOPY;  Service: Endoscopy;  Laterality: Left;  . ESOPHAGOGASTRODUODENOSCOPY (EGD) WITH PROPOFOL N/A 07/20/2017   Procedure: ESOPHAGOGASTRODUODENOSCOPY (EGD) WITH PROPOFOL;  Surgeon: Virgel Manifold, MD;  Location: [facility] ENDOSCOPY;  Service: Endoscopy;  Laterality: N/A;  . ESOPHAGOGASTRODUODENOSCOPY (EGD)  WITH PROPOFOL N/A 12/12/2017   Procedure: ESOPHAGOGASTRODUODENOSCOPY (EGD) WITH PROPOFOL;  Surgeon: Virgel Manifold, MD;  Location: [facility] ENDOSCOPY;  Service: Endoscopy;  Laterality: N/A;  . MEDIAL PARTIAL KNEE REPLACEMENT Left 1990   torn ligament. no knee replacement at that time  . PARTIAL HYSTERECTOMY    . SHOULDER ARTHROSCOPY WITH OPEN ROTATOR CUFF REPAIR Left 03/01/2016   Procedure: SHOULDER ARTHROSCOPY WITH OPEN ROTATOR CUFF REPAIR;  Surgeon: Thornton Park, MD;  Location: [facility] ORS;  Service: Orthopedics;  Laterality: Left;  . TOTAL KNEE ARTHROPLASTY Left 02/14/2018   Procedure: TOTAL KNEE ARTHROPLASTY;  Surgeon: Thornton Park, MD;  Location: [facility] ORS;  Service: Orthopedics;  Laterality: Left;    Prior to Admission medications   Medication Sig Start Date End Date Taking? Authorizing Provider  budesonide-formoterol (SYMBICORT) 160-4.5 MCG/ACT inhaler Inhale 2 puffs into the lungs daily as needed (wheezing or shortness of breath).     [provider]  furosemide (LASIX) 20 MG tablet Take 20 mg daily by mouth.     [provider]  lisinopril (PRINIVIL,ZESTRIL) 20 MG tablet Take 20 mg by mouth daily.    [provider]  potassium chloride (MICRO-K) 10 MEQ CR capsule Take 10 mEq by mouth daily.    [provider]  propranolol (INDERAL) 60 MG tablet Take 60 mg by mouth daily.    [provider]  rOPINIRole (REQUIP) 0.5 MG tablet Take 0.5 mg by mouth at bedtime as needed (restless leg syndrome).     [provider]  simvastatin (ZOCOR) 20 MG tablet Take 20 mg by mouth daily.     [provider]  SUMAtriptan (IMITREX) 6 MG/0.5ML SOLN injection Inject 6 mg every 2 (two) hours as needed into the skin for migraine or headache. May repeat in 2 hours if headache persists or recurs.    [provider]  tiotropium (SPIRIVA) 18 MCG inhalation capsule Place 18 mcg into inhaler and inhale daily.    [provider]  vitamin B-12 1000 MCG tablet Take 1 tablet (1,000 mcg total) by mouth daily. 08/09/17   Loletha Grayer, MD    Allergies Latex  Family History  Problem Relation Age of Onset  . CAD Mother   . CAD Sister   . Breast cancer Neg Hx     Social History Social History   Tobacco Use  . Smoking status: Current Some Day Smoker    Packs/day: 0.10    Years: 50.00    Pack years: 5.00    Types: Cigarettes    Last attempt to quit: 03/13/2016    Years since  quitting: 4.1  . Smokeless tobacco: Never Used  Vaping Use  . Vaping Use: Never used  Substance Use Topics  . Alcohol use: No  . Drug use: No    Review of Systems  Review of Systems  Constitutional: Negative for chills and fever.  HENT: Negative for sore throat.   Eyes: Negative for pain.  Respiratory: Negative for cough and stridor.   Cardiovascular: Negative for chest pain.  Gastrointestinal: Negative for blood in stool, melena and vomiting.  Genitourinary: Negative for dysuria.  Musculoskeletal: Negative for myalgias.  Skin: Negative for rash.  Neurological: Positive for dizziness. Negative for seizures, loss of consciousness and headaches.  Psychiatric/Behavioral: Negative for suicidal ideas.  All other systems reviewed and are negative.     ____________________________________________   PHYSICAL EXAM:  VITAL SIGNS: ED Triage Vitals [05/06/20 0259]  Enc Vitals Group     BP (!) 98/53  Pulse Rate 98     Resp 18     Temp 98.7 F (37.1 C)     Temp Source Oral     SpO2 99 %     Weight 180 lb (81.6 kg)     Height 5\' 4"  (1.626 m)     Head Circumference      Peak Flow      Pain Score 0     Pain Loc      Pain Edu?      Excl. in [place]?    Vitals:   05/06/20 0259  BP: (!) 98/53  Pulse: 98  Resp: 18  Temp: 98.7 F (37.1 C)  SpO2: 99%   Physical Exam Vitals and nursing note reviewed. Exam conducted with a chaperone present.  Constitutional:      General: She is not in acute distress.    Appearance: She is well-developed.  HENT:     Head: Normocephalic and atraumatic.     Right Ear: External ear normal.     Left Ear: External ear normal.     Nose: Nose normal.  Eyes:     Conjunctiva/sclera: Conjunctivae normal.  Cardiovascular:     Rate and Rhythm: Normal rate and regular rhythm.     Heart sounds: No murmur heard.   Pulmonary:     Effort: Pulmonary effort is normal. No respiratory distress.     Breath sounds: Normal breath sounds.  Abdominal:      Palpations: Abdomen is soft.     Tenderness: There is no abdominal tenderness.  Genitourinary:    Rectum: Guaiac result negative. External hemorrhoid present.  Musculoskeletal:     Cervical back: Neck supple.  Skin:    General: Skin is warm and dry.     Capillary Refill: Capillary refill takes less than 2 seconds.  Neurological:     Mental Status: She is alert and oriented to person, place, and time.  Psychiatric:        Mood and Affect: Mood normal.      ____________________________________________   LABS (all labs ordered are listed, but only abnormal results are displayed)  Labs Reviewed  CBC WITH DIFFERENTIAL/PLATELET - Abnormal; Notable for the following components:      Result Value   RBC 2.37 (*)    Hemoglobin 3.8 (*)    HCT 14.2 (*)    MCV 59.9 (*)    MCH 16.0 (*)    MCHC 26.8 (*)    RDW 19.3 (*)    nRBC 0.6 (*)    All other components within normal limits  COMPREHENSIVE METABOLIC PANEL - Abnormal; Notable for the following components:   Glucose, Bld 131 (*)    Calcium 8.2 (*)    Total Protein 6.2 (*)    Alkaline Phosphatase 30 (*)    All other components within normal limits  RESP PANEL BY RT-PCR (FLU A&B, COVID) ARPGX2  URINALYSIS, COMPLETE (UACMP) WITH MICROSCOPIC  PROTIME-INR  RETICULOCYTES  TYPE AND SCREEN  PREPARE RBC (CROSSMATCH)  TROPONIN I (HIGH SENSITIVITY)   ____________________________________________  EKG  Sinus rhythm with a ventricular of 97, normal axis, unremarkable intervals, nonspecific ST and T wave changes in the inferior and anterior leads. ____________________________________________   ____________________________________________   PROCEDURES  Procedure(s) performed (including Critical Care):  .Critical Care Performed by: Lucrezia Starch, MD Authorized by: Lucrezia Starch, MD   Critical care provider statement:    Critical care time (minutes):  45   Critical care time was exclusive of:  Separately billable procedures  and treating other patients   Critical care was necessary to treat or prevent imminent or life-threatening deterioration of the following conditions:  Circulatory failure   Critical care was time spent personally by me on the following activities:  Discussions with consultants, evaluation of patient's response to treatment, examination of patient, ordering and performing treatments and interventions, ordering and review of laboratory studies, ordering and review of radiographic studies, pulse oximetry, re-evaluation of patient's condition, obtaining history from patient or surrogate and review of old charts     ____________________________________________   INITIAL IMPRESSION / [place] / ED COURSE      Patient presents with [place] to history exam for assessment of several weeks of progressively worsening dizziness and lightheadedness.  On arrival patient is borderline hypotensive with a BP of 98/53 with otherwise stable vital signs on room air.  Exam as above remarkable for abdomen is soft and nontender throughout with evidence of external hemorrhoid on exam but no active bleeding and Hemoccult negative rectal exam.  CMP remarkable for no significant electrolyte or metabolic derangements.  Specifically T bili is unremarkable and not consistent with hemolysis.  CBC shows evidence of acute significant anemia with hemoglobin of 3.8 compared to 7.82 years ago.  Patient's white blood cell count is normal and her platelets are also within normal limits.  Troponin is unremarkable.  Low suspicion for ACS at this time.  No evidence of significant arrhythmia.  Patient did consent for blood transfusion and 2 units were ordered while she was in the emergency room.  Unclear etiology at this time given negative Hemoccult exam although will plan to admit to medicine for further evaluation management.   ____________________________________________   FINAL CLINICAL IMPRESSION(S) / ED  DIAGNOSES  Final diagnoses:  Symptomatic anemia    Medications  0.9 %  sodium chloride infusion (Manually program via Guardrails IV Fluids) (has no administration in time range)     ED Discharge Orders    None       Note:  This document was prepared using Dragon voice recognition software and may include unintentional dictation errors.   Lucrezia Starch, MD 05/06/20 [phone]

## 2020-05-06 NOTE — ED Notes (Signed)
Third unit of blood complete.  Pt tolerated well with no s/sx of reaction.

## 2020-05-06 NOTE — ED Notes (Signed)
Second unit of blood complete without incident.  Pt tolerated well.  Pt denies any s/sx of reactions.

## 2020-05-06 NOTE — ED Notes (Signed)
Acuity level changed; lab reports Hbg 3.8 and Hct 14.2

## 2020-05-06 NOTE — Consult Note (Signed)
Hailey Antigua, MD [address] [place], [place], [place], [address] [address], [place], [place], [place], [address] Phone: [phone]  Fax: [phone]  Consultation  Referring Provider:     Dr. Sidney Ace Primary Care Physician:  Letta Median, MD Reason for Consultation:     Anemia  Date of Admission:  05/06/2020 Date of Consultation:  05/06/2020         HPI:   Hailey Farrell is a 69 y.o. female presents with severe microcytic anemia with no evidence of active bleeding recently.  Patient denies any NSAID use.  No melena, hematochezia, hematemesis.  No abdominal pain.  Does report some fatigue over the last 3 weeks.  Patient has previous history of anemia in 2018 and 2019.    In 2018 patient was on NSAIDs, meloxicam at home and was admitted with hemoglobin around 6.  She underwent EGD on November 9 which showed 2-3 clean base gastric ulcers, 3 mm in greatest dimension, 1 to millimeter duodenal bulb ulcer, gastric biopsy showed H. pylori and patient was treated with triple therapy.  Patient underwent repeat upper endoscopy in February 2019 which showed 1-2 nonbleeding duodenal ulcers, with no stigmata of bleeding.  Previously seen stomach ulcers had resolved.  Biopsies were negative for H. pylori.  Colonoscopy in February 2019 with 2 subcentimeter polyps removed, thickened fold noted in the cecum, and internal hemorrhoids noted.  Colon polyp showed tubular adenoma and hyperplastic polyp, thickened fold showed focal minimal active colitis.  Patient underwent small bowel enteroscopy in March 2019 due to severe anemia with hemoglobin 3.7.  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  Few angiectasia's were noted in the duodenal bulb and the second portion that were treated with coagulation also.  5 mm angiectasia found in the stomach that was also treated with coagulation.  The procedure was done by Dr. Marius Ditch  Repeat upper endoscopy in July 2019  for gastric mapping biopsies due to intestinal metaplasia previously seen, showed gastric erythema, gastric erosions, small hiatal hernia, mucosal changes in the duodenum, and was otherwise normal.  Pathology showed H. pylori gastritis and intestinal metaplasia present in the antral and incisura biopsies.  Patient was prescribed levofloxacin therapy.  Patient did not follow-up in clinic since then  Past Medical History:  Diagnosis Date  . Anemia   . Arthritis   . Breast cancer ([place]) 1990   Right  . COPD (chronic obstructive pulmonary disease) ([place])   . Dyspnea    DOE  . Elevated lipids   . Hypertension   . Lower extremity edema   . Migraines   . Migraines   . Personal history of radiation therapy 1990   Breast  . Restless leg syndrome   . Rotator cuff injury     Past Surgical History:  Procedure Laterality Date  . ABDOMINAL HYSTERECTOMY    . BACK SURGERY  08/2017   CERVICAL FUSION  . BREAST BIOPSY Right 1990   LUMPECTOMY.  took lymph nodes  . COLONOSCOPY WITH PROPOFOL N/A 07/20/2017   Procedure: COLONOSCOPY WITH PROPOFOL;  Surgeon: Virgel Manifold, MD;  Location: [facility] ENDOSCOPY;  Service: Endoscopy;  Laterality: N/A;  . ENTEROSCOPY N/A 08/08/2017   Procedure: ENTEROSCOPY;  Surgeon: Lin Landsman, MD;  Location: [facility] ENDOSCOPY;  Service: Gastroenterology;  Laterality: N/A;  . ESOPHAGOGASTRODUODENOSCOPY Left 04/13/2017   Procedure: ESOPHAGOGASTRODUODENOSCOPY (EGD);  Surgeon: Virgel Manifold, MD;  Location: [facility] ENDOSCOPY;  Service: Endoscopy;  Laterality: Left;  .  ESOPHAGOGASTRODUODENOSCOPY (EGD) WITH PROPOFOL N/A 07/20/2017   Procedure: ESOPHAGOGASTRODUODENOSCOPY (EGD) WITH PROPOFOL;  Surgeon: Virgel Manifold, MD;  Location: [facility] ENDOSCOPY;  Service: Endoscopy;  Laterality: N/A;  . ESOPHAGOGASTRODUODENOSCOPY (EGD) WITH PROPOFOL N/A 12/12/2017   Procedure: ESOPHAGOGASTRODUODENOSCOPY (EGD) WITH PROPOFOL;  Surgeon: Virgel Manifold, MD;  Location: [facility] ENDOSCOPY;   Service: Endoscopy;  Laterality: N/A;  . MEDIAL PARTIAL KNEE REPLACEMENT Left 1990   torn ligament. no knee replacement at that time  . PARTIAL HYSTERECTOMY    . SHOULDER ARTHROSCOPY WITH OPEN ROTATOR CUFF REPAIR Left 03/01/2016   Procedure: SHOULDER ARTHROSCOPY WITH OPEN ROTATOR CUFF REPAIR;  Surgeon: Thornton Park, MD;  Location: [facility] ORS;  Service: Orthopedics;  Laterality: Left;  . TOTAL KNEE ARTHROPLASTY Left 02/14/2018   Procedure: TOTAL KNEE ARTHROPLASTY;  Surgeon: Thornton Park, MD;  Location: [facility] ORS;  Service: Orthopedics;  Laterality: Left;    Prior to Admission medications   Medication Sig Start Date End Date Taking? Authorizing Provider  albuterol (VENTOLIN HFA) 108 (90 Base) MCG/ACT inhaler Inhale 2 puffs into the lungs every 6 (six) hours as needed for wheezing.   Yes [provider]  amitriptyline (ELAVIL) 25 MG tablet Take 25 mg by mouth at bedtime. 04/13/20  Yes [provider]  budesonide-formoterol (SYMBICORT) 160-4.5 MCG/ACT inhaler Inhale 2 puffs into the lungs daily as needed (wheezing or shortness of breath).    Yes [provider]  diclofenac Sodium (VOLTAREN) 1 % GEL Apply 2 g topically daily as needed.   Yes [provider]  furosemide (LASIX) 20 MG tablet Take 20 mg daily by mouth.    Yes [provider]  lisinopril (PRINIVIL,ZESTRIL) 20 MG tablet Take 20 mg by mouth daily.   Yes [provider]  potassium chloride (MICRO-K) 10 MEQ CR capsule Take 10 mEq by mouth daily.   Yes [provider]  rOPINIRole (REQUIP) 0.5 MG tablet Take 0.5 mg by mouth at bedtime as needed (restless leg syndrome).    Yes [provider]  simvastatin (ZOCOR) 20 MG tablet Take 20 mg by mouth daily.    Yes [provider]  SUMAtriptan (IMITREX) 6 MG/0.5ML SOLN injection Inject 6 mg every 2 (two) hours as needed into the skin for migraine or headache. May repeat in 2 hours if headache persists or recurs.   Yes  [provider]  tiotropium (SPIRIVA) 18 MCG inhalation capsule Place 18 mcg into inhaler and inhale daily.   Yes [provider]  vitamin B-12 1000 MCG tablet Take 1 tablet (1,000 mcg total) by mouth daily. 08/09/17  Yes Loletha Grayer, MD    Family History  Problem Relation Age of Onset  . CAD Mother   . CAD Sister   . Breast cancer Neg Hx      Social History   Tobacco Use  . Smoking status: Current Some Day Smoker    Packs/day: 0.10    Years: 50.00    Pack years: 5.00    Types: Cigarettes    Last attempt to quit: 03/13/2016    Years since quitting: 4.1  . Smokeless tobacco: Never Used  Vaping Use  . Vaping Use: Never used  Substance Use Topics  . Alcohol use: No  . Drug use: No    Allergies as of 05/06/2020 - Review Complete 05/06/2020  Allergen Reaction Noted  . Latex Rash 02/03/2015    Review of Systems:    All systems reviewed and negative except where noted in HPI.   Physical Exam:  Vital signs in last 24 hours: Vitals:   05/06/20 1200 05/06/20 1202 05/06/20 1215 05/06/20 1300  BP: 127/73 122/66 140/74 (!) 144/85  Pulse: 87  90 97  Resp: 18  18 18   Temp: 98.6 F (37 C)  98.1 F (36.7 C) 99.1 F (37.3 C)  TempSrc: Oral  Oral Oral  SpO2: 99%  98% 99%  Weight:      Height:         General:   Pleasant, cooperative in NAD Head:  Normocephalic and atraumatic. Eyes:   No icterus.   Conjunctiva pink. PERRLA. Ears:  Normal auditory acuity. Neck:  Supple; no masses or thyroidomegaly Lungs: Respirations even and unlabored. Lungs clear to auscultation bilaterally.   No wheezes, crackles, or rhonchi.  Abdomen:  Soft, nondistended, nontender. Normal bowel sounds. No appreciable masses or hepatomegaly.  No rebound or guarding.  Neurologic:  Alert and oriented x3;  grossly normal neurologically. Skin:  Intact without significant lesions or rashes. Cervical Nodes:  No significant cervical adenopathy. Psych:  Alert and cooperative. Normal  affect.  LAB RESULTS: Recent Labs    05/06/20 0309 05/06/20 0537  WBC 4.8 5.1  HGB 3.8* 3.7*  HCT 14.2* 13.7*  PLT 397 371   BMET Recent Labs    05/06/20 0309 05/06/20 0537  NA 136 138  K 4.0 4.3  CL 106 106  CO2 23 23  GLUCOSE 131* 108*  BUN 10 10  CREATININE 0.98 0.98  CALCIUM 8.2* 8.4*   LFT Recent Labs    05/06/20 0309  PROT 6.2*  ALBUMIN 3.7  AST 19  ALT 14  ALKPHOS 30*  BILITOT 0.3   PT/INR Recent Labs    05/06/20 0310  LABPROT 13.3  INR 1.1    STUDIES: No results found.    Impression / Plan:   LATISA BELAY is a 69 y.o. y/o female with severe iron deficiency anemia with no signs of active GI bleeding, with previous history of gastric ulcers from NSAID use which had subsequently resolved, H. 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  Would recommend PRBC replacement and proceed with upper endoscopy/push enteroscopy most likely tomorrow morning.  However, if clinical status changes or patient has any signs of active GI bleeding she may need the procedure earlier than that  PPI IV twice daily  Continue serial CBCs and transfuse PRN Avoid NSAIDs Maintain 2 large-bore IV lines Please page GI with any acute hemodynamic changes, or signs of active GI bleeding  Given that patient has had a colonoscopy in the last 2  years, would not recommend repeat colonoscopy at this time unless upper endoscopy/push enteroscopy is negative  I discussed the case with patient's primary attending, Dr. Reesa Chew and patient has several units of blood ordered and still has not completed these transfusions, therefore repeat check is not planned until later this evening, and therefore  it would be best to proceed with upper endoscopy tomorrow morning after above resuscitation measures  Consider hematology consult  Avoid NSAIDs.  Patient denies any NSAID use since hospitalization in 2018  Thank you for involving me in the care of this patient.      LOS: 0 days   Virgel Manifold, MD  05/06/2020, 1:25 PM

## 2020-05-06 NOTE — ED Notes (Signed)
First 15 minutes of transfusion complete.  Pt has no complaints or signs of symptoms of reaction. Transfusion increased to 200/hr.

## 2020-05-06 NOTE — ED Notes (Signed)
Third unit of blood initiated at 120 ml/hr.

## 2020-05-06 NOTE — ED Triage Notes (Signed)
Pt to triage via w/c with no distress noted; pt went to PCP Wed to eval of dizziness & weakness; st she was called PTA and told to come to ED for low H&H; st hx of same with blood tx in past; denies any recent S&S bleeding

## 2020-05-06 NOTE — H&P (Signed)
[place]   PATIENT NAME: Hailey Farrell    MR#:  [id number]  DATE OF BIRTH:  1950/08/26  DATE OF ADMISSION:  05/06/2020  PRIMARY CARE PHYSICIAN: Letta Median, MD   REQUESTING/REFERRING PHYSICIAN: Hulan Saas, MD  CHIEF COMPLAINT:   Chief Complaint  Patient presents with  . Abnormal Lab    HISTORY OF PRESENT ILLNESS:  Kaleya Douse  is a 69 y.o. African-American female with a known history of COPD, hypertension, migraine, restless leg syndrome and chronic iron deficiency anemia getting iron transfusions and requiring blood transfusions, with history of focal intestinal metaplasia and H. pylori, who presented to the emergency room with acute onset of dizziness and lightheadedness with presyncope and severe anemia on outpatient lab ordered by her primary care physician.  She admitted to recent fatigue and tiredness as well as dyspnea on exertion.  She denies any nausea or vomiting or diarrhea.  No bleeding diathesis.  No melena or bright red bleeding per rectum.  No chest pain or palpitations.  No cough or wheezing or hemoptysis.  She has been having leg cramps in the ER.  Upon presentation to the emergency room, blood pressure was 98/53 with otherwise normal vital signs.  Labs revealed a total protein 6.2 with otherwise unremarkable CMP.  Her hemoglobin was 3.8 and hematocrit 14.2 compared to 7.8 and 23.6 on 02/17/2018.  Her stool Hemoccult revealed no stools in the vault and only little mucus that was heme-negative.  The patient was typed and crossmatched and will be transfused 3 units of packed red blood cells.  She'll be admitted to a progressive unit bed for further evaluation and management.  PAST MEDICAL HISTORY:   Past Medical History:  Diagnosis Date  . Anemia   . Arthritis   . Breast cancer ([place]) 1990   Right  . COPD (chronic obstructive pulmonary disease) ([place])   . Dyspnea    DOE  . Elevated lipids   . Hypertension   . Lower extremity edema   .  Migraines   . Migraines   . Personal history of radiation therapy 1990   Breast  . Restless leg syndrome   . Rotator cuff injury     PAST SURGICAL HISTORY:   Past Surgical History:  Procedure Laterality Date  . ABDOMINAL HYSTERECTOMY    . BACK SURGERY  08/2017   CERVICAL FUSION  . BREAST BIOPSY Right 1990   LUMPECTOMY.  took lymph nodes  . COLONOSCOPY WITH PROPOFOL N/A 07/20/2017   Procedure: COLONOSCOPY WITH PROPOFOL;  Surgeon: Virgel Manifold, MD;  Location: [facility] ENDOSCOPY;  Service: Endoscopy;  Laterality: N/A;  . ENTEROSCOPY N/A 08/08/2017   Procedure: ENTEROSCOPY;  Surgeon: Lin Landsman, MD;  Location: [facility] ENDOSCOPY;  Service: Gastroenterology;  Laterality: N/A;  . ESOPHAGOGASTRODUODENOSCOPY Left 04/13/2017   Procedure: ESOPHAGOGASTRODUODENOSCOPY (EGD);  Surgeon: Virgel Manifold, MD;  Location: [facility] ENDOSCOPY;  Service: Endoscopy;  Laterality: Left;  . ESOPHAGOGASTRODUODENOSCOPY (EGD) WITH PROPOFOL N/A 07/20/2017   Procedure: ESOPHAGOGASTRODUODENOSCOPY (EGD) WITH PROPOFOL;  Surgeon: Virgel Manifold, MD;  Location: [facility] ENDOSCOPY;  Service: Endoscopy;  Laterality: N/A;  . ESOPHAGOGASTRODUODENOSCOPY (EGD) WITH PROPOFOL N/A 12/12/2017   Procedure: ESOPHAGOGASTRODUODENOSCOPY (EGD) WITH PROPOFOL;  Surgeon: Virgel Manifold, MD;  Location: [facility] ENDOSCOPY;  Service: Endoscopy;  Laterality: N/A;  . MEDIAL PARTIAL KNEE REPLACEMENT Left 1990   torn ligament. no knee replacement at that time  . PARTIAL HYSTERECTOMY    . SHOULDER ARTHROSCOPY WITH OPEN ROTATOR CUFF REPAIR Left 03/01/2016  Procedure: SHOULDER ARTHROSCOPY WITH OPEN ROTATOR CUFF REPAIR;  Surgeon: Thornton Park, MD;  Location: [facility] ORS;  Service: Orthopedics;  Laterality: Left;  . TOTAL KNEE ARTHROPLASTY Left 02/14/2018   Procedure: TOTAL KNEE ARTHROPLASTY;  Surgeon: Thornton Park, MD;  Location: [facility] ORS;  Service: Orthopedics;  Laterality: Left;    SOCIAL HISTORY:   Social History   Tobacco Use   . Smoking status: Current Some Day Smoker    Packs/day: 0.10    Years: 50.00    Pack years: 5.00    Types: Cigarettes    Last attempt to quit: 03/13/2016    Years since quitting: 4.1  . Smokeless tobacco: Never Used  Substance Use Topics  . Alcohol use: No    FAMILY HISTORY:   Family History  Problem Relation Age of Onset  . CAD Mother   . CAD Sister   . Breast cancer Neg Hx     DRUG ALLERGIES:   Allergies  Allergen Reactions  . Latex Rash    Welts over body    REVIEW OF SYSTEMS:   ROS As per history of present illness. All pertinent systems were reviewed above. Constitutional, HEENT, cardiovascular, respiratory, GI, GU, musculoskeletal, neuro, psychiatric, endocrine, integumentary and hematologic systems were reviewed and are otherwise negative/unremarkable except for positive findings mentioned above in the HPI.   MEDICATIONS AT HOME:   Prior to Admission medications   Medication Sig Start Date End Date Taking? Authorizing Provider  budesonide-formoterol (SYMBICORT) 160-4.5 MCG/ACT inhaler Inhale 2 puffs into the lungs daily as needed (wheezing or shortness of breath).     [provider]  furosemide (LASIX) 20 MG tablet Take 20 mg daily by mouth.     [provider]  lisinopril (PRINIVIL,ZESTRIL) 20 MG tablet Take 20 mg by mouth daily.    [provider]  potassium chloride (MICRO-K) 10 MEQ CR capsule Take 10 mEq by mouth daily.    [provider]  propranolol (INDERAL) 60 MG tablet Take 60 mg by mouth daily.    [provider]  rOPINIRole (REQUIP) 0.5 MG tablet Take 0.5 mg by mouth at bedtime as needed (restless leg syndrome).     [provider]  simvastatin (ZOCOR) 20 MG tablet Take 20 mg by mouth daily.     [provider]  SUMAtriptan (IMITREX) 6 MG/0.5ML SOLN injection Inject 6 mg every 2 (two) hours as needed into the skin for migraine or headache. May repeat in 2 hours if headache persists or  recurs.    [provider]  tiotropium (SPIRIVA) 18 MCG inhalation capsule Place 18 mcg into inhaler and inhale daily.    [provider]  vitamin B-12 1000 MCG tablet Take 1 tablet (1,000 mcg total) by mouth daily. 08/09/17   Loletha Grayer, MD      VITAL SIGNS:  Blood pressure (!) 98/53, pulse 98, temperature 98.7 F (37.1 C), temperature source Oral, resp. rate 18, height 5\' 4"  (1.626 m), weight 81.6 kg, SpO2 99 %.  PHYSICAL EXAMINATION:  Physical Exam  GENERAL:  69 y.o.-year-old African-American female patient lying in the bed with mild distress from leg cramps.  She has mild conversational dyspnea as well. EYES: Pupils equal, round, reactive to light and accommodation.  Positive pallor.  No scleral icterus. Extraocular muscles intact.  HEENT: Head atraumatic, normocephalic. Oropharynx and nasopharynx clear.  NECK:  Supple, no jugular venous distention. No thyroid enlargement, no tenderness.  LUNGS: Normal breath sounds bilaterally, no wheezing, rales,rhonchi or crepitation. No use of  accessory muscles of respiration.  CARDIOVASCULAR: Regular rate and rhythm, S1, S2 normal. No murmurs, rubs, or gallops.  ABDOMEN: Soft, nondistended, nontender. Bowel sounds present. No organomegaly or mass.  EXTREMITIES: No pedal edema, cyanosis, or clubbing.  NEUROLOGIC: Cranial nerves II through XII are intact. Muscle strength 5/5 in all extremities. Sensation intact. Gait not checked.  PSYCHIATRIC: The patient is alert and oriented x 3.  Normal affect and good eye contact. SKIN: No obvious rash, lesion, or ulcer.   LABORATORY PANEL:   CBC Recent Labs  Lab 05/06/20 0309  WBC 4.8  HGB 3.8*  HCT 14.2*  PLT 397   ------------------------------------------------------------------------------------------------------------------  Chemistries  Recent Labs  Lab 05/06/20 0309  NA 136  K 4.0  CL 106  CO2 23  GLUCOSE 131*  BUN 10  CREATININE 0.98  CALCIUM 8.2*  AST 19   ALT 14  ALKPHOS 30*  BILITOT 0.3   ------------------------------------------------------------------------------------------------------------------  Cardiac Enzymes No results for input(s): TROPONINI in the last 168 hours. ------------------------------------------------------------------------------------------------------------------  RADIOLOGY:  No results found.    IMPRESSION AND PLAN:   1.  Severe symptomatic anemia. -The patient will be admitted to medical monitored bed. -She was typed and crossmatch and will be transfused 3 units of packed red blood cells. -We'll follow posttransfusion H&H. -Though she was heme-negative but given her hemorrhoids I believe that she will need GI evaluation. -I notified Dr. Alice Reichert about the patient. -Given the chronicity of her anemia and previous history of iron transfusion, hematology consultation will be obtained. -I notified Dr. Tasia Catchings about the patient. Burnis Medin place her on IV Protonix for now.  2.  Essential hypertension. -We'll continue Zestril.  3.  COPD. -We'll continue her Symbicort and Spiriva and place her on as needed duo nebs.  4. Restless leg syndrome with leg cramps. -We'll continue Requip and place her on as needed Flexeril.  5.   Dyslipidemia. -We'll continue statin therapy.  6.  Migraine headaches. -We'll continue as needed Imitrex.  7.  DVT prophylaxis. -SCDs. -We are avoiding medical prophylaxis at this time pending GI evaluation for the possibility of blood loss anemia..  All the records are reviewed and case discussed with ED provider. The plan of care was discussed in details with the patient (and family). I answered all questions. The patient agreed to proceed with the above mentioned plan. Further management will depend upon hospital course.   CODE STATUS: Discussed with her and she wants to be full code  Status is: Inpatient  Remains inpatient appropriate because:Ongoing diagnostic testing needed  not appropriate for outpatient work up, Unsafe d/c plan, IV treatments appropriate due to intensity of illness or inability to take PO and Inpatient level of care appropriate due to severity of illness   Dispo: The patient is from: Home              Anticipated d/c is to: Home              Anticipated d/c date is: 2 days              Patient currently is not medically stable to d/c.   TOTAL TIME TAKING CARE OF THIS PATIENT: 55 minutes.    Christel Mormon M.D on 05/06/2020 at 4:19 AM  [facility]   From 7 PM-7 AM, contact night-coverage [email or web address]  CC: Primary care physician; Letta Median, MD

## 2020-05-07 ENCOUNTER — Inpatient Hospital Stay: Payer: Medicare HMO | Admitting: Anesthesiology

## 2020-05-07 ENCOUNTER — Encounter: Payer: Self-pay | Admitting: Internal Medicine

## 2020-05-07 ENCOUNTER — Encounter
Admission: EM | Disposition: A | Discharge status: Home / Self Care | Payer: Self-pay | Source: Ambulatory Visit | Attending: Internal Medicine

## 2020-05-07 ENCOUNTER — Inpatient Hospital Stay: Payer: Medicare HMO

## 2020-05-07 DIAGNOSIS — D649 Anemia, unspecified: Secondary | ICD-10-CM | POA: Diagnosis not present

## 2020-05-07 DIAGNOSIS — D500 Iron deficiency anemia secondary to blood loss (chronic): Secondary | ICD-10-CM | POA: Diagnosis not present

## 2020-05-07 DIAGNOSIS — D509 Iron deficiency anemia, unspecified: Secondary | ICD-10-CM | POA: Diagnosis not present

## 2020-05-07 DIAGNOSIS — K5521 Angiodysplasia of colon with hemorrhage: Secondary | ICD-10-CM | POA: Diagnosis not present

## 2020-05-07 DIAGNOSIS — Q2730 Arteriovenous malformation, site unspecified: Secondary | ICD-10-CM

## 2020-05-07 HISTORY — PX: ENTEROSCOPY: SHX5533

## 2020-05-07 LAB — TYPE AND SCREEN
ABO/RH(D): O POS
Antibody Screen: NEGATIVE
Unit division: 0
Unit division: 0
Unit division: 0

## 2020-05-07 LAB — BPAM RBC
Blood Product Expiration Date: 202201012359
Blood Product Expiration Date: 202201012359
Blood Product Expiration Date: 202201032359
ISSUE DATE / TIME: 202112020816
ISSUE DATE / TIME: 202112021139
ISSUE DATE / TIME: 202112021355
Unit Type and Rh: 5100
Unit Type and Rh: 5100
Unit Type and Rh: 5100

## 2020-05-07 LAB — CBC
HCT: 25.6 % — ABNORMAL LOW (ref 36.0–46.0)
Hemoglobin: 7.8 g/dL — ABNORMAL LOW (ref 12.0–15.0)
MCH: 21.4 pg — ABNORMAL LOW (ref 26.0–34.0)
MCHC: 30.5 g/dL (ref 30.0–36.0)
MCV: 70.1 fL — ABNORMAL LOW (ref 80.0–100.0)
Platelets: 332 10*3/uL (ref 150–400)
RBC: 3.65 MIL/uL — ABNORMAL LOW (ref 3.87–5.11)
RDW: 28.1 % — ABNORMAL HIGH (ref 11.5–15.5)
WBC: 5.9 10*3/uL (ref 4.0–10.5)
nRBC: 0.9 % — ABNORMAL HIGH (ref 0.0–0.2)

## 2020-05-07 SURGERY — ENTEROSCOPY
Anesthesia: General

## 2020-05-07 MED ORDER — LIDOCAINE HCL (CARDIAC) PF 100 MG/5ML IV SOSY
PREFILLED_SYRINGE | INTRAVENOUS | Status: DC | PRN
  Administered 2020-05-07: 100 mg via INTRAVENOUS

## 2020-05-07 MED ORDER — ONDANSETRON HCL 4 MG/2ML IJ SOLN
INTRAMUSCULAR | Status: AC
  Filled 2020-05-07: qty 2

## 2020-05-07 MED ORDER — ENSURE ENLIVE PO LIQD
237.0000 mL | Freq: Two times a day (BID) | ORAL | Status: DC
  Administered 2020-05-07: 17:00:00 237 mL via ORAL

## 2020-05-07 MED ORDER — PROPOFOL 10 MG/ML IV BOLUS
INTRAVENOUS | Status: DC | PRN
  Administered 2020-05-07: 60 mg via INTRAVENOUS

## 2020-05-07 MED ORDER — AMITRIPTYLINE HCL 25 MG PO TABS: 25.0000 mg | ORAL_TABLET | Freq: Every day | ORAL | Status: DC

## 2020-05-07 MED ORDER — ONDANSETRON HCL 4 MG/2ML IJ SOLN
4.0000 mg | Freq: Once | INTRAMUSCULAR | Status: AC
  Administered 2020-05-07: 4 mg via INTRAVENOUS

## 2020-05-07 MED ORDER — PROPOFOL 500 MG/50ML IV EMUL
INTRAVENOUS | Status: DC | PRN
  Administered 2020-05-07: 100 ug/kg/min via INTRAVENOUS

## 2020-05-07 MED ORDER — SODIUM CHLORIDE 0.9 % IV SOLN: INTRAVENOUS | Status: DC

## 2020-05-07 NOTE — Progress Notes (Signed)
PROGRESS NOTE    Hailey Farrell  HUT:[id number] DOB: [birth year] DOA: 05/06/2020 PCP: Letta Median, MD   Brief Narrative: Taken from H&P 10 a69 y.o.African-American femalewith a known history ofCOPD, hypertension, migraine, restless leg syndrome and chronic iron deficiency anemia getting iron transfusions and requiring blood transfusions, with history of focal intestinal metaplasia and H. pylori, whopresented to the emergency room with acute onset of dizziness and lightheadednesswith presyncopeand severe anemia on outpatient lab ordered by her primary care physician. She admitted to recent fatigue and tiredness as well as dyspnea on exertion. She denies any nausea or vomiting or diarrhea. No bleeding diathesis. No melena or bright red bleeding per rectum. No chest pain or palpitations. No cough or wheezing orhemoptysis.  Bedside stool occult card was negative.  Found to have hemoglobin of 3.7 and received 3 units of PRBC.  Iron studies with severe iron deficiency.  Hematology and GI was consulted.  Concern of slow GI bleeding with angiodysplasias.  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  Patient underwent repeat EGD today and found to have multiple angiodysplasias which were coagulated again.  No active bleeding.  Subjective: Patient denies any complaints when seen today.  She was waiting for her EGD.  Husband at bedside.  She wants to go home after the procedure.  Assessment & Plan:   Active Problems:   Symptomatic anemia   AVM (arteriovenous malformation) of small bowel, acquired with hemorrhage  Symptomatic severe iron deficiency anemia.  Most likely slow GI bleeding as patient has multiple AVMs.  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  Hemoglobin improved to 7.8 this morning. She also received 1  bag of IV iron today, total of 3 units of PRBC. -GI wants to keep her for another night and repeat hemoglobin tomorrow if remains stable she can follow-up with them as an outpatient for capsule endoscopy and colonoscopy. -Continue to monitor -Transfuse if below 7  Hypertension.  Blood pressure within goal. -Continue with home Zestril.  COPD.  No acute exacerbation. -Continue with home Symbicort and Spiriva. -As needed bronchodilator.  Restless leg syndrome with leg cramps. -Continue with Requip.  Dyslipidemia. -Continue with statin.  History of migraines. -Continue as needed Imitrex  Objective: Vitals:   05/07/20 1240 05/07/20 1250 05/07/20 1300 05/07/20 1458  BP: 92/61 111/70 112/60 132/78  Pulse: 67 67 65 69  Resp: (!) 23 (!) 25 (!) 24 18  Temp: (!) 97 F (36.1 C)   98.4 F (36.9 C)  TempSrc:    Oral  SpO2: 91% 95% 95% 94%  Weight:      Height:        Intake/Output Summary (Last 24 hours) at 05/07/2020 1552 Last data filed at 05/06/2020 2030 Gross per 24 hour  Intake 240 ml  Output --  Net 240 ml   Filed Weights   05/06/20 0259  Weight: 81.6 kg    Examination:  General exam: Appears calm and comfortable  Respiratory system: Clear to auscultation. Respiratory effort normal. Cardiovascular system: S1 & S2 heard, RRR. No JVD, murmurs, rubs, gallops or clicks. Gastrointestinal system: Soft, nontender, nondistended, bowel sounds positive. Central nervous system: Alert and oriented. No focal neurological deficits.Symmetric 5 x 5 power. Extremities: No edema, no cyanosis, pulses intact and symmetrical. Skin: No rashes, lesions or ulcers Psychiatry: Judgement and insight appear normal. Mood & affect appropriate.    DVT prophylaxis: SCDs  Code Status: Full Family Communication: Husband was updated at bedside Disposition Plan:  Status is: Inpatient  Remains inpatient appropriate because:Inpatient level of care appropriate due to severity of illness   Dispo:  The patient is from: Home              Anticipated d/c is to: Home              Anticipated d/c date is: 1 day              Patient currently is not medically stable to d/c.   Consultants:   GI  Procedures:  EGD  Antimicrobials:   Data Reviewed: I have personally reviewed following labs and imaging studies  CBC: Recent Labs  Lab 05/06/20 0309 05/06/20 0537 05/06/20 1737 05/07/20 0753  WBC 4.8 5.1 5.7 5.9  NEUTROABS 2.7  --   --   --   HGB 3.8* 3.7* 8.1* 7.8*  HCT 14.2* 13.7* 26.2* 25.6*  MCV 59.9* 59.6* 69.5* 70.1*  PLT 397 371 332 295   Basic Metabolic Panel: Recent Labs  Lab 05/06/20 0309 05/06/20 0537  NA 136 138  K 4.0 4.3  CL 106 106  CO2 23 23  GLUCOSE 131* 108*  BUN 10 10  CREATININE 0.98 0.98  CALCIUM 8.2* 8.4*   GFR: Estimated Creatinine Clearance: 55.2 mL/min (by C-G formula based on SCr of 0.98 mg/dL). Liver Function Tests: Recent Labs  Lab 05/06/20 0309  AST 19  ALT 14  ALKPHOS 30*  BILITOT 0.3  PROT 6.2*  ALBUMIN 3.7   No results for input(s): LIPASE, AMYLASE in the last 168 hours. No results for input(s): AMMONIA in the last 168 hours. Coagulation Profile: Recent Labs  Lab 05/06/20 0310  INR 1.1   Cardiac Enzymes: No results for input(s): CKTOTAL, CKMB, CKMBINDEX, TROPONINI in the last 168 hours. BNP (last 3 results) No results for input(s): PROBNP in the last 8760 hours. HbA1C: No results for input(s): HGBA1C in the last 72 hours. CBG: No results for input(s): GLUCAP in the last 168 hours. Lipid Profile: No results for input(s): CHOL, HDL, LDLCALC, TRIG, CHOLHDL, LDLDIRECT in the last 72 hours. Thyroid Function Tests: No results for input(s): TSH, T4TOTAL, FREET4, T3FREE, THYROIDAB in the last 72 hours. Anemia Panel: Recent Labs    05/06/20 0309 05/06/20 0537  FERRITIN  --  2*  TIBC  --  493*  IRON  --  12*  RETICCTPCT 1.3  --    Sepsis Labs: No results for input(s): PROCALCITON, LATICACIDVEN in the last 168  hours.  Recent Results (from the past 240 hour(s))  Resp Panel by RT-PCR (Flu A&B, Covid) Nasopharyngeal Swab     Status: None   Collection Time: 05/06/20  5:37 AM   Specimen: Nasopharyngeal Swab; Nasopharyngeal(NP) swabs in vial transport medium  Result Value Ref Range Status   SARS Coronavirus 2 by RT PCR NEGATIVE NEGATIVE Final    Comment: (NOTE) SARS-CoV-2 target nucleic acids are NOT DETECTED.  The SARS-CoV-2 RNA is generally detectable in upper respiratory specimens during the acute phase of infection. The lowest concentration of SARS-CoV-2 viral copies this assay can detect is 138 copies/mL. A negative result does not preclude SARS-Cov-2 infection and should not be used as the sole basis for treatment or other patient management decisions. A negative result may occur with  improper specimen collection/handling, submission of specimen other than nasopharyngeal swab, presence of viral mutation(s) within the areas targeted by this assay, and inadequate number of viral copies(<138  copies/mL). A negative result must be combined with clinical observations, patient history, and epidemiological information. The expected result is Negative.  Fact Sheet for Patients:  EntrepreneurPulse.com.au  Fact Sheet for Healthcare Providers:  IncredibleEmployment.be  This test is no t yet approved or cleared by the [place] FDA and  has been authorized for detection and/or diagnosis of SARS-CoV-2 by FDA under an Emergency Use Authorization (EUA). This EUA will remain  in effect (meaning this test can be used) for the duration of the COVID-19 declaration under Section 564(b)(1) of the Act, 21 U.S.C.section 360bbb-3(b)(1), unless the authorization is terminated  or revoked sooner.       Influenza A by PCR NEGATIVE NEGATIVE Final   Influenza B by PCR NEGATIVE NEGATIVE Final    Comment: (NOTE) The Xpert Xpress SARS-CoV-2/FLU/RSV plus assay is intended  as an aid in the diagnosis of influenza from Nasopharyngeal swab specimens and should not be used as a sole basis for treatment. Nasal washings and aspirates are unacceptable for Xpert Xpress SARS-CoV-2/FLU/RSV testing.  Fact Sheet for Patients: EntrepreneurPulse.com.au  Fact Sheet for Healthcare Providers: IncredibleEmployment.be  This test is not yet approved or cleared by the [place] FDA and has been authorized for detection and/or diagnosis of SARS-CoV-2 by FDA under an Emergency Use Authorization (EUA). This EUA will remain in effect (meaning this test can be used) for the duration of the COVID-19 declaration under Section 564(b)(1) of the Act, 21 U.S.C. section 360bbb-3(b)(1), unless the authorization is terminated or revoked.  Performed at [facility], [address] [place], [place] [address]      Radiology Studies: DG Abd 1 View - KUB  Result Date: 05/07/2020 CLINICAL DATA:  Abdominal pain after endoscopy EXAM: ABDOMEN - 1 VIEW COMPARISON:  None. FINDINGS: The lung bases are normal. No free air, portal venous gas, or pneumatosis seen on limited supine imaging. If there is concern for free air, an upright or decubitus film should be obtained. Air-filled loops of large and small bowel are identified. An ileus is not excluded. No evidence of obstruction. No other acute abnormalities. IMPRESSION: Air-filled loops of large and small bowel could be due to the recent endoscopy. Ileus not excluded. No obstruction noted. No free air identified. If there is concern however an upright or decubitus film should be obtained. Electronically Signed   By: Dorise Bullion III M.D   On: 05/07/2020 14:54    Scheduled Meds: . sodium chloride   Intravenous Once  . furosemide  20 mg Oral Daily  . lisinopril  20 mg Oral Daily  . ondansetron      . pantoprazole (PROTONIX) IV  40 mg Intravenous Q12H  . potassium chloride  10 mEq Oral Daily  .  propranolol  60 mg Oral Daily  . simvastatin  20 mg Oral Daily  . tiotropium  18 mcg Inhalation q morning - 10a  . cyanocobalamin  1,000 mcg Oral Daily   Continuous Infusions: . sodium chloride 100 mL/hr (05/07/20 1115)     LOS: 1 day   Time spent: 30 minutes.  Lorella Nimrod, MD [facility]  If 7PM-7AM, please contact night-coverage [email or web address]  05/07/2020, 3:52 PM   This record has been created using Systems analyst. Errors have been sought and corrected,but may not always be located. Such creation errors do not reflect on the standard of care.

## 2020-05-07 NOTE — Transfer of Care (Signed)
Immediate Anesthesia Transfer of Care Note  Patient: Hailey Farrell  Procedure(s) Performed: ENTEROSCOPY with Adult colonoscope (N/A )  Patient Location: PACU  Anesthesia Type:General  Level of Consciousness: sedated  Airway & Oxygen Therapy: Patient Spontanous Breathing  Post-op Assessment: Report given to RN and Post -op Vital signs reviewed and stable  Post vital signs: Reviewed and stable  Last Vitals:  Vitals Value Taken Time  BP    Temp    Pulse    Resp    SpO2      Last Pain:  Vitals:   05/07/20 0727  TempSrc: Oral  PainSc: 0-No pain         Complications: No complications documented.

## 2020-05-07 NOTE — Progress Notes (Signed)
Vonda Antigua, MD [address], [place], [place], [place], [address] [address], [place], [place], [place], [address] Phone: [phone]  Fax: [phone]   Subjective: Patient denies any sources of bleeding.  Hemoglobin responded to transfusion.   Objective: Exam: Vital signs in last 24 hours: Vitals:   05/06/20 1923 05/06/20 2340 05/07/20 0422 05/07/20 0727  BP: (!) 152/80 (!) 146/85 (!) 159/90 130/75  Pulse: 87 93 94 90  Resp: 18 16 16 18   Temp: 98.8 F (37.1 C) 97.9 F (36.6 C) 98.8 F (37.1 C) 98.6 F (37 C)  TempSrc: Oral Oral Oral Oral  SpO2: 100% 100% 99% 93%  Weight:      Height:       Weight change:   Intake/Output Summary (Last 24 hours) at 05/07/2020 1149 Last data filed at 05/06/2020 2030 Gross per 24 hour  Intake 1020 ml  Output --  Net 1020 ml    General: No acute distress, AAO x3 Abd: Soft, NT/ND, No HSM Skin: Warm, no rashes Neck: Supple, Trachea midline   Lab Results: Lab Results  Component Value Date   WBC 5.9 05/07/2020   HGB 7.8 (L) 05/07/2020   HCT 25.6 (L) 05/07/2020   MCV 70.1 (L) 05/07/2020   PLT 332 05/07/2020   Micro Results: Recent Results (from the past 240 hour(s))  Resp Panel by RT-PCR (Flu A&B, Covid) Nasopharyngeal Swab     Status: None   Collection Time: 05/06/20  5:37 AM   Specimen: Nasopharyngeal Swab; Nasopharyngeal(NP) swabs in vial transport medium  Result Value Ref Range Status   SARS Coronavirus 2 by RT PCR NEGATIVE NEGATIVE Final    Comment: (NOTE) SARS-CoV-2 target nucleic acids are NOT DETECTED.  The SARS-CoV-2 RNA is generally detectable in upper respiratory specimens during the acute phase of infection. The lowest concentration of SARS-CoV-2 viral copies this assay can detect is 138 copies/mL. A negative result does not preclude SARS-Cov-2 infection and should not be used as the sole basis for treatment or other patient management decisions. A negative result may occur with  improper  specimen collection/handling, submission of specimen other than nasopharyngeal swab, presence of viral mutation(s) within the areas targeted by this assay, and inadequate number of viral copies(<138 copies/mL). A negative result must be combined with clinical observations, patient history, and epidemiological information. The expected result is Negative.  Fact Sheet for Patients:  EntrepreneurPulse.com.au  Fact Sheet for Healthcare Providers:  IncredibleEmployment.be  This test is no t yet approved or cleared by the [place] FDA and  has been authorized for detection and/or diagnosis of SARS-CoV-2 by FDA under an Emergency Use Authorization (EUA). This EUA will remain  in effect (meaning this test can be used) for the duration of the COVID-19 declaration under Section 564(b)(1) of the Act, 21 U.S.C.section 360bbb-3(b)(1), unless the authorization is terminated  or revoked sooner.       Influenza A by PCR NEGATIVE NEGATIVE Final   Influenza B by PCR NEGATIVE NEGATIVE Final    Comment: (NOTE) The Xpert Xpress SARS-CoV-2/FLU/RSV plus assay is intended as an aid in the diagnosis of influenza from Nasopharyngeal swab specimens and should not be used as a sole basis for treatment. Nasal washings and aspirates are unacceptable for Xpert Xpress SARS-CoV-2/FLU/RSV testing.  Fact Sheet for Patients: EntrepreneurPulse.com.au  Fact Sheet for Healthcare Providers: IncredibleEmployment.be  This test is not yet approved or cleared by the [place] FDA and has been authorized for detection and/or diagnosis of SARS-CoV-2 by FDA under  an Emergency Use Authorization (EUA). This EUA will remain in effect (meaning this test can be used) for the duration of the COVID-19 declaration under Section 564(b)(1) of the Act, 21 U.S.C. section 360bbb-3(b)(1), unless the authorization is terminated or revoked.  Performed at  [facility], [address] [place], [place] [address]    Studies/Results: No results found. Medications:  Scheduled Meds: . [MAR Hold] sodium chloride   Intravenous Once  . [MAR Hold] furosemide  20 mg Oral Daily  . [MAR Hold] lisinopril  20 mg Oral Daily  . [MAR Hold] pantoprazole (PROTONIX) IV  40 mg Intravenous Q12H  . [MAR Hold] potassium chloride  10 mEq Oral Daily  . [MAR Hold] propranolol  60 mg Oral Daily  . [MAR Hold] simvastatin  20 mg Oral Daily  . [MAR Hold] tiotropium  18 mcg Inhalation q morning - 10a  . [MAR Hold] cyanocobalamin  1,000 mcg Oral Daily   Continuous Infusions: . sodium chloride 100 mL/hr (05/07/20 1115)   PRN Meds:.[MAR Hold] acetaminophen **OR** [MAR Hold] acetaminophen, [MAR Hold] ondansetron **OR** [MAR Hold] ondansetron (ZOFRAN) IV, [MAR Hold] rOPINIRole, [MAR Hold] SUMAtriptan, [MAR Hold] traZODone   Assessment: Active Problems:   Symptomatic anemia    Plan: Proceed with push endoscopy today for further evaluation of iron deficiency anemia  If above exam is negative, patient may need colonoscopy tomorrow, and/or small bowel capsule study for further evaluation of her severe iron deficiency anemia  I have discussed alternative options, risks & benefits,  which include, but are not limited to, bleeding, infection, perforation,respiratory complication & drug reaction.  The patient agrees with this plan & written consent will be obtained.    In addition the above, the risks of small bowel capsule getting stuck in the GI tract were discussed. Need for surgery for removal of the capsule, if this occurs were discussed as well. This usually occurs if a stricture, mass or abnormality is present in the small bowel. Pt verbalized understanding and is agreeable to proceed with small bowel capsule study.   PPI IV twice daily  Continue serial CBCs and transfuse PRN Avoid NSAIDs Maintain 2 large-bore IV lines Please page GI with any acute  hemodynamic changes, or signs of active GI bleeding    LOS: 1 day   Vonda Antigua, MD 05/07/2020, 11:49 AM

## 2020-05-07 NOTE — OR Nursing (Signed)
Patient c/o abdominal pain and nausea.  zofran administered per IV and Dr Bonna Gains ordered a KUB.  We also placed her on the bedside comode for over 18min. She continued to pass a lot of gas but still c/o pain.  KUB was obtained after patient was returned to bed.  Patient stated she was still in pain and passing gas but not quite as bad as earlier

## 2020-05-07 NOTE — Anesthesia Preprocedure Evaluation (Signed)
Anesthesia Evaluation  Patient identified by MRN, date of birth, ID band Patient awake    Reviewed: Allergy & Precautions, H&P , NPO status , Patient's Chart, lab work & pertinent test results  History of Anesthesia Complications Negative for: history of anesthetic complications  Airway Mallampati: III  TM Distance: >3 FB Neck ROM: full    Dental  (+) Chipped, Poor Dentition, Missing, Dental Advidsory Given   Pulmonary shortness of breath and with exertion, COPD, neg recent URI, Current Smoker, former smoker,           Cardiovascular Exercise Tolerance: Good hypertension, (-) angina(-) Past MI (-) dysrhythmias (-) Valvular Problems/Murmurs     Neuro/Psych  Headaches, neg Seizures  Neuromuscular disease negative psych ROS   GI/Hepatic Neg liver ROS, hiatal hernia, PUD, GERD  Medicated and Controlled,  Endo/Other  negative endocrine ROS  Renal/GU      Musculoskeletal  (+) Arthritis ,   Abdominal   Peds  Hematology  (+) Blood dyscrasia, anemia ,   Anesthesia Other Findings Past Medical History: No date: Anemia No date: Arthritis 1990: Breast cancer ([place])     Comment:  Right No date: COPD (chronic obstructive pulmonary disease) (HCC) No date: Dyspnea     Comment:  DOE No date: Elevated lipids No date: Hypertension No date: Lower extremity edema No date: Migraines No date: Migraines No date: Restless leg syndrome No date: Rotator cuff injury  Past Surgical History: No date: ABDOMINAL HYSTERECTOMY 08/2017: BACK SURGERY     Comment:  CERVICAL FUSION 1990: BREAST BIOPSY; Right     Comment:  LUMPECTOMY.  took lymph nodes 07/20/2017: COLONOSCOPY WITH PROPOFOL; N/A     Comment:  Procedure: COLONOSCOPY WITH PROPOFOL;  Surgeon:               Virgel Manifold, MD;  Location: [facility] ENDOSCOPY;                Service: Endoscopy;  Laterality: N/A; 08/08/2017: ENTEROSCOPY; N/A     Comment:  Procedure: ENTEROSCOPY;   Surgeon: Lin Landsman,               MD;  Location: [facility] ENDOSCOPY;  Service:               Gastroenterology;  Laterality: N/A; 04/13/2017: ESOPHAGOGASTRODUODENOSCOPY; Left     Comment:  Procedure: ESOPHAGOGASTRODUODENOSCOPY (EGD);  Surgeon:               Virgel Manifold, MD;  Location: [facility] ENDOSCOPY;                Service: Endoscopy;  Laterality: Left; 07/20/2017: ESOPHAGOGASTRODUODENOSCOPY (EGD) WITH PROPOFOL; N/A     Comment:  Procedure: ESOPHAGOGASTRODUODENOSCOPY (EGD) WITH               PROPOFOL;  Surgeon: Virgel Manifold, MD;  Location:               [facility] ENDOSCOPY;  Service: Endoscopy;  Laterality: N/A; 12/12/2017: ESOPHAGOGASTRODUODENOSCOPY (EGD) WITH PROPOFOL; N/A     Comment:  Procedure: ESOPHAGOGASTRODUODENOSCOPY (EGD) WITH               PROPOFOL;  Surgeon: Virgel Manifold, MD;  Location:               [facility] ENDOSCOPY;  Service: Endoscopy;  Laterality: N/A; 1990: MEDIAL PARTIAL KNEE REPLACEMENT; Left     Comment:  torn ligament. no knee replacement at that time No date: PARTIAL HYSTERECTOMY 03/01/2016: SHOULDER ARTHROSCOPY WITH OPEN ROTATOR  CUFF REPAIR; Left     Comment:  Procedure: SHOULDER ARTHROSCOPY WITH OPEN ROTATOR CUFF               REPAIR;  Surgeon: Thornton Park, MD;  Location: [facility]               ORS;  Service: Orthopedics;  Laterality: Left;  BMI    Body Mass Index:  31.65 kg/m      Reproductive/Obstetrics negative OB ROS                             Anesthesia Physical  Anesthesia Plan  ASA: III  Anesthesia Plan: General   Post-op Pain Management:    Induction: Intravenous  PONV Risk Score and Plan: Treatment may vary due to age or medical condition, Propofol infusion and TIVA  Airway Management Planned: Natural Airway and Nasal Cannula  Additional Equipment:   Intra-op Plan:   Post-operative Plan:   Informed Consent: I have reviewed the patients History and Physical, chart, labs and discussed the  procedure including the risks, benefits and alternatives for the proposed anesthesia with the patient or authorized representative who has indicated his/her understanding and acceptance.     Dental Advisory Given  Plan Discussed with: Anesthesiologist, CRNA and Surgeon  Anesthesia Plan Comments: (Patient declines spinal and requests GA  Patient consented for risks of anesthesia including but not limited to:  - adverse reactions to medications - damage to teeth, lips or other oral mucosa - sore throat or hoarseness - Damage to heart, brain, lungs or loss of life  Patient voiced understanding.)        Anesthesia Quick Evaluation

## 2020-05-07 NOTE — Op Note (Signed)
[facility] Gastroenterology Patient Name: Hailey Farrell Procedure Date: 05/07/2020 11:54 AM MRN: [id number] Account #: [id number] Date of Birth: 02-03-1951 Admit Type: Inpatient Age: 69 Room: [facility] ENDO ROOM 1 Gender: Female Note Status: Finalized Procedure:             Small bowel enteroscopy Indications:           Iron deficiency anemia Providers:             Miosha Behe B. Bonna Gains MD, MD Referring MD:          Baxter Kail. Rebeca Alert MD, MD (Referring MD) Medicines:             General Anesthesia Complications:         No immediate complications. Procedure:             Pre-Anesthesia Assessment:                        - Prior to the procedure, a History and Physical was                         performed, and patient medications and allergies were                         reviewed. The patient is competent. The risks and                         benefits of the procedure and the sedation options and                         risks were discussed with the patient. All questions                         were answered and informed consent was obtained.                         Patient identification and proposed procedure were                         verified by the physician, the nurse, the                         anesthesiologist, the anesthetist and the technician                         in the pre-procedure area in the procedure room in the                         endoscopy suite. Mental Status Examination: alert and                         oriented. Airway Examination: normal oropharyngeal                         airway and neck mobility. Respiratory Examination:                         clear to auscultation. CV Examination: normal.  Prophylactic Antibiotics: The patient does not require                         prophylactic antibiotics. Prior Anticoagulants: The                         patient has taken no previous anticoagulant or                          antiplatelet agents. ASA Grade Assessment: III - A                         patient with severe systemic disease. After reviewing                         the risks and benefits, the patient was deemed in                         satisfactory condition to undergo the procedure. The                         anesthesia plan was to use general anesthesia.                         Immediately prior to administration of medications,                         the patient was re-assessed for adequacy to receive                         sedatives. The heart rate, respiratory rate, oxygen                         saturations, blood pressure, adequacy of pulmonary                         ventilation, and response to care were monitored                         throughout the procedure. The physical status of the                         patient was re-assessed after the procedure.                        After obtaining informed consent, the endoscope was                         passed under direct vision. Throughout the procedure,                         the patient's blood pressure, pulse, and oxygen                         saturations were monitored continuously. The                         Colonoscope was introduced through the mouth and  advanced to the jejunum, to the 160 cm mark (from the                         incisors). The small bowel enteroscopy was                         accomplished with ease. The patient tolerated the                         procedure well. Findings:      The examined esophagus was normal.      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 Coagulation for bleeding prevention using argon plasma was       successful.      A small hiatal hernia was present.      The exam of the stomach was otherwise normal.      Multiple angioectasias with no bleeding were found in the entire       examined duodenum. Coagulation for  bleeding prevention using argon       plasma was successful.      Patchy mild mucosal changes characterized by nodularity were found in       the duodenal bulb. Due to patient's severe iron deficiency biopsies were       not done. This has been evaluated and biopsied on previous procedures.       These could represent pancreatic rests.      A few angioectasias with no bleeding were found in the entire examined       portion of jejunum. Coagulation for bleeding prevention using argon       plasma was successful. Impression:            - Normal esophagus.                        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                         Treated with argon plasma coagulation (APC).                        - Small hiatal hernia.                        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                         Treated with argon plasma coagulation (APC).                        - Mucosal changes in the duodenum.                        - A few non-bleeding angioectasias in the jejunum.                         Treated with argon plasma coagulation (APC).                        - No specimens collected. 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 If hemoglobin stays stable, pt should                         follow up in GI clinic closely to repeat Hemoglobin                         and discuss colonoscopy and/or small bowel capsule                         study as an outpatient.                        - However, if hemoglobin drops as an inpatient patient                         would need these procedures as an inpatient. Dr. Allen Norris                         will follow the patient tomorrow and determine need                         for inpatient procedures as necessary.                        - The findings and recommendations were  discussed with                         the patient.                        - The findings and recommendations were discussed with                         the patient's family.                        - Iron replacement with Hematology (Dr. Grayland Ormond as an                         inpatient) as necessary. Procedure Code(s):     --- Professional ---                        [phone], Small intestinal endoscopy, enteroscopy beyond                         second portion of duodenum, not including ileum; with                         control of bleeding (eg, injection, bipolar cautery,                         unipolar cautery, laser, heater probe, stapler, plasma  coagulator) Diagnosis Code(s):     --- Professional ---                        K31.819, Angiodysplasia of stomach and duodenum                         without bleeding                        K44.9, Diaphragmatic hernia without obstruction or                         gangrene                        K31.89, Other diseases of stomach and duodenum                        K55.20, Angiodysplasia of colon without hemorrhage                        D50.9, Iron deficiency anemia, unspecified CPT copyright 2019 [employer]. All rights reserved. The codes documented in this report are preliminary and upon coder review may  be revised to meet current compliance requirements.  Vonda Antigua, MD Margretta Sidle B. Bonna Gains MD, MD 05/07/2020 1:16:14 PM This report has been signed electronically. Number of Addenda: 0 Note Initiated On: 05/07/2020 11:54 AM Estimated Blood Loss:  Estimated blood loss: none.      [facility]

## 2020-05-07 NOTE — Anesthesia Postprocedure Evaluation (Signed)
Anesthesia Post Note  Patient: Hailey Farrell  Procedure(s) Performed: ENTEROSCOPY with Adult colonoscope (N/A )  Patient location during evaluation: Endoscopy Anesthesia Type: General Level of consciousness: awake and alert Pain management: pain level controlled Vital Signs Assessment: post-procedure vital signs reviewed and stable Respiratory status: spontaneous breathing, nonlabored ventilation, respiratory function stable and patient connected to nasal cannula oxygen Cardiovascular status: blood pressure returned to baseline and stable Postop Assessment: no apparent nausea or vomiting Anesthetic complications: no   No complications documented.   Last Vitals:  Vitals:   05/07/20 0727 05/07/20 1240  BP: 130/75 92/61  Pulse: 90 67  Resp: 18 (!) 23  Temp: 37 C (!) 36.1 C  SpO2: 93% 91%    Last Pain:  Vitals:   05/07/20 1240  TempSrc:   PainSc: Asleep                 Martha Clan

## 2020-05-08 LAB — CBC
HCT: 26.5 % — ABNORMAL LOW (ref 36.0–46.0)
Hemoglobin: 7.9 g/dL — ABNORMAL LOW (ref 12.0–15.0)
MCH: 21.4 pg — ABNORMAL LOW (ref 26.0–34.0)
MCHC: 29.8 g/dL — ABNORMAL LOW (ref 30.0–36.0)
MCV: 71.6 fL — ABNORMAL LOW (ref 80.0–100.0)
Platelets: 320 10*3/uL (ref 150–400)
RBC: 3.7 MIL/uL — ABNORMAL LOW (ref 3.87–5.11)
RDW: 29.2 % — ABNORMAL HIGH (ref 11.5–15.5)
WBC: 9.4 10*3/uL (ref 4.0–10.5)
nRBC: 1.1 % — ABNORMAL HIGH (ref 0.0–0.2)

## 2020-05-08 MED ORDER — OMEPRAZOLE 20 MG PO CPDR: 20.0000 mg | DELAYED_RELEASE_CAPSULE | Freq: Two times a day (BID) | ORAL | 1 refills | Status: DC

## 2020-05-08 MED ORDER — PROPRANOLOL HCL 60 MG PO TABS: 60.0000 mg | ORAL_TABLET | Freq: Every day | ORAL | 1 refills | Status: DC

## 2020-05-08 MED ORDER — FE FUMARATE-B12-VIT C-FA-IFC PO CAPS
1.0000 | ORAL_CAPSULE | Freq: Three times a day (TID) | ORAL | 2 refills | Status: DC
Start: 2020-05-08 — End: 2021-07-12

## 2020-05-08 MED ORDER — FE FUMARATE-B12-VIT C-FA-IFC PO CAPS
1.0000 | ORAL_CAPSULE | Freq: Three times a day (TID) | ORAL | Status: DC
  Filled 2020-05-08 (×3): qty 1

## 2020-05-08 NOTE — Discharge Instructions (Signed)
Anemia  Anemia is a condition in which you do not have enough red blood cells or hemoglobin. Hemoglobin is a substance in red blood cells that carries oxygen. When you do not have enough red blood cells or hemoglobin (are anemic), your body cannot get enough oxygen and your organs may not work properly. As a result, you may feel very tired or have other problems. What are the causes? Common causes of anemia include:  Excessive bleeding. Anemia can be caused by excessive bleeding inside or outside the body, including bleeding from the intestine or from periods in women.  Poor nutrition.  Long-lasting (chronic) kidney, thyroid, and liver disease.  Bone marrow disorders.  Cancer and treatments for cancer.  HIV (human immunodeficiency virus) and AIDS (acquired immunodeficiency syndrome).  Treatments for HIV and AIDS.  Spleen problems.  Blood disorders.  Infections, medicines, and autoimmune disorders that destroy red blood cells. What are the signs or symptoms? Symptoms of this condition include:  Minor weakness.  Dizziness.  Headache.  Feeling heartbeats that are irregular or faster than normal (palpitations).  Shortness of breath, especially with exercise.  Paleness.  Cold sensitivity.  Indigestion.  Nausea.  Difficulty sleeping.  Difficulty concentrating. Symptoms may occur suddenly or develop slowly. If your anemia is mild, you may not have symptoms. How is this diagnosed? This condition is diagnosed based on:  Blood tests.  Your medical history.  A physical exam.  Bone marrow biopsy. Your health care provider may also check your stool (feces) for blood and may do additional testing to look for the cause of your bleeding. You may also have other tests, including:  Imaging tests, such as a CT scan or MRI.  Endoscopy.  Colonoscopy. How is this treated? Treatment for this condition depends on the cause. If you continue to lose a lot of blood, you may  need to be treated at a hospital. Treatment may include:  Taking supplements of iron, vitamin S31, or folic acid.  Taking a hormone medicine (erythropoietin) that can help to stimulate red blood cell growth.  Having a blood transfusion. This may be needed if you lose a lot of blood.  Making changes to your diet.  Having surgery to remove your spleen. Follow these instructions at home:  Take over-the-counter and prescription medicines only as told by your health care provider.  Take supplements only as told by your health care provider.  Follow any diet instructions that you were given.  Keep all follow-up visits as told by your health care provider. This is important. Contact a health care provider if:  You develop new bleeding anywhere in the body. Get help right away if:  You are very weak.  You are short of breath.  You have pain in your abdomen or chest.  You are dizzy or feel faint.  You have trouble concentrating.  You have bloody or black, tarry stools.  You vomit repeatedly or you vomit up blood. Summary  Anemia is a condition in which you do not have enough red blood cells or enough of a substance in your red blood cells that carries oxygen (hemoglobin).  Symptoms may occur suddenly or develop slowly.  If your anemia is mild, you may not have symptoms.  This condition is diagnosed with blood tests as well as a medical history and physical exam. Other tests may be needed.  Treatment for this condition depends on the cause of the anemia. This information is not intended to replace advice given to you by  your health care provider. Make sure you discuss any questions you have with your health care provider. Document Revised: 05/04/2017 Document Reviewed: 06/23/2016 Elsevier Patient Education  [place].

## 2020-05-08 NOTE — Discharge Summary (Signed)
Physician Discharge Summary  Hailey Farrell:[id number] DOB: 1950/12/08 DOA: 05/06/2020  PCP: Letta Median, MD  Admit date: 05/06/2020 Discharge date: 05/08/2020  Admitted From: Home Disposition: Home  Recommendations for Outpatient Follow-up:  1. Follow up with PCP in 1-2 weeks 2. Please obtain BMP/CBC in one week 3. Please follow up on the following pending results: None  Home Health: No Equipment/Devices: None Discharge Condition: Stable CODE STATUS: Full Diet recommendation: Heart Healthy   Brief/Interim Summary: [age] y.o.African-American femalewith a known history ofCOPD, hypertension, migraine, restless leg syndrome and chronic iron deficiency anemia getting iron transfusions and requiring blood transfusions, with history of focal intestinal metaplasia and H. pylori, whopresented to the emergency room with acute onset of dizziness and lightheadednesswith presyncopeand severe anemia on outpatient lab ordered by her primary care physician. She admitted to recent fatigue and tiredness as well as dyspnea on exertion. She denies any nausea or vomiting or diarrhea. No bleeding diathesis. No melena or bright red bleeding per rectum. No chest pain or palpitations. No cough or wheezing orhemoptysis.  Bedside stool occult card was negative.  Found to have hemoglobin of 3.7 and received 3 units of PRBC.  Iron studies with severe iron deficiency.  Hematology and GI was consulted.  Concern of slow GI bleeding with angiodysplasias.  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  No active bleeding.  Hemoglobin remained stable.  She was started on iron supplement and discharged home.  She will follow up with GI for repeat colonoscopy  to look for more AVMs.  She will continue with rest of her home meds and follow-up with her providers.  Discharge Diagnoses:  Active Problems:   Symptomatic anemia   AVM (arteriovenous malformation) of small bowel, acquired with hemorrhage  Discharge Instructions  Discharge Instructions    Diet - low sodium heart healthy   Complete by: As directed    Discharge instructions   Complete by: As directed    It was pleasure taking care of you. We are adding Prilosec or omeprazole 20 mg twice daily according to the advice of your gastroenterologist. Please follow-up with your gastroenterologist in 2 to 4 weeks for further recommendations. We also changed your vitamin B12 with a combination pill which includes iron, please take it as directed. Keep yourself well-hydrated. As we discussed if that combination pull is too expensive you can always continue with your B12 and ger ferrous sulphate 325 mg from pharmacy and take it 3 times a day   Increase activity slowly   Complete by: As directed      Allergies as of 05/08/2020      Reactions   Latex Rash   Welts over body      Medication List    STOP taking these medications   cyanocobalamin 1000 MCG tablet     TAKE these medications   albuterol 108 (90 Base) MCG/ACT inhaler Commonly known as: VENTOLIN HFA Inhale 2 puffs into the lungs every 6 (six) hours as needed for wheezing.   amitriptyline 25 MG tablet Commonly known as: ELAVIL Take 25 mg by mouth at bedtime.   budesonide-formoterol 160-4.5 MCG/ACT inhaler Commonly known as: SYMBICORT Inhale 2 puffs into the lungs daily as needed (wheezing or shortness of breath).   ferrous [id number] C-folic acid capsule Commonly known as: TRINSICON /  FOLTRIN Take 1 capsule by mouth 3 (three) times daily after meals.   furosemide 20 MG tablet Commonly known as: LASIX Take 20 mg daily by mouth.   lisinopril 20 MG tablet Commonly known as: ZESTRIL Take 20 mg by mouth  daily.   omeprazole 20 MG capsule Commonly known as: PRILOSEC Take 1 capsule (20 mg total) by mouth 2 (two) times daily before a meal.   potassium chloride 10 MEQ CR capsule Commonly known as: MICRO-K Take 10 mEq by mouth daily.   propranolol 60 MG tablet Commonly known as: INDERAL Take 1 tablet (60 mg total) by mouth daily.   rOPINIRole 0.5 MG tablet Commonly known as: REQUIP Take 0.5 mg by mouth at bedtime as needed (restless leg syndrome).   simvastatin 20 MG tablet Commonly known as: ZOCOR Take 20 mg by mouth daily.   SUMAtriptan 6 MG/0.5ML Soln injection Commonly known as: IMITREX Inject 6 mg every 2 (two) hours as needed into the skin for migraine or headache. May repeat in 2 hours if headache persists or recurs.   tiotropium 18 MCG inhalation capsule Commonly known as: SPIRIVA Place 18 mcg into inhaler and inhale daily.   Voltaren 1 % Gel Generic drug: diclofenac Sodium Apply 2 g topically daily as needed.       Follow-up Information    Bender, Durene Cal, MD. Schedule an appointment as soon as possible for a visit.   Specialty: Family Medicine Contact information: [place] [address] [phone]        Virgel Manifold, MD. Schedule an appointment as soon as possible for a visit in 1 month(s).   Specialty: Gastroenterology Contact information: [place] [address] [phone]              Allergies  Allergen Reactions  . Latex Rash    Welts over body    Consultations:  GI  Hematology  Procedures/Studies: DG Abd 1 View - KUB  Result Date: 05/07/2020 CLINICAL DATA:  Abdominal pain after endoscopy EXAM: ABDOMEN - 1 VIEW COMPARISON:  None. FINDINGS: The lung bases are normal. No free air, portal venous gas, or pneumatosis seen on limited supine imaging. If there is concern for free air, an upright or decubitus film should be obtained. Air-filled loops of large and small bowel are  identified. An ileus is not excluded. No evidence of obstruction. No other acute abnormalities. IMPRESSION: Air-filled loops of large and small bowel could be due to the recent endoscopy. Ileus not excluded. No obstruction noted. No free air identified. If there is concern however an upright or decubitus film should be obtained. Electronically Signed   By: Dorise Bullion III M.D   On: 05/07/2020 14:54     Subjective: Patient was feeling better when seen today.  Denies any complaints.  Wants to go home.  Discharge Exam: Vitals:   05/08/20 0527 05/08/20 0841  BP: 122/75 119/64  Pulse: 84 79  Resp: 16 20  Temp: 98.9 F (37.2 C) 98.1 F (36.7 C)  SpO2: 91% 100%   Vitals:   05/07/20 2039 05/08/20 0053 05/08/20 0527 05/08/20 0841  BP: 96/69 127/68 122/75 119/64  Pulse: 78 80 84 79  Resp: 20 16 16 20   Temp: 99.2 F (37.3 C) 99 F (37.2 C) 98.9 F (37.2 C) 98.1 F (36.7 C)  TempSrc:      SpO2: 100% 100% 91% 100%  Weight:      Height:  General: Pt is alert, awake, not in acute distress Cardiovascular: RRR, S1/S2 +, no rubs, no gallops Respiratory: CTA bilaterally, no wheezing, no rhonchi Abdominal: Soft, NT, ND, bowel sounds + Extremities: no edema, no cyanosis   The results of significant diagnostics from this hospitalization (including imaging, microbiology, ancillary and laboratory) are listed below for reference.    Microbiology: Recent Results (from the past 240 hour(s))  Resp Panel by RT-PCR (Flu A&B, Covid) Nasopharyngeal Swab     Status: None   Collection Time: 05/06/20  5:37 AM   Specimen: Nasopharyngeal Swab; Nasopharyngeal(NP) swabs in vial transport medium  Result Value Ref Range Status   SARS Coronavirus 2 by RT PCR NEGATIVE NEGATIVE Final    Comment: (NOTE) SARS-CoV-2 target nucleic acids are NOT DETECTED.  The SARS-CoV-2 RNA is generally detectable in upper respiratory specimens during the acute phase of infection. The lowest concentration of  SARS-CoV-2 viral copies this assay can detect is 138 copies/mL. A negative result does not preclude SARS-Cov-2 infection and should not be used as the sole basis for treatment or other patient management decisions. A negative result may occur with  improper specimen collection/handling, submission of specimen other than nasopharyngeal swab, presence of viral mutation(s) within the areas targeted by this assay, and inadequate number of viral copies(<138 copies/mL). A negative result must be combined with clinical observations, patient history, and epidemiological information. The expected result is Negative.  Fact Sheet for Patients:  EntrepreneurPulse.com.au  Fact Sheet for Healthcare Providers:  IncredibleEmployment.be  This test is no t yet approved or cleared by the [place] FDA and  has been authorized for detection and/or diagnosis of SARS-CoV-2 by FDA under an Emergency Use Authorization (EUA). This EUA will remain  in effect (meaning this test can be used) for the duration of the COVID-19 declaration under Section 564(b)(1) of the Act, 21 U.S.C.section 360bbb-3(b)(1), unless the authorization is terminated  or revoked sooner.       Influenza A by PCR NEGATIVE NEGATIVE Final   Influenza B by PCR NEGATIVE NEGATIVE Final    Comment: (NOTE) The Xpert Xpress SARS-CoV-2/FLU/RSV plus assay is intended as an aid in the diagnosis of influenza from Nasopharyngeal swab specimens and should not be used as a sole basis for treatment. Nasal washings and aspirates are unacceptable for Xpert Xpress SARS-CoV-2/FLU/RSV testing.  Fact Sheet for Patients: EntrepreneurPulse.com.au  Fact Sheet for Healthcare Providers: IncredibleEmployment.be  This test is not yet approved or cleared by the [place] FDA and has been authorized for detection and/or diagnosis of SARS-CoV-2 by FDA under an Emergency Use  Authorization (EUA). This EUA will remain in effect (meaning this test can be used) for the duration of the COVID-19 declaration under Section 564(b)(1) of the Act, 21 U.S.C. section 360bbb-3(b)(1), unless the authorization is terminated or revoked.  Performed at [facility], [address] [place], [place] [address]      Labs: BNP (last 3 results) No results for input(s): BNP in the last 8760 hours. Basic Metabolic Panel: Recent Labs  Lab 05/06/20 0309 05/06/20 0537  NA 136 138  K 4.0 4.3  CL 106 106  CO2 23 23  GLUCOSE 131* 108*  BUN 10 10  CREATININE 0.98 0.98  CALCIUM 8.2* 8.4*   Liver Function Tests: Recent Labs  Lab 05/06/20 0309  AST 19  ALT 14  ALKPHOS 30*  BILITOT 0.3  PROT 6.2*  ALBUMIN 3.7   No results for input(s): LIPASE, AMYLASE in the last 168 hours. No results  for input(s): AMMONIA in the last 168 hours. CBC: Recent Labs  Lab 05/06/20 0309 05/06/20 0537 05/06/20 1737 05/07/20 0753 05/08/20 0448  WBC 4.8 5.1 5.7 5.9 9.4  NEUTROABS 2.7  --   --   --   --   HGB 3.8* 3.7* 8.1* 7.8* 7.9*  HCT 14.2* 13.7* 26.2* 25.6* 26.5*  MCV 59.9* 59.6* 69.5* 70.1* 71.6*  PLT 397 371 332 332 320   Cardiac Enzymes: No results for input(s): CKTOTAL, CKMB, CKMBINDEX, TROPONINI in the last 168 hours. BNP: Invalid input(s): POCBNP CBG: No results for input(s): GLUCAP in the last 168 hours. D-Dimer No results for input(s): DDIMER in the last 72 hours. Hgb A1c No results for input(s): HGBA1C in the last 72 hours. Lipid Profile No results for input(s): CHOL, HDL, LDLCALC, TRIG, CHOLHDL, LDLDIRECT in the last 72 hours. Thyroid function studies No results for input(s): TSH, T4TOTAL, T3FREE, THYROIDAB in the last 72 hours.  Invalid input(s): FREET3 Anemia work up Recent Labs    05/06/20 0309 05/06/20 0537  FERRITIN  --  2*  TIBC  --  493*  IRON  --  12*  RETICCTPCT 1.3  --    Urinalysis    Component Value Date/Time   COLORURINE  COLORLESS (A) 05/06/2020 1539   APPEARANCEUR CLEAR (A) 05/06/2020 1539   LABSPEC 1.004 (L) 05/06/2020 1539   PHURINE 6.0 05/06/2020 1539   GLUCOSEU NEGATIVE 05/06/2020 1539   HGBUR NEGATIVE 05/06/2020 1539   BILIRUBINUR NEGATIVE 05/06/2020 1539   KETONESUR NEGATIVE 05/06/2020 1539   PROTEINUR NEGATIVE 05/06/2020 1539   NITRITE NEGATIVE 05/06/2020 1539   LEUKOCYTESUR MODERATE (A) 05/06/2020 1539   Sepsis Labs Invalid input(s): PROCALCITONIN,  WBC,  LACTICIDVEN Microbiology Recent Results (from the past 240 hour(s))  Resp Panel by RT-PCR (Flu A&B, Covid) Nasopharyngeal Swab     Status: None   Collection Time: 05/06/20  5:37 AM   Specimen: Nasopharyngeal Swab; Nasopharyngeal(NP) swabs in vial transport medium  Result Value Ref Range Status   SARS Coronavirus 2 by RT PCR NEGATIVE NEGATIVE Final    Comment: (NOTE) SARS-CoV-2 target nucleic acids are NOT DETECTED.  The SARS-CoV-2 RNA is generally detectable in upper respiratory specimens during the acute phase of infection. The lowest concentration of SARS-CoV-2 viral copies this assay can detect is 138 copies/mL. A negative result does not preclude SARS-Cov-2 infection and should not be used as the sole basis for treatment or other patient management decisions. A negative result may occur with  improper specimen collection/handling, submission of specimen other than nasopharyngeal swab, presence of viral mutation(s) within the areas targeted by this assay, and inadequate number of viral copies(<138 copies/mL). A negative result must be combined with clinical observations, patient history, and epidemiological information. The expected result is Negative.  Fact Sheet for Patients:  EntrepreneurPulse.com.au  Fact Sheet for Healthcare Providers:  IncredibleEmployment.be  This test is no t yet approved or cleared by the [place] FDA and  has been authorized for detection and/or diagnosis of  SARS-CoV-2 by FDA under an Emergency Use Authorization (EUA). This EUA will remain  in effect (meaning this test can be used) for the duration of the COVID-19 declaration under Section 564(b)(1) of the Act, 21 U.S.C.section 360bbb-3(b)(1), unless the authorization is terminated  or revoked sooner.       Influenza A by PCR NEGATIVE NEGATIVE Final   Influenza B by PCR NEGATIVE NEGATIVE Final    Comment: (NOTE) The Xpert Xpress SARS-CoV-2/FLU/RSV plus assay is intended as an aid in  the diagnosis of influenza from Nasopharyngeal swab specimens and should not be used as a sole basis for treatment. Nasal washings and aspirates are unacceptable for Xpert Xpress SARS-CoV-2/FLU/RSV testing.  Fact Sheet for Patients: EntrepreneurPulse.com.au  Fact Sheet for Healthcare Providers: IncredibleEmployment.be  This test is not yet approved or cleared by the [place] FDA and has been authorized for detection and/or diagnosis of SARS-CoV-2 by FDA under an Emergency Use Authorization (EUA). This EUA will remain in effect (meaning this test can be used) for the duration of the COVID-19 declaration under Section 564(b)(1) of the Act, 21 U.S.C. section 360bbb-3(b)(1), unless the authorization is terminated or revoked.  Performed at [facility], [address] [place], [place] [address]     Time coordinating discharge: Over 30 minutes  SIGNED:  Lorella Nimrod, MD  [facility] 05/08/2020, 9:54 AM  If 7PM-7AM, please contact night-coverage [email or web address]  This record has been created using Systems analyst. Errors have been sought and corrected,but may not always be located. Such creation errors do not reflect on the standard of care.

## 2020-05-10 ENCOUNTER — Encounter: Payer: Self-pay | Admitting: Gastroenterology

## 2020-05-12 ENCOUNTER — Encounter: Payer: Self-pay | Admitting: Gastroenterology

## 2020-05-12 ENCOUNTER — Ambulatory Visit (INDEPENDENT_AMBULATORY_CARE_PROVIDER_SITE_OTHER): Payer: Medicare HMO | Admitting: Gastroenterology

## 2020-05-12 VITALS — BP 135/74 | HR 86 | Temp 98.3°F | Wt 199.0 lb

## 2020-05-12 DIAGNOSIS — D509 Iron deficiency anemia, unspecified: Secondary | ICD-10-CM

## 2020-05-12 NOTE — Patient Instructions (Signed)
Please come next week for labs only.  We will see you in mid January 2022.

## 2020-05-13 NOTE — Progress Notes (Signed)
Vonda Antigua, MD [address]  [place]  [place], [place] [address]  Main: [phone]  Fax: [phone]   Primary Care Physician: Letta Median, MD   Chief Complaint  Patient presents with  . Anemia    HPI: Hailey Farrell is a 69 y.o. female here for posthospitalization follow-up for severe anemia with iron deficiency.  Patient reports feeling well since hospital discharge with no signs of bleeding.  States her energy level is now back and she does not have the fatigue anymore that she had prior to hospitalization.  Please see recent procedure report for details  Current Outpatient Medications  Medication Sig Dispense Refill  . albuterol (VENTOLIN HFA) 108 (90 Base) MCG/ACT inhaler Inhale 2 puffs into the lungs every 6 (six) hours as needed for wheezing.    Marland Kitchen amitriptyline (ELAVIL) 25 MG tablet Take 25 mg by mouth at bedtime.    . budesonide-formoterol (SYMBICORT) 160-4.5 MCG/ACT inhaler Inhale 2 puffs into the lungs daily as needed (wheezing or shortness of breath).     . diclofenac Sodium (VOLTAREN) 1 % GEL Apply 2 g topically daily as needed.    . ferrous [id number] C-folic acid (TRINSICON / FOLTRIN) capsule Take 1 capsule by mouth 3 (three) times daily after meals. 90 capsule 2  . furosemide (LASIX) 20 MG tablet Take 20 mg daily by mouth.     Marland Kitchen lisinopril (PRINIVIL,ZESTRIL) 20 MG tablet Take 20 mg by mouth daily.    Marland Kitchen omeprazole (PRILOSEC) 20 MG capsule Take 1 capsule (20 mg total) by mouth 2 (two) times daily before a meal. 60 capsule 1  . potassium chloride (MICRO-K) 10 MEQ CR capsule Take 10 mEq by mouth daily.    . propranolol (INDERAL) 60 MG tablet Take 1 tablet (60 mg total) by mouth daily. 30 tablet 1  . rOPINIRole (REQUIP) 0.5 MG tablet Take 0.5 mg by mouth at bedtime as needed (restless leg syndrome).     . simvastatin (ZOCOR) 20 MG tablet Take 20 mg by mouth daily.     . SUMAtriptan (IMITREX) 6 MG/0.5ML SOLN injection Inject 6 mg every 2  (two) hours as needed into the skin for migraine or headache. May repeat in 2 hours if headache persists or recurs.    Marland Kitchen tiotropium (SPIRIVA) 18 MCG inhalation capsule Place 18 mcg into inhaler and inhale daily.     No current facility-administered medications for this visit.    Allergies as of 05/12/2020 - Review Complete 05/12/2020  Allergen Reaction Noted  . Latex Rash 02/03/2015    ROS:  General: Negative for anorexia, weight loss, fever, chills, fatigue, weakness. ENT: Negative for hoarseness, difficulty swallowing , nasal congestion. CV: Negative for chest pain, angina, palpitations, dyspnea on exertion, peripheral edema.  Respiratory: Negative for dyspnea at rest, dyspnea on exertion, cough, sputum, wheezing.  GI: See history of present illness. GU:  Negative for dysuria, hematuria, urinary incontinence, urinary frequency, nocturnal urination.  Endo: Negative for unusual weight change.    Physical Examination:   BP 135/74   Pulse 86   Temp 98.3 F (36.8 C) (Oral)   Wt 199 lb (90.3 kg)   BMI 34.16 kg/m   General: Well-nourished, well-developed in no acute distress.  Eyes: No icterus. Conjunctivae pink. Mouth: Oropharyngeal mucosa moist and pink , no lesions erythema or exudate. Neck: Supple, Trachea midline Abdomen: Bowel sounds are normal, nontender, nondistended, no hepatosplenomegaly or masses, no abdominal bruits or hernia , no rebound or guarding.   Extremities:  No lower extremity edema. No clubbing or deformities. Neuro: Alert and oriented x 3.  Grossly intact. Skin: Warm and dry, no jaundice.   Psych: Alert and cooperative, normal mood and affect.   Labs: CMP     Component Value Date/Time   NA 138 05/06/2020 0537   K 4.3 05/06/2020 0537   CL 106 05/06/2020 0537   CO2 23 05/06/2020 0537   GLUCOSE 108 (H) 05/06/2020 0537   BUN 10 05/06/2020 0537   CREATININE 0.98 05/06/2020 0537   CALCIUM 8.4 (L) 05/06/2020 0537   PROT 6.2 (L) 05/06/2020 0309    ALBUMIN 3.7 05/06/2020 0309   AST 19 05/06/2020 0309   ALT 14 05/06/2020 0309   ALKPHOS 30 (L) 05/06/2020 0309   BILITOT 0.3 05/06/2020 0309   GFRNONAA >60 05/06/2020 0537   GFRAA >60 02/16/2018 1523   Lab Results  Component Value Date   WBC 9.4 05/08/2020   HGB 7.9 (L) 05/08/2020   HCT 26.5 (L) 05/08/2020   MCV 71.6 (L) 05/08/2020   PLT 320 05/08/2020    Imaging Studies: DG Abd 1 View - KUB  Result Date: 05/07/2020 CLINICAL DATA:  Abdominal pain after endoscopy EXAM: ABDOMEN - 1 VIEW COMPARISON:  None. FINDINGS: The lung bases are normal. No free air, portal venous gas, or pneumatosis seen on limited supine imaging. If there is concern for free air, an upright or decubitus film should be obtained. Air-filled loops of large and small bowel are identified. An ileus is not excluded. No evidence of obstruction. No other acute abnormalities. IMPRESSION: Air-filled loops of large and small bowel could be due to the recent endoscopy. Ileus not excluded. No obstruction noted. No free air identified. If there is concern however an upright or decubitus film should be obtained. Electronically Signed   By: Dorise Bullion III M.D   On: 05/07/2020 14:54    Assessment and Plan:   Hailey Farrell is a 69 y.o. y/o female here for follow-up of iron deficiency anemia due to multiple AVMs in the stomach and small bowel that were treated with APC recently  Repeat hemoglobin next week to ensure it is improving Patient already has follow-up with Dr. Grayland Ormond and would benefit from IV iron replacement by them.  If she has any new symptoms or recurrent fatigue I have advised her to let us know and she verbalized understanding  If her hemoglobin is improving as expected, no indication for repeat procedure.  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  However,  the appearance of her duodenal bulb could also be due to pancreatic rest.  Once acute anemia resolves, consider repeat upper endoscopy with biopsies, versus referral for EUS.  This was discussed with the patient and she is agreeable with the plan  Dr Vonda Antigua

## 2020-05-21 LAB — HEMOGLOBIN: Hemoglobin: 10.4 g/dL — ABNORMAL LOW (ref 11.1–15.9)

## 2020-05-26 ENCOUNTER — Telehealth: Payer: Self-pay

## 2020-05-26 NOTE — Telephone Encounter (Signed)
-----   Message from Virgel Manifold, MD sent at 05/24/2020 10:44 AM EST ----- Herb Grays please let the patient know, her hemoglobin is improving and it is 10.4.  Continue follow-up with hematology for iron replacement

## 2020-05-26 NOTE — Telephone Encounter (Signed)
Called patient and left her a detailed message letting her know that her hemoglobin was improving and to continue with her follow up with the hematologist and Dr. Bonna Gains.

## 2020-05-30 NOTE — Progress Notes (Signed)
[place]  [phone] [phone]  ID: Hailey Farrell OB: 17-Jan-1951  MR#: [id number]  [id number]  Patient Care Team: Letta Median, MD as PCP - General (Family Medicine)  CHIEF COMPLAINT: Iron deficiency anemia  INTERVAL HISTORY: Patient returns to clinic today for further evaluation and hospital follow-up.  She feels significantly improved and is at her baseline since receiving multiple units of packed red blood cells during her admission.  She currently feels well and is asymptomatic.  She does not complain of any further weakness or fatigue.  She has no neurologic complaints.  She denies any recent fevers or illnesses.  She has a good appetite and denies weight loss.    She denies any chest pain, shortness of breath, cough, or hemoptysis.  She denies any nausea, vomiting, constipation, or diarrhea.  She has no melena or hematochezia.  She has no urinary complaints.    Patient offers no specific complaints today.  REVIEW OF SYSTEMS:   Review of Systems  Constitutional: Negative.  Negative for fever, malaise/fatigue and weight loss.  Respiratory: Negative.  Negative for cough and shortness of breath.   Cardiovascular: Negative.  Negative for chest pain and leg swelling.  Gastrointestinal: Negative.  Negative for abdominal pain, blood in stool and melena.  Genitourinary: Negative.  Negative for hematuria.  Musculoskeletal: Negative.  Negative for back pain.  Skin: Negative.  Negative for rash.  Neurological: Negative.  Negative for dizziness, focal weakness, weakness and headaches.  Psychiatric/Behavioral: Negative.  The patient is not nervous/anxious.     As per HPI. Otherwise, a complete review of systems is negative.  PAST MEDICAL HISTORY: Past Medical History:  Diagnosis Date  . Anemia   . Arthritis   . Breast cancer ([place]) 1990   Right  . COPD (chronic obstructive pulmonary disease) ([place])   . Dyspnea    DOE  . Elevated  lipids   . Hypertension   . Lower extremity edema   . Migraines   . Migraines   . Personal history of radiation therapy 1990   Breast  . Restless leg syndrome   . Rotator cuff injury     PAST SURGICAL HISTORY: Past Surgical History:  Procedure Laterality Date  . ABDOMINAL HYSTERECTOMY    . BACK SURGERY  08/2017   CERVICAL FUSION  . BREAST BIOPSY Right 1990   LUMPECTOMY.  took lymph nodes  . COLONOSCOPY WITH PROPOFOL N/A 07/20/2017   Procedure: COLONOSCOPY WITH PROPOFOL;  Surgeon: Virgel Manifold, MD;  Location: [facility] ENDOSCOPY;  Service: Endoscopy;  Laterality: N/A;  . ENTEROSCOPY N/A 08/08/2017   Procedure: ENTEROSCOPY;  Surgeon: Lin Landsman, MD;  Location: [facility] ENDOSCOPY;  Service: Gastroenterology;  Laterality: N/A;  . ENTEROSCOPY N/A 05/07/2020   Procedure: ENTEROSCOPY with Adult colonoscope;  Surgeon: Virgel Manifold, MD;  Location: [facility] ENDOSCOPY;  Service: Endoscopy;  Laterality: N/A;  . ESOPHAGOGASTRODUODENOSCOPY Left 04/13/2017   Procedure: ESOPHAGOGASTRODUODENOSCOPY (EGD);  Surgeon: Virgel Manifold, MD;  Location: [facility] ENDOSCOPY;  Service: Endoscopy;  Laterality: Left;  . ESOPHAGOGASTRODUODENOSCOPY (EGD) WITH PROPOFOL N/A 07/20/2017   Procedure: ESOPHAGOGASTRODUODENOSCOPY (EGD) WITH PROPOFOL;  Surgeon: Virgel Manifold, MD;  Location: [facility] ENDOSCOPY;  Service: Endoscopy;  Laterality: N/A;  . ESOPHAGOGASTRODUODENOSCOPY (EGD) WITH PROPOFOL N/A 12/12/2017   Procedure: ESOPHAGOGASTRODUODENOSCOPY (EGD) WITH PROPOFOL;  Surgeon: Virgel Manifold, MD;  Location: [facility] ENDOSCOPY;  Service: Endoscopy;  Laterality: N/A;  . MEDIAL PARTIAL KNEE REPLACEMENT Left 1990   torn ligament. no knee replacement at that time  .  PARTIAL HYSTERECTOMY    . SHOULDER ARTHROSCOPY WITH OPEN ROTATOR CUFF REPAIR Left 03/01/2016   Procedure: SHOULDER ARTHROSCOPY WITH OPEN ROTATOR CUFF REPAIR;  Surgeon: Thornton Park, MD;  Location: [facility] ORS;  Service: Orthopedics;  Laterality:  Left;  . TOTAL KNEE ARTHROPLASTY Left 02/14/2018   Procedure: TOTAL KNEE ARTHROPLASTY;  Surgeon: Thornton Park, MD;  Location: [facility] ORS;  Service: Orthopedics;  Laterality: Left;    FAMILY HISTORY: Family History  Problem Relation Age of Onset  . CAD Mother   . CAD Sister   . Breast cancer Neg Hx     ADVANCED DIRECTIVES (Y/N):  N  HEALTH MAINTENANCE: Social History   Tobacco Use  . Smoking status: Current Some Day Smoker    Packs/day: 0.10    Years: 50.00    Pack years: 5.00    Types: Cigarettes    Last attempt to quit: 03/13/2016    Years since quitting: 4.2  . Smokeless tobacco: Never Used  Vaping Use  . Vaping Use: Never used  Substance Use Topics  . Alcohol use: No  . Drug use: No     Colonoscopy:  PAP:  Bone density:  Lipid panel:  Allergies  Allergen Reactions  . Latex Rash    Welts over body    Current Outpatient Medications  Medication Sig Dispense Refill  . albuterol (VENTOLIN HFA) 108 (90 Base) MCG/ACT inhaler Inhale 2 puffs into the lungs every 6 (six) hours as needed for wheezing.    Marland Kitchen amitriptyline (ELAVIL) 25 MG tablet Take 25 mg by mouth at bedtime.    . budesonide-formoterol (SYMBICORT) 160-4.5 MCG/ACT inhaler Inhale 2 puffs into the lungs daily as needed (wheezing or shortness of breath).     . ferrous [id number] C-folic acid (TRINSICON / FOLTRIN) capsule Take 1 capsule by mouth 3 (three) times daily after meals. 90 capsule 2  . furosemide (LASIX) 20 MG tablet Take 20 mg daily by mouth.     Marland Kitchen lisinopril (PRINIVIL,ZESTRIL) 20 MG tablet Take 20 mg by mouth daily.    Marland Kitchen omeprazole (PRILOSEC) 20 MG capsule Take 1 capsule (20 mg total) by mouth 2 (two) times daily before a meal. 60 capsule 1  . potassium chloride (MICRO-K) 10 MEQ CR capsule Take 10 mEq by mouth daily.    . propranolol (INDERAL) 60 MG tablet Take 1 tablet (60 mg total) by mouth daily. 30 tablet 1  . rOPINIRole (REQUIP) 0.5 MG tablet Take 0.5 mg by mouth at bedtime as needed  (restless leg syndrome).     . simvastatin (ZOCOR) 20 MG tablet Take 20 mg by mouth daily.     . SUMAtriptan (IMITREX) 6 MG/0.5ML SOLN injection Inject 6 mg into the skin every 2 (two) hours as needed for migraine or headache. May repeat in 2 hours if headache persists or recurs.    Marland Kitchen tiotropium (SPIRIVA) 18 MCG inhalation capsule Place 18 mcg into inhaler and inhale daily.    . diclofenac Sodium (VOLTAREN) 1 % GEL Apply 2 g topically daily as needed.     No current facility-administered medications for this visit.    OBJECTIVE: Vitals:   06/01/20 1117  BP: 124/85  Pulse: 83  Resp: 18  Temp: 98.3 F (36.8 C)  SpO2: 100%     Body mass index is 33.53 kg/m.    ECOG FS:0 - Asymptomatic  General: Well-developed, well-nourished, no acute distress. Eyes: Pink conjunctiva, anicteric sclera. HEENT: Normocephalic, moist mucous membranes. Lungs: No audible wheezing or coughing. Heart: Regular rate and  rhythm. Abdomen: Soft, nontender, no obvious distention. Musculoskeletal: No edema, cyanosis, or clubbing. Neuro: Alert, answering all questions appropriately. Cranial nerves grossly intact. Skin: No rashes or petechiae noted. Psych: Normal affect. Lymphatics: No cervical, calvicular, axillary or inguinal LAD.   LAB RESULTS:  Lab Results  Component Value Date   NA 138 05/06/2020   K 4.3 05/06/2020   CL 106 05/06/2020   CO2 23 05/06/2020   GLUCOSE 108 (H) 05/06/2020   BUN 10 05/06/2020   CREATININE 0.98 05/06/2020   CALCIUM 8.4 (L) 05/06/2020   PROT 6.2 (L) 05/06/2020   ALBUMIN 3.7 05/06/2020   AST 19 05/06/2020   ALT 14 05/06/2020   ALKPHOS 30 (L) 05/06/2020   BILITOT 0.3 05/06/2020   GFRNONAA >60 05/06/2020   GFRAA >60 02/16/2018    Lab Results  Component Value Date   WBC 6.0 06/01/2020   NEUTROABS 3.7 06/01/2020   HGB 11.2 (L) 06/01/2020   HCT 36.8 06/01/2020   MCV 74.0 (L) 06/01/2020   PLT 400 06/01/2020   Lab Results  Component Value Date   IRON 12 (L)  05/06/2020   TIBC 493 (H) 05/06/2020   IRONPCTSAT 2 (L) 05/06/2020   Lab Results  Component Value Date   FERRITIN 2 (L) 05/06/2020     STUDIES: DG Abd 1 View - KUB  Result Date: 05/07/2020 CLINICAL DATA:  Abdominal pain after endoscopy EXAM: ABDOMEN - 1 VIEW COMPARISON:  None. FINDINGS: The lung bases are normal. No free air, portal venous gas, or pneumatosis seen on limited supine imaging. If there is concern for free air, an upright or decubitus film should be obtained. Air-filled loops of large and small bowel are identified. An ileus is not excluded. No evidence of obstruction. No other acute abnormalities. IMPRESSION: Air-filled loops of large and small bowel could be due to the recent endoscopy. Ileus not excluded. No obstruction noted. No free air identified. If there is concern however an upright or decubitus film should be obtained. Electronically Signed   By: Dorise Bullion III M.D   On: 05/07/2020 14:54    ASSESSMENT: Iron deficiency anemia.  PLAN:    1. Iron deficiency anemia: Nearly resolved.  Iron stores are pending at time of dictation.  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  These were treated with argon plasma coagulation.  No intervention is needed at this time.  Return to clinic in 3 months with repeat laboratory, further evaluation, and consideration of treatment if needed.    I spent a total of 20 minutes reviewing chart data, face-to-face evaluation with the patient, counseling and coordination of care as detailed above.  Patient expressed understanding and was in agreement with this plan. She also understands that She can call clinic at any time with any questions, concerns, or complaints.    Lloyd Huger, MD   06/01/2020 12:47 PM

## 2020-05-31 ENCOUNTER — Other Ambulatory Visit: Payer: Self-pay | Admitting: *Deleted

## 2020-05-31 DIAGNOSIS — D508 Other iron deficiency anemias: Secondary | ICD-10-CM

## 2020-06-01 ENCOUNTER — Other Ambulatory Visit: Payer: Self-pay

## 2020-06-01 ENCOUNTER — Inpatient Hospital Stay (HOSPITAL_BASED_OUTPATIENT_CLINIC_OR_DEPARTMENT_OTHER): Payer: Medicare HMO | Admitting: Oncology

## 2020-06-01 ENCOUNTER — Encounter: Payer: Self-pay | Admitting: Oncology

## 2020-06-01 ENCOUNTER — Inpatient Hospital Stay: Payer: Medicare HMO | Attending: Oncology

## 2020-06-01 VITALS — BP 124/85 | HR 83 | Temp 98.3°F | Resp 18 | Wt 195.3 lb

## 2020-06-01 DIAGNOSIS — D509 Iron deficiency anemia, unspecified: Secondary | ICD-10-CM | POA: Diagnosis present

## 2020-06-01 DIAGNOSIS — Z79899 Other long term (current) drug therapy: Secondary | ICD-10-CM | POA: Diagnosis not present

## 2020-06-01 DIAGNOSIS — D508 Other iron deficiency anemias: Secondary | ICD-10-CM | POA: Diagnosis not present

## 2020-06-01 LAB — IRON AND TIBC
Iron: 52 ug/dL (ref 28–170)
Saturation Ratios: 12 % (ref 10.4–31.8)
TIBC: 424 ug/dL (ref 250–450)
UIBC: 372 ug/dL

## 2020-06-01 LAB — SAMPLE TO BLOOD BANK

## 2020-06-01 LAB — CBC WITH DIFFERENTIAL/PLATELET
Abs Immature Granulocytes: 0.01 10*3/uL (ref 0.00–0.07)
Basophils Absolute: 0 10*3/uL (ref 0.0–0.1)
Basophils Relative: 1 %
Eosinophils Absolute: 0.1 10*3/uL (ref 0.0–0.5)
Eosinophils Relative: 1 %
HCT: 36.8 % (ref 36.0–46.0)
Hemoglobin: 11.2 g/dL — ABNORMAL LOW (ref 12.0–15.0)
Immature Granulocytes: 0 %
Lymphocytes Relative: 26 %
Lymphs Abs: 1.6 10*3/uL (ref 0.7–4.0)
MCH: 22.5 pg — ABNORMAL LOW (ref 26.0–34.0)
MCHC: 30.4 g/dL (ref 30.0–36.0)
MCV: 74 fL — ABNORMAL LOW (ref 80.0–100.0)
Monocytes Absolute: 0.6 10*3/uL (ref 0.1–1.0)
Monocytes Relative: 9 %
Neutro Abs: 3.7 10*3/uL (ref 1.7–7.7)
Neutrophils Relative %: 63 %
Platelets: 400 10*3/uL (ref 150–400)
RBC: 4.97 MIL/uL (ref 3.87–5.11)
Smear Review: NORMAL
WBC: 6 10*3/uL (ref 4.0–10.5)
nRBC: 0 % (ref 0.0–0.2)

## 2020-06-01 LAB — FERRITIN: Ferritin: 23 ng/mL (ref 11–307)

## 2020-06-22 ENCOUNTER — Ambulatory Visit: Payer: Medicare HMO | Admitting: Gastroenterology

## 2020-07-29 ENCOUNTER — Other Ambulatory Visit: Payer: Self-pay

## 2020-07-29 ENCOUNTER — Encounter: Payer: Self-pay | Admitting: Gastroenterology

## 2020-07-29 ENCOUNTER — Ambulatory Visit (INDEPENDENT_AMBULATORY_CARE_PROVIDER_SITE_OTHER): Payer: Medicare HMO | Admitting: Gastroenterology

## 2020-07-29 VITALS — BP 179/90 | HR 106 | Wt 203.2 lb

## 2020-07-29 DIAGNOSIS — D509 Iron deficiency anemia, unspecified: Secondary | ICD-10-CM

## 2020-07-29 DIAGNOSIS — K5900 Constipation, unspecified: Secondary | ICD-10-CM

## 2020-07-30 NOTE — Progress Notes (Signed)
Vonda Antigua, MD [address]  [place]  [place], [place] [address]  Main: [phone]  Fax: [phone]   Primary Care Physician: Letta Median, MD   Chief Complaint  Patient presents with  . Office visit    F/u Anemia    HPI: Hailey Farrell is a 70 y.o. 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  Patient receiving IV iron replacement with hematology with good response.  Last hemoglobin 11.2 in December 2021 and repeat planned to be in March, in the next few weeks by her hematologist.  The patient denies abdominal or flank pain, anorexia, nausea or vomiting, dysphagia, black or bloody stools or weight loss.  She does report taking MiraLAX about twice a week when she feels constipated.  Sometimes does not have a bowel movement for 3 to 4 days.  No blood in stool.  However, after taking the MiraLAX she will have 2-3 loose stools sometimes.    Current Outpatient Medications  Medication Sig Dispense Refill  . albuterol (VENTOLIN HFA) 108 (90 Base) MCG/ACT inhaler Inhale 2 puffs into the lungs every 6 (six) hours as needed for wheezing.    Marland Kitchen amitriptyline (ELAVIL) 25 MG tablet Take 25 mg by mouth at bedtime.    . budesonide-formoterol (SYMBICORT) 160-4.5 MCG/ACT inhaler Inhale 2 puffs into the lungs daily as needed (wheezing or shortness of breath).     . diclofenac Sodium (VOLTAREN) 1 % GEL Apply 2 g topically daily as needed.    . ferrous [id number] C-folic acid (TRINSICON / FOLTRIN) capsule Take 1 capsule by mouth 3 (three) times daily after meals. 90 capsule 2  . furosemide (LASIX) 20 MG tablet Take 20 mg daily by mouth.     Marland Kitchen lisinopril (PRINIVIL,ZESTRIL) 20 MG tablet Take 20 mg by mouth daily.    Marland Kitchen omeprazole (PRILOSEC) 20 MG capsule Take 1 capsule (20 mg total) by mouth 2 (two) times daily before a meal. 60 capsule 1  . potassium chloride (MICRO-K) 10 MEQ CR capsule Take 10 mEq by mouth daily.    . propranolol  (INDERAL) 60 MG tablet Take 1 tablet (60 mg total) by mouth daily. 30 tablet 1  . rOPINIRole (REQUIP) 0.5 MG tablet Take 0.5 mg by mouth at bedtime as needed (restless leg syndrome).     . simvastatin (ZOCOR) 20 MG tablet Take 20 mg by mouth daily.     . SUMAtriptan (IMITREX) 6 MG/0.5ML SOLN injection Inject 6 mg into the skin every 2 (two) hours as needed for migraine or headache. May repeat in 2 hours if headache persists or recurs.    Marland Kitchen tiotropium (SPIRIVA) 18 MCG inhalation capsule Place 18 mcg into inhaler and inhale daily.     No current facility-administered medications for this visit.    Allergies as of 07/29/2020 - Review Complete 07/29/2020  Allergen Reaction Noted  . Latex Rash 02/03/2015    ROS:  General: Negative for anorexia, weight loss, fever, chills, fatigue, weakness. ENT: Negative for hoarseness, difficulty swallowing , nasal congestion. CV: Negative for chest pain, angina, palpitations, dyspnea on exertion, peripheral edema.  Respiratory: Negative for dyspnea at rest, dyspnea on exertion, cough, sputum, wheezing.  GI: See history of present illness. GU:  Negative for dysuria, hematuria, urinary incontinence, urinary frequency, nocturnal urination.  Endo: Negative for unusual weight change.    Physical Examination:   BP (!) 179/90 (BP Location: Right Arm, Patient Position: Sitting, Cuff Size: Large)   Pulse (!) 106  Wt 203 lb 3.2 oz (92.2 kg)   BMI 34.88 kg/m   General: Well-nourished, well-developed in no acute distress.  Eyes: No icterus. Conjunctivae pink. Mouth: Oropharyngeal mucosa moist and pink , no lesions erythema or exudate. Neck: Supple, Trachea midline Abdomen: Bowel sounds are normal, nontender, nondistended, no hepatosplenomegaly or masses, no abdominal bruits or hernia , no rebound or guarding.   Extremities: No lower extremity edema. No clubbing or deformities. Neuro: Alert and oriented x 3.  Grossly intact. Skin: Warm and dry, no jaundice.    Psych: Alert and cooperative, normal mood and affect.   Labs: CMP     Component Value Date/Time   NA 138 05/06/2020 0537   K 4.3 05/06/2020 0537   CL 106 05/06/2020 0537   CO2 23 05/06/2020 0537   GLUCOSE 108 (H) 05/06/2020 0537   BUN 10 05/06/2020 0537   CREATININE 0.98 05/06/2020 0537   CALCIUM 8.4 (L) 05/06/2020 0537   PROT 6.2 (L) 05/06/2020 0309   ALBUMIN 3.7 05/06/2020 0309   AST 19 05/06/2020 0309   ALT 14 05/06/2020 0309   ALKPHOS 30 (L) 05/06/2020 0309   BILITOT 0.3 05/06/2020 0309   GFRNONAA >60 05/06/2020 0537   GFRAA >60 02/16/2018 1523   Lab Results  Component Value Date   WBC 6.0 06/01/2020   HGB 11.2 (L) 06/01/2020   HCT 36.8 06/01/2020   MCV 74.0 (L) 06/01/2020   PLT 400 06/01/2020    Imaging Studies: No results found.  Assessment and Plan:   Hailey Farrell is a 70 y.o. 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  Appearance of the duodenal bulb may be due to small superficial ulcers versus pancreatic rest.  Patient is not having any symptoms from this such as anemia, abdominal pain, diarrhea or any other symptoms of concern.  Therefore, no indication for upper endoscopy at this time and would put patient through risks of procedure without any benefits of any changes in management given patient is asymptomatic.  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  Gastric mapping biopsies can also be obtained on next upper endoscopy.  July 2022 would be 3 years since her last upper endoscopy  Have advised  her to stop MiraLAX and start taking Metamucil every day or every other day and titrate as necessary.  Titrate instructions discussed, to avoid constipation.  If she is not satisfied with her bowel movements and having more than 1-2 soft bowel movements a day with this, I have asked her to decrease the dose.  However, if she is not responding well to the medication she can stop this medication and call us back and she verbalized understanding  Dr Vonda Antigua

## 2020-08-24 ENCOUNTER — Other Ambulatory Visit: Payer: Self-pay | Admitting: Oncology

## 2020-08-27 ENCOUNTER — Inpatient Hospital Stay: Payer: Medicare HMO | Attending: Oncology

## 2020-08-27 DIAGNOSIS — F17210 Nicotine dependence, cigarettes, uncomplicated: Secondary | ICD-10-CM | POA: Diagnosis not present

## 2020-08-27 DIAGNOSIS — Z8249 Family history of ischemic heart disease and other diseases of the circulatory system: Secondary | ICD-10-CM | POA: Diagnosis not present

## 2020-08-27 DIAGNOSIS — D508 Other iron deficiency anemias: Secondary | ICD-10-CM

## 2020-08-27 DIAGNOSIS — Z7951 Long term (current) use of inhaled steroids: Secondary | ICD-10-CM | POA: Insufficient documentation

## 2020-08-27 DIAGNOSIS — D500 Iron deficiency anemia secondary to blood loss (chronic): Secondary | ICD-10-CM | POA: Diagnosis present

## 2020-08-27 DIAGNOSIS — Z79899 Other long term (current) drug therapy: Secondary | ICD-10-CM | POA: Insufficient documentation

## 2020-08-27 DIAGNOSIS — K922 Gastrointestinal hemorrhage, unspecified: Secondary | ICD-10-CM | POA: Diagnosis present

## 2020-08-27 DIAGNOSIS — I10 Essential (primary) hypertension: Secondary | ICD-10-CM | POA: Insufficient documentation

## 2020-08-27 LAB — CBC WITH DIFFERENTIAL/PLATELET
Abs Immature Granulocytes: 0.01 10*3/uL (ref 0.00–0.07)
Basophils Absolute: 0 10*3/uL (ref 0.0–0.1)
Basophils Relative: 0 %
Eosinophils Absolute: 0 10*3/uL (ref 0.0–0.5)
Eosinophils Relative: 1 %
HCT: 34.5 % — ABNORMAL LOW (ref 36.0–46.0)
Hemoglobin: 10.6 g/dL — ABNORMAL LOW (ref 12.0–15.0)
Immature Granulocytes: 0 %
Lymphocytes Relative: 37 %
Lymphs Abs: 1.7 10*3/uL (ref 0.7–4.0)
MCH: 24.1 pg — ABNORMAL LOW (ref 26.0–34.0)
MCHC: 30.7 g/dL (ref 30.0–36.0)
MCV: 78.4 fL — ABNORMAL LOW (ref 80.0–100.0)
Monocytes Absolute: 0.4 10*3/uL (ref 0.1–1.0)
Monocytes Relative: 9 %
Neutro Abs: 2.5 10*3/uL (ref 1.7–7.7)
Neutrophils Relative %: 53 %
Platelets: 259 10*3/uL (ref 150–400)
RBC: 4.4 MIL/uL (ref 3.87–5.11)
RDW: 19 % — ABNORMAL HIGH (ref 11.5–15.5)
WBC: 4.7 10*3/uL (ref 4.0–10.5)
nRBC: 0 % (ref 0.0–0.2)

## 2020-08-27 LAB — IRON AND TIBC
Iron: 30 ug/dL (ref 28–170)
Saturation Ratios: 7 % — ABNORMAL LOW (ref 10.4–31.8)
TIBC: 438 ug/dL (ref 250–450)
UIBC: 408 ug/dL

## 2020-08-27 LAB — FERRITIN: Ferritin: 6 ng/mL — ABNORMAL LOW (ref 11–307)

## 2020-08-30 ENCOUNTER — Other Ambulatory Visit: Payer: Self-pay

## 2020-08-30 ENCOUNTER — Inpatient Hospital Stay: Payer: Medicare HMO

## 2020-08-30 ENCOUNTER — Encounter: Payer: Self-pay | Admitting: Oncology

## 2020-08-30 ENCOUNTER — Inpatient Hospital Stay (HOSPITAL_BASED_OUTPATIENT_CLINIC_OR_DEPARTMENT_OTHER): Payer: Medicare HMO | Admitting: Oncology

## 2020-08-30 VITALS — BP 115/74 | HR 102 | Temp 97.7°F | Resp 20 | Wt 201.8 lb

## 2020-08-30 DIAGNOSIS — D500 Iron deficiency anemia secondary to blood loss (chronic): Secondary | ICD-10-CM | POA: Diagnosis not present

## 2020-08-30 DIAGNOSIS — D508 Other iron deficiency anemias: Secondary | ICD-10-CM | POA: Diagnosis not present

## 2020-08-30 NOTE — Progress Notes (Signed)
[place]  [phone] [phone]) [phone]  ID: Hailey Farrell OB: 01-Jun-1951  MR#: [id number]  ULA#:[id number]  Patient Care Team: Letta Median, MD as PCP - General (Family Medicine)  CHIEF COMPLAINT: Iron deficiency anemia  INTERVAL HISTORY: Patient returns to clinic today for 72-month follow-up.  She was last seen in clinic on 06/01/2020.  Patient was initially hospitalized from 05/06/20-05/08/2020 for acute onset dizziness and lightheadedness with presyncopal episode.  Lab work showed a hemoglobin of 3.4.  She received 3 units of packed red blood cells.  Iron studies were found to be very low.  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  They were treated with argon plasma coagulation.  She received 1 dose of iron on 05/07/2020.  Today, she continues to feel well.  She denies any shortness of breath, dizziness or chest pain.  She denies any bleeding per rectum, hemoptysis or vaginal bleeding. She does not complain of any further weakness or fatigue.  She has no neurologic complaints.  She denies any recent fevers or illnesses.  She has a good appetite and denies weight loss.    She denies any chest pain, shortness of breath, cough, or hemoptysis.  She denies any nausea, vomiting, constipation, or diarrhea.  She has no urinary complaints.    Patient offers no specific complaints today.  REVIEW OF SYSTEMS:   Review of Systems  Constitutional: Negative.  Negative for fever, malaise/fatigue and weight loss.  Respiratory: Negative.  Negative for cough and shortness of breath.   Cardiovascular: Negative.  Negative for chest pain and leg swelling.  Gastrointestinal: Negative.  Negative for abdominal pain, blood in stool and melena.  Genitourinary: Negative.  Negative for hematuria.  Musculoskeletal: Negative.  Negative for back pain.  Skin: Negative.  Negative for rash.   Neurological: Negative.  Negative for dizziness, focal weakness, weakness and headaches.  Psychiatric/Behavioral: Negative.  The patient is not nervous/anxious.     As per HPI. Otherwise, a complete review of systems is negative.  PAST MEDICAL HISTORY: Past Medical History:  Diagnosis Date  . Anemia   . Arthritis   . Breast cancer ([place]) 1990   Right  . COPD (chronic obstructive pulmonary disease) ([place])   . Dyspnea    DOE  . Elevated lipids   . Hypertension   . Lower extremity edema   . Migraines   . Migraines   . Personal history of radiation therapy 1990   Breast  . Restless leg syndrome   . Rotator cuff injury     PAST SURGICAL HISTORY: Past Surgical History:  Procedure Laterality Date  . ABDOMINAL HYSTERECTOMY    . BACK SURGERY  08/2017   CERVICAL FUSION  . BREAST BIOPSY Right 1990   LUMPECTOMY.  took lymph nodes  . COLONOSCOPY WITH PROPOFOL N/A 07/20/2017   Procedure: COLONOSCOPY WITH PROPOFOL;  Surgeon: Virgel Manifold, MD;  Location: [facility] ENDOSCOPY;  Service: Endoscopy;  Laterality: N/A;  . ENTEROSCOPY N/A 08/08/2017   Procedure: ENTEROSCOPY;  Surgeon: Lin Landsman, MD;  Location: [facility] ENDOSCOPY;  Service: Gastroenterology;  Laterality: N/A;  . ENTEROSCOPY N/A 05/07/2020   Procedure: ENTEROSCOPY with Adult colonoscope;  Surgeon: Virgel Manifold, MD;  Location: [facility] ENDOSCOPY;  Service: Endoscopy;  Laterality: N/A;  . ESOPHAGOGASTRODUODENOSCOPY Left 04/13/2017   Procedure: ESOPHAGOGASTRODUODENOSCOPY (EGD);  Surgeon: Virgel Manifold, MD;  Location: [facility] ENDOSCOPY;  Service: Endoscopy;  Laterality: Left;  . ESOPHAGOGASTRODUODENOSCOPY (EGD) WITH PROPOFOL  N/A 07/20/2017   Procedure: ESOPHAGOGASTRODUODENOSCOPY (EGD) WITH PROPOFOL;  Surgeon: Virgel Manifold, MD;  Location: [facility] ENDOSCOPY;  Service: Endoscopy;  Laterality: N/A;  . ESOPHAGOGASTRODUODENOSCOPY (EGD) WITH PROPOFOL N/A 12/12/2017   Procedure: ESOPHAGOGASTRODUODENOSCOPY (EGD) WITH PROPOFOL;   Surgeon: Virgel Manifold, MD;  Location: [facility] ENDOSCOPY;  Service: Endoscopy;  Laterality: N/A;  . MEDIAL PARTIAL KNEE REPLACEMENT Left 1990   torn ligament. no knee replacement at that time  . PARTIAL HYSTERECTOMY    . SHOULDER ARTHROSCOPY WITH OPEN ROTATOR CUFF REPAIR Left 03/01/2016   Procedure: SHOULDER ARTHROSCOPY WITH OPEN ROTATOR CUFF REPAIR;  Surgeon: Thornton Park, MD;  Location: [facility] ORS;  Service: Orthopedics;  Laterality: Left;  . TOTAL KNEE ARTHROPLASTY Left 02/14/2018   Procedure: TOTAL KNEE ARTHROPLASTY;  Surgeon: Thornton Park, MD;  Location: [facility] ORS;  Service: Orthopedics;  Laterality: Left;    FAMILY HISTORY: Family History  Problem Relation Age of Onset  . CAD Mother   . CAD Sister   . Breast cancer Neg Hx     ADVANCED DIRECTIVES (Y/N):  N  HEALTH MAINTENANCE: Social History   Tobacco Use  . Smoking status: Current Some Day Smoker    Packs/day: 0.10    Years: 50.00    Pack years: 5.00    Types: Cigarettes    Last attempt to quit: 03/13/2016    Years since quitting: 4.4  . Smokeless tobacco: Never Used  Vaping Use  . Vaping Use: Never used  Substance Use Topics  . Alcohol use: No  . Drug use: No     Colonoscopy:  PAP:  Bone density:  Lipid panel:  Allergies  Allergen Reactions  . Latex Rash    Welts over body    Current Outpatient Medications  Medication Sig Dispense Refill  . albuterol (VENTOLIN HFA) 108 (90 Base) MCG/ACT inhaler Inhale 2 puffs into the lungs every 6 (six) hours as needed for wheezing.    Marland Kitchen amitriptyline (ELAVIL) 25 MG tablet Take 25 mg by mouth at bedtime.    . budesonide-formoterol (SYMBICORT) 160-4.5 MCG/ACT inhaler Inhale 2 puffs into the lungs daily as needed (wheezing or shortness of breath).     . diclofenac Sodium (VOLTAREN) 1 % GEL Apply 2 g topically daily as needed.    . ferrous [id number] C-folic acid (TRINSICON / FOLTRIN) capsule Take 1 capsule by mouth 3 (three) times daily after meals. 90  capsule 2  . furosemide (LASIX) 20 MG tablet Take 20 mg daily by mouth.     Marland Kitchen lisinopril (PRINIVIL,ZESTRIL) 20 MG tablet Take 20 mg by mouth daily.    . potassium chloride (MICRO-K) 10 MEQ CR capsule Take 10 mEq by mouth daily.    Marland Kitchen rOPINIRole (REQUIP) 0.5 MG tablet Take 0.5 mg by mouth at bedtime as needed (restless leg syndrome).     . simvastatin (ZOCOR) 20 MG tablet Take 20 mg by mouth daily.     . SUMAtriptan (IMITREX) 6 MG/0.5ML SOLN injection Inject 6 mg into the skin every 2 (two) hours as needed for migraine or headache. May repeat in 2 hours if headache persists or recurs.    Marland Kitchen tiotropium (SPIRIVA) 18 MCG inhalation capsule Place 18 mcg into inhaler and inhale daily.     No current facility-administered medications for this visit.    OBJECTIVE: Vitals:   08/30/20 1358  BP: 115/74  Pulse: (!) 102  Resp: 20  Temp: 97.7 F (36.5 C)     Body mass index is 34.64 kg/m.    ECOG  FS:0 - Asymptomatic  Physical Exam Constitutional:      Appearance: Normal appearance.  HENT:     Head: Normocephalic and atraumatic.  Eyes:     Pupils: Pupils are equal, round, and reactive to light.  Cardiovascular:     Rate and Rhythm: Normal rate and regular rhythm.     Heart sounds: Normal heart sounds. No murmur heard.   Pulmonary:     Effort: Pulmonary effort is normal.     Breath sounds: Normal breath sounds. No wheezing.  Abdominal:     General: Bowel sounds are normal. There is no distension.     Palpations: Abdomen is soft.     Tenderness: There is no abdominal tenderness.  Musculoskeletal:        General: Normal range of motion.     Cervical back: Normal range of motion.  Skin:    General: Skin is warm and dry.     Findings: No rash.  Neurological:     Mental Status: She is alert and oriented to person, place, and time.  Psychiatric:        Judgment: Judgment normal.      LAB RESULTS:  Lab Results  Component Value Date   NA 138 05/06/2020   K 4.3 05/06/2020   CL 106  05/06/2020   CO2 23 05/06/2020   GLUCOSE 108 (H) 05/06/2020   BUN 10 05/06/2020   CREATININE 0.98 05/06/2020   CALCIUM 8.4 (L) 05/06/2020   PROT 6.2 (L) 05/06/2020   ALBUMIN 3.7 05/06/2020   AST 19 05/06/2020   ALT 14 05/06/2020   ALKPHOS 30 (L) 05/06/2020   BILITOT 0.3 05/06/2020   GFRNONAA >60 05/06/2020   GFRAA >60 02/16/2018    Lab Results  Component Value Date   WBC 4.7 08/27/2020   NEUTROABS 2.5 08/27/2020   HGB 10.6 (L) 08/27/2020   HCT 34.5 (L) 08/27/2020   MCV 78.4 (L) 08/27/2020   PLT 259 08/27/2020   Lab Results  Component Value Date   IRON 30 08/27/2020   TIBC 438 08/27/2020   IRONPCTSAT 7 (L) 08/27/2020   Lab Results  Component Value Date   FERRITIN 6 (L) 08/27/2020     STUDIES: No results found.  ASSESSMENT: Iron deficiency anemia.  PLAN:    1. Iron deficiency anemia:  -Secondary to slow GI bleed. 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  They were treated with argon plasma coagulation. -She received 3 units of packed red blood cells while hospitalized in December 2021. -At discharge her hemoglobin had improved from 3.7-7.9. -She received 1 dose of IV Feraheme on 05/07/2020. -Labs from 08/27/2020 show a hemoglobin of 10.6, ferritin of 6 and iron saturation of 7%.  MCV is 78.4. -Recommend 5 doses of IV Venofer given decline. 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 -Patient has been tolerant of oral iron in the past and would like to restart. -Recommend ferrous sulfate or ferrous gluconate 325 mg tablets twice daily.  I explained that if she is unable to tolerate, she most likely will need additional IV iron at her next visit. -RTC in 6 weeks with repeat labs, MD assessment and possible IV iron.  Disposition: -RTC in 6 weeks with repeat lab work, MD assessment and possible IV iron.  Greater than 50% was spent in counseling and  coordination of care with this patient including but not limited to discussion of the relevant topics above (  See A&P) including, but not limited to diagnosis and management of acute and chronic medical conditions.   Patient expressed understanding and was in agreement with this plan. She also understands that She can call clinic at any time with any questions, concerns, or complaints.    Jacquelin Hawking, NP   08/30/2020 2:23 PM

## 2020-08-30 NOTE — Progress Notes (Signed)
Patient denies any concerns today.  

## 2020-10-11 ENCOUNTER — Inpatient Hospital Stay: Payer: Medicare HMO | Attending: Oncology

## 2020-10-11 ENCOUNTER — Inpatient Hospital Stay: Payer: Medicare HMO

## 2020-10-11 ENCOUNTER — Encounter: Payer: Self-pay | Admitting: Oncology

## 2020-10-11 ENCOUNTER — Inpatient Hospital Stay (HOSPITAL_BASED_OUTPATIENT_CLINIC_OR_DEPARTMENT_OTHER): Payer: Medicare HMO | Admitting: Oncology

## 2020-10-11 VITALS — BP 137/90 | HR 91 | Temp 97.6°F | Resp 20 | Wt 199.0 lb

## 2020-10-11 DIAGNOSIS — F17210 Nicotine dependence, cigarettes, uncomplicated: Secondary | ICD-10-CM | POA: Insufficient documentation

## 2020-10-11 DIAGNOSIS — K922 Gastrointestinal hemorrhage, unspecified: Secondary | ICD-10-CM | POA: Diagnosis not present

## 2020-10-11 DIAGNOSIS — D500 Iron deficiency anemia secondary to blood loss (chronic): Secondary | ICD-10-CM | POA: Insufficient documentation

## 2020-10-11 DIAGNOSIS — D508 Other iron deficiency anemias: Secondary | ICD-10-CM

## 2020-10-11 LAB — FERRITIN: Ferritin: 39 ng/mL (ref 11–307)

## 2020-10-11 LAB — CBC WITH DIFFERENTIAL/PLATELET
Abs Immature Granulocytes: 0 10*3/uL (ref 0.00–0.07)
Basophils Absolute: 0 10*3/uL (ref 0.0–0.1)
Basophils Relative: 1 %
Eosinophils Absolute: 0.1 10*3/uL (ref 0.0–0.5)
Eosinophils Relative: 1 %
HCT: 37.8 % (ref 36.0–46.0)
Hemoglobin: 11.9 g/dL — ABNORMAL LOW (ref 12.0–15.0)
Immature Granulocytes: 0 %
Lymphocytes Relative: 32 %
Lymphs Abs: 1.5 10*3/uL (ref 0.7–4.0)
MCH: 25.9 pg — ABNORMAL LOW (ref 26.0–34.0)
MCHC: 31.5 g/dL (ref 30.0–36.0)
MCV: 82.2 fL (ref 80.0–100.0)
Monocytes Absolute: 0.5 10*3/uL (ref 0.1–1.0)
Monocytes Relative: 10 %
Neutro Abs: 2.7 10*3/uL (ref 1.7–7.7)
Neutrophils Relative %: 56 %
Platelets: 267 10*3/uL (ref 150–400)
RBC: 4.6 MIL/uL (ref 3.87–5.11)
RDW: 18.3 % — ABNORMAL HIGH (ref 11.5–15.5)
WBC: 4.8 10*3/uL (ref 4.0–10.5)
nRBC: 0 % (ref 0.0–0.2)

## 2020-10-11 LAB — IRON AND TIBC
Iron: 202 ug/dL — ABNORMAL HIGH (ref 28–170)
Saturation Ratios: 53 % — ABNORMAL HIGH (ref 10.4–31.8)
TIBC: 385 ug/dL (ref 250–450)
UIBC: 183 ug/dL

## 2020-10-11 NOTE — Progress Notes (Signed)
[place]  Telephone:(336) [phone] Fax:(336) [phone]  ID: Hailey Farrell OB: 26-Jan-1951  MR#: [id number]  TFT#:[id number]  Patient Care Team: Letta Median, MD as PCP - General (Family Medicine)  CHIEF COMPLAINT: Iron deficiency anemia  INTERVAL HISTORY: Patient returns to clinic today for 4-month follow-up.  She was last seen in clinic on 06/01/2020.  Patient was initially hospitalized from 05/06/20-05/08/2020 for acute onset dizziness and lightheadedness with presyncopal episode.  Lab work showed a hemoglobin of 3.4.  She received 3 units of packed red blood cells.  Iron studies were found to be very low.  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  They were treated with argon plasma coagulation.  She received 1 dose of iron on 05/07/2020.  Today, she continues to feel well.  She has been compliant with her daily vitamins as well as iron supplements.  She denies any distressing GI symptoms secondary to iron tablets.  She denies any bleeding per rectum, hemoptysis or vaginal bleeding.  Denies any weakness or fatigue.  Has chronic right knee problems and needs a knee replacement at some point.  She continues to put this off due to [place].  Denies any shortness of breath, dizziness or chest pain.   REVIEW OF SYSTEMS:   Review of Systems  Constitutional: Negative.  Negative for fever, malaise/fatigue and weight loss.  Respiratory: Negative.  Negative for cough and shortness of breath.   Cardiovascular: Negative.  Negative for chest pain and leg swelling.  Gastrointestinal: Negative.  Negative for abdominal pain, blood in stool and melena.  Genitourinary: Negative.  Negative for hematuria.  Musculoskeletal: Negative.  Negative for back pain.  Skin: Negative.  Negative for rash.  Neurological: Negative.  Negative for dizziness, focal weakness, weakness and headaches.   Psychiatric/Behavioral: Negative.  The patient is not nervous/anxious.     As per HPI. Otherwise, a complete review of systems is negative.  PAST MEDICAL HISTORY: Past Medical History:  Diagnosis Date  . Anemia   . Arthritis   . Breast cancer ([place]) 1990   Right  . COPD (chronic obstructive pulmonary disease) ([place])   . Dyspnea    DOE  . Elevated lipids   . Hypertension   . Lower extremity edema   . Migraines   . Migraines   . Personal history of radiation therapy 1990   Breast  . Restless leg syndrome   . Rotator cuff injury     PAST SURGICAL HISTORY: Past Surgical History:  Procedure Laterality Date  . ABDOMINAL HYSTERECTOMY    . BACK SURGERY  08/2017   CERVICAL FUSION  . BREAST BIOPSY Right 1990   LUMPECTOMY.  took lymph nodes  . COLONOSCOPY WITH PROPOFOL N/A 07/20/2017   Procedure: COLONOSCOPY WITH PROPOFOL;  Surgeon: Virgel Manifold, MD;  Location: [facility] ENDOSCOPY;  Service: Endoscopy;  Laterality: N/A;  . ENTEROSCOPY N/A 08/08/2017   Procedure: ENTEROSCOPY;  Surgeon: Lin Landsman, MD;  Location[facility] ENDOSCOPY;  Service: Gastroenterology;  Laterality: N/A;  . ENTEROSCOPY N/A 05/07/2020   Procedure: ENTEROSCOPY with Adult colonoscope;  Surgeon: Virgel Manifold, MD;  Location: [facility] ENDOSCOPY;  Service: Endoscopy;  Laterality: N/A;  . ESOPHAGOGASTRODUODENOSCOPY Left 04/13/2017   Procedure: ESOPHAGOGASTRODUODENOSCOPY (EGD);  Surgeon: Virgel Manifold, MD;  Location[facility] ENDOSCOPY;  Service: Endoscopy;  Laterality: Left;  . ESOPHAGOGASTRODUODENOSCOPY (EGD) WITH PROPOFOL N/A 07/20/2017   Procedure: ESOPHAGOGASTRODUODENOSCOPY (EGD) WITH PROPOFOL;  Surgeon: Virgel Manifold, MD;  Location:  [place] ENDOSCOPY;  Service: Endoscopy;  Laterality: N/A;  . ESOPHAGOGASTRODUODENOSCOPY (EGD) WITH PROPOFOL N/A 12/12/2017   Procedure: ESOPHAGOGASTRODUODENOSCOPY (EGD) WITH PROPOFOL;  Surgeon: Virgel Manifold, MD;  Location: [facility] ENDOSCOPY;  Service: Endoscopy;   Laterality: N/A;  . MEDIAL PARTIAL KNEE REPLACEMENT Left 1990   torn ligament. no knee replacement at that time  . PARTIAL HYSTERECTOMY    . SHOULDER ARTHROSCOPY WITH OPEN ROTATOR CUFF REPAIR Left 03/01/2016   Procedure: SHOULDER ARTHROSCOPY WITH OPEN ROTATOR CUFF REPAIR;  Surgeon: Thornton Park, MD;  Location: [facility] ORS;  Service: Orthopedics;  Laterality: Left;  . TOTAL KNEE ARTHROPLASTY Left 02/14/2018   Procedure: TOTAL KNEE ARTHROPLASTY;  Surgeon: Thornton Park, MD;  Location: [facility] ORS;  Service: Orthopedics;  Laterality: Left;    FAMILY HISTORY: Family History  Problem Relation Age of Onset  . CAD Mother   . CAD Sister   . Breast cancer Neg Hx     ADVANCED DIRECTIVES (Y/N):  N  HEALTH MAINTENANCE: Social History   Tobacco Use  . Smoking status: Current Some Day Smoker    Packs/day: 0.10    Years: 50.00    Pack years: 5.00    Types: Cigarettes    Last attempt to quit: 03/13/2016    Years since quitting: 4.5  . Smokeless tobacco: Never Used  Vaping Use  . Vaping Use: Never used  Substance Use Topics  . Alcohol use: No  . Drug use: No     Colonoscopy:  PAP:  Bone density:  Lipid panel:  Allergies  Allergen Reactions  . Latex Rash    Welts over body    Current Outpatient Medications  Medication Sig Dispense Refill  . albuterol (VENTOLIN HFA) 108 (90 Base) MCG/ACT inhaler Inhale 2 puffs into the lungs every 6 (six) hours as needed for wheezing.    Marland Kitchen amitriptyline (ELAVIL) 25 MG tablet Take 25 mg by mouth at bedtime.    . budesonide-formoterol (SYMBICORT) 160-4.5 MCG/ACT inhaler Inhale 2 puffs into the lungs daily as needed (wheezing or shortness of breath).     . diclofenac Sodium (VOLTAREN) 1 % GEL Apply 2 g topically daily as needed.    . ferrous [id number] C-folic acid (TRINSICON / FOLTRIN) capsule Take 1 capsule by mouth 3 (three) times daily after meals. 90 capsule 2  . furosemide (LASIX) 20 MG tablet Take 20 mg daily by mouth.     Marland Kitchen  lisinopril (PRINIVIL,ZESTRIL) 20 MG tablet Take 20 mg by mouth daily.    . potassium chloride (MICRO-K) 10 MEQ CR capsule Take 10 mEq by mouth daily.    Marland Kitchen rOPINIRole (REQUIP) 0.5 MG tablet Take 0.5 mg by mouth at bedtime as needed (restless leg syndrome).     . simvastatin (ZOCOR) 20 MG tablet Take 20 mg by mouth daily.     . SUMAtriptan (IMITREX) 6 MG/0.5ML SOLN injection Inject 6 mg into the skin every 2 (two) hours as needed for migraine or headache. May repeat in 2 hours if headache persists or recurs.    Marland Kitchen tiotropium (SPIRIVA) 18 MCG inhalation capsule Place 18 mcg into inhaler and inhale daily.    . vitamin B-12 (CYANOCOBALAMIN) 1000 MCG tablet Take 1,000 mcg by mouth daily.     No current facility-administered medications for this visit.    OBJECTIVE: Vitals:   10/11/20 1319  BP: 137/90  Pulse: 91  Resp: 20  Temp: 97.6 F (36.4 C)     Body mass index is 34.16 kg/m.    ECOG FS:0 -  Asymptomatic  Physical Exam Constitutional:      Appearance: Normal appearance.  HENT:     Head: Normocephalic and atraumatic.  Eyes:     Pupils: Pupils are equal, round, and reactive to light.  Cardiovascular:     Rate and Rhythm: Normal rate and regular rhythm.     Heart sounds: Normal heart sounds. No murmur heard.   Pulmonary:     Effort: Pulmonary effort is normal.     Breath sounds: Normal breath sounds. No wheezing.  Abdominal:     General: Bowel sounds are normal. There is no distension.     Palpations: Abdomen is soft.     Tenderness: There is no abdominal tenderness.  Musculoskeletal:        General: Normal range of motion.     Cervical back: Normal range of motion.  Skin:    General: Skin is warm and dry.     Findings: No rash.  Neurological:     Mental Status: She is alert and oriented to person, place, and time.  Psychiatric:        Judgment: Judgment normal.      LAB RESULTS:  Lab Results  Component Value Date   NA 138 05/06/2020   K 4.3 05/06/2020   CL 106  05/06/2020   CO2 23 05/06/2020   GLUCOSE 108 (H) 05/06/2020   BUN 10 05/06/2020   CREATININE 0.98 05/06/2020   CALCIUM 8.4 (L) 05/06/2020   PROT 6.2 (L) 05/06/2020   ALBUMIN 3.7 05/06/2020   AST 19 05/06/2020   ALT 14 05/06/2020   ALKPHOS 30 (L) 05/06/2020   BILITOT 0.3 05/06/2020   GFRNONAA >60 05/06/2020   GFRAA >60 02/16/2018    Lab Results  Component Value Date   WBC 4.8 10/11/2020   NEUTROABS 2.7 10/11/2020   HGB 11.9 (L) 10/11/2020   HCT 37.8 10/11/2020   MCV 82.2 10/11/2020   PLT 267 10/11/2020   Lab Results  Component Value Date   IRON 30 08/27/2020   TIBC 438 08/27/2020   IRONPCTSAT 7 (L) 08/27/2020   Lab Results  Component Value Date   FERRITIN 6 (L) 08/27/2020     STUDIES: No results found.  ASSESSMENT: Iron deficiency anemia.  PLAN:    1. Iron deficiency anemia:  -Secondary to slow GI bleed. 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  They were treated with argon plasma coagulation. -She received 3 units of packed red blood cells while hospitalized in December 2021. -At discharge her hemoglobin had improved from 3.7-7.9. -She received 1 dose of IV Feraheme on 05/07/2020. -Labs from 08/27/2020 show a hemoglobin of 10.6, ferritin of 6 and iron saturation of 7%.  MCV is 78.4. -Recommend 5 doses of IV Venofer given decline but patient wanted to try oral iron instead.  - She has been compliant with 325 mg ferrous sulfate twice daily for the past 6 weeks.  Denies any GI symptoms. -Labs from 10/11/2020 show improvement of her hemoglobin from 10.6-11.9 today.  Iron levels are pending. -Recommend she continue oral iron supplements.   Disposition: -Continue oral iron supplements twice daily. -RTC in 12 weeks with repeat labs (CBC, Ferritin, iron), MD assessment and possible IV iron.  Greater than 50% was spent in counseling and coordination of care with this patient including but not limited to  discussion of the relevant topics above (See A&P) including, but not limited to diagnosis and management of acute and chronic medical conditions.  Patient expressed understanding and was in agreement with this plan. She also understands that She can call clinic at any time with any questions, concerns, or complaints.    Jacquelin Hawking, NP   10/11/2020 1:36 PM

## 2020-10-11 NOTE — Progress Notes (Signed)
Patient denies any concerns today.  

## 2020-12-01 ENCOUNTER — Encounter: Payer: Self-pay | Admitting: Gastroenterology

## 2020-12-01 ENCOUNTER — Ambulatory Visit (INDEPENDENT_AMBULATORY_CARE_PROVIDER_SITE_OTHER): Payer: Medicare HMO | Admitting: Gastroenterology

## 2020-12-01 ENCOUNTER — Other Ambulatory Visit: Payer: Self-pay

## 2020-12-01 VITALS — BP 125/84 | HR 103 | Temp 98.7°F | Wt 195.8 lb

## 2020-12-01 DIAGNOSIS — D500 Iron deficiency anemia secondary to blood loss (chronic): Secondary | ICD-10-CM | POA: Diagnosis not present

## 2020-12-01 DIAGNOSIS — K31A0 Gastric intestinal metaplasia, unspecified: Secondary | ICD-10-CM

## 2020-12-01 NOTE — Progress Notes (Signed)
Hailey Antigua, MD [address]  [place]  [place], [place] [address]  Main: [phone]  Fax: [phone]   Primary Care Physician: Letta Median, MD   Chief complaint: Anemia  HPI: Hailey Farrell is a 70 y.o. female here for follow-up of iron deficiency anemia. The patient denies abdominal or flank pain, anorexia, nausea or vomiting, dysphagia, change in bowel habits or black or bloody stools or weight loss.  Hematology notes personally reviewed and due to low ferritin of 6 and iron saturation of 7%, patient was recommended 5 doses of IV Venofer, but she chose to do oral iron instead.  They noted improvement in her labs on the oral regimen.  They recommended continuing the oral iron at this time.   ROS: All ROS reviewed and negative except as per HPI   Past Medical History:  Diagnosis Date  . Anemia   . Arthritis   . Breast cancer ([place]) 1990   Right  . COPD (chronic obstructive pulmonary disease) ([place])   . Dyspnea    DOE  . Elevated lipids   . Hypertension   . Lower extremity edema   . Migraines   . Migraines   . Personal history of radiation therapy 1990   Breast  . Restless leg syndrome   . Rotator cuff injury     Past Surgical History:  Procedure Laterality Date  . ABDOMINAL HYSTERECTOMY    . BACK SURGERY  08/2017   CERVICAL FUSION  . BREAST BIOPSY Right 1990   LUMPECTOMY.  took lymph nodes  . COLONOSCOPY WITH PROPOFOL N/A 07/20/2017   Procedure: COLONOSCOPY WITH PROPOFOL;  Surgeon: Virgel Manifold, MD;  Location: [facility] ENDOSCOPY;  Service: Endoscopy;  Laterality: N/A;  . ENTEROSCOPY N/A 08/08/2017   Procedure: ENTEROSCOPY;  Surgeon: Lin Landsman, MD;  Location: [facility] ENDOSCOPY;  Service: Gastroenterology;  Laterality: N/A;  . ENTEROSCOPY N/A 05/07/2020   Procedure: ENTEROSCOPY with Adult colonoscope;  Surgeon: Virgel Manifold, MD;  Location: [facility] ENDOSCOPY;  Service: Endoscopy;  Laterality: N/A;  . ESOPHAGOGASTRODUODENOSCOPY  Left 04/13/2017   Procedure: ESOPHAGOGASTRODUODENOSCOPY (EGD);  Surgeon: Virgel Manifold, MD;  Location: [facility] ENDOSCOPY;  Service: Endoscopy;  Laterality: Left;  . ESOPHAGOGASTRODUODENOSCOPY (EGD) WITH PROPOFOL N/A 07/20/2017   Procedure: ESOPHAGOGASTRODUODENOSCOPY (EGD) WITH PROPOFOL;  Surgeon: Virgel Manifold, MD;  Location: [facility] ENDOSCOPY;  Service: Endoscopy;  Laterality: N/A;  . ESOPHAGOGASTRODUODENOSCOPY (EGD) WITH PROPOFOL N/A 12/12/2017   Procedure: ESOPHAGOGASTRODUODENOSCOPY (EGD) WITH PROPOFOL;  Surgeon: Virgel Manifold, MD;  Location: [facility] ENDOSCOPY;  Service: Endoscopy;  Laterality: N/A;  . MEDIAL PARTIAL KNEE REPLACEMENT Left 1990   torn ligament. no knee replacement at that time  . PARTIAL HYSTERECTOMY    . SHOULDER ARTHROSCOPY WITH OPEN ROTATOR CUFF REPAIR Left 03/01/2016   Procedure: SHOULDER ARTHROSCOPY WITH OPEN ROTATOR CUFF REPAIR;  Surgeon: Thornton Park, MD;  Location: [facility] ORS;  Service: Orthopedics;  Laterality: Left;  . TOTAL KNEE ARTHROPLASTY Left 02/14/2018   Procedure: TOTAL KNEE ARTHROPLASTY;  Surgeon: Thornton Park, MD;  Location: [facility] ORS;  Service: Orthopedics;  Laterality: Left;    Prior to Admission medications   Medication Sig Start Date End Date Taking? Authorizing Provider  albuterol (VENTOLIN HFA) 108 (90 Base) MCG/ACT inhaler Inhale 2 puffs into the lungs every 6 (six) hours as needed for wheezing.   Yes [provider]  amitriptyline (ELAVIL) 25 MG tablet Take 25 mg by mouth at bedtime. 04/13/20  Yes [provider]  budesonide-formoterol (SYMBICORT) 160-4.5 MCG/ACT inhaler  Inhale 2 puffs into the lungs daily as needed (wheezing or shortness of breath).    Yes [provider]  diclofenac Sodium (VOLTAREN) 1 % GEL Apply 2 g topically daily as needed.   Yes [provider]  ferrous [id number] C-folic acid (TRINSICON / FOLTRIN) capsule Take 1 capsule by mouth 3 (three) times daily after meals. 05/08/20   Yes Lorella Nimrod, MD  furosemide (LASIX) 20 MG tablet Take 20 mg daily by mouth.    Yes [provider]  lisinopril (PRINIVIL,ZESTRIL) 20 MG tablet Take 20 mg by mouth daily.   Yes [provider]  potassium chloride (MICRO-K) 10 MEQ CR capsule Take 10 mEq by mouth daily.   Yes [provider]  rOPINIRole (REQUIP) 0.5 MG tablet Take 0.5 mg by mouth at bedtime as needed (restless leg syndrome).    Yes [provider]  simvastatin (ZOCOR) 20 MG tablet Take 20 mg by mouth daily.    Yes [provider]  SUMAtriptan (IMITREX) 6 MG/0.5ML SOLN injection Inject 6 mg into the skin every 2 (two) hours as needed for migraine or headache. May repeat in 2 hours if headache persists or recurs.   Yes [provider]  tiotropium (SPIRIVA) 18 MCG inhalation capsule Place 18 mcg into inhaler and inhale daily.   Yes [provider]  vitamin B-12 (CYANOCOBALAMIN) 1000 MCG tablet Take 1,000 mcg by mouth daily.   Yes [provider]    Family History  Problem Relation Age of Onset  . CAD Mother   . CAD Sister   . Breast cancer Neg Hx      Social History   Tobacco Use  . Smoking status: Some Days    Packs/day: 0.10    Years: 50.00    Pack years: 5.00    Types: Cigarettes    Last attempt to quit: 03/13/2016    Years since quitting: 4.7  . Smokeless tobacco: Never  Vaping Use  . Vaping Use: Never used  Substance Use Topics  . Alcohol use: No  . Drug use: No    Allergies as of 12/01/2020 - Review Complete 12/01/2020  Allergen Reaction Noted  . Latex Rash 02/03/2015    Physical Examination:  Constitutional: General:   Alert,  Well-developed, well-nourished, pleasant and cooperative in NAD BP 125/84   Pulse (!) 103   Temp 98.7 F (37.1 C) (Oral)   Wt 195 lb 12.8 oz (88.8 kg)   BMI 33.61 kg/m   Respiratory: Normal respiratory effort  Gastrointestinal:  Normal bowel sounds.  No bruits.  Soft, non-tender and  non-distended without masses, hepatosplenomegaly or hernias noted.  No guarding or rebound tenderness.     Cardiac: No clubbing or edema.  No cyanosis. Normal posterior tibial pedal pulses noted.  Psych:  Alert and cooperative. Normal mood and affect.  Musculoskeletal:  Normal gait. Head normocephalic, atraumatic. Symmetrical without gross deformities. 5/5 Lower extremity strength bilaterally.  Skin: Warm. Intact without significant lesions or rashes. No jaundice.  Neck: Supple, trachea midline  Lymph: No cervical lymphadenopathy  Psych:  Alert and oriented x3, Alert and cooperative. Normal mood and affect.  Labs: CMP     Component Value Date/Time   NA 138 05/06/2020 0537   K 4.3 05/06/2020 0537   CL 106 05/06/2020 0537   CO2 23 05/06/2020 0537   GLUCOSE 108 (H) 05/06/2020 0537   BUN 10 05/06/2020 0537   CREATININE 0.98 05/06/2020 0537   CALCIUM 8.4 (L) 05/06/2020 0537   PROT  6.2 (L) 05/06/2020 0309   ALBUMIN 3.7 05/06/2020 0309   AST 19 05/06/2020 0309   ALT 14 05/06/2020 0309   ALKPHOS 30 (L) 05/06/2020 0309   BILITOT 0.3 05/06/2020 0309   GFRNONAA >60 05/06/2020 0537   GFRAA >60 02/16/2018 1523   Lab Results  Component Value Date   WBC 4.8 10/11/2020   HGB 11.9 (L) 10/11/2020   HCT 37.8 10/11/2020   MCV 82.2 10/11/2020   PLT 267 10/11/2020    Imaging Studies:   Assessment and Plan:   Hailey Farrell is a 70 y.o. 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  This would also allow to see if intestinal metaplasia still present given that H. pylori treatment was done  previously  Proceed with EGD for follow-up of gastric intestinal metaplasia and also obtain biopsies of duodenal bulb for previously seen changes in the duodenal bulb, if still present  I have discussed alternative options, risks & benefits,  which include, but are not limited to, bleeding, infection, perforation,respiratory complication & drug reaction.  The patient agrees with this plan & written consent will be obtained.     Dr Hailey Farrell

## 2020-12-02 ENCOUNTER — Other Ambulatory Visit: Payer: Medicare HMO

## 2020-12-02 ENCOUNTER — Ambulatory Visit: Payer: Medicare HMO | Admitting: Oncology

## 2020-12-21 ENCOUNTER — Other Ambulatory Visit: Payer: Self-pay | Admitting: Family Medicine

## 2020-12-21 DIAGNOSIS — Z1231 Encounter for screening mammogram for malignant neoplasm of breast: Secondary | ICD-10-CM

## 2020-12-22 ENCOUNTER — Encounter: Payer: Self-pay | Admitting: Gastroenterology

## 2020-12-23 ENCOUNTER — Ambulatory Visit: Payer: Medicare HMO | Admitting: Anesthesiology

## 2020-12-23 ENCOUNTER — Encounter
Admission: RE | Disposition: A | Discharge status: Home / Self Care | Payer: Self-pay | Source: Home / Self Care | Attending: Gastroenterology

## 2020-12-23 ENCOUNTER — Ambulatory Visit
Admission: RE | Admit: 2020-12-23 | Discharge: 2020-12-23 | Disposition: A | Discharge status: Home / Self Care | Payer: Medicare HMO | Attending: Gastroenterology | Admitting: Gastroenterology

## 2020-12-23 ENCOUNTER — Encounter: Payer: Self-pay | Admitting: Gastroenterology

## 2020-12-23 DIAGNOSIS — K2950 Unspecified chronic gastritis without bleeding: Secondary | ICD-10-CM | POA: Diagnosis not present

## 2020-12-23 DIAGNOSIS — K31A11 Gastric intestinal metaplasia without dysplasia, involving the antrum: Secondary | ICD-10-CM | POA: Insufficient documentation

## 2020-12-23 DIAGNOSIS — Z09 Encounter for follow-up examination after completed treatment for conditions other than malignant neoplasm: Secondary | ICD-10-CM | POA: Diagnosis present

## 2020-12-23 DIAGNOSIS — K31A0 Gastric intestinal metaplasia, unspecified: Secondary | ICD-10-CM

## 2020-12-23 DIAGNOSIS — Z91040 Latex allergy status: Secondary | ICD-10-CM | POA: Diagnosis not present

## 2020-12-23 DIAGNOSIS — Z79899 Other long term (current) drug therapy: Secondary | ICD-10-CM | POA: Insufficient documentation

## 2020-12-23 DIAGNOSIS — K3189 Other diseases of stomach and duodenum: Secondary | ICD-10-CM | POA: Diagnosis not present

## 2020-12-23 DIAGNOSIS — F17210 Nicotine dependence, cigarettes, uncomplicated: Secondary | ICD-10-CM | POA: Diagnosis not present

## 2020-12-23 DIAGNOSIS — Z8719 Personal history of other diseases of the digestive system: Secondary | ICD-10-CM | POA: Diagnosis not present

## 2020-12-23 DIAGNOSIS — K449 Diaphragmatic hernia without obstruction or gangrene: Secondary | ICD-10-CM | POA: Diagnosis not present

## 2020-12-23 DIAGNOSIS — K319 Disease of stomach and duodenum, unspecified: Secondary | ICD-10-CM

## 2020-12-23 DIAGNOSIS — K317 Polyp of stomach and duodenum: Secondary | ICD-10-CM | POA: Diagnosis not present

## 2020-12-23 HISTORY — PX: ESOPHAGOGASTRODUODENOSCOPY (EGD) WITH PROPOFOL: SHX5813

## 2020-12-23 SURGERY — ESOPHAGOGASTRODUODENOSCOPY (EGD) WITH PROPOFOL
Anesthesia: General

## 2020-12-23 MED ORDER — LIDOCAINE HCL (CARDIAC) PF 100 MG/5ML IV SOSY
PREFILLED_SYRINGE | INTRAVENOUS | Status: DC | PRN
  Administered 2020-12-23: 100 mg via INTRAVENOUS

## 2020-12-23 MED ORDER — PROPOFOL 10 MG/ML IV BOLUS
INTRAVENOUS | Status: DC | PRN
  Administered 2020-12-23: 30 mg via INTRAVENOUS
  Administered 2020-12-23: 60 mg via INTRAVENOUS
  Administered 2020-12-23 (×2): 20 mg via INTRAVENOUS

## 2020-12-23 MED ORDER — GLYCOPYRROLATE 0.2 MG/ML IJ SOLN
INTRAMUSCULAR | Status: DC | PRN
  Administered 2020-12-23: .2 mg via INTRAVENOUS

## 2020-12-23 MED ORDER — SODIUM CHLORIDE 0.9 % IV SOLN
INTRAVENOUS | Status: DC
  Administered 2020-12-23: 1000 mL via INTRAVENOUS

## 2020-12-23 NOTE — Transfer of Care (Signed)
Immediate Anesthesia Transfer of Care Note  Patient: Hailey Farrell  Procedure(s) Performed: ESOPHAGOGASTRODUODENOSCOPY (EGD) WITH PROPOFOL  Patient Location: PACU  Anesthesia Type:General  Level of Consciousness: awake, alert  and oriented  Airway & Oxygen Therapy: Patient Spontanous Breathing  Post-op Assessment: Report given to RN and Post -op Vital signs reviewed and stable  Post vital signs: Reviewed and stable  Last Vitals:  Vitals Value Taken Time  BP 102/69 12/23/20 0857  Temp 36.1 C 12/23/20 0857  Pulse 102 12/23/20 0858  Resp 29 12/23/20 0858  SpO2 94 % 12/23/20 0858  Vitals shown include unvalidated device data.  Last Pain:  Vitals:   12/23/20 0857  TempSrc: Temporal  PainSc: Asleep         Complications: No notable events documented.

## 2020-12-23 NOTE — Anesthesia Postprocedure Evaluation (Signed)
Anesthesia Post Note  Patient: Hailey Farrell  Procedure(s) Performed: ESOPHAGOGASTRODUODENOSCOPY (EGD) WITH PROPOFOL  Patient location during evaluation: Phase II Anesthesia Type: General Level of consciousness: awake and alert, awake and oriented Pain management: pain level controlled Vital Signs Assessment: post-procedure vital signs reviewed and stable Respiratory status: spontaneous breathing, nonlabored ventilation and respiratory function stable Cardiovascular status: blood pressure returned to baseline and stable Postop Assessment: no apparent nausea or vomiting Anesthetic complications: no   No notable events documented.   Last Vitals:  Vitals:   12/23/20 0917 12/23/20 0927  BP: 106/77 115/80  Pulse:    Resp:    Temp:    SpO2:      Last Pain:  Vitals:   12/23/20 0927  TempSrc:   PainSc: 0-No pain                 Phill Mutter

## 2020-12-23 NOTE — H&P (Signed)
Vonda Antigua, MD [address] [place], [place], [place], [address] [address], [place], [place], [place], [address] Phone: [phone]  Fax: [phone]  Primary Care Physician:  Letta Median, MD   Pre-Procedure History & Physical: HPI:  Hailey Farrell is a 70 y.o. female is here for an EGD.   Past Medical History:  Diagnosis Date   Anemia    Arthritis    Breast cancer ([place]) 1990   Right   COPD (chronic obstructive pulmonary disease) ([place])    Dyspnea    DOE   Elevated lipids    Hypertension    Lower extremity edema    Migraines    Migraines    Personal history of radiation therapy 1990   Breast   Restless leg syndrome    Rotator cuff injury     Past Surgical History:  Procedure Laterality Date   ABDOMINAL HYSTERECTOMY     BACK SURGERY  08/2017   CERVICAL FUSION   BREAST BIOPSY Right 1990   LUMPECTOMY.  took lymph nodes   COLONOSCOPY WITH PROPOFOL N/A 07/20/2017   Procedure: COLONOSCOPY WITH PROPOFOL;  Surgeon: Virgel Manifold, MD;  Location: [facility] ENDOSCOPY;  Service: Endoscopy;  Laterality: N/A;   ENTEROSCOPY N/A 08/08/2017   Procedure: ENTEROSCOPY;  Surgeon: Lin Landsman, MD;  Location: [facility] ENDOSCOPY;  Service: Gastroenterology;  Laterality: N/A;   ENTEROSCOPY N/A 05/07/2020   Procedure: ENTEROSCOPY with Adult colonoscope;  Surgeon: Virgel Manifold, MD;  Location: [facility] ENDOSCOPY;  Service: Endoscopy;  Laterality: N/A;   ESOPHAGOGASTRODUODENOSCOPY Left 04/13/2017   Procedure: ESOPHAGOGASTRODUODENOSCOPY (EGD);  Surgeon: Virgel Manifold, MD;  Location: [facility] ENDOSCOPY;  Service: Endoscopy;  Laterality: Left;   ESOPHAGOGASTRODUODENOSCOPY (EGD) WITH PROPOFOL N/A 07/20/2017   Procedure: ESOPHAGOGASTRODUODENOSCOPY (EGD) WITH PROPOFOL;  Surgeon: Virgel Manifold, MD;  Location: [facility] ENDOSCOPY;  Service: Endoscopy;  Laterality: N/A;   ESOPHAGOGASTRODUODENOSCOPY (EGD) WITH PROPOFOL N/A 12/12/2017   Procedure: ESOPHAGOGASTRODUODENOSCOPY  (EGD) WITH PROPOFOL;  Surgeon: Virgel Manifold, MD;  Location: [facility] ENDOSCOPY;  Service: Endoscopy;  Laterality: N/A;   MEDIAL PARTIAL KNEE REPLACEMENT Left 1990   torn ligament. no knee replacement at that time   PARTIAL HYSTERECTOMY     SHOULDER ARTHROSCOPY WITH OPEN ROTATOR CUFF REPAIR Left 03/01/2016   Procedure: SHOULDER ARTHROSCOPY WITH OPEN ROTATOR CUFF REPAIR;  Surgeon: Thornton Park, MD;  Location: [facility] ORS;  Service: Orthopedics;  Laterality: Left;   TOTAL KNEE ARTHROPLASTY Left 02/14/2018   Procedure: TOTAL KNEE ARTHROPLASTY;  Surgeon: Thornton Park, MD;  Location: [facility] ORS;  Service: Orthopedics;  Laterality: Left;    Prior to Admission medications   Medication Sig Start Date End Date Taking? Authorizing Provider  albuterol (VENTOLIN HFA) 108 (90 Base) MCG/ACT inhaler Inhale 2 puffs into the lungs every 6 (six) hours as needed for wheezing.   Yes [provider]  amitriptyline (ELAVIL) 25 MG tablet Take 25 mg by mouth at bedtime. 04/13/20  Yes [provider]  budesonide-formoterol (SYMBICORT) 160-4.5 MCG/ACT inhaler Inhale 2 puffs into the lungs daily as needed (wheezing or shortness of breath).    Yes [provider]  diclofenac Sodium (VOLTAREN) 1 % GEL Apply 2 g topically daily as needed.   Yes [provider]  ferrous [id number] C-folic acid (TRINSICON / FOLTRIN) capsule Take 1 capsule by mouth 3 (three) times daily after meals. 05/08/20  Yes Lorella Nimrod, MD  furosemide (LASIX) 20 MG tablet Take 20 mg daily by mouth.    Yes [provider]  lisinopril (  PRINIVIL,ZESTRIL) 20 MG tablet Take 20 mg by mouth daily.   Yes [provider]  potassium chloride (MICRO-K) 10 MEQ CR capsule Take 10 mEq by mouth daily.   Yes [provider]  rOPINIRole (REQUIP) 0.5 MG tablet Take 0.5 mg by mouth at bedtime as needed (restless leg syndrome).    Yes [provider]  simvastatin (ZOCOR) 20 MG tablet Take 20  mg by mouth daily.    Yes [provider]  SUMAtriptan (IMITREX) 6 MG/0.5ML SOLN injection Inject 6 mg into the skin every 2 (two) hours as needed for migraine or headache. May repeat in 2 hours if headache persists or recurs.   Yes [provider]  tiotropium (SPIRIVA) 18 MCG inhalation capsule Place 18 mcg into inhaler and inhale daily.   Yes [provider]  vitamin B-12 (CYANOCOBALAMIN) 1000 MCG tablet Take 1,000 mcg by mouth daily.   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  Tobacco Use   Smoking status: Some Days    Packs/day: 0.25    Years: 50.00    Pack years: 12.50    Types: Cigarettes    Last attempt to quit: 03/13/2016    Years since quitting: 4.7   Smokeless tobacco: Never  Vaping Use   Vaping Use: Never used  Substance and Sexual Activity   Alcohol use: No   Drug use: No   Sexual activity: Not on file  Other Topics Concern   Not on file  Social History Narrative   Not on file   Social Determinants of Health   Financial Resource Strain: Not on file  Food Insecurity: Not on file  Transportation Needs: Not on file  Physical Activity: Not on file  Stress: Not on file  Social Connections: Not on file  Intimate Partner Violence: Not on file    Review of Systems: See HPI, otherwise negative ROS  Constitutional: General:   Alert,  Well-developed, well-nourished, pleasant and cooperative in NAD BP (!) 135/108   Pulse (!) 110   Temp (!) 96.2 F (35.7 C)  (Temporal)   Resp 20   Ht '5\' 4"'$  (1.626 m)   Wt 88 kg   SpO2 100%   BMI 33.30 kg/m   Head: Normocephalic, atraumatic.   Eyes:  Sclera clear, no icterus.   Conjunctiva pink.   Mouth:  No deformity or lesions, oropharynx pink & moist.  Neck:  Supple, trachea midline  Respiratory: Normal respiratory effort  Gastrointestinal:  Soft, non-tender and non-distended without masses, hepatosplenomegaly or hernias noted.  No guarding or rebound tenderness.     Cardiac: No clubbing or edema.  No cyanosis. Normal posterior tibial pedal pulses noted.  Lymphatic:  No significant cervical adenopathy.  Psych:  Alert and cooperative. Normal mood and affect.  Musculoskeletal:   Symmetrical without gross deformities. 5/5 Lower extremity strength bilaterally.  Skin: Warm. Intact without significant lesions or rashes. No  jaundice.  Neurologic:  Face symmetrical, tongue midline, Normal sensation to touch;  grossly normal neurologically.  Psych:  Alert and oriented x3, Alert and cooperative. Normal mood and affect.  Impression/Plan: Hailey Farrell is here for an EGD for follow up of gastric intestinal metaplasia. Pt also has History of iron deficiency anemia and AVMs.  Risks, benefits, limitations, and alternatives regarding the procedure have been reviewed with the patient.  Questions have been answered.  All parties agreeable.   Virgel Manifold, MD  12/23/2020, 8:30 AM

## 2020-12-23 NOTE — Anesthesia Preprocedure Evaluation (Signed)
Anesthesia Evaluation  Patient identified by MRN, date of birth, ID band Patient awake    Reviewed: Allergy & Precautions, H&P , NPO status , Patient's Chart, lab work & pertinent test results  History of Anesthesia Complications Negative for: history of anesthetic complications  Airway Mallampati: III  TM Distance: >3 FB Neck ROM: full    Dental  (+) Chipped, Poor Dentition, Missing, Dental Advidsory Given, Partial Upper   Pulmonary shortness of breath and with exertion, COPD,  COPD inhaler, neg recent URI, Current Smoker and Patient abstained from smoking., former smoker,    Pulmonary exam normal  + decreased breath sounds      Cardiovascular Exercise Tolerance: Good hypertension, Pt. on medications (-) angina(-) Past MI Normal cardiovascular exam(-) dysrhythmias (-) Valvular Problems/Murmurs     Neuro/Psych  Headaches, neg Seizures  Neuromuscular disease negative psych ROS   GI/Hepatic Neg liver ROS, hiatal hernia, PUD, GERD  Medicated and Controlled,  Endo/Other  negative endocrine ROS  Renal/GU      Musculoskeletal  (+) Arthritis ,   Abdominal   Peds  Hematology  (+) Blood dyscrasia, anemia ,   Anesthesia Other Findings Anemia    Arthritis    Breast cancer ([place]) 1990 Right  COPD (chronic obstructive pulmonary disease) ([facility])    Dyspnea  DOE  Elevated lipids    Hypertension    Lower extremity edema   Migraines    Migraines    Personal history of radiation therapy 1990 Breast  Restless leg syndrome   Rotator cuff injury       Reproductive/Obstetrics negative OB ROS                            Anesthesia Physical  Anesthesia Plan  ASA: 3  Anesthesia Plan: General   Post-op Pain Management:    Induction: Intravenous  PONV Risk Score and Plan: Propofol infusion and TIVA  Airway Management Planned: Natural Airway and Nasal Cannula  Additional Equipment:   Intra-op  Plan:   Post-operative Plan:   Informed Consent: I have reviewed the patients History and Physical, chart, labs and discussed the procedure including the risks, benefits and alternatives for the proposed anesthesia with the patient or authorized representative who has indicated his/her understanding and acceptance.     Dental Advisory Given  Plan Discussed with: Anesthesiologist, CRNA and Surgeon  Anesthesia Plan Comments: (Patient declines spinal and requests GA  Patient consented for risks of anesthesia including but not limited to:  - adverse reactions to medications - damage to teeth, lips or other oral mucosa - sore throat or hoarseness - Damage to heart, brain, lungs or loss of life  Patient voiced understanding.)       Anesthesia Quick Evaluation

## 2020-12-23 NOTE — Op Note (Signed)
[facility] Patient Name: Hailey Farrell Procedure Date: 12/23/2020 8:33 AM MRN: [id number] Account #: [id number] Date of Birth: 22-Sep-1950 Admit Type: Outpatient Age: 70 Room: [facility] ENDO ROOM 2 Gender: Female Note Status: Finalized Procedure:             Upper GI endoscopy Indications:           Follow-up of intestinal metaplasia Providers:             Hala Narula B. Bonna Gains MD, MD Referring MD:          Baxter Kail. Rebeca Alert MD, MD (Referring MD) Medicines:             Monitored Anesthesia Care Complications:         No immediate complications. Procedure:             Pre-Anesthesia Assessment:                        - Prior to the procedure, a History and Physical was                         performed, and patient medications, allergies and                         sensitivities were reviewed. The patient's tolerance                         of previous anesthesia was reviewed.                        - The risks and benefits of the procedure and the                         sedation options and risks were discussed with the                         patient. All questions were answered and informed                         consent was obtained.                        - Patient identification and proposed procedure were                         verified prior to the procedure by the physician, the                         nurse, the anesthesiologist, the anesthetist and the                         technician. The procedure was verified in the                         procedure room.                        - ASA Grade Assessment: II - A patient with mild  systemic disease.                        After obtaining informed consent, the endoscope was                         passed under direct vision. Throughout the procedure,                         the patient's blood pressure, pulse, and oxygen                         saturations were monitored  continuously. The Endoscope                         was introduced through the mouth, and advanced to the                         second part of duodenum. The upper GI endoscopy was                         accomplished with ease. The patient tolerated the                         procedure well. Findings:      The examined esophagus was normal.      Patchy mildly erythematous mucosa without bleeding was found in the       gastric antrum. Imaging was performed using narrow band imaging to       visualize the mucosa. Biopsies were taken with a cold forceps for       histology. Biopsies were obtained in the gastric body, at the incisura       and in the gastric antrum with cold forceps for histology.      A single 6 mm mucosal papule (nodule) with no bleeding and no stigmata       of recent bleeding was found in the gastric antrum. Imaging was       performed using narrow band imaging to visualize the mucosa. Biopsies       were taken with a cold forceps for histology.      A hiatal hernia was present.      The exam of the stomach was otherwise normal.      A few 3 to 4 mm sessile polyps with no bleeding were found in the       duodenal bulb. Biopsies were taken with a cold forceps for histology.      Nodular mucosa was found in the duodenal bulb. Biopsies were taken with       a cold forceps for histology.      The second portion of the duodenum was normal. Impression:            - Normal esophagus.                        - Erythematous mucosa in the antrum. Biopsied.                        - A single mucosal papule (nodule) found in the  stomach. Biopsied.                        - Hiatal hernia.                        - A few duodenal polyps. Biopsied.                        - Nodular mucosa in the duodenal bulb. Biopsied.                        - Normal second portion of the duodenum.                        - Biopsies were obtained in the gastric body, at the                          incisura and in the gastric antrum. Recommendation:        - Await pathology results.                        - Discharge patient to home (with escort).                        - Advance diet as tolerated.                        - Continue present medications.                        - Patient has a contact number available for                         emergencies. The signs and symptoms of potential                         delayed complications were discussed with the patient.                         Return to normal activities tomorrow. Written                         discharge instructions were provided to the patient.                        - Discharge patient to home (with escort).                        - The findings and recommendations were discussed with                         the patient.                        - The findings and recommendations were discussed with                         the patient's family. Procedure Code(s):     --- Professional ---  31427, Esophagogastroduodenoscopy, flexible,                         transoral; with biopsy, single or multiple Diagnosis Code(s):     --- Professional ---                        K31.89, Other diseases of stomach and duodenum                        K44.9, Diaphragmatic hernia without obstruction or                         gangrene                        K31.7, Polyp of stomach and duodenum CPT copyright 2019 [employer] Association. All rights reserved. The codes documented in this report are preliminary and upon coder review may  be revised to meet current compliance requirements.  Vonda Antigua, MD Margretta Sidle B. Bonna Gains MD, MD 12/23/2020 9:02:01 AM This report has been signed electronically. Number of Addenda: 0 Note Initiated On: 12/23/2020 8:33 AM Estimated Blood Loss:  Estimated blood loss: none.      [facility]

## 2020-12-24 ENCOUNTER — Encounter: Payer: Self-pay | Admitting: Gastroenterology

## 2020-12-24 LAB — SURGICAL PATHOLOGY

## 2020-12-29 ENCOUNTER — Other Ambulatory Visit: Payer: Self-pay

## 2020-12-29 ENCOUNTER — Ambulatory Visit
Admission: RE | Admit: 2020-12-29 | Discharge: 2020-12-29 | Disposition: A | Discharge status: Home / Self Care | Payer: Medicare HMO | Source: Ambulatory Visit | Attending: Family Medicine | Admitting: Family Medicine

## 2020-12-29 DIAGNOSIS — Z1231 Encounter for screening mammogram for malignant neoplasm of breast: Secondary | ICD-10-CM | POA: Diagnosis present

## 2021-01-14 ENCOUNTER — Encounter: Payer: Self-pay | Admitting: Oncology

## 2021-01-14 ENCOUNTER — Inpatient Hospital Stay: Payer: Medicare HMO

## 2021-01-14 ENCOUNTER — Inpatient Hospital Stay: Payer: Medicare HMO | Admitting: Oncology

## 2021-01-14 NOTE — Progress Notes (Signed)
This encounter was created in error - please disregard.

## 2021-02-16 ENCOUNTER — Inpatient Hospital Stay: Payer: Medicare HMO | Attending: Oncology

## 2021-02-16 ENCOUNTER — Inpatient Hospital Stay (HOSPITAL_BASED_OUTPATIENT_CLINIC_OR_DEPARTMENT_OTHER): Payer: Medicare HMO | Admitting: Oncology

## 2021-02-16 ENCOUNTER — Inpatient Hospital Stay: Payer: Medicare HMO

## 2021-02-16 ENCOUNTER — Encounter: Payer: Self-pay | Admitting: Oncology

## 2021-02-16 ENCOUNTER — Other Ambulatory Visit: Payer: Self-pay

## 2021-02-16 VITALS — BP 127/72 | HR 92 | Temp 95.0°F | Resp 17 | Wt 192.0 lb

## 2021-02-16 DIAGNOSIS — D508 Other iron deficiency anemias: Secondary | ICD-10-CM

## 2021-02-16 DIAGNOSIS — R42 Dizziness and giddiness: Secondary | ICD-10-CM | POA: Insufficient documentation

## 2021-02-16 DIAGNOSIS — R0602 Shortness of breath: Secondary | ICD-10-CM | POA: Insufficient documentation

## 2021-02-16 DIAGNOSIS — Z79899 Other long term (current) drug therapy: Secondary | ICD-10-CM | POA: Diagnosis not present

## 2021-02-16 DIAGNOSIS — R5383 Other fatigue: Secondary | ICD-10-CM | POA: Insufficient documentation

## 2021-02-16 DIAGNOSIS — D509 Iron deficiency anemia, unspecified: Secondary | ICD-10-CM | POA: Insufficient documentation

## 2021-02-16 DIAGNOSIS — K5900 Constipation, unspecified: Secondary | ICD-10-CM | POA: Insufficient documentation

## 2021-02-16 LAB — CBC WITH DIFFERENTIAL/PLATELET
Abs Immature Granulocytes: 0.02 10*3/uL (ref 0.00–0.07)
Basophils Absolute: 0 10*3/uL (ref 0.0–0.1)
Basophils Relative: 0 %
Eosinophils Absolute: 0 10*3/uL (ref 0.0–0.5)
Eosinophils Relative: 1 %
HCT: 27.1 % — ABNORMAL LOW (ref 36.0–46.0)
Hemoglobin: 8 g/dL — ABNORMAL LOW (ref 12.0–15.0)
Immature Granulocytes: 0 %
Lymphocytes Relative: 25 %
Lymphs Abs: 1.6 10*3/uL (ref 0.7–4.0)
MCH: 25.2 pg — ABNORMAL LOW (ref 26.0–34.0)
MCHC: 29.5 g/dL — ABNORMAL LOW (ref 30.0–36.0)
MCV: 85.2 fL (ref 80.0–100.0)
Monocytes Absolute: 0.5 10*3/uL (ref 0.1–1.0)
Monocytes Relative: 8 %
Neutro Abs: 4.2 10*3/uL (ref 1.7–7.7)
Neutrophils Relative %: 66 %
Platelets: 376 10*3/uL (ref 150–400)
RBC: 3.18 MIL/uL — ABNORMAL LOW (ref 3.87–5.11)
RDW: 18.4 % — ABNORMAL HIGH (ref 11.5–15.5)
WBC: 6.3 10*3/uL (ref 4.0–10.5)
nRBC: 0 % (ref 0.0–0.2)

## 2021-02-16 LAB — IRON AND TIBC
Iron: 18 ug/dL — ABNORMAL LOW (ref 28–170)
Saturation Ratios: 4 % — ABNORMAL LOW (ref 10.4–31.8)
TIBC: 426 ug/dL (ref 250–450)
UIBC: 408 ug/dL

## 2021-02-16 LAB — FERRITIN: Ferritin: 13 ng/mL (ref 11–307)

## 2021-02-16 MED ORDER — SODIUM CHLORIDE 0.9 % IV SOLN
Freq: Once | INTRAVENOUS | Status: AC
Start: 2021-02-16 — End: 2021-02-16
  Filled 2021-02-16: qty 250

## 2021-02-16 MED ORDER — SODIUM CHLORIDE 0.9 % IV SOLN: 200.0000 mg | Freq: Once | INTRAVENOUS | Status: DC

## 2021-02-16 MED ORDER — IRON SUCROSE 20 MG/ML IV SOLN
200.0000 mg | Freq: Once | INTRAVENOUS | Status: AC
Start: 2021-02-16 — End: 2021-02-16
  Administered 2021-02-16: 200 mg via INTRAVENOUS
  Filled 2021-02-16: qty 10

## 2021-02-16 NOTE — Progress Notes (Signed)
[place]  [phone] [phone]) [phone]  ID: Hailey Farrell OB: January 23, 1951  MR#: [id number]  [id number]  Patient Care Team: Letta Median, MD as PCP - General (Family Medicine)  CHIEF COMPLAINT: Iron deficiency anemia  HPI- Patient was initially hospitalized from 05/06/20-05/08/2020 for acute onset dizziness and lightheadedness with presyncopal episode.  Lab work showed a hemoglobin of 3.4.  She received 3 units of packed red blood cells.  Iron studies were found to be very low.  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  They were treated with argon plasma coagulation.  She received 1 dose of iron on 05/07/2020.   INTERVAL HISTORY:  Patient returns to clinic for routine evaluation for iron deficiency anemia.  She continues to tolerate oral iron except for some constipation.  She had an upper endoscopy completed on 12/23/2020 which showed a normal esophagus, hiatal hernia, duodenal polyps with erythematous mucosa in the antrum, mucosal papule in the stomach and nodular mucosa in the duodenal bulb.  Biopsies returned and are negative for malignancy or dysplasia.  She returns today with her husband.  She reports significant dizziness and lightheadedness especially when standing.  She denies any episodes of bleeding.  All of the symptoms started approximately a week after her endoscopy.  She feels weak and occasionally short of breath.  She is very anxious.  She denies any pain associated specifically abdominal pain.  REVIEW OF SYSTEMS:   Review of Systems  Constitutional:  Positive for malaise/fatigue. Negative for chills, fever and weight loss.  HENT:  Negative for congestion, ear pain and tinnitus.   Eyes: Negative.  Negative for blurred vision and double vision.  Respiratory:  Positive for shortness of breath. Negative for cough and sputum production.    Cardiovascular: Negative.  Negative for chest pain, palpitations and leg swelling.  Gastrointestinal: Negative.  Negative for abdominal pain, constipation, diarrhea, nausea and vomiting.  Genitourinary:  Negative for dysuria, frequency and urgency.  Musculoskeletal:  Negative for back pain and falls.  Skin: Negative.  Negative for rash.  Neurological:  Positive for dizziness and weakness. Negative for headaches.  Endo/Heme/Allergies: Negative.  Does not bruise/bleed easily.  Psychiatric/Behavioral:  Negative for depression. The patient is nervous/anxious. The patient does not have insomnia.    As per HPI. Otherwise, a complete review of systems is negative.  PAST MEDICAL HISTORY: Past Medical History:  Diagnosis Date   Anemia    Arthritis    Breast cancer ([place]) 1990   Right   COPD (chronic obstructive pulmonary disease) ([place])    Dyspnea    DOE   Elevated lipids    Hypertension    Lower extremity edema    Migraines    Migraines    Personal history of radiation therapy 1990   Breast   Restless leg syndrome    Rotator cuff injury     PAST SURGICAL HISTORY: Past Surgical History:  Procedure Laterality Date   ABDOMINAL HYSTERECTOMY     BACK SURGERY  08/2017   CERVICAL FUSION   BREAST BIOPSY Right 1990   LUMPECTOMY.  took lymph nodes   BREAST LUMPECTOMY     COLONOSCOPY WITH PROPOFOL N/A 07/20/2017   Procedure: COLONOSCOPY WITH PROPOFOL;  Surgeon: Virgel Manifold, MD;  Location: [facility] ENDOSCOPY;  Service: Endoscopy;  Laterality: N/A;   ENTEROSCOPY N/A 08/08/2017   Procedure: ENTEROSCOPY;  Surgeon: Lin Landsman, MD;  Location: [facility] ENDOSCOPY;  Service: Gastroenterology;  Laterality: N/A;   ENTEROSCOPY N/A 05/07/2020   Procedure: ENTEROSCOPY with Adult colonoscope;  Surgeon: Virgel Manifold, MD;  Location: [facility] ENDOSCOPY;  Service: Endoscopy;  Laterality: N/A;   ESOPHAGOGASTRODUODENOSCOPY Left 04/13/2017   Procedure: ESOPHAGOGASTRODUODENOSCOPY (EGD);  Surgeon:  Virgel Manifold, MD;  Location: [facility] ENDOSCOPY;  Service: Endoscopy;  Laterality: Left;   ESOPHAGOGASTRODUODENOSCOPY (EGD) WITH PROPOFOL N/A 07/20/2017   Procedure: ESOPHAGOGASTRODUODENOSCOPY (EGD) WITH PROPOFOL;  Surgeon: Virgel Manifold, MD;  Location: [facility] ENDOSCOPY;  Service: Endoscopy;  Laterality: N/A;   ESOPHAGOGASTRODUODENOSCOPY (EGD) WITH PROPOFOL N/A 12/12/2017   Procedure: ESOPHAGOGASTRODUODENOSCOPY (EGD) WITH PROPOFOL;  Surgeon: Virgel Manifold, MD;  Location: [facility] ENDOSCOPY;  Service: Endoscopy;  Laterality: N/A;   ESOPHAGOGASTRODUODENOSCOPY (EGD) WITH PROPOFOL N/A 12/23/2020   Procedure: ESOPHAGOGASTRODUODENOSCOPY (EGD) WITH PROPOFOL;  Surgeon: Virgel Manifold, MD;  Location: [facility] ENDOSCOPY;  Service: Endoscopy;  Laterality: N/A;   MEDIAL PARTIAL KNEE REPLACEMENT Left 1990   torn ligament. no knee replacement at that time   PARTIAL HYSTERECTOMY     SHOULDER ARTHROSCOPY WITH OPEN ROTATOR CUFF REPAIR Left 03/01/2016   Procedure: SHOULDER ARTHROSCOPY WITH OPEN ROTATOR CUFF REPAIR;  Surgeon: Thornton Park, MD;  Location: [facility] ORS;  Service: Orthopedics;  Laterality: Left;   TOTAL KNEE ARTHROPLASTY Left 02/14/2018   Procedure: TOTAL KNEE ARTHROPLASTY;  Surgeon: Thornton Park, MD;  Location: [facility] ORS;  Service: Orthopedics;  Laterality: Left;    FAMILY HISTORY: Family History  Problem Relation Age of Onset   CAD Mother    CAD Sister    Breast cancer Neg Hx     ADVANCED DIRECTIVES (Y/N):  N  HEALTH MAINTENANCE: Social History   Tobacco Use   Smoking status: Some Days    Packs/day: 0.25    Years: 50.00    Pack years: 12.50    Types: Cigarettes    Last attempt to quit: 03/13/2016    Years since quitting: 4.9   Smokeless tobacco: Never  Vaping Use   Vaping Use: Never used  Substance Use Topics   Alcohol use: No   Drug use: No     Colonoscopy:  PAP:  Bone density:  Lipid panel:  Allergies  Allergen Reactions   Latex Rash    Welts over  body    Current Outpatient Medications  Medication Sig Dispense Refill   amitriptyline (ELAVIL) 25 MG tablet Take 25 mg by mouth at bedtime.     budesonide-formoterol (SYMBICORT) 160-4.5 MCG/ACT inhaler Inhale 2 puffs into the lungs daily as needed (wheezing or shortness of breath).      diclofenac Sodium (VOLTAREN) 1 % GEL Apply 2 g topically daily as needed.     ferrous [id number] C-folic acid (TRINSICON / FOLTRIN) capsule Take 1 capsule by mouth 3 (three) times daily after meals. 90 capsule 2   furosemide (LASIX) 20 MG tablet Take 20 mg daily by mouth.      lisinopril (PRINIVIL,ZESTRIL) 20 MG tablet Take 20 mg by mouth daily.     potassium chloride (MICRO-K) 10 MEQ CR capsule Take 10 mEq by mouth daily.     rOPINIRole (REQUIP) 0.5 MG tablet Take 0.5 mg by mouth at bedtime as needed (restless leg syndrome).      simvastatin (ZOCOR) 20 MG tablet Take 20 mg by mouth daily.      tiotropium (SPIRIVA) 18 MCG inhalation capsule Place 18 mcg into inhaler and inhale daily.     vitamin B-12 (CYANOCOBALAMIN) 1000 MCG tablet Take 1,000 mcg by mouth daily.     albuterol (VENTOLIN  HFA) 108 (90 Base) MCG/ACT inhaler Inhale 2 puffs into the lungs every 6 (six) hours as needed for wheezing.     SUMAtriptan (IMITREX) 6 MG/0.5ML SOLN injection Inject 6 mg into the skin every 2 (two) hours as needed for migraine or headache. May repeat in 2 hours if headache persists or recurs. (Patient not taking: Reported on 02/16/2021)     No current facility-administered medications for this visit.    OBJECTIVE: Vitals:   02/16/21 1316  BP: 127/72  Pulse: 92  Resp: 17  Temp: (!) 95 F (35 C)  SpO2: 100%      Body mass index is 32.96 kg/m.    ECOG FS:0 - Asymptomatic  Physical Exam Constitutional:      Appearance: Normal appearance.  HENT:     Head: Normocephalic and atraumatic.  Eyes:     Pupils: Pupils are equal, round, and reactive to light.  Cardiovascular:     Rate and Rhythm: Normal rate  and regular rhythm.     Heart sounds: Normal heart sounds. No murmur heard. Pulmonary:     Effort: Pulmonary effort is normal.     Breath sounds: Normal breath sounds. No wheezing.  Abdominal:     General: Bowel sounds are normal. There is no distension.     Palpations: Abdomen is soft.     Tenderness: There is no abdominal tenderness.  Musculoskeletal:        General: Normal range of motion.     Cervical back: Normal range of motion.  Skin:    General: Skin is warm and dry.     Findings: No rash.  Neurological:     Mental Status: She is alert and oriented to person, place, and time.  Psychiatric:        Judgment: Judgment normal.     LAB RESULTS:  Lab Results  Component Value Date   NA 138 05/06/2020   K 4.3 05/06/2020   CL 106 05/06/2020   CO2 23 05/06/2020   GLUCOSE 108 (H) 05/06/2020   BUN 10 05/06/2020   CREATININE 0.98 05/06/2020   CALCIUM 8.4 (L) 05/06/2020   PROT 6.2 (L) 05/06/2020   ALBUMIN 3.7 05/06/2020   AST 19 05/06/2020   ALT 14 05/06/2020   ALKPHOS 30 (L) 05/06/2020   BILITOT 0.3 05/06/2020   GFRNONAA >60 05/06/2020   GFRAA >60 02/16/2018    Lab Results  Component Value Date   WBC 6.3 02/16/2021   NEUTROABS 4.2 02/16/2021   HGB 8.0 (L) 02/16/2021   HCT 27.1 (L) 02/16/2021   MCV 85.2 02/16/2021   PLT 376 02/16/2021   Lab Results  Component Value Date   IRON 18 (L) 02/16/2021   TIBC 426 02/16/2021   IRONPCTSAT 4 (L) 02/16/2021   Lab Results  Component Value Date   FERRITIN 13 02/16/2021     STUDIES: No results found.  ASSESSMENT: Iron deficiency anemia.  PLAN:    1. Iron deficiency anemia:  Thought to be secondary to slow GI bleed.  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  They were treated with argon plasma coagulation.  She received 3 units of packed red blood cells while hospitalized in December 2021.  At discharge her hemoglobin had improved from  3.7-7.9.  She received 1 dose of IV Feraheme on 05/07/2020.  She is able to tolerate oral iron with only mild constipation.  She denies any active bleeding but does report symptoms of dizziness,  lightheadedness and occasional shortness of breath especially when standing.  Lab work from today shows a hemoglobin of 8.0, iron saturation 4% with a ferritin of 13.  She is scheduled for follow-up with GI tomorrow.  Recommend 5 doses of IV Venofer starting today.  Disposition: Proceed with IV Venofer today followed by 4 additional doses.  Return to clinic in 3 months for follow-up, labs (iron, ferritin, CBC and CMP) plus possible IV iron.  I spent 20 minutes dedicated to the care of this patient (face-to-face and non-face-to-face) on the date of the encounter to include what is described in the assessment and plan.  Patient expressed understanding and was in agreement with this plan. She also understands that She can call clinic at any time with any questions, concerns, or complaints.    Jacquelin Hawking, NP   02/16/2021 1:55 PM

## 2021-02-16 NOTE — Progress Notes (Signed)
Patient here for oncology follow-up appointment, concerns of headaches, lightheadedness & loss of appetite. Offered ensure samples.

## 2021-02-16 NOTE — Patient Instructions (Signed)

## 2021-02-17 ENCOUNTER — Ambulatory Visit (INDEPENDENT_AMBULATORY_CARE_PROVIDER_SITE_OTHER): Payer: Medicare HMO | Admitting: Gastroenterology

## 2021-02-17 VITALS — BP 128/77 | HR 103 | Temp 98.2°F | Ht 64.0 in | Wt 190.8 lb

## 2021-02-17 DIAGNOSIS — D509 Iron deficiency anemia, unspecified: Secondary | ICD-10-CM | POA: Diagnosis not present

## 2021-02-17 DIAGNOSIS — K31819 Angiodysplasia of stomach and duodenum without bleeding: Secondary | ICD-10-CM

## 2021-02-17 NOTE — Progress Notes (Signed)
Hailey Antigua, MD [address]  [place], [place] [address]  Main: [phone]  Fax: [phone]   Primary Care Physician: Letta Median, MD   Chief complaint: Anemia  HPI: Hailey Farrell is a 70 y.o. female here for follow-up.  Patient was recently noted to have decline in her hemoglobin.  Reports having dizziness for the last week.  States her bowel movements are always black with oral iron supplementation, but may have noticed that it was more liquid a few days ago, just 1 episode.  Is not on oral iron anymore and has started IV iron given decline in hemoglobin.  No nausea or vomiting.  No abdominal pain.  No hematochezia.  Good appetite.  No weight loss  Hematology note from yesterday reviewed and plan was to give patient IV Venofer followed by 4 additional doses  ROS: All ROS reviewed and negative except as per HPI   Past Medical History:  Diagnosis Date  . Anemia   . Arthritis   . Breast cancer ([place]) 1990   Right  . COPD (chronic obstructive pulmonary disease) ([place])   . Dyspnea    DOE  . Elevated lipids   . Hypertension   . Lower extremity edema   . Migraines   . Migraines   . Personal history of radiation therapy 1990   Breast  . Restless leg syndrome   . Rotator cuff injury     Past Surgical History:  Procedure Laterality Date  . ABDOMINAL HYSTERECTOMY    . BACK SURGERY  08/2017   CERVICAL FUSION  . BREAST BIOPSY Right 1990   LUMPECTOMY.  took lymph nodes  . BREAST LUMPECTOMY    . COLONOSCOPY WITH PROPOFOL N/A 07/20/2017   Procedure: COLONOSCOPY WITH PROPOFOL;  Surgeon: Virgel Manifold, MD;  Location: [facility] ENDOSCOPY;  Service: Endoscopy;  Laterality: N/A;  . ENTEROSCOPY N/A 08/08/2017   Procedure: ENTEROSCOPY;  Surgeon: Lin Landsman, MD;  Location: [facility] ENDOSCOPY;  Service: Gastroenterology;  Laterality: N/A;  . ENTEROSCOPY N/A 05/07/2020   Procedure: ENTEROSCOPY with Adult colonoscope;  Surgeon: Virgel Manifold, MD;  Location: [facility] ENDOSCOPY;  Service: Endoscopy;  Laterality: N/A;  . ESOPHAGOGASTRODUODENOSCOPY Left 04/13/2017   Procedure: ESOPHAGOGASTRODUODENOSCOPY (EGD);  Surgeon: Virgel Manifold, MD;  Location: [facility] ENDOSCOPY;  Service: Endoscopy;  Laterality: Left;  . ESOPHAGOGASTRODUODENOSCOPY (EGD) WITH PROPOFOL N/A 07/20/2017   Procedure: ESOPHAGOGASTRODUODENOSCOPY (EGD) WITH PROPOFOL;  Surgeon: Virgel Manifold, MD;  Location: [facility] ENDOSCOPY;  Service: Endoscopy;  Laterality: N/A;  . ESOPHAGOGASTRODUODENOSCOPY (EGD) WITH PROPOFOL N/A 12/12/2017   Procedure: ESOPHAGOGASTRODUODENOSCOPY (EGD) WITH PROPOFOL;  Surgeon: Virgel Manifold, MD;  Location: [facility] ENDOSCOPY;  Service: Endoscopy;  Laterality: N/A;  . ESOPHAGOGASTRODUODENOSCOPY (EGD) WITH PROPOFOL N/A 12/23/2020   Procedure: ESOPHAGOGASTRODUODENOSCOPY (EGD) WITH PROPOFOL;  Surgeon: Virgel Manifold, MD;  Location: [facility] ENDOSCOPY;  Service: Endoscopy;  Laterality: N/A;  . MEDIAL PARTIAL KNEE REPLACEMENT Left 1990   torn ligament. no knee replacement at that time  . PARTIAL HYSTERECTOMY    . SHOULDER ARTHROSCOPY WITH OPEN ROTATOR CUFF REPAIR Left 03/01/2016   Procedure: SHOULDER ARTHROSCOPY WITH OPEN ROTATOR CUFF REPAIR;  Surgeon: Thornton Park, MD;  Location: [facility] ORS;  Service: Orthopedics;  Laterality: Left;  . TOTAL KNEE ARTHROPLASTY Left 02/14/2018   Procedure: TOTAL KNEE ARTHROPLASTY;  Surgeon: Thornton Park, MD;  Location: [facility] ORS;  Service: Orthopedics;  Laterality: Left;    Prior to Admission medications   Medication Sig Start Date End Date Taking? Authorizing  Provider  albuterol (VENTOLIN HFA) 108 (90 Base) MCG/ACT inhaler Inhale 2 puffs into the lungs every 6 (six) hours as needed for wheezing.   Yes [provider]  amitriptyline (ELAVIL) 25 MG tablet Take 25 mg by mouth at bedtime. 04/13/20  Yes [provider]  budesonide-formoterol (SYMBICORT) 160-4.5 MCG/ACT inhaler Inhale 2 puffs into  the lungs daily as needed (wheezing or shortness of breath).    Yes [provider]  diclofenac Sodium (VOLTAREN) 1 % GEL Apply 2 g topically daily as needed.   Yes [provider]  ferrous [id number] C-folic acid (TRINSICON / FOLTRIN) capsule Take 1 capsule by mouth 3 (three) times daily after meals. 05/08/20  Yes Lorella Nimrod, MD  furosemide (LASIX) 20 MG tablet Take 20 mg daily by mouth.    Yes [provider]  lisinopril (PRINIVIL,ZESTRIL) 20 MG tablet Take 20 mg by mouth daily.   Yes [provider]  potassium chloride (MICRO-K) 10 MEQ CR capsule Take 10 mEq by mouth daily.   Yes [provider]  rOPINIRole (REQUIP) 0.5 MG tablet Take 0.5 mg by mouth at bedtime as needed (restless leg syndrome).    Yes [provider]  simvastatin (ZOCOR) 20 MG tablet Take 20 mg by mouth daily.    Yes [provider]  SUMAtriptan (IMITREX) 6 MG/0.5ML SOLN injection Inject 6 mg into the skin every 2 (two) hours as needed for migraine or headache. May repeat in 2 hours if headache persists or recurs.   Yes [provider]  tiotropium (SPIRIVA) 18 MCG inhalation capsule Place 18 mcg into inhaler and inhale daily.   Yes [provider]  vitamin B-12 (CYANOCOBALAMIN) 1000 MCG tablet Take 1,000 mcg by mouth daily.   Yes [provider]    Family History  Problem Relation Age of Onset  . CAD Mother   . CAD Sister   . Breast cancer Neg Hx      Social History   Tobacco Use  . Smoking status: Some Days    Packs/day: 0.25    Years: 50.00    Pack years: 12.50    Types: Cigarettes    Last attempt to quit: 03/13/2016    Years since quitting: 4.9  . Smokeless tobacco: Never  Vaping Use  . Vaping Use: Never used  Substance Use Topics  . Alcohol use: No  . Drug use: No    Allergies as of 02/17/2021 - Review Complete 02/17/2021  Allergen Reaction Noted  . Latex Rash 02/03/2015    Physical  Examination:  Constitutional: General:   Alert,  Well-developed, well-nourished, pleasant and cooperative in NAD BP 128/77   Pulse (!) 103   Temp 98.2 F (36.8 C) (Oral)   Ht '5\' 4"'$  (1.626 m)   Wt 190 lb 12.8 oz (86.5 kg)   BMI 32.75 kg/m   Respiratory: Normal respiratory effort  Gastrointestinal:  Soft, non-tender and non-distended without masses, hepatosplenomegaly or hernias noted.  No guarding or rebound tenderness.     Cardiac: No clubbing or edema.  No cyanosis. Normal posterior tibial pedal pulses noted.  Psych:  Alert and cooperative. Normal mood and affect.  Musculoskeletal:  Normal gait. Head normocephalic, atraumatic. Symmetrical without gross deformities. 5/5 Lower extremity strength bilaterally.  Skin: Warm. Intact without significant lesions or rashes. No jaundice.  Neck: Supple, trachea midline  Lymph: No cervical lymphadenopathy  Psych:  Alert and oriented x3, Alert and cooperative. Normal mood and affect.  Labs: CMP     Component  Value Date/Time   NA 138 05/06/2020 0537   K 4.3 05/06/2020 0537   CL 106 05/06/2020 0537   CO2 23 05/06/2020 0537   GLUCOSE 108 (H) 05/06/2020 0537   BUN 10 05/06/2020 0537   CREATININE 0.98 05/06/2020 0537   CALCIUM 8.4 (L) 05/06/2020 0537   PROT 6.2 (L) 05/06/2020 0309   ALBUMIN 3.7 05/06/2020 0309   AST 19 05/06/2020 0309   ALT 14 05/06/2020 0309   ALKPHOS 30 (L) 05/06/2020 0309   BILITOT 0.3 05/06/2020 0309   GFRNONAA >60 05/06/2020 0537   GFRAA >60 02/16/2018 1523   Lab Results  Component Value Date   WBC 6.3 02/16/2021   HGB 8.0 (L) 02/16/2021   HCT 27.1 (L) 02/16/2021   MCV 85.2 02/16/2021   PLT 376 02/16/2021   Ferritin 13 Iron 18, saturation 4%     Assessment and Plan:   Hailey Farrell is a 71 y.o. 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  However, if this changes patient advised to call us.  If capsule study shows culprit lesion, she may need enteroscopy as necessary  Continue IV iron replacement as per hematology  Dr Hailey Farrell

## 2021-02-18 ENCOUNTER — Inpatient Hospital Stay: Payer: Medicare HMO

## 2021-02-18 ENCOUNTER — Other Ambulatory Visit: Payer: Self-pay

## 2021-02-18 VITALS — BP 112/68 | HR 92 | Temp 96.5°F | Resp 17

## 2021-02-18 DIAGNOSIS — D509 Iron deficiency anemia, unspecified: Secondary | ICD-10-CM

## 2021-02-18 MED ORDER — SODIUM CHLORIDE 0.9 % IV SOLN: 200.0000 mg | Freq: Once | INTRAVENOUS | Status: DC

## 2021-02-18 MED ORDER — SODIUM CHLORIDE 0.9 % IV SOLN
Freq: Once | INTRAVENOUS | Status: AC
Start: 2021-02-18 — End: 2021-02-18
  Filled 2021-02-18: qty 250

## 2021-02-18 MED ORDER — IRON SUCROSE 20 MG/ML IV SOLN
200.0000 mg | Freq: Once | INTRAVENOUS | Status: AC
  Administered 2021-02-18: 200 mg via INTRAVENOUS
  Filled 2021-02-18: qty 10

## 2021-02-18 NOTE — Patient Instructions (Signed)

## 2021-02-21 ENCOUNTER — Inpatient Hospital Stay: Payer: Medicare HMO

## 2021-02-21 ENCOUNTER — Other Ambulatory Visit: Payer: Self-pay

## 2021-02-21 VITALS — BP 116/60 | HR 93 | Temp 96.0°F | Resp 19

## 2021-02-21 DIAGNOSIS — D509 Iron deficiency anemia, unspecified: Secondary | ICD-10-CM

## 2021-02-21 MED ORDER — SODIUM CHLORIDE 0.9 % IV SOLN
Freq: Once | INTRAVENOUS | Status: AC
  Filled 2021-02-21: qty 250

## 2021-02-21 MED ORDER — SODIUM CHLORIDE 0.9 % IV SOLN: 200.0000 mg | Freq: Once | INTRAVENOUS | Status: DC

## 2021-02-21 MED ORDER — IRON SUCROSE 20 MG/ML IV SOLN
200.0000 mg | Freq: Once | INTRAVENOUS | Status: AC
  Administered 2021-02-21: 200 mg via INTRAVENOUS
  Filled 2021-02-21: qty 10

## 2021-02-21 NOTE — Patient Instructions (Signed)

## 2021-02-25 ENCOUNTER — Other Ambulatory Visit: Payer: Self-pay

## 2021-02-25 ENCOUNTER — Inpatient Hospital Stay: Payer: Medicare HMO

## 2021-02-25 VITALS — BP 119/70 | HR 86 | Temp 96.0°F | Resp 18

## 2021-02-25 DIAGNOSIS — D509 Iron deficiency anemia, unspecified: Secondary | ICD-10-CM | POA: Diagnosis not present

## 2021-02-25 MED ORDER — IRON SUCROSE 20 MG/ML IV SOLN
200.0000 mg | Freq: Once | INTRAVENOUS | Status: AC
  Administered 2021-02-25: 200 mg via INTRAVENOUS
  Filled 2021-02-25: qty 10

## 2021-02-25 MED ORDER — SODIUM CHLORIDE 0.9 % IV SOLN
Freq: Once | INTRAVENOUS | Status: AC
  Filled 2021-02-25: qty 250

## 2021-02-25 MED ORDER — SODIUM CHLORIDE 0.9 % IV SOLN: 200.0000 mg | Freq: Once | INTRAVENOUS | Status: DC

## 2021-02-25 NOTE — Patient Instructions (Signed)

## 2021-02-28 ENCOUNTER — Other Ambulatory Visit: Payer: Self-pay

## 2021-02-28 ENCOUNTER — Inpatient Hospital Stay: Payer: Medicare HMO

## 2021-02-28 VITALS — BP 104/67 | HR 89 | Temp 98.0°F | Resp 17

## 2021-02-28 DIAGNOSIS — D509 Iron deficiency anemia, unspecified: Secondary | ICD-10-CM | POA: Diagnosis not present

## 2021-02-28 MED ORDER — SODIUM CHLORIDE 0.9 % IV SOLN: 200.0000 mg | Freq: Once | INTRAVENOUS | Status: DC

## 2021-02-28 MED ORDER — SODIUM CHLORIDE 0.9 % IV SOLN
Freq: Once | INTRAVENOUS | Status: AC
  Filled 2021-02-28: qty 250

## 2021-02-28 MED ORDER — IRON SUCROSE 20 MG/ML IV SOLN
200.0000 mg | Freq: Once | INTRAVENOUS | Status: AC
  Administered 2021-02-28: 200 mg via INTRAVENOUS
  Filled 2021-02-28: qty 10

## 2021-02-28 NOTE — Patient Instructions (Signed)

## 2021-03-01 ENCOUNTER — Telehealth: Payer: Self-pay | Admitting: Gastroenterology

## 2021-03-01 NOTE — Telephone Encounter (Signed)
Pt. Calling her prep was not at the pharmacy and she wants to know what else can she take before her procedure.

## 2021-03-02 NOTE — Telephone Encounter (Signed)
I spoke to pt and went over instructions for the capsule study and she expressed understanding

## 2021-03-05 ENCOUNTER — Encounter: Payer: Self-pay | Admitting: Oncology

## 2021-03-10 ENCOUNTER — Ambulatory Visit
Admission: RE | Admit: 2021-03-10 | Discharge: 2021-03-10 | Disposition: A | Discharge status: Home / Self Care | Payer: Medicare Other | Attending: Gastroenterology | Admitting: Gastroenterology

## 2021-03-10 ENCOUNTER — Encounter: Payer: Self-pay | Admitting: Anesthesiology

## 2021-03-10 ENCOUNTER — Encounter
Admission: RE | Disposition: A | Discharge status: Home / Self Care | Payer: Self-pay | Source: Home / Self Care | Attending: Gastroenterology

## 2021-03-10 DIAGNOSIS — K269 Duodenal ulcer, unspecified as acute or chronic, without hemorrhage or perforation: Secondary | ICD-10-CM | POA: Insufficient documentation

## 2021-03-10 DIAGNOSIS — D509 Iron deficiency anemia, unspecified: Secondary | ICD-10-CM | POA: Diagnosis not present

## 2021-03-10 DIAGNOSIS — K6389 Other specified diseases of intestine: Secondary | ICD-10-CM | POA: Diagnosis not present

## 2021-03-10 HISTORY — PX: GIVENS CAPSULE STUDY: SHX5432

## 2021-03-10 SURGERY — IMAGING PROCEDURE, GI TRACT, INTRALUMINAL, VIA CAPSULE
Anesthesia: General

## 2021-03-10 NOTE — Anesthesia Preprocedure Evaluation (Deleted)
Anesthesia Evaluation  Patient identified by MRN, date of birth, ID band Patient awake    Reviewed: Allergy & Precautions, H&P , NPO status , Patient's Chart, lab work & pertinent test results  History of Anesthesia Complications Negative for: history of anesthetic complications  Airway Mallampati: III  TM Distance: >3 FB Neck ROM: full    Dental  (+) Chipped, Poor Dentition, Missing, Dental Advidsory Given, Partial Upper   Pulmonary shortness of breath and with exertion, COPD,  COPD inhaler, neg recent URI, Current Smoker and Patient abstained from smoking., former smoker,    Pulmonary exam normal  + decreased breath sounds      Cardiovascular Exercise Tolerance: Good hypertension, Pt. on medications (-) angina(-) Past MI Normal cardiovascular exam(-) dysrhythmias (-) Valvular Problems/Murmurs     Neuro/Psych  Headaches, neg Seizures  Neuromuscular disease negative psych ROS   GI/Hepatic Neg liver ROS, hiatal hernia, PUD, GERD  Medicated and Controlled,history of small bowel AVMs    Endo/Other  negative endocrine ROS  Renal/GU      Musculoskeletal  (+) Arthritis ,   Abdominal   Peds  Hematology  (+) Blood dyscrasia, anemia ,   Anesthesia Other Findings Anemia    Arthritis    Breast cancer ([place]) 1990 Right  COPD (chronic obstructive pulmonary disease) ([facility])    Dyspnea  DOE  Elevated lipids    Hypertension    Lower extremity edema   Migraines    Migraines    Personal history of radiation therapy 1990 Breast  Restless leg syndrome   Rotator cuff injury       Reproductive/Obstetrics negative OB ROS                             Anesthesia Physical  Anesthesia Plan  ASA: 3  Anesthesia Plan: General   Post-op Pain Management:    Induction: Intravenous  PONV Risk Score and Plan: Propofol infusion and TIVA  Airway Management Planned: Natural Airway and Nasal  Cannula  Additional Equipment:   Intra-op Plan:   Post-operative Plan:   Informed Consent: I have reviewed the patients History and Physical, chart, labs and discussed the procedure including the risks, benefits and alternatives for the proposed anesthesia with the patient or authorized representative who has indicated his/her understanding and acceptance.     Dental Advisory Given  Plan Discussed with: Anesthesiologist, CRNA and Surgeon  Anesthesia Plan Comments: (Patient consented for risks of anesthesia including but not limited to:  - adverse reactions to medications - damage to teeth, lips or other oral mucosa - sore throat or hoarseness - Damage to heart, brain, lungs or loss of life  Patient voiced understanding.)        Anesthesia Quick Evaluation

## 2021-03-11 ENCOUNTER — Encounter: Payer: Self-pay | Admitting: Gastroenterology

## 2021-03-11 ENCOUNTER — Other Ambulatory Visit
Admission: RE | Admit: 2021-03-11 | Discharge: 2021-03-11 | Disposition: A | Discharge status: Home / Self Care | Payer: Medicare Other | Source: Home / Self Care | Attending: Gastroenterology | Admitting: Gastroenterology

## 2021-03-11 ENCOUNTER — Ambulatory Visit
Admission: RE | Admit: 2021-03-11 | Discharge: 2021-03-11 | Disposition: A | Discharge status: Home / Self Care | Payer: Medicare Other | Attending: Gastroenterology | Admitting: Gastroenterology

## 2021-03-11 ENCOUNTER — Encounter: Payer: Self-pay | Admitting: Oncology

## 2021-03-11 ENCOUNTER — Ambulatory Visit
Admission: RE | Admit: 2021-03-11 | Discharge: 2021-03-11 | Disposition: A | Discharge status: Home / Self Care | Payer: Medicare Other | Source: Ambulatory Visit | Attending: Gastroenterology | Admitting: Gastroenterology

## 2021-03-11 ENCOUNTER — Telehealth: Payer: Self-pay

## 2021-03-11 ENCOUNTER — Other Ambulatory Visit: Payer: Self-pay

## 2021-03-11 DIAGNOSIS — T189XXA Foreign body of alimentary tract, part unspecified, initial encounter: Secondary | ICD-10-CM

## 2021-03-11 DIAGNOSIS — D509 Iron deficiency anemia, unspecified: Secondary | ICD-10-CM

## 2021-03-11 LAB — HEMOGLOBIN: Hemoglobin: 8.3 g/dL — ABNORMAL LOW (ref 12.0–15.0)

## 2021-03-11 NOTE — Telephone Encounter (Signed)
Hailey Farrell, can you call this pt and have her do an abdominal x-ray and a Hemoglobin today. Abdominal x-ray 2 view, there should be an indication that comes up that says "foreign body alimentary tract". In comments put - evaluate if small bowel capsule has exited the small bowel.   It is not clear on the capsule study if it reached the colon or not due to stool present toward the end of the exam  Tell her there was no red blood seen on the exam, but dark green or black liquid was seen in her small bowel. The hemoglobin will help [place] seen if her anemia has improved or worsened. If not improving we may do EGD or enteroscopy next week   Pt is aware and states she will go to the hospital now

## 2021-03-11 NOTE — Progress Notes (Signed)
Capsule report has been sent for scanning:  Findings: Landmarks: First Gastric Image 00:00:27 First Duodenal Image 00:03:16  Findings: [place] or slightly red appearing material was seen in the distal stomach, but no clearly bleeding lesions were seen. Pt had a recent EGD that evaluated this area  A duodenal erosion was noted.   A lymphangiectasia was seen in the small bowel  Dark green to black appearing liquid was seen in the small bowel. There are images that show the liquid to be dark green more than black, therefore unclear if this represents melena or not. Dark green color would suggest against melena.  It was difficult to see if the last image was in the colon due to presence of liquid stool  Recommendations:  Obtain Abdominal X-ray to ensure capsule has exited the small bowel  Obtain Hemoglobin today  If Hemoglobin is not improving consider EGD vs push enteroscopy due to presence of dark green to black liquid in the small bowel

## 2021-03-14 ENCOUNTER — Other Ambulatory Visit: Payer: Self-pay

## 2021-03-14 DIAGNOSIS — D509 Iron deficiency anemia, unspecified: Secondary | ICD-10-CM

## 2021-03-17 ENCOUNTER — Ambulatory Visit: Payer: Medicare Other | Admitting: Anesthesiology

## 2021-03-17 ENCOUNTER — Ambulatory Visit
Admission: RE | Admit: 2021-03-17 | Discharge: 2021-03-17 | Disposition: A | Discharge status: Home / Self Care | Payer: Medicare Other | Attending: Gastroenterology | Admitting: Gastroenterology

## 2021-03-17 ENCOUNTER — Encounter
Admission: RE | Disposition: A | Discharge status: Home / Self Care | Payer: Self-pay | Source: Home / Self Care | Attending: Gastroenterology

## 2021-03-17 ENCOUNTER — Ambulatory Visit: Payer: Medicare Other

## 2021-03-17 ENCOUNTER — Encounter: Payer: Self-pay | Admitting: Gastroenterology

## 2021-03-17 DIAGNOSIS — K319 Disease of stomach and duodenum, unspecified: Secondary | ICD-10-CM

## 2021-03-17 DIAGNOSIS — Z79899 Other long term (current) drug therapy: Secondary | ICD-10-CM | POA: Insufficient documentation

## 2021-03-17 DIAGNOSIS — K2980 Duodenitis without bleeding: Secondary | ICD-10-CM | POA: Diagnosis not present

## 2021-03-17 DIAGNOSIS — Z791 Long term (current) use of non-steroidal anti-inflammatories (NSAID): Secondary | ICD-10-CM | POA: Insufficient documentation

## 2021-03-17 DIAGNOSIS — K31A11 Gastric intestinal metaplasia without dysplasia, involving the antrum: Secondary | ICD-10-CM | POA: Diagnosis not present

## 2021-03-17 DIAGNOSIS — F17210 Nicotine dependence, cigarettes, uncomplicated: Secondary | ICD-10-CM | POA: Insufficient documentation

## 2021-03-17 DIAGNOSIS — R52 Pain, unspecified: Secondary | ICD-10-CM

## 2021-03-17 DIAGNOSIS — Z91040 Latex allergy status: Secondary | ICD-10-CM | POA: Insufficient documentation

## 2021-03-17 DIAGNOSIS — K2289 Other specified disease of esophagus: Secondary | ICD-10-CM | POA: Diagnosis not present

## 2021-03-17 DIAGNOSIS — Z7951 Long term (current) use of inhaled steroids: Secondary | ICD-10-CM | POA: Insufficient documentation

## 2021-03-17 DIAGNOSIS — D509 Iron deficiency anemia, unspecified: Secondary | ICD-10-CM | POA: Diagnosis present

## 2021-03-17 DIAGNOSIS — Z923 Personal history of irradiation: Secondary | ICD-10-CM | POA: Insufficient documentation

## 2021-03-17 DIAGNOSIS — K449 Diaphragmatic hernia without obstruction or gangrene: Secondary | ICD-10-CM | POA: Diagnosis not present

## 2021-03-17 DIAGNOSIS — K31819 Angiodysplasia of stomach and duodenum without bleeding: Secondary | ICD-10-CM | POA: Diagnosis not present

## 2021-03-17 DIAGNOSIS — Z8249 Family history of ischemic heart disease and other diseases of the circulatory system: Secondary | ICD-10-CM | POA: Diagnosis not present

## 2021-03-17 HISTORY — PX: ENTEROSCOPY: SHX5533

## 2021-03-17 SURGERY — ENTEROSCOPY
Anesthesia: General

## 2021-03-17 MED ORDER — PROPOFOL 500 MG/50ML IV EMUL
INTRAVENOUS | Status: AC
  Filled 2021-03-17: qty 50

## 2021-03-17 MED ORDER — LIDOCAINE HCL (CARDIAC) PF 100 MG/5ML IV SOSY
PREFILLED_SYRINGE | INTRAVENOUS | Status: DC | PRN
  Administered 2021-03-17: 50 mg via INTRAVENOUS

## 2021-03-17 MED ORDER — FENTANYL CITRATE PF 50 MCG/ML IJ SOSY
PREFILLED_SYRINGE | INTRAMUSCULAR | Status: AC
  Administered 2021-03-17: 50 ug via INTRAVENOUS
  Filled 2021-03-17: qty 1

## 2021-03-17 MED ORDER — PROPOFOL 500 MG/50ML IV EMUL
INTRAVENOUS | Status: DC | PRN
  Administered 2021-03-17: 150 ug/kg/min via INTRAVENOUS

## 2021-03-17 MED ORDER — ONDANSETRON HCL 4 MG/2ML IJ SOLN
INTRAMUSCULAR | Status: AC
  Filled 2021-03-17: qty 2

## 2021-03-17 MED ORDER — SODIUM CHLORIDE 0.9 % IV SOLN
INTRAVENOUS | Status: DC
  Administered 2021-03-17: 1000 mL via INTRAVENOUS

## 2021-03-17 NOTE — Anesthesia Preprocedure Evaluation (Signed)
Anesthesia Evaluation  Patient identified by MRN, date of birth, ID band Patient awake    Reviewed: Allergy & Precautions, H&P , NPO status , Patient's Chart, lab work & pertinent test results  History of Anesthesia Complications Negative for: history of anesthetic complications  Airway Mallampati: III  TM Distance: >3 FB Neck ROM: full    Dental  (+) Chipped, Poor Dentition, Missing, Dental Advidsory Given, Partial Upper   Pulmonary shortness of breath and with exertion, COPD,  COPD inhaler, neg recent URI, Current Smoker and Patient abstained from smoking., former smoker,    Pulmonary exam normal  + decreased breath sounds      Cardiovascular Exercise Tolerance: Good hypertension, Pt. on medications (-) angina(-) Past MI Normal cardiovascular exam(-) dysrhythmias (-) Valvular Problems/Murmurs     Neuro/Psych  Headaches, neg Seizures  Neuromuscular disease negative psych ROS   GI/Hepatic Neg liver ROS, hiatal hernia, PUD, GERD  Medicated and Controlled,  Endo/Other  negative endocrine ROS  Renal/GU      Musculoskeletal  (+) Arthritis ,   Abdominal   Peds  Hematology  (+) Blood dyscrasia, anemia ,   Anesthesia Other Findings Anemia    Arthritis    Breast cancer ([place]) 1990 Right  COPD (chronic obstructive pulmonary disease) ([facility])    Dyspnea  DOE  Elevated lipids    Hypertension    Lower extremity edema   Migraines    Migraines    Personal history of radiation therapy 1990 Breast  Restless leg syndrome   Rotator cuff injury       Reproductive/Obstetrics negative OB ROS                             Anesthesia Physical  Anesthesia Plan  ASA: 3  Anesthesia Plan: General   Post-op Pain Management:    Induction: Intravenous  PONV Risk Score and Plan: 2 and Propofol infusion and TIVA  Airway Management Planned: Natural Airway and Nasal Cannula  Additional Equipment:    Intra-op Plan:   Post-operative Plan:   Informed Consent: I have reviewed the patients History and Physical, chart, labs and discussed the procedure including the risks, benefits and alternatives for the proposed anesthesia with the patient or authorized representative who has indicated his/her understanding and acceptance.     Dental Advisory Given  Plan Discussed with: Anesthesiologist, CRNA and Surgeon  Anesthesia Plan Comments: (Patient declines spinal and requests GA  Patient consented for risks of anesthesia including but not limited to:  - adverse reactions to medications - damage to teeth, lips or other oral mucosa - sore throat or hoarseness - Damage to heart, brain, lungs or loss of life  Patient voiced understanding.)       Anesthesia Quick Evaluation

## 2021-03-17 NOTE — Op Note (Signed)
[facility] Gastroenterology Patient Name: Hailey Farrell Procedure Date: 03/17/2021 11:19 AM MRN: [id number] Account #: [id number] Date of Birth: Nov 26, 1950 Admit Type: Outpatient Age: 70 Room: [facility] ENDO ROOM 3 Gender: Female Note Status: Finalized Instrument Name: Peds Colonoscope [id number] Procedure:             Small bowel enteroscopy Indications:           Iron deficiency anemia Providers:             Hayly Litsey B. Bonna Gains MD, MD Referring MD:          Bridget Hartshorn j. [facility], dr (Referring MD) Medicines:             General Anesthesia Complications:         No immediate complications. Procedure:             Pre-Anesthesia Assessment:                        - Prior to the procedure, a History and Physical was                         performed, and patient medications and allergies were                         reviewed. The patient is competent. The risks and                         benefits of the procedure and the sedation options and                         risks were discussed with the patient. All questions                         were answered and informed consent was obtained.                         Patient identification and proposed procedure were                         verified by the physician, the nurse, the                         anesthesiologist, the anesthetist and the technician                         in the pre-procedure area. Mental Status Examination:                         alert and oriented. Airway Examination: normal                         oropharyngeal airway and neck mobility. Respiratory                         Examination: clear to auscultation. CV Examination:                         normal. Prophylactic Antibiotics: The patient does not  require prophylactic antibiotics. Prior                         Anticoagulants: The patient has taken no previous                         anticoagulant or antiplatelet  agents. ASA Grade                         Assessment: II - A patient with mild systemic disease.                         After reviewing the risks and benefits, the patient                         was deemed in satisfactory condition to undergo the                         procedure. The anesthesia plan was to use general                         anesthesia. Immediately prior to administration of                         medications, the patient was re-assessed for adequacy                         to receive sedatives. The heart rate, respiratory                         rate, oxygen saturations, blood pressure, adequacy of                         pulmonary ventilation, and response to care were                         monitored throughout the procedure. The physical                         status of the patient was re-assessed after the                         procedure.                        After obtaining informed consent, the endoscope was                         passed under direct vision. Throughout the procedure,                         the patient's blood pressure, pulse, and oxygen                         saturations were monitored continuously. The                         Colonoscope was introduced through the mouth and  advanced to the jejunum, to the 160 cm mark (from the                         incisors). The small bowel enteroscopy was                         accomplished with ease. The patient tolerated the                         procedure well. Findings:      The examined esophagus was normal.      Patchy mildly erythematous mucosa without bleeding was found in the       gastric antrum. Biopsies were obtained in the gastric body, at the       incisura and in the gastric antrum with cold forceps for Helicobacter       pylori testing.      A hiatal hernia was present.      The exam of the stomach was otherwise normal.      Nodular mucosa was found in  the duodenal bulb. Biopsies were taken with       a cold forceps for histology.      A single angioectasia with no bleeding was found in the duodenal bulb.       Coagulation for bleeding prevention using argon plasma was successful.      The exam of the duodenum was otherwise normal.      A few angioectasias with no bleeding were found in the entire examined       portion of jejunum. Coagulation for bleeding prevention using argon       plasma was successful.      Exam of the jejunum was otherwise normal. Impression:            - Normal esophagus.                        - Erythematous mucosa in the antrum.                        - Hiatal hernia.                        - Nodular mucosa in the duodenal bulb. Biopsied.                        - A single non-bleeding angioectasia in the duodenum.                         Treated with argon plasma coagulation (APC).                        - A few non-bleeding angioectasias in the jejunum.                         Treated with argon plasma coagulation (APC).                        - Biopsies were obtained in the gastric body, at the                         incisura and in the  gastric antrum. Recommendation:        - Await pathology results.                        - Continue present medications.                        - Resume previous diet.                        - Return to my office as previously scheduled.                        - Return to primary care physician as previously                         scheduled.                        - The findings and recommendations were discussed with                         the patient.                        - The findings and recommendations were discussed with                         the patient's family. Procedure Code(s):     --- Professional ---                        [phone], 76, Small intestinal endoscopy, enteroscopy                         beyond second portion of duodenum, not including                          ileum; with control of bleeding (eg, injection,                         bipolar cautery, unipolar cautery, laser, heater                         probe, stapler, plasma coagulator)                        44361, Small intestinal endoscopy, enteroscopy beyond                         second portion of duodenum, not including ileum; with                         biopsy, single or multiple Diagnosis Code(s):     --- Professional ---                        K31.89, Other diseases of stomach and duodenum                        K44.9, Diaphragmatic hernia without obstruction or  gangrene                        K31.819, Angiodysplasia of stomach and duodenum                         without bleeding                        K55.20, Angiodysplasia of colon without hemorrhage                        D50.9, Iron deficiency anemia, unspecified CPT copyright 2019 American Medical Association. All rights reserved. The codes documented in this report are preliminary and upon coder review may  be revised to meet current compliance requirements.  Vonda Antigua, MD Margretta Sidle B. Bonna Gains MD, MD 03/17/2021 12:11:44 PM This report has been signed electronically. Number of Addenda: 0 Note Initiated On: 03/17/2021 11:19 AM Estimated Blood Loss:  Estimated blood loss: none.      [facility]

## 2021-03-17 NOTE — Anesthesia Procedure Notes (Signed)
Date/Time: 03/17/2021 11:28 AM Performed by: Vaughan Sine Pre-anesthesia Checklist: Patient identified, Emergency Drugs available, Suction available, Patient being monitored and Timeout performed Patient Re-evaluated:Patient Re-evaluated prior to induction Oxygen Delivery Method: Simple face mask Preoxygenation: Pre-oxygenation with 100% oxygen Induction Type: IV induction Placement Confirmation: positive ETCO2 and CO2 detector

## 2021-03-17 NOTE — Anesthesia Postprocedure Evaluation (Signed)
Anesthesia Post Note  Patient: Hailey Farrell  Procedure(s) Performed: ENTEROSCOPY  Patient location during evaluation: Phase II Anesthesia Type: General Level of consciousness: awake and alert, awake and oriented Pain management: pain level controlled Vital Signs Assessment: post-procedure vital signs reviewed and stable Respiratory status: spontaneous breathing, nonlabored ventilation and respiratory function stable Cardiovascular status: blood pressure returned to baseline and stable Postop Assessment: no apparent nausea or vomiting Anesthetic complications: no   No notable events documented.   Last Vitals:  Vitals:   03/17/21 1310 03/17/21 1320  BP: (!) 148/80 (!) 148/90  Pulse: 81 80  Resp: (!) 21 18  Temp:    SpO2: (!) 87% 100%    Last Pain:  Vitals:   03/17/21 1320  TempSrc:   PainSc: 5                  Phill Mutter

## 2021-03-17 NOTE — OR Nursing (Signed)
Pt experiencing severe retching and nausea. No emesis.pt also experiencing severe umbilical pain post op. DR Bonna Gains notified. MD orders stat KUB

## 2021-03-17 NOTE — Transfer of Care (Signed)
Immediate Anesthesia Transfer of Care Note  Patient: Hailey Farrell  Procedure(s) Performed: ENTEROSCOPY  Patient Location: PACU  Anesthesia Type:General  Level of Consciousness: awake and sedated  Airway & Oxygen Therapy: Patient Spontanous Breathing and Patient connected to nasal cannula oxygen  Post-op Assessment: Report given to RN and Post -op Vital signs reviewed and stable  Post vital signs: Reviewed and stable  Last Vitals:  Vitals Value Taken Time  BP    Temp    Pulse    Resp    SpO2      Last Pain:  Vitals:   03/17/21 1048  TempSrc: Temporal  PainSc: 0-No pain         Complications: No notable events documented.

## 2021-03-17 NOTE — H&P (Signed)
Vonda Antigua, MD [address], [place], [place], [place], [address] [address] [place], [place], [place], [address] Phone: [phone]  Fax: [phone]  Primary Care Physician:  Letta Median, MD   Pre-Procedure History & Physical: HPI:  Hailey Farrell is a 70 y.o. female is here for a push enteroscopy   Past Medical History:  Diagnosis Date   Anemia    Arthritis    Breast cancer ([place]) 1990   Right   COPD (chronic obstructive pulmonary disease) ([place])    Dyspnea    DOE   Elevated lipids    Hypertension    Lower extremity edema    Migraines    Migraines    Personal history of radiation therapy 1990   Breast   Restless leg syndrome    Rotator cuff injury     Past Surgical History:  Procedure Laterality Date   ABDOMINAL HYSTERECTOMY     BACK SURGERY  08/2017   CERVICAL FUSION   BREAST BIOPSY Right 1990   LUMPECTOMY.  took lymph nodes   BREAST LUMPECTOMY     COLONOSCOPY WITH PROPOFOL N/A 07/20/2017   Procedure: COLONOSCOPY WITH PROPOFOL;  Surgeon: Virgel Manifold, MD;  Location: [facility] ENDOSCOPY;  Service: Endoscopy;  Laterality: N/A;   ENTEROSCOPY N/A 08/08/2017   Procedure: ENTEROSCOPY;  Surgeon: Lin Landsman, MD;  [facility] ENDOSCOPY;  Service: Gastroenterology;  Laterality: N/A;   ENTEROSCOPY N/A 05/07/2020   Procedure: ENTEROSCOPY with Adult colonoscope;  Surgeon: Virgel Manifold, MD;  Location: [facility] ENDOSCOPY;  Service: Endoscopy;  Laterality: N/A;   ESOPHAGOGASTRODUODENOSCOPY Left 04/13/2017   Procedure: ESOPHAGOGASTRODUODENOSCOPY (EGD);  Surgeon: Virgel Manifold, MD;  Location: [facility] ENDOSCOPY;  Service: Endoscopy;  Laterality: Left;   ESOPHAGOGASTRODUODENOSCOPY (EGD) WITH PROPOFOL N/A 07/20/2017   Procedure: ESOPHAGOGASTRODUODENOSCOPY (EGD) WITH PROPOFOL;  Surgeon: Virgel Manifold, MD;  Location: [facility] ENDOSCOPY;  Service: Endoscopy;  Laterality: N/A;   ESOPHAGOGASTRODUODENOSCOPY (EGD) WITH PROPOFOL N/A 12/12/2017    Procedure: ESOPHAGOGASTRODUODENOSCOPY (EGD) WITH PROPOFOL;  Surgeon: Virgel Manifold, MD;  Location: [facility] ENDOSCOPY;  Service: Endoscopy;  Laterality: N/A;   ESOPHAGOGASTRODUODENOSCOPY (EGD) WITH PROPOFOL N/A 12/23/2020   Procedure: ESOPHAGOGASTRODUODENOSCOPY (EGD) WITH PROPOFOL;  Surgeon: Virgel Manifold, MD;  Location: [facility] ENDOSCOPY;  Service: Endoscopy;  Laterality: N/A;   GIVENS CAPSULE STUDY N/A 03/10/2021   Procedure: GIVENS CAPSULE STUDY;  Surgeon: Virgel Manifold, MD;  Location: [facility] ENDOSCOPY;  Service: Endoscopy;  Laterality: N/A;   MEDIAL PARTIAL KNEE REPLACEMENT Left 1990   torn ligament. no knee replacement at that time   PARTIAL HYSTERECTOMY     SHOULDER ARTHROSCOPY WITH OPEN ROTATOR CUFF REPAIR Left 03/01/2016   Procedure: SHOULDER ARTHROSCOPY WITH OPEN ROTATOR CUFF REPAIR;  Surgeon: Thornton Park, MD;  Location: [facility] ORS;  Service: Orthopedics;  Laterality: Left;   TOTAL KNEE ARTHROPLASTY Left 02/14/2018   Procedure: TOTAL KNEE ARTHROPLASTY;  Surgeon: Thornton Park, MD;  Location: [facility] ORS;  Service: Orthopedics;  Laterality: Left;    Prior to Admission medications   Medication Sig Start Date End Date Taking? Authorizing Provider  budesonide-formoterol (SYMBICORT) 160-4.5 MCG/ACT inhaler Inhale 2 puffs into the lungs daily as needed (wheezing or shortness of breath).    Yes [provider]  ferrous [id number] C-folic acid (TRINSICON / FOLTRIN) capsule Take 1 capsule by mouth 3 (three) times daily after meals. 05/08/20  Yes Lorella Nimrod, MD  furosemide (LASIX) 20 MG tablet Take 20 mg daily by mouth.    Yes [provider]  lisinopril (PRINIVIL,ZESTRIL)  20 MG tablet Take 20 mg by mouth daily.   Yes [provider]  potassium chloride (MICRO-K) 10 MEQ CR capsule Take 10 mEq by mouth daily.   Yes [provider]  rOPINIRole (REQUIP) 0.5 MG tablet Take 0.5 mg by mouth at bedtime as needed (restless leg syndrome).     Yes [provider]  simvastatin (ZOCOR) 20 MG tablet Take 20 mg by mouth daily.    Yes [provider]  tiotropium (SPIRIVA) 18 MCG inhalation capsule Place 18 mcg into inhaler and inhale daily.   Yes [provider]  vitamin B-12 (CYANOCOBALAMIN) 1000 MCG tablet Take 1,000 mcg by mouth daily.   Yes [provider]  albuterol (VENTOLIN HFA) 108 (90 Base) MCG/ACT inhaler Inhale 2 puffs into the lungs every 6 (six) hours as needed for wheezing.    [provider]  amitriptyline (ELAVIL) 25 MG tablet Take 25 mg by mouth at bedtime. 04/13/20   [provider]  diclofenac Sodium (VOLTAREN) 1 % GEL Apply topically daily as needed.    [provider]  SUMAtriptan (IMITREX) 6 MG/0.5ML SOLN injection Inject 6 mg into the skin every 2 (two) hours as needed for migraine or headache. May repeat in 2 hours if headache persists or recurs.    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  Tobacco Use   Smoking status: Some Days    Packs/day: 0.25    Years: 50.00    Pack years: 12.50    Types: Cigarettes    Last attempt to quit: 03/13/2016    Years since quitting: 5.0   Smokeless tobacco: Never  Vaping Use   Vaping Use: Never used  Substance and Sexual Activity   Alcohol use: No   Drug use: No   Sexual activity: Not on file  Other Topics Concern   Not on file  Social History Narrative   Not on file   Social Determinants of Health   Financial Resource Strain: Not on  file  Food Insecurity: Not on file  Transportation Needs: Not on file  Physical Activity: Not on file  Stress: Not on file  Social Connections: Not on file  Intimate Partner Violence: Not on file    Review of Systems: See HPI, otherwise negative ROS  Constitutional: General:   Alert,  Well-developed, well-nourished, pleasant and cooperative in NAD BP (!) 133/50   Pulse 84   Temp (!) 96.1 F (35.6 C) (Temporal)   Resp 18   Ht 5\' 4"  (1.626 m)   Wt 87.3 kg   SpO2 100%   BMI 33.02 kg/m   Head: Normocephalic, atraumatic.   Eyes:  Sclera clear, no icterus.   Conjunctiva pink.   Mouth:  No deformity or lesions, oropharynx pink & moist.  Neck:  Supple, trachea midline  Respiratory: Normal respiratory effort  Gastrointestinal:  Soft, non-tender and non-distended without masses, hepatosplenomegaly or hernias noted.  No guarding or rebound tenderness.     Cardiac: No clubbing or edema.  No cyanosis. Normal posterior tibial pedal pulses noted.  Lymphatic:  No significant cervical adenopathy.  Psych:  Alert and cooperative. Normal mood and affect.  Musculoskeletal:   Symmetrical without gross deformities. 5/5 Lower extremity strength bilaterally.  Skin: Warm. Intact without significant lesions or rashes. No jaundice.  Neurologic:  Face symmetrical, tongue midline, Normal sensation to touch;  grossly normal neurologically.  Psych:  Alert and oriented x3, Alert and cooperative. Normal mood and affect.  Impression/Plan: Hailey Farrell is here for a push enteroscopy for iron deficiency anemia  Risks, benefits, limitations, and alternatives regarding the procedure have been reviewed with the patient.  Questions have been answered.  All parties agreeable.   Virgel Manifold, MD  03/17/2021, 11:32 AM

## 2021-03-17 NOTE — Progress Notes (Signed)
Pt reported abdominal pain postprocedure.  I examined the patient and abdomen was soft.  Pain was suspected to be due to likely gaseous distention from using air during enteroscopy.  Abdominal x-ray was obtained and reported diffuse gaseous distention of small and large bowel with some dilated loops.  Since the x-ray was performed, patient had multiple episodes of flatus that were heard by her nurse as well.  Patient reported feeling significantly better since then.  She was examined again and reported no further abdominal pain, and abdomen felt less distended and was soft.  Therefore patient was discharged and was instructed to call us if symptoms return

## 2021-03-18 ENCOUNTER — Encounter: Payer: Self-pay | Admitting: Gastroenterology

## 2021-03-18 LAB — SURGICAL PATHOLOGY

## 2021-03-31 ENCOUNTER — Encounter: Payer: Self-pay | Admitting: Oncology

## 2021-03-31 ENCOUNTER — Other Ambulatory Visit: Payer: Self-pay

## 2021-03-31 ENCOUNTER — Ambulatory Visit (INDEPENDENT_AMBULATORY_CARE_PROVIDER_SITE_OTHER): Payer: Medicare Other | Admitting: Gastroenterology

## 2021-03-31 ENCOUNTER — Encounter: Payer: Self-pay | Admitting: Gastroenterology

## 2021-03-31 VITALS — BP 133/77 | HR 92 | Temp 97.7°F | Wt 191.0 lb

## 2021-03-31 DIAGNOSIS — D509 Iron deficiency anemia, unspecified: Secondary | ICD-10-CM | POA: Diagnosis not present

## 2021-03-31 DIAGNOSIS — Q2730 Arteriovenous malformation, site unspecified: Secondary | ICD-10-CM | POA: Diagnosis not present

## 2021-03-31 NOTE — Addendum Note (Signed)
Addended by: Lurlean Nanny on: 03/31/2021 04:35 PM   Modules accepted: Orders

## 2021-03-31 NOTE — Progress Notes (Signed)
Vonda Antigua, MD [address]  [place]  [place], [place] [address]  Main: [phone]  Fax: [phone]   Primary Care Physician: Letta Median, MD   Chief complaint: Anemia  HPI: Hailey Farrell is a 70 y.o. female here for follow-up of iron deficiency anemia and AVMs.  Underwent push enteroscopy 2 weeks ago with AVMs seen and treated.  Denies any blood in stool.  Denies any melena.  No abdominal pain, nausea or vomiting.  Good appetite.  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       ROS: All ROS reviewed and negative except as per HPI   Past Medical History:  Diagnosis Date  . Anemia   . Arthritis   . Breast cancer ([place]) 1990   Right  . COPD (chronic obstructive pulmonary disease) ([place])   . Dyspnea    DOE  . Elevated lipids   . Hypertension   . Lower extremity edema   . Migraines   . Migraines   . Personal history of radiation therapy 1990   Breast  . Restless leg syndrome   . Rotator cuff injury     Past Surgical History:  Procedure Laterality Date  . ABDOMINAL HYSTERECTOMY    . BACK SURGERY  08/2017   CERVICAL FUSION  . BREAST BIOPSY Right 1990   LUMPECTOMY.  took lymph nodes  . BREAST LUMPECTOMY    . COLONOSCOPY WITH PROPOFOL N/A 07/20/2017   Procedure: COLONOSCOPY WITH PROPOFOL;  Surgeon: Virgel Manifold, MD;  Location: [facility] ENDOSCOPY;  Service: Endoscopy;  Laterality: N/A;  . ENTEROSCOPY N/A 08/08/2017   Procedure: ENTEROSCOPY;  Surgeon: Lin Landsman, MD;  Location: [facility] ENDOSCOPY;  Service: Gastroenterology;  Laterality: N/A;  . ENTEROSCOPY N/A 05/07/2020   Procedure: ENTEROSCOPY with Adult colonoscope;  Surgeon: Virgel Manifold, MD;  Location: [facility] ENDOSCOPY;  Service: Endoscopy;  Laterality: N/A;  . ENTEROSCOPY N/A 03/17/2021   Procedure: ENTEROSCOPY;  Surgeon: Virgel Manifold, MD;  Location: [facility] ENDOSCOPY;  Service:  Endoscopy;  Laterality: N/A;  PUSH  . ESOPHAGOGASTRODUODENOSCOPY Left 04/13/2017   Procedure: ESOPHAGOGASTRODUODENOSCOPY (EGD);  Surgeon: Virgel Manifold, MD;  Location: [facility] ENDOSCOPY;  Service: Endoscopy;  Laterality: Left;  . ESOPHAGOGASTRODUODENOSCOPY (EGD) WITH PROPOFOL N/A 07/20/2017   Procedure: ESOPHAGOGASTRODUODENOSCOPY (EGD) WITH PROPOFOL;  Surgeon: Virgel Manifold, MD;  Location: [facility] ENDOSCOPY;  Service: Endoscopy;  Laterality: N/A;  . ESOPHAGOGASTRODUODENOSCOPY (EGD) WITH PROPOFOL N/A 12/12/2017   Procedure: ESOPHAGOGASTRODUODENOSCOPY (EGD) WITH PROPOFOL;  Surgeon: Virgel Manifold, MD;  Location: [facility] ENDOSCOPY;  Service: Endoscopy;  Laterality: N/A;  . ESOPHAGOGASTRODUODENOSCOPY (EGD) WITH PROPOFOL N/A 12/23/2020   Procedure: ESOPHAGOGASTRODUODENOSCOPY (EGD) WITH PROPOFOL;  Surgeon: Virgel Manifold, MD;  Location: [facility] ENDOSCOPY;  Service: Endoscopy;  Laterality: N/A;  . GIVENS CAPSULE STUDY N/A 03/10/2021   Procedure: GIVENS CAPSULE STUDY;  Surgeon: Virgel Manifold, MD;  Location: [facility] ENDOSCOPY;  Service: Endoscopy;  Laterality: N/A;  . MEDIAL PARTIAL KNEE REPLACEMENT Left 1990   torn ligament. no knee replacement at that time  . PARTIAL HYSTERECTOMY    . SHOULDER ARTHROSCOPY WITH OPEN ROTATOR CUFF REPAIR Left 03/01/2016   Procedure: SHOULDER ARTHROSCOPY WITH OPEN ROTATOR CUFF REPAIR;  Surgeon: Thornton Park, MD;  Location: [facility] ORS;  Service: Orthopedics;  Laterality: Left;  . TOTAL KNEE ARTHROPLASTY Left 02/14/2018   Procedure: TOTAL KNEE ARTHROPLASTY;  Surgeon: Thornton Park, MD;  Location: [facility] ORS;  Service: Orthopedics;  Laterality: Left;  Prior to Admission medications   Medication Sig Start Date End Date Taking? Authorizing Provider  albuterol (VENTOLIN HFA) 108 (90 Base) MCG/ACT inhaler Inhale 2 puffs into the lungs every 6 (six) hours as needed for wheezing.   Yes [provider]  amitriptyline (ELAVIL) 25 MG tablet Take 25 mg  by mouth at bedtime. 04/13/20  Yes [provider]  budesonide-formoterol (SYMBICORT) 160-4.5 MCG/ACT inhaler Inhale 2 puffs into the lungs daily as needed (wheezing or shortness of breath).    Yes [provider]  diclofenac Sodium (VOLTAREN) 1 % GEL Apply topically daily as needed.   Yes [provider]  ferrous [id number] C-folic acid (TRINSICON / FOLTRIN) capsule Take 1 capsule by mouth 3 (three) times daily after meals. 05/08/20  Yes Lorella Nimrod, MD  furosemide (LASIX) 20 MG tablet Take 20 mg daily by mouth.    Yes [provider]  lisinopril (PRINIVIL,ZESTRIL) 20 MG tablet Take 20 mg by mouth daily.   Yes [provider]  potassium chloride (MICRO-K) 10 MEQ CR capsule Take 10 mEq by mouth daily.   Yes [provider]  rOPINIRole (REQUIP) 0.5 MG tablet Take 0.5 mg by mouth at bedtime as needed (restless leg syndrome).    Yes [provider]  simvastatin (ZOCOR) 20 MG tablet Take 20 mg by mouth daily.    Yes [provider]  SUMAtriptan (IMITREX) 6 MG/0.5ML SOLN injection Inject 6 mg into the skin every 2 (two) hours as needed for migraine or headache. May repeat in 2 hours if headache persists or recurs.   Yes [provider]  tiotropium (SPIRIVA) 18 MCG inhalation capsule Place 18 mcg into inhaler and inhale daily.   Yes [provider]  vitamin B-12 (CYANOCOBALAMIN) 1000 MCG tablet Take 1,000 mcg by mouth daily.   Yes [provider]    Family History  Problem Relation Age of Onset  . CAD Mother   . CAD Sister   . Breast cancer Neg Hx      Social History   Tobacco Use  . Smoking status: Some Days    Packs/day: 0.25    Years: 50.00    Pack years: 12.50    Types: Cigarettes    Last attempt to quit: 03/13/2016    Years since quitting: 5.0  . Smokeless tobacco: Never  Vaping Use  . Vaping Use: Never used  Substance Use Topics  . Alcohol use: No  . Drug use: No     Allergies as of 03/31/2021 - Review Complete 03/31/2021  Allergen Reaction Noted  . Latex Rash 02/03/2015    Physical Examination:  Constitutional: General:   Alert,  Well-developed, well-nourished, pleasant and cooperative in NAD BP 133/77   Pulse 92   Temp 97.7 F (36.5 C) (Oral)   Wt 191 lb (86.6 kg)   BMI 32.79 kg/m   Respiratory: Normal respiratory effort  Gastrointestinal:  Soft, non-tender and non-distended without masses, hepatosplenomegaly or hernias noted.  No guarding or rebound tenderness.     Cardiac: No clubbing or edema.  No cyanosis. Normal posterior tibial pedal pulses noted.  Psych:  Alert and cooperative. Normal mood and affect.  Musculoskeletal:  Normal gait. Head normocephalic, atraumatic. Symmetrical without gross deformities. 5/5 Lower extremity strength bilaterally.  Skin: Warm. Intact without significant lesions or rashes. No jaundice.  Neck: Supple, trachea midline  Lymph: No cervical lymphadenopathy  Psych:  Alert and oriented x3, Alert and cooperative. Normal mood and affect.  Labs: CMP  Component Value Date/Time   NA 138 05/06/2020 0537   K 4.3 05/06/2020 0537   CL 106 05/06/2020 0537   CO2 23 05/06/2020 0537   GLUCOSE 108 (H) 05/06/2020 0537   BUN 10 05/06/2020 0537   CREATININE 0.98 05/06/2020 0537   CALCIUM 8.4 (L) 05/06/2020 0537   PROT 6.2 (L) 05/06/2020 0309   ALBUMIN 3.7 05/06/2020 0309   AST 19 05/06/2020 0309   ALT 14 05/06/2020 0309   ALKPHOS 30 (L) 05/06/2020 0309   BILITOT 0.3 05/06/2020 0309   GFRNONAA >60 05/06/2020 0537   GFRAA >60 02/16/2018 1523   Lab Results  Component Value Date   WBC 6.3 02/16/2021   HGB 8.3 (L) 03/11/2021   HCT 27.1 (L) 02/16/2021   MCV 85.2 02/16/2021   PLT 376 02/16/2021    Imaging Studies:   Assessment and Plan:   Hailey Farrell is a 70 y.o. y/o female with a history of AVMs and iron deficiency anemia here for follow-up  I will check at this time to ensure that it has  improved from before  Continue follow-up with hematology and IV iron replacement as necessary  No evidence of active bleeding at this time  Patient also has a history of gastric intestinal metaplasia, and repeat EGD will be needed in 3 years for mapping biopsies  Follow-up in 2 to 3 months or earlier if needed  Dr Vonda Antigua

## 2021-04-01 LAB — HEMOGLOBIN: Hemoglobin: 8.5 g/dL — ABNORMAL LOW (ref 11.1–15.9)

## 2021-04-04 NOTE — Progress Notes (Signed)
Great! Thanks so much. I will reach out.   Faythe Casa, NP 04/04/2021 12:32 PM

## 2021-04-18 ENCOUNTER — Encounter: Payer: Self-pay | Admitting: Nurse Practitioner

## 2021-04-18 ENCOUNTER — Inpatient Hospital Stay: Payer: Medicare Other

## 2021-04-18 ENCOUNTER — Inpatient Hospital Stay: Payer: Medicare Other | Attending: Nurse Practitioner

## 2021-04-18 ENCOUNTER — Other Ambulatory Visit: Payer: Self-pay

## 2021-04-18 ENCOUNTER — Inpatient Hospital Stay (HOSPITAL_BASED_OUTPATIENT_CLINIC_OR_DEPARTMENT_OTHER): Payer: Medicare Other | Admitting: Nurse Practitioner

## 2021-04-18 VITALS — BP 112/68 | HR 98

## 2021-04-18 VITALS — BP 102/59 | HR 102 | Temp 97.6°F | Resp 16 | Wt 185.6 lb

## 2021-04-18 DIAGNOSIS — D509 Iron deficiency anemia, unspecified: Secondary | ICD-10-CM

## 2021-04-18 DIAGNOSIS — D649 Anemia, unspecified: Secondary | ICD-10-CM | POA: Diagnosis not present

## 2021-04-18 DIAGNOSIS — Z79899 Other long term (current) drug therapy: Secondary | ICD-10-CM | POA: Insufficient documentation

## 2021-04-18 DIAGNOSIS — D508 Other iron deficiency anemias: Secondary | ICD-10-CM

## 2021-04-18 LAB — CBC WITH DIFFERENTIAL/PLATELET
Abs Immature Granulocytes: 0.01 10*3/uL (ref 0.00–0.07)
Basophils Absolute: 0 10*3/uL (ref 0.0–0.1)
Basophils Relative: 1 %
Eosinophils Absolute: 0 10*3/uL (ref 0.0–0.5)
Eosinophils Relative: 1 %
HCT: 21.9 % — ABNORMAL LOW (ref 36.0–46.0)
Hemoglobin: 6.8 g/dL — ABNORMAL LOW (ref 12.0–15.0)
Immature Granulocytes: 0 %
Lymphocytes Relative: 30 %
Lymphs Abs: 1.4 10*3/uL (ref 0.7–4.0)
MCH: 24.7 pg — ABNORMAL LOW (ref 26.0–34.0)
MCHC: 31.1 g/dL (ref 30.0–36.0)
MCV: 79.6 fL — ABNORMAL LOW (ref 80.0–100.0)
Monocytes Absolute: 0.4 10*3/uL (ref 0.1–1.0)
Monocytes Relative: 9 %
Neutro Abs: 2.8 10*3/uL (ref 1.7–7.7)
Neutrophils Relative %: 59 %
Platelets: 394 10*3/uL (ref 150–400)
RBC: 2.75 MIL/uL — ABNORMAL LOW (ref 3.87–5.11)
RDW: 17.1 % — ABNORMAL HIGH (ref 11.5–15.5)
WBC: 4.7 10*3/uL (ref 4.0–10.5)
nRBC: 0.6 % — ABNORMAL HIGH (ref 0.0–0.2)

## 2021-04-18 LAB — SAMPLE TO BLOOD BANK

## 2021-04-18 LAB — IRON AND TIBC
Iron: 37 ug/dL (ref 28–170)
Saturation Ratios: 8 % — ABNORMAL LOW (ref 10.4–31.8)
TIBC: 438 ug/dL (ref 250–450)
UIBC: 401 ug/dL

## 2021-04-18 LAB — PREPARE RBC (CROSSMATCH)

## 2021-04-18 LAB — FERRITIN: Ferritin: 8 ng/mL — ABNORMAL LOW (ref 11–307)

## 2021-04-18 MED ORDER — IRON SUCROSE 20 MG/ML IV SOLN
200.0000 mg | Freq: Once | INTRAVENOUS | Status: AC
  Administered 2021-04-18: 200 mg via INTRAVENOUS
  Filled 2021-04-18: qty 10

## 2021-04-18 MED ORDER — SODIUM CHLORIDE 0.9 % IV SOLN: 200.0000 mg | Freq: Once | INTRAVENOUS | Status: DC

## 2021-04-18 MED ORDER — SODIUM CHLORIDE 0.9 % IV SOLN
INTRAVENOUS | Status: DC | PRN
  Filled 2021-04-18: qty 250

## 2021-04-18 NOTE — Progress Notes (Signed)
[place]  [phone] [phone]  ID: Hailey Farrell OB: [birth year]  MR#: [id number]  MGQ#:[id number]  Patient Care Team: Letta Median, MD as PCP - General (Family Medicine)  CHIEF COMPLAINT: Iron deficiency anemia  HPI- Patient was initially hospitalized from 05/06/20-05/08/2020 for acute onset dizziness and lightheadedness with presyncopal episode.  Lab work showed a hemoglobin of 3.4.  She received 3 units of packed red blood cells.  Iron studies were found to be very low.  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  They were treated with argon plasma coagulation.  She received 1 dose of iron on 05/07/2020.   INTERVAL HISTORY: Patient is 70 year old female who returns to colinic for repeat laboratory evaluation and follow up for history of iron deficiency anemia and AVMs. She underwent push enteroscopy in October with AVMs seen and treated. No melena or hematochezia. Some shortness of breath and fatigue.    REVIEW OF SYSTEMS:   Review of Systems  Constitutional:  Positive for malaise/fatigue. Negative for chills, fever and weight loss.  HENT:  Negative for hearing loss, nosebleeds, sore throat and tinnitus.   Eyes:  Negative for blurred vision and double vision.  Respiratory:  Negative for cough, hemoptysis, shortness of breath and wheezing.   Cardiovascular:  Negative for chest pain, palpitations and leg swelling.  Gastrointestinal:  Negative for abdominal pain, blood in stool, constipation, diarrhea, melena, nausea and vomiting.  Genitourinary:  Negative for dysuria and urgency.  Musculoskeletal:  Negative for back pain, falls, joint pain and myalgias.  Skin:  Negative for itching and rash.  Neurological:  Negative for dizziness, tingling, sensory change, loss of consciousness, weakness and headaches.  Endo/Heme/Allergies:  Negative for environmental  allergies. Does not bruise/bleed easily.  Psychiatric/Behavioral:  Negative for depression. The patient is not nervous/anxious and does not have insomnia.   As per HPI. Otherwise, a complete review of systems is negative.  PAST MEDICAL HISTORY: Past Medical History:  Diagnosis Date   Anemia    Arthritis    Breast cancer ([place]) 1990   Right   COPD (chronic obstructive pulmonary disease) ([place])    Dyspnea    DOE   Elevated lipids    Hypertension    Lower extremity edema    Migraines    Migraines    Personal history of radiation therapy 1990   Breast   Restless leg syndrome    Rotator cuff injury     PAST SURGICAL HISTORY: Past Surgical History:  Procedure Laterality Date   ABDOMINAL HYSTERECTOMY     BACK SURGERY  08/2017   CERVICAL FUSION   BREAST BIOPSY Right 1990   LUMPECTOMY.  took lymph nodes   BREAST LUMPECTOMY     COLONOSCOPY WITH PROPOFOL N/A 07/20/2017   Procedure: COLONOSCOPY WITH PROPOFOL;  Surgeon: Virgel Manifold, MD;  Location: [facility] ENDOSCOPY;  Service: Endoscopy;  Laterality: N/A;   ENTEROSCOPY N/A 08/08/2017   Procedure: ENTEROSCOPY;  Surgeon: Lin Landsman, MD;  Location: [facility] ENDOSCOPY;  Service: Gastroenterology;  Laterality: N/A;   ENTEROSCOPY N/A 05/07/2020   Procedure: ENTEROSCOPY with Adult colonoscope;  Surgeon: Virgel Manifold, MD;  Location: [facility] ENDOSCOPY;  Service: Endoscopy;  Laterality: N/A;   ENTEROSCOPY N/A 03/17/2021   Procedure: ENTEROSCOPY;  Surgeon: Virgel Manifold, MD;  Location: [facility] ENDOSCOPY;  Service: Endoscopy;  Laterality: N/A;  PUSH   ESOPHAGOGASTRODUODENOSCOPY Left 04/13/2017   Procedure: ESOPHAGOGASTRODUODENOSCOPY (EGD);  Surgeon: Bonna Gains,  Lennette Bihari, MD;  Location: [facility] ENDOSCOPY;  Service: Endoscopy;  Laterality: Left;   ESOPHAGOGASTRODUODENOSCOPY (EGD) WITH PROPOFOL N/A 07/20/2017   Procedure: ESOPHAGOGASTRODUODENOSCOPY (EGD) WITH PROPOFOL;  Surgeon: Virgel Manifold, MD;  Location: [facility] ENDOSCOPY;   Service: Endoscopy;  Laterality: N/A;   ESOPHAGOGASTRODUODENOSCOPY (EGD) WITH PROPOFOL N/A 12/12/2017   Procedure: ESOPHAGOGASTRODUODENOSCOPY (EGD) WITH PROPOFOL;  Surgeon: Virgel Manifold, MD;  Location: [facility] ENDOSCOPY;  Service: Endoscopy;  Laterality: N/A;   ESOPHAGOGASTRODUODENOSCOPY (EGD) WITH PROPOFOL N/A 12/23/2020   Procedure: ESOPHAGOGASTRODUODENOSCOPY (EGD) WITH PROPOFOL;  Surgeon: Virgel Manifold, MD;  Location: [facility] ENDOSCOPY;  Service: Endoscopy;  Laterality: N/A;   GIVENS CAPSULE STUDY N/A 03/10/2021   Procedure: GIVENS CAPSULE STUDY;  Surgeon: Virgel Manifold, MD;  Location: [facility] ENDOSCOPY;  Service: Endoscopy;  Laterality: N/A;   MEDIAL PARTIAL KNEE REPLACEMENT Left 1990   torn ligament. no knee replacement at that time   PARTIAL HYSTERECTOMY     SHOULDER ARTHROSCOPY WITH OPEN ROTATOR CUFF REPAIR Left 03/01/2016   Procedure: SHOULDER ARTHROSCOPY WITH OPEN ROTATOR CUFF REPAIR;  Surgeon: Thornton Park, MD;  Location: [facility] ORS;  Service: Orthopedics;  Laterality: Left;   TOTAL KNEE ARTHROPLASTY Left 02/14/2018   Procedure: TOTAL KNEE ARTHROPLASTY;  Surgeon: Thornton Park, MD;  Location: [facility] ORS;  Service: Orthopedics;  Laterality: Left;    FAMILY HISTORY: Family History  Problem Relation Age of Onset   CAD Mother    CAD Sister    Breast cancer Neg Hx    ADVANCED DIRECTIVES (Y/N):  N  HEALTH MAINTENANCE: Social History   Tobacco Use   Smoking status: Some Days    Packs/day: 0.25    Years: 50.00    Pack years: 12.50    Types: Cigarettes    Last attempt to quit: 03/13/2016    Years since quitting: 5.1   Smokeless tobacco: Never  Vaping Use   Vaping Use: Never used  Substance Use Topics   Alcohol use: No   Drug use: No    Colonoscopy:  PAP:  Bone density:  Lipid panel:  Allergies  Allergen Reactions   Latex Rash    Welts over body   Current Outpatient Medications  Medication Sig Dispense Refill   albuterol (VENTOLIN HFA) 108 (90 Base)  MCG/ACT inhaler Inhale 2 puffs into the lungs every 6 (six) hours as needed for wheezing.     amitriptyline (ELAVIL) 25 MG tablet Take 25 mg by mouth at bedtime.     diclofenac Sodium (VOLTAREN) 1 % GEL Apply topically daily as needed.     ferrous [id number] C-folic acid (TRINSICON / FOLTRIN) capsule Take 1 capsule by mouth 3 (three) times daily after meals. 90 capsule 2   furosemide (LASIX) 20 MG tablet Take 20 mg daily by mouth.      lisinopril (PRINIVIL,ZESTRIL) 20 MG tablet Take 20 mg by mouth daily.     potassium chloride (MICRO-K) 10 MEQ CR capsule Take 10 mEq by mouth daily.     rOPINIRole (REQUIP) 0.5 MG tablet Take 2 mg by mouth at bedtime as needed (restless leg syndrome).     simvastatin (ZOCOR) 20 MG tablet Take 20 mg by mouth daily.      SUMAtriptan (IMITREX) 6 MG/0.5ML SOLN injection Inject 6 mg into the skin every 2 (two) hours as needed for migraine or headache. May repeat in 2 hours if headache persists or recurs.     tiotropium (SPIRIVA) 18 MCG inhalation capsule Place 18 mcg into inhaler and inhale daily.  vitamin B-12 (CYANOCOBALAMIN) 1000 MCG tablet Take 1,000 mcg by mouth daily.     budesonide-formoterol (SYMBICORT) 160-4.5 MCG/ACT inhaler Inhale 2 puffs into the lungs daily as needed (wheezing or shortness of breath).  (Patient not taking: Reported on 04/18/2021)     No current facility-administered medications for this visit.   OBJECTIVE: Vitals:   04/18/21 1325  BP: (!) 102/59  Pulse: (!) 102  Resp: 16  Temp: 97.6 F (36.4 C)  SpO2: 99%      Body mass index is 31.86 kg/m.    ECOG FS:0 - Asymptomatic  Physical Exam Constitutional:      General: She is not in acute distress.    Appearance: She is well-developed. She is not ill-appearing.  HENT:     Mouth/Throat:     Pharynx: No oropharyngeal exudate.  Eyes:     General: No scleral icterus. Cardiovascular:     Rate and Rhythm: Normal rate and regular rhythm.  Pulmonary:     Effort: Pulmonary  effort is normal.     Breath sounds: Normal breath sounds.  Abdominal:     General: There is no distension.     Palpations: Abdomen is soft.     Tenderness: There is no abdominal tenderness. There is no rebound.  Musculoskeletal:        General: No tenderness or deformity.     Right lower leg: No edema.     Left lower leg: No edema.  Skin:    General: Skin is warm and dry.     Coloration: Skin is pale.  Neurological:     General: No focal deficit present.     Mental Status: She is alert and oriented to person, place, and time.     Motor: No weakness.  Psychiatric:        Mood and Affect: Mood normal.        Behavior: Behavior normal.     LAB RESULTS:  Lab Results  Component Value Date   NA 138 05/06/2020   K 4.3 05/06/2020   CL 106 05/06/2020   CO2 23 05/06/2020   GLUCOSE 108 (H) 05/06/2020   BUN 10 05/06/2020   CREATININE 0.98 05/06/2020   CALCIUM 8.4 (L) 05/06/2020   PROT 6.2 (L) 05/06/2020   ALBUMIN 3.7 05/06/2020   AST 19 05/06/2020   ALT 14 05/06/2020   ALKPHOS 30 (L) 05/06/2020   BILITOT 0.3 05/06/2020   GFRNONAA >60 05/06/2020   GFRAA >60 02/16/2018    Lab Results  Component Value Date   WBC 4.7 04/18/2021   NEUTROABS 2.8 04/18/2021   HGB 6.8 (L) 04/18/2021   HCT 21.9 (L) 04/18/2021   MCV 79.6 (L) 04/18/2021   PLT 394 04/18/2021   Lab Results  Component Value Date   IRON 37 04/18/2021   TIBC 438 04/18/2021   IRONPCTSAT 8 (L) 04/18/2021   Lab Results  Component Value Date   FERRITIN 8 (L) 04/18/2021     STUDIES: No results found.  ASSESSMENT: Iron deficiency anemia.  PLAN:    1. Iron deficiency anemia: etiology likely related to AVMs. See below. Hemoglobin today 6.8. Worse. Ferritin 8, iron saturation 8%, TIBC 438. Findings consistent with iron deficiency anemia.  2. Symptomatic anemia- hemoglobin < 7. Plan for 1 unit of pRBCs tomorrow.  3. AVMs- managed by Dr. Bonna Gains. S/p enteroscopy 03/17/21. Given recent drop in hemoglobin I  recommended that she reach out to GI to follow up.   Disposition: Blood 04/19/21 Venofer x 5; she  will receive first dose today RTC in 12 weeks for labs, then day or 2 later, see Woodfin Ganja for follow up and poss venofer.... la  I spent 30 minutes dedicated to the care of this patient (face-to-face and non-face-to-face) on the date of the encounter to include what is described in the assessment and plan.  Patient expressed understanding and was in agreement with this plan. She also understands that She can call clinic at any time with any questions, concerns, or complaints.   Verlon Au, NP   04/18/2021   CC: Dr. Bonna Gains & Dr. Rebeca Alert

## 2021-04-19 ENCOUNTER — Inpatient Hospital Stay: Payer: Medicare Other

## 2021-04-19 DIAGNOSIS — D649 Anemia, unspecified: Secondary | ICD-10-CM

## 2021-04-19 DIAGNOSIS — D509 Iron deficiency anemia, unspecified: Secondary | ICD-10-CM | POA: Diagnosis not present

## 2021-04-19 MED ORDER — SODIUM CHLORIDE 0.9% FLUSH
10.0000 mL | INTRAVENOUS | Status: DC | PRN
  Filled 2021-04-19: qty 10

## 2021-04-19 MED ORDER — SODIUM CHLORIDE 0.9% IV SOLUTION
250.0000 mL | Freq: Once | INTRAVENOUS | Status: AC
  Administered 2021-04-19: 250 mL via INTRAVENOUS
  Filled 2021-04-19: qty 250

## 2021-04-19 NOTE — Patient Instructions (Signed)
Blood Transfusion, Adult, Care After This sheet gives you information about how to care for yourself after your procedure. Your doctor may also give you more specific instructions. If you have problems or questions, contact your doctor. What can I expect after the procedure? After the procedure, it is common to have: Bruising and soreness at the IV site. A headache. Follow these instructions at home: Insertion site care   Follow instructions from your doctor about how to take care of your insertion site. This is where an IV tube was put into your vein. Make sure you: Wash your hands with soap and water before and after you change your bandage (dressing). If you cannot use soap and water, use hand sanitizer. Change your bandage as told by your doctor. Check your insertion site every day for signs of infection. Check for: Redness, swelling, or pain. Bleeding from the site. Warmth. Pus or a bad smell. General instructions Take over-the-counter and prescription medicines only as told by your doctor. Rest as told by your doctor. Go back to your normal activities as told by your doctor. Keep all follow-up visits as told by your doctor. This is important. Contact a doctor if: You have itching or red, swollen areas of skin (hives). You feel worried or nervous (anxious). You feel weak after doing your normal activities. You have redness, swelling, warmth, or pain around the insertion site. You have blood coming from the insertion site, and the blood does not stop with pressure. You have pus or a bad smell coming from the insertion site. Get help right away if: You have signs of a serious reaction. This may be coming from an allergy or the body's defense system (immune system). Signs include: Trouble breathing or shortness of breath. Swelling of the face or feeling warm (flushed). Fever or chills. Head, chest, or back pain. Dark pee (urine) or blood in the pee. Widespread rash. Fast  heartbeat. Feeling dizzy or light-headed. You may receive your blood transfusion in an outpatient setting. If so, you will be told whom to contact to report any reactions. These symptoms may be an emergency. Do not wait to see if the symptoms will go away. Get medical help right away. Call your local emergency services (911 in the [place] Do not drive yourself to the hospital. Summary Bruising and soreness at the IV site are common. Check your insertion site every day for signs of infection. Rest as told by your doctor. Go back to your normal activities as told by your doctor. Get help right away if you have signs of a serious reaction. This information is not intended to replace advice given to you by your health care provider. Make sure you discuss any questions you have with your health care provider. Document Revised: 09/16/2020 Document Reviewed: 11/14/2018 Elsevier Patient Education  2022 Elsevier Inc.  

## 2021-04-20 LAB — BPAM RBC
Blood Product Expiration Date: 202212142359
ISSUE DATE / TIME: 202211151325
Unit Type and Rh: 5100

## 2021-04-20 LAB — TYPE AND SCREEN
ABO/RH(D): O POS
Antibody Screen: NEGATIVE
Unit division: 0

## 2021-04-25 ENCOUNTER — Inpatient Hospital Stay: Payer: Medicare Other

## 2021-04-25 ENCOUNTER — Other Ambulatory Visit: Payer: Self-pay

## 2021-04-25 ENCOUNTER — Encounter: Payer: Self-pay | Admitting: Oncology

## 2021-04-25 VITALS — BP 112/69 | HR 99 | Temp 98.7°F | Resp 18

## 2021-04-25 DIAGNOSIS — D509 Iron deficiency anemia, unspecified: Secondary | ICD-10-CM

## 2021-04-25 MED ORDER — SODIUM CHLORIDE 0.9 % IV SOLN: 200.0000 mg | Freq: Once | INTRAVENOUS | Status: DC

## 2021-04-25 MED ORDER — IRON SUCROSE 20 MG/ML IV SOLN
200.0000 mg | Freq: Once | INTRAVENOUS | Status: AC
  Administered 2021-04-25: 200 mg via INTRAVENOUS
  Filled 2021-04-25: qty 10

## 2021-04-25 MED ORDER — SODIUM CHLORIDE 0.9 % IV SOLN
Freq: Once | INTRAVENOUS | Status: AC
  Filled 2021-04-25: qty 250

## 2021-05-03 ENCOUNTER — Inpatient Hospital Stay: Payer: Medicare Other

## 2021-05-03 ENCOUNTER — Other Ambulatory Visit: Payer: Self-pay

## 2021-05-03 VITALS — BP 150/73 | HR 92 | Temp 96.5°F | Resp 20

## 2021-05-03 DIAGNOSIS — D509 Iron deficiency anemia, unspecified: Secondary | ICD-10-CM | POA: Diagnosis not present

## 2021-05-03 MED ORDER — IRON SUCROSE 20 MG/ML IV SOLN
200.0000 mg | Freq: Once | INTRAVENOUS | Status: AC
  Administered 2021-05-03: 200 mg via INTRAVENOUS

## 2021-05-03 MED ORDER — SODIUM CHLORIDE 0.9 % IV SOLN
INTRAVENOUS | Status: DC
  Filled 2021-05-03: qty 250

## 2021-05-03 MED ORDER — SODIUM CHLORIDE 0.9 % IV SOLN: 200.0000 mg | Freq: Once | INTRAVENOUS | Status: DC

## 2021-05-03 NOTE — Patient Instructions (Signed)

## 2021-05-09 ENCOUNTER — Inpatient Hospital Stay: Payer: Medicare Other | Attending: Oncology

## 2021-05-09 ENCOUNTER — Other Ambulatory Visit: Payer: Self-pay

## 2021-05-09 VITALS — BP 136/77 | HR 88 | Temp 96.9°F | Resp 18

## 2021-05-09 DIAGNOSIS — Z79899 Other long term (current) drug therapy: Secondary | ICD-10-CM | POA: Insufficient documentation

## 2021-05-09 DIAGNOSIS — D509 Iron deficiency anemia, unspecified: Secondary | ICD-10-CM | POA: Diagnosis not present

## 2021-05-09 MED ORDER — SODIUM CHLORIDE 0.9 % IV SOLN
INTRAVENOUS | Status: DC
  Filled 2021-05-09: qty 250

## 2021-05-09 MED ORDER — SODIUM CHLORIDE 0.9 % IV SOLN: 200.0000 mg | Freq: Once | INTRAVENOUS | Status: DC

## 2021-05-09 MED ORDER — IRON SUCROSE 20 MG/ML IV SOLN
200.0000 mg | Freq: Once | INTRAVENOUS | Status: AC
  Administered 2021-05-09: 200 mg via INTRAVENOUS
  Filled 2021-05-09: qty 10

## 2021-05-09 NOTE — Patient Instructions (Signed)

## 2021-05-16 ENCOUNTER — Inpatient Hospital Stay: Payer: Medicare Other

## 2021-05-16 ENCOUNTER — Other Ambulatory Visit: Payer: Self-pay

## 2021-05-16 VITALS — BP 117/68 | HR 90 | Temp 97.0°F | Resp 18

## 2021-05-16 DIAGNOSIS — D509 Iron deficiency anemia, unspecified: Secondary | ICD-10-CM

## 2021-05-16 MED ORDER — SODIUM CHLORIDE 0.9 % IV SOLN: 200.0000 mg | Freq: Once | INTRAVENOUS | Status: DC

## 2021-05-16 MED ORDER — IRON SUCROSE 20 MG/ML IV SOLN
200.0000 mg | Freq: Once | INTRAVENOUS | Status: AC
  Administered 2021-05-16: 200 mg via INTRAVENOUS
  Filled 2021-05-16: qty 10

## 2021-05-16 MED ORDER — SODIUM CHLORIDE 0.9 % IV SOLN
INTRAVENOUS | Status: DC
  Filled 2021-05-16: qty 250

## 2021-05-23 ENCOUNTER — Other Ambulatory Visit: Payer: Self-pay

## 2021-05-23 ENCOUNTER — Inpatient Hospital Stay: Payer: Medicare Other

## 2021-05-23 VITALS — BP 120/73 | HR 102 | Temp 96.8°F | Resp 18

## 2021-05-23 DIAGNOSIS — D509 Iron deficiency anemia, unspecified: Secondary | ICD-10-CM

## 2021-05-23 MED ORDER — SODIUM CHLORIDE 0.9 % IV SOLN: 200.0000 mg | Freq: Once | INTRAVENOUS | Status: DC

## 2021-05-23 MED ORDER — SODIUM CHLORIDE 0.9 % IV SOLN
INTRAVENOUS | Status: DC
  Filled 2021-05-23: qty 250

## 2021-05-23 MED ORDER — IRON SUCROSE 20 MG/ML IV SOLN
200.0000 mg | Freq: Once | INTRAVENOUS | Status: AC
  Administered 2021-05-23: 15:00:00 200 mg via INTRAVENOUS
  Filled 2021-05-23: qty 10

## 2021-05-23 NOTE — Addendum Note (Signed)
Addended by: Charlyn Minerva on: 05/23/2021 02:31 PM   Modules accepted: Orders

## 2021-05-23 NOTE — Addendum Note (Signed)
Addended by: Quita Skye on: 05/23/2021 02:54 PM   Modules accepted: Orders

## 2021-05-23 NOTE — Patient Instructions (Signed)
[facility]  Discharge Instructions: ?Thank you for choosing [facility] to provide your oncology and hematology care.  ?If you have a lab appointment with the Cancer Center, please go directly to the Cancer Center and check in at the registration area. ? ?Wear comfortable clothing and clothing appropriate for easy access to any Portacath or PICC line.  ? ?We strive to give you quality time with your provider. You may need to reschedule your appointment if you arrive late (15 or more minutes).  Arriving late affects you and other patients whose appointments are after yours.  Also, if you miss three or more appointments without notifying the office, you may be dismissed from the clinic at the provider?s discretion.    ?  ?For prescription refill requests, have your pharmacy contact our office and allow 72 hours for refills to be completed.   ? ?Today you received the following chemotherapy and/or immunotherapy agents VENOFER    ?  ?To help prevent nausea and vomiting after your treatment, we encourage you to take your nausea medication as directed. ? ?BELOW ARE SYMPTOMS THAT SHOULD BE REPORTED IMMEDIATELY: ?*FEVER GREATER THAN 100.4 F (38 ?C) OR HIGHER ?*CHILLS OR SWEATING ?*NAUSEA AND VOMITING THAT IS NOT CONTROLLED WITH YOUR NAUSEA MEDICATION ?*UNUSUAL SHORTNESS OF BREATH ?*UNUSUAL BRUISING OR BLEEDING ?*URINARY PROBLEMS (pain or burning when urinating, or frequent urination) ?*BOWEL PROBLEMS (unusual diarrhea, constipation, pain near the anus) ?TENDERNESS IN MOUTH AND THROAT WITH OR WITHOUT PRESENCE OF ULCERS (sore throat, sores in mouth, or a toothache) ?UNUSUAL RASH, SWELLING OR PAIN  ?UNUSUAL VAGINAL DISCHARGE OR ITCHING  ? ?Items with * indicate a potential emergency and should be followed up as soon as possible or go to the Emergency Department if any problems should occur. ? ?Please show the CHEMOTHERAPY ALERT CARD or IMMUNOTHERAPY ALERT CARD at check-in to the  Emergency Department and triage nurse. ? ?Should you have questions after your visit or need to cancel or reschedule your appointment, please contact [facility]  [phone] and follow the prompts.  Office hours are 8:00 a.m. to 4:30 p.m. Monday - Friday. Please note that voicemails left after 4:00 p.m. may not be returned until the following business day.  We are closed weekends and major holidays. You have access to a nurse at all times for urgent questions. Please call the main number to the clinic [phone] and follow the prompts. ? ?For any non-urgent questions, you may also contact your provider using MyChart. We now offer e-Visits for anyone 18 and older to request care online for non-urgent symptoms. For details visit [email or web address]. ?  ?Also download the MyChart app! Go to the app store, search "MyChart", open the app, select [facility], and log in with your MyChart username and password. ? ?Due to Covid, a mask is required upon entering the hospital/clinic. If you do not have a mask, one will be given to you upon arrival. For doctor visits, patients may have 1 support person aged 18 or older with them. For treatment visits, patients cannot have anyone with them due to current Covid guidelines and our immunocompromised population.  ? ?Iron Sucrose Injection ?What is this medication? ?IRON SUCROSE (EYE ern SOO krose) treats low levels of iron (iron deficiency anemia) in people with kidney disease. Iron is a mineral that plays an important role in making red blood cells, which carry oxygen from your lungs to the rest of your body. ?This medicine may   be used for other purposes; ask your health care provider or pharmacist if you have questions. ?COMMON BRAND NAME(S): Venofer ?What should I tell my care team before I take this medication? ?They need to know if you have any of these conditions: ?Anemia not caused by low iron levels ?Heart disease ?High levels of  iron in the blood ?Kidney disease ?Liver disease ?An unusual or allergic reaction to iron, other medications, foods, dyes, or preservatives ?Pregnant or trying to get pregnant ?Breast-feeding ?How should I use this medication? ?This medication is for infusion into a vein. It is given in a hospital or clinic setting. ?Talk to your care team about the use of this medication in children. While this medication may be prescribed for children as young as 2 years for selected conditions, precautions do apply. ?Overdosage: If you think you have taken too much of this medicine contact a poison control center or emergency room at once. ?NOTE: This medicine is only for you. Do not share this medicine with others. ?What if I miss a dose? ?It is important not to miss your dose. Call your care team if you are unable to keep an appointment. ?What may interact with this medication? ?Do not take this medication with any of the following: ?Deferoxamine ?Dimercaprol ?Other iron products ?This medication may also interact with the following: ?Chloramphenicol ?Deferasirox ?This list may not describe all possible interactions. Give your health care provider a list of all the medicines, herbs, non-prescription drugs, or dietary supplements you use. Also tell them if you smoke, drink alcohol, or use illegal drugs. Some items may interact with your medicine. ?What should I watch for while using this medication? ?Visit your care team regularly. Tell your care team if your symptoms do not start to get better or if they get worse. You may need blood work done while you are taking this medication. ?You may need to follow a special diet. Talk to your care team. Foods that contain iron include: whole grains/cereals, dried fruits, beans, or peas, leafy green vegetables, and organ meats (liver, kidney). ?What side effects may I notice from receiving this medication? ?Side effects that you should report to your care team as soon as  possible: ?Allergic reactions--skin rash, itching, hives, swelling of the face, lips, tongue, or throat ?Low blood pressure--dizziness, feeling faint or lightheaded, blurry vision ?Shortness of breath ?Side effects that usually do not require medical attention (report to your care team if they continue or are bothersome): ?Flushing ?Headache ?Joint pain ?Muscle pain ?Nausea ?Pain, redness, or irritation at injection site ?This list may not describe all possible side effects. Call your doctor for medical advice about side effects. You may report side effects to FDA at 1-800-FDA-1088. ?Where should I keep my medication? ?This medication is given in a hospital or clinic and will not be stored at home. ?NOTE: This sheet is a summary. It may not cover all possible information. If you have questions about this medicine, talk to your doctor, pharmacist, or health care provider. ?? 2022 Elsevier/Gold Standard (2020-10-15 00:00:00) ? ?

## 2021-05-24 ENCOUNTER — Other Ambulatory Visit: Payer: Medicare HMO

## 2021-05-24 ENCOUNTER — Ambulatory Visit: Payer: Self-pay | Admitting: Oncology

## 2021-05-24 ENCOUNTER — Ambulatory Visit: Payer: Medicare HMO

## 2021-06-13 ENCOUNTER — Telehealth: Payer: Self-pay

## 2021-06-13 ENCOUNTER — Encounter: Payer: Self-pay | Admitting: Oncology

## 2021-06-13 NOTE — Telephone Encounter (Signed)
I called to see how the patient was doing. Patient states she is doing great. She has not had any dark stools. She does has an appointment with GI sometime in February. I told patient we would like to check her blood work. Appointment made for Wednesday 1/11 at 11:15 (LAB ONLY)

## 2021-06-14 ENCOUNTER — Telehealth: Payer: Self-pay

## 2021-06-14 NOTE — Telephone Encounter (Signed)
Left message on voicemail requesting pt return my call to schedule an appt within the next week

## 2021-06-14 NOTE — Telephone Encounter (Signed)
Virgel Manifold, MD  Rozella Servello, Tera Partridge, CMA As per Epic chat conversation, pt will need clinic appt in 1-2 weeks. But if pt has active bleeding, will need to go to ER or urgent clinic appt.. As per chat with PCP on Epic, they are repeating Hgb this week as well

## 2021-06-15 ENCOUNTER — Other Ambulatory Visit: Payer: Self-pay

## 2021-06-15 ENCOUNTER — Inpatient Hospital Stay: Payer: Medicare Other | Attending: Oncology

## 2021-06-15 DIAGNOSIS — D509 Iron deficiency anemia, unspecified: Secondary | ICD-10-CM | POA: Diagnosis present

## 2021-06-15 LAB — CBC WITH DIFFERENTIAL/PLATELET
Abs Immature Granulocytes: 0.01 10*3/uL (ref 0.00–0.07)
Basophils Absolute: 0 10*3/uL (ref 0.0–0.1)
Basophils Relative: 1 %
Eosinophils Absolute: 0 10*3/uL (ref 0.0–0.5)
Eosinophils Relative: 1 %
HCT: 26.8 % — ABNORMAL LOW (ref 36.0–46.0)
Hemoglobin: 8.2 g/dL — ABNORMAL LOW (ref 12.0–15.0)
Immature Granulocytes: 0 %
Lymphocytes Relative: 27 %
Lymphs Abs: 1.3 10*3/uL (ref 0.7–4.0)
MCH: 26 pg (ref 26.0–34.0)
MCHC: 30.6 g/dL (ref 30.0–36.0)
MCV: 85.1 fL (ref 80.0–100.0)
Monocytes Absolute: 0.5 10*3/uL (ref 0.1–1.0)
Monocytes Relative: 10 %
Neutro Abs: 2.9 10*3/uL (ref 1.7–7.7)
Neutrophils Relative %: 61 %
Platelets: 374 10*3/uL (ref 150–400)
RBC: 3.15 MIL/uL — ABNORMAL LOW (ref 3.87–5.11)
RDW: 18.6 % — ABNORMAL HIGH (ref 11.5–15.5)
WBC: 4.8 10*3/uL (ref 4.0–10.5)
nRBC: 0 % (ref 0.0–0.2)

## 2021-06-24 NOTE — Telephone Encounter (Signed)
Letter mailed

## 2021-07-05 ENCOUNTER — Other Ambulatory Visit: Payer: Self-pay | Admitting: *Deleted

## 2021-07-05 DIAGNOSIS — D509 Iron deficiency anemia, unspecified: Secondary | ICD-10-CM

## 2021-07-11 NOTE — Progress Notes (Deleted)
[place]  [phone] [phone]) [phone]  ID: EDISON WOLLSCHLAGER OB: January 26, 1951  MR#: [id number]  GHW#:[id number]  Patient Care Team: Letta Median, MD as PCP - General (Family Medicine)  CHIEF COMPLAINT: Iron deficiency anemia  INTERVAL HISTORY: Patient returns to clinic today for further evaluation and hospital follow-up.  She feels significantly improved and is at her baseline since receiving multiple units of packed red blood cells during her admission.  She currently feels well and is asymptomatic.  She does not complain of any further weakness or fatigue.  She has no neurologic complaints.  She denies any recent fevers or illnesses.  She has a good appetite and denies weight loss.    She denies any chest pain, shortness of breath, cough, or hemoptysis.  She denies any nausea, vomiting, constipation, or diarrhea.  She has no melena or hematochezia.  She has no urinary complaints.    Patient offers no specific complaints today.  REVIEW OF SYSTEMS:   Review of Systems  Constitutional: Negative.  Negative for fever, malaise/fatigue and weight loss.  Respiratory: Negative.  Negative for cough and shortness of breath.   Cardiovascular: Negative.  Negative for chest pain and leg swelling.  Gastrointestinal: Negative.  Negative for abdominal pain, blood in stool and melena.  Genitourinary: Negative.  Negative for hematuria.  Musculoskeletal: Negative.  Negative for back pain.  Skin: Negative.  Negative for rash.  Neurological: Negative.  Negative for dizziness, focal weakness, weakness and headaches.  Psychiatric/Behavioral: Negative.  The patient is not nervous/anxious.    As per HPI. Otherwise, a complete review of systems is negative.  PAST MEDICAL HISTORY: Past Medical History:  Diagnosis Date   Anemia    Arthritis    Breast cancer ([place]) 1990   Right   COPD (chronic obstructive pulmonary disease) ([place])    Dyspnea    DOE   Elevated lipids     Hypertension    Lower extremity edema    Migraines    Migraines    Personal history of radiation therapy 1990   Breast   Restless leg syndrome    Rotator cuff injury     PAST SURGICAL HISTORY: Past Surgical History:  Procedure Laterality Date   ABDOMINAL HYSTERECTOMY     BACK SURGERY  08/2017   CERVICAL FUSION   BREAST BIOPSY Right 1990   LUMPECTOMY.  took lymph nodes   BREAST LUMPECTOMY     COLONOSCOPY WITH PROPOFOL N/A 07/20/2017   Procedure: COLONOSCOPY WITH PROPOFOL;  Surgeon: Virgel Manifold, MD;  Location: [facility] ENDOSCOPY;  Service: Endoscopy;  Laterality: N/A;   ENTEROSCOPY N/A 08/08/2017   Procedure: ENTEROSCOPY;  Surgeon: Lin Landsman, MD;  Location: [facility] ENDOSCOPY;  Service: Gastroenterology;  Laterality: N/A;   ENTEROSCOPY N/A 05/07/2020   Procedure: ENTEROSCOPY with Adult colonoscope;  Surgeon: Virgel Manifold, MD;  Location: [facility] ENDOSCOPY;  Service: Endoscopy;  Laterality: N/A;   ENTEROSCOPY N/A 03/17/2021   Procedure: ENTEROSCOPY;  Surgeon: Virgel Manifold, MD;  Location: [facility] ENDOSCOPY;  Service: Endoscopy;  Laterality: N/A;  PUSH   ESOPHAGOGASTRODUODENOSCOPY Left 04/13/2017   Procedure: ESOPHAGOGASTRODUODENOSCOPY (EGD);  Surgeon: Virgel Manifold, MD;  Location: [facility] ENDOSCOPY;  Service: Endoscopy;  Laterality: Left;   ESOPHAGOGASTRODUODENOSCOPY (EGD) WITH PROPOFOL N/A 07/20/2017   Procedure: ESOPHAGOGASTRODUODENOSCOPY (EGD) WITH PROPOFOL;  Surgeon: Virgel Manifold, MD;  Location: [facility] ENDOSCOPY;  Service: Endoscopy;  Laterality: N/A;   ESOPHAGOGASTRODUODENOSCOPY (EGD) WITH PROPOFOL N/A 12/12/2017   Procedure: ESOPHAGOGASTRODUODENOSCOPY (EGD) WITH PROPOFOL;  Surgeon: Bonna Gains,  Lennette Bihari, MD;  Location: [facility] ENDOSCOPY;  Service: Endoscopy;  Laterality: N/A;   ESOPHAGOGASTRODUODENOSCOPY (EGD) WITH PROPOFOL N/A 12/23/2020   Procedure: ESOPHAGOGASTRODUODENOSCOPY (EGD) WITH PROPOFOL;  Surgeon: Virgel Manifold, MD;  Location: [facility]  ENDOSCOPY;  Service: Endoscopy;  Laterality: N/A;   GIVENS CAPSULE STUDY N/A 03/10/2021   Procedure: GIVENS CAPSULE STUDY;  Surgeon: Virgel Manifold, MD;  Location: [facility] ENDOSCOPY;  Service: Endoscopy;  Laterality: N/A;   MEDIAL PARTIAL KNEE REPLACEMENT Left 1990   torn ligament. no knee replacement at that time   PARTIAL HYSTERECTOMY     SHOULDER ARTHROSCOPY WITH OPEN ROTATOR CUFF REPAIR Left 03/01/2016   Procedure: SHOULDER ARTHROSCOPY WITH OPEN ROTATOR CUFF REPAIR;  Surgeon: Thornton Park, MD;  Location: [facility] ORS;  Service: Orthopedics;  Laterality: Left;   TOTAL KNEE ARTHROPLASTY Left 02/14/2018   Procedure: TOTAL KNEE ARTHROPLASTY;  Surgeon: Thornton Park, MD;  Location: [facility] ORS;  Service: Orthopedics;  Laterality: Left;    FAMILY HISTORY: Family History  Problem Relation Age of Onset   CAD Mother    CAD Sister    Breast cancer Neg Hx     ADVANCED DIRECTIVES (Y/N):  N  HEALTH MAINTENANCE: Social History   Tobacco Use   Smoking status: Some Days    Packs/day: 0.25    Years: 50.00    Pack years: 12.50    Types: Cigarettes    Last attempt to quit: 03/13/2016    Years since quitting: 5.3   Smokeless tobacco: Never  Vaping Use   Vaping Use: Never used  Substance Use Topics   Alcohol use: No   Drug use: No     Colonoscopy:  PAP:  Bone density:  Lipid panel:  Allergies  Allergen Reactions   Latex Rash    Welts over body    Current Outpatient Medications  Medication Sig Dispense Refill   albuterol (VENTOLIN HFA) 108 (90 Base) MCG/ACT inhaler Inhale 2 puffs into the lungs every 6 (six) hours as needed for wheezing.     amitriptyline (ELAVIL) 25 MG tablet Take 25 mg by mouth at bedtime.     budesonide-formoterol (SYMBICORT) 160-4.5 MCG/ACT inhaler Inhale 2 puffs into the lungs daily as needed (wheezing or shortness of breath).  (Patient not taking: Reported on 04/18/2021)     diclofenac Sodium (VOLTAREN) 1 % GEL Apply topically daily as needed.      ferrous [id number] C-folic acid (TRINSICON / FOLTRIN) capsule Take 1 capsule by mouth 3 (three) times daily after meals. 90 capsule 2   furosemide (LASIX) 20 MG tablet Take 20 mg daily by mouth.      lisinopril (PRINIVIL,ZESTRIL) 20 MG tablet Take 20 mg by mouth daily.     potassium chloride (MICRO-K) 10 MEQ CR capsule Take 10 mEq by mouth daily.     rOPINIRole (REQUIP) 0.5 MG tablet Take 2 mg by mouth at bedtime as needed (restless leg syndrome).     simvastatin (ZOCOR) 20 MG tablet Take 20 mg by mouth daily.      SUMAtriptan (IMITREX) 6 MG/0.5ML SOLN injection Inject 6 mg into the skin every 2 (two) hours as needed for migraine or headache. May repeat in 2 hours if headache persists or recurs.     tiotropium (SPIRIVA) 18 MCG inhalation capsule Place 18 mcg into inhaler and inhale daily.     vitamin B-12 (CYANOCOBALAMIN) 1000 MCG tablet Take 1,000 mcg by mouth daily.     No current facility-administered medications for this visit.   Facility-Administered Medications Ordered in  Other Visits  Medication Dose Route Frequency Provider Last Rate Last Admin   0.9 %  sodium chloride infusion   Intravenous Continuous Sindy Guadeloupe, MD   Stopped at 05/23/21 1452    OBJECTIVE: There were no vitals filed for this visit.    There is no height or weight on file to calculate BMI.    ECOG FS:0 - Asymptomatic  General: Well-developed, well-nourished, no acute distress. Eyes: Pink conjunctiva, anicteric sclera. HEENT: Normocephalic, moist mucous membranes. Lungs: No audible wheezing or coughing. Heart: Regular rate and rhythm. Abdomen: Soft, nontender, no obvious distention. Musculoskeletal: No edema, cyanosis, or clubbing. Neuro: Alert, answering all questions appropriately. Cranial nerves grossly intact. Skin: No rashes or petechiae noted. Psych: Normal affect. Lymphatics: No cervical, calvicular, axillary or inguinal LAD.   LAB RESULTS:  Lab Results  Component Value Date   NA 138  05/06/2020   K 4.3 05/06/2020   CL 106 05/06/2020   CO2 23 05/06/2020   GLUCOSE 108 (H) 05/06/2020   BUN 10 05/06/2020   CREATININE 0.98 05/06/2020   CALCIUM 8.4 (L) 05/06/2020   PROT 6.2 (L) 05/06/2020   ALBUMIN 3.7 05/06/2020   AST 19 05/06/2020   ALT 14 05/06/2020   ALKPHOS 30 (L) 05/06/2020   BILITOT 0.3 05/06/2020   GFRNONAA >60 05/06/2020   GFRAA >60 02/16/2018    Lab Results  Component Value Date   WBC 4.8 06/15/2021   NEUTROABS 2.9 06/15/2021   HGB 8.2 (L) 06/15/2021   HCT 26.8 (L) 06/15/2021   MCV 85.1 06/15/2021   PLT 374 06/15/2021   Lab Results  Component Value Date   IRON 37 04/18/2021   TIBC 438 04/18/2021   IRONPCTSAT 8 (L) 04/18/2021   Lab Results  Component Value Date   FERRITIN 8 (L) 04/18/2021     STUDIES: No results found.   ASSESSMENT: Iron deficiency anemia.  PLAN:    1. Iron deficiency anemia: Nearly resolved.  Iron stores are pending at time of dictation.  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  These were treated with argon plasma coagulation.  No intervention is needed at this time.  Return to clinic in 3 months with repeat laboratory, further evaluation, and consideration of treatment if needed.    I spent a total of 20 minutes reviewing chart data, face-to-face evaluation with the patient, counseling and coordination of care as detailed above.  Patient expressed understanding and was in agreement with this plan. She also understands that She can call clinic at any time with any questions, concerns, or complaints.    Lloyd Huger, MD   07/11/2021 9:01 AM

## 2021-07-12 ENCOUNTER — Inpatient Hospital Stay: Payer: Medicare Other

## 2021-07-12 ENCOUNTER — Emergency Department: Payer: Medicare Other

## 2021-07-12 ENCOUNTER — Observation Stay
Admission: EM | Admit: 2021-07-12 | Discharge: 2021-07-14 | Disposition: A | Discharge status: Home / Self Care | Payer: Medicare Other | Attending: Internal Medicine | Admitting: Internal Medicine

## 2021-07-12 ENCOUNTER — Other Ambulatory Visit: Payer: Self-pay

## 2021-07-12 ENCOUNTER — Inpatient Hospital Stay: Payer: Medicare Other | Admitting: Oncology

## 2021-07-12 DIAGNOSIS — Z91040 Latex allergy status: Secondary | ICD-10-CM | POA: Insufficient documentation

## 2021-07-12 DIAGNOSIS — Z96652 Presence of left artificial knee joint: Secondary | ICD-10-CM | POA: Diagnosis not present

## 2021-07-12 DIAGNOSIS — R531 Weakness: Secondary | ICD-10-CM | POA: Diagnosis present

## 2021-07-12 DIAGNOSIS — K922 Gastrointestinal hemorrhage, unspecified: Secondary | ICD-10-CM | POA: Diagnosis present

## 2021-07-12 DIAGNOSIS — D649 Anemia, unspecified: Secondary | ICD-10-CM | POA: Diagnosis not present

## 2021-07-12 DIAGNOSIS — D500 Iron deficiency anemia secondary to blood loss (chronic): Secondary | ICD-10-CM

## 2021-07-12 DIAGNOSIS — Z853 Personal history of malignant neoplasm of breast: Secondary | ICD-10-CM | POA: Insufficient documentation

## 2021-07-12 DIAGNOSIS — I10 Essential (primary) hypertension: Secondary | ICD-10-CM | POA: Diagnosis present

## 2021-07-12 DIAGNOSIS — Z7951 Long term (current) use of inhaled steroids: Secondary | ICD-10-CM | POA: Insufficient documentation

## 2021-07-12 DIAGNOSIS — R0602 Shortness of breath: Secondary | ICD-10-CM

## 2021-07-12 DIAGNOSIS — F17210 Nicotine dependence, cigarettes, uncomplicated: Secondary | ICD-10-CM | POA: Diagnosis not present

## 2021-07-12 DIAGNOSIS — K5521 Angiodysplasia of colon with hemorrhage: Secondary | ICD-10-CM | POA: Diagnosis present

## 2021-07-12 DIAGNOSIS — J449 Chronic obstructive pulmonary disease, unspecified: Secondary | ICD-10-CM | POA: Diagnosis not present

## 2021-07-12 DIAGNOSIS — K31819 Angiodysplasia of stomach and duodenum without bleeding: Principal | ICD-10-CM | POA: Insufficient documentation

## 2021-07-12 DIAGNOSIS — Z20822 Contact with and (suspected) exposure to covid-19: Secondary | ICD-10-CM | POA: Diagnosis not present

## 2021-07-12 DIAGNOSIS — K449 Diaphragmatic hernia without obstruction or gangrene: Secondary | ICD-10-CM | POA: Diagnosis not present

## 2021-07-12 DIAGNOSIS — D509 Iron deficiency anemia, unspecified: Secondary | ICD-10-CM

## 2021-07-12 DIAGNOSIS — Q2730 Arteriovenous malformation, site unspecified: Secondary | ICD-10-CM | POA: Diagnosis present

## 2021-07-12 LAB — CBC
HCT: 20 % — ABNORMAL LOW (ref 36.0–46.0)
Hemoglobin: 5.8 g/dL — ABNORMAL LOW (ref 12.0–15.0)
MCH: 22.7 pg — ABNORMAL LOW (ref 26.0–34.0)
MCHC: 29 g/dL — ABNORMAL LOW (ref 30.0–36.0)
MCV: 78.4 fL — ABNORMAL LOW (ref 80.0–100.0)
Platelets: 431 10*3/uL — ABNORMAL HIGH (ref 150–400)
RBC: 2.55 MIL/uL — ABNORMAL LOW (ref 3.87–5.11)
RDW: 19 % — ABNORMAL HIGH (ref 11.5–15.5)
WBC: 5.4 10*3/uL (ref 4.0–10.5)
nRBC: 0.6 % — ABNORMAL HIGH (ref 0.0–0.2)

## 2021-07-12 LAB — TROPONIN I (HIGH SENSITIVITY): Troponin I (High Sensitivity): 9 ng/L (ref <18)

## 2021-07-12 LAB — PREPARE RBC (CROSSMATCH)

## 2021-07-12 LAB — BASIC METABOLIC PANEL
Anion gap: 6 (ref 5–15)
BUN: 23 mg/dL (ref 8–23)
CO2: 25 mmol/L (ref 22–32)
Calcium: 8.8 mg/dL — ABNORMAL LOW (ref 8.9–10.3)
Chloride: 109 mmol/L (ref 98–111)
Creatinine, Ser: 1.09 mg/dL — ABNORMAL HIGH (ref 0.44–1.00)
GFR, Estimated: 54 mL/min — ABNORMAL LOW (ref >60)
Glucose, Bld: 101 mg/dL — ABNORMAL HIGH (ref 70–99)
Potassium: 3.7 mmol/L (ref 3.5–5.1)
Sodium: 140 mmol/L (ref 135–145)

## 2021-07-12 LAB — HEPATIC FUNCTION PANEL
ALT: 12 U/L (ref 0–44)
AST: 16 U/L (ref 15–41)
Albumin: 3.7 g/dL (ref 3.5–5.0)
Alkaline Phosphatase: 32 U/L — ABNORMAL LOW (ref 38–126)
Bilirubin, Direct: <0.1 mg/dL (ref 0.0–0.2)
Total Bilirubin: 0.3 mg/dL (ref 0.3–1.2)
Total Protein: 6.1 g/dL — ABNORMAL LOW (ref 6.5–8.1)

## 2021-07-12 LAB — IRON AND TIBC
Iron: 27 ug/dL — ABNORMAL LOW (ref 28–170)
Saturation Ratios: 6 % — ABNORMAL LOW (ref 10.4–31.8)
TIBC: 431 ug/dL (ref 250–450)
UIBC: 404 ug/dL

## 2021-07-12 LAB — FOLATE: Folate: 9.7 ng/mL (ref >5.9)

## 2021-07-12 LAB — RESP PANEL BY RT-PCR (FLU A&B, COVID) ARPGX2
Influenza A by PCR: NEGATIVE
Influenza B by PCR: NEGATIVE
SARS Coronavirus 2 by RT PCR: NEGATIVE

## 2021-07-12 LAB — APTT: aPTT: 33 seconds (ref 24–36)

## 2021-07-12 LAB — PROTIME-INR
INR: 1 (ref 0.8–1.2)
Prothrombin Time: 13.5 seconds (ref 11.4–15.2)

## 2021-07-12 LAB — FERRITIN: Ferritin: 11 ng/mL (ref 11–307)

## 2021-07-12 MED ORDER — SODIUM CHLORIDE 0.9 % IV SOLN: 300.0000 mg | Freq: Once | INTRAVENOUS | Status: DC

## 2021-07-12 MED ORDER — VITAMIN B-12 1000 MCG PO TABS
1000.0000 ug | ORAL_TABLET | Freq: Every day | ORAL | Status: DC
  Administered 2021-07-14: 10:00:00 1000 ug via ORAL
  Filled 2021-07-12: qty 1

## 2021-07-12 MED ORDER — SODIUM CHLORIDE 0.9% IV SOLUTION: Freq: Once | INTRAVENOUS | Status: AC

## 2021-07-12 MED ORDER — SIMVASTATIN 20 MG PO TABS
20.0000 mg | ORAL_TABLET | Freq: Every day | ORAL | Status: DC
  Administered 2021-07-12 – 2021-07-13 (×2): 20 mg via ORAL
  Filled 2021-07-12 (×2): qty 1

## 2021-07-12 MED ORDER — SODIUM CHLORIDE 0.9% FLUSH
3.0000 mL | Freq: Two times a day (BID) | INTRAVENOUS | Status: DC
  Administered 2021-07-12 – 2021-07-14 (×5): 3 mL via INTRAVENOUS

## 2021-07-12 MED ORDER — ROPINIROLE HCL 1 MG PO TABS
2.0000 mg | ORAL_TABLET | Freq: Every evening | ORAL | Status: DC | PRN
  Administered 2021-07-12 – 2021-07-13 (×2): 2 mg via ORAL
  Filled 2021-07-12 (×3): qty 2

## 2021-07-12 MED ORDER — TIOTROPIUM BROMIDE MONOHYDRATE 18 MCG IN CAPS
18.0000 ug | ORAL_CAPSULE | Freq: Every day | RESPIRATORY_TRACT | Status: DC
  Administered 2021-07-14: 10:00:00 18 ug via RESPIRATORY_TRACT
  Filled 2021-07-12: qty 5

## 2021-07-12 MED ORDER — ONDANSETRON HCL 4 MG/2ML IJ SOLN: 4.0000 mg | Freq: Four times a day (QID) | INTRAMUSCULAR | Status: DC | PRN

## 2021-07-12 MED ORDER — SUMATRIPTAN SUCCINATE 6 MG/0.5ML ~~LOC~~ SOLN
6.0000 mg | SUBCUTANEOUS | Status: DC | PRN
  Filled 2021-07-12: qty 0.5

## 2021-07-12 MED ORDER — ACETAMINOPHEN 650 MG RE SUPP
650.0000 mg | Freq: Four times a day (QID) | RECTAL | Status: DC | PRN
  Administered 2021-07-13: 650 mg via RECTAL
  Filled 2021-07-12: qty 1

## 2021-07-12 MED ORDER — SODIUM CHLORIDE 0.9% FLUSH: 3.0000 mL | INTRAVENOUS | Status: DC | PRN

## 2021-07-12 MED ORDER — PANTOPRAZOLE 80MG IVPB - SIMPLE MED
80.0000 mg | Freq: Once | INTRAVENOUS | Status: AC
  Administered 2021-07-12: 80 mg via INTRAVENOUS
  Filled 2021-07-12: qty 80

## 2021-07-12 MED ORDER — ACETAMINOPHEN 325 MG PO TABS
650.0000 mg | ORAL_TABLET | Freq: Four times a day (QID) | ORAL | Status: DC | PRN
  Administered 2021-07-12 – 2021-07-14 (×3): 650 mg via ORAL
  Filled 2021-07-12 (×3): qty 2

## 2021-07-12 MED ORDER — ONDANSETRON HCL 4 MG PO TABS: 4.0000 mg | ORAL_TABLET | Freq: Four times a day (QID) | ORAL | Status: DC | PRN

## 2021-07-12 MED ORDER — ENSURE ENLIVE PO LIQD
237.0000 mL | Freq: Two times a day (BID) | ORAL | Status: DC
  Administered 2021-07-14: 237 mL via ORAL

## 2021-07-12 MED ORDER — AMITRIPTYLINE HCL 25 MG PO TABS
25.0000 mg | ORAL_TABLET | Freq: Every day | ORAL | Status: DC
  Administered 2021-07-12 – 2021-07-13 (×2): 25 mg via ORAL
  Filled 2021-07-12 (×2): qty 1

## 2021-07-12 MED ORDER — SODIUM CHLORIDE 0.9 % IV SOLN
10.0000 mL/h | Freq: Once | INTRAVENOUS | Status: AC
  Administered 2021-07-12: 15:00:00 10 mL/h via INTRAVENOUS

## 2021-07-12 MED ORDER — FUROSEMIDE 20 MG PO TABS
20.0000 mg | ORAL_TABLET | Freq: Every day | ORAL | Status: DC
  Administered 2021-07-12 – 2021-07-14 (×2): 20 mg via ORAL
  Filled 2021-07-12 (×2): qty 1

## 2021-07-12 MED ORDER — ALBUTEROL SULFATE (2.5 MG/3ML) 0.083% IN NEBU: 2.5000 mg | INHALATION_SOLUTION | Freq: Four times a day (QID) | RESPIRATORY_TRACT | Status: DC | PRN

## 2021-07-12 MED ORDER — ALBUTEROL SULFATE HFA 108 (90 BASE) MCG/ACT IN AERS: 2.0000 | INHALATION_SPRAY | Freq: Four times a day (QID) | RESPIRATORY_TRACT | Status: DC | PRN

## 2021-07-12 MED ORDER — SODIUM CHLORIDE 0.9 % IV SOLN
300.0000 mg | Freq: Once | INTRAVENOUS | Status: AC
  Administered 2021-07-13: 08:00:00 300 mg via INTRAVENOUS
  Filled 2021-07-12: qty 300

## 2021-07-12 MED ORDER — SODIUM CHLORIDE 0.9 % IV SOLN
250.0000 mL | INTRAVENOUS | Status: DC | PRN
  Administered 2021-07-13: 08:00:00 250 mL via INTRAVENOUS

## 2021-07-12 NOTE — Consult Note (Signed)
Hailey Darby, MD [address]  [place]  [place],  [address]  Main: [phone]  Fax: [phone] Pager: [phone]   Consultation  Referring Provider:     No ref. provider found Primary Care Physician:  Letta Median, MD Primary Gastroenterologist:  Dr. Bonna Gains         Reason for Consultation:     Worsening anemia  Date of Admission:  07/12/2021 Date of Consultation:  07/12/2021         HPI:   Hailey Farrell is a 71 y.o. 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 pylori.  Patient has been receiving parenteral iron therapy.  Her last dose was in December.  She reports that for last 2 to 3 weeks, she has been feeling weak and she thought she may had a stomach bug.  She felt lightheaded today which brought her to the emergency room.  Labs in the ER revealed hemoglobin 5.8, dropped from 8.2 since 06/15/2021.  Her serum ferritin was 8 in 11/22.  She does have history of severe B12 deficiency based on labs from 2019.  B12 has not been checked since then.  Patient denies any black stools, rectal bleeding, abdominal pain.  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  She also underwent video capsule study which was unremarkable Patient is tearful that it keeps happening to her.  Patient is currently receiving first unit of PRBCs  NSAIDs: None  Antiplts/Anticoagulants/Anti thrombotics: None  GI Procedures: Reviewed under procedures tab  Past Medical History:  Diagnosis Date   Anemia    Arthritis    Breast cancer ([place]) 1990   Right   COPD (chronic obstructive pulmonary disease) ([place])    Dyspnea    DOE   Elevated lipids    Hypertension    Lower extremity edema    Migraines    Migraines    Personal history of radiation therapy 1990   Breast   Restless leg syndrome    Rotator cuff injury      Past Surgical History:  Procedure Laterality Date   ABDOMINAL HYSTERECTOMY     BACK SURGERY  08/2017   CERVICAL FUSION   BREAST BIOPSY Right 1990   LUMPECTOMY.  took lymph nodes   BREAST LUMPECTOMY     COLONOSCOPY WITH PROPOFOL N/A 07/20/2017   Procedure: COLONOSCOPY WITH PROPOFOL;  Surgeon: Virgel Manifold, MD;  Location: [facility] ENDOSCOPY;  Service: Endoscopy;  Laterality: N/A;   ENTEROSCOPY N/A 08/08/2017   Procedure: ENTEROSCOPY;  Surgeon: Lin Landsman, MD;  Location: [facility] ENDOSCOPY;  Service: Gastroenterology;  Laterality: N/A;   ENTEROSCOPY N/A 05/07/2020   Procedure: ENTEROSCOPY with Adult colonoscope;  Surgeon: Virgel Manifold, MD;  Location: [facility] ENDOSCOPY;  Service: Endoscopy;  Laterality: N/A;   ENTEROSCOPY N/A 03/17/2021   Procedure: ENTEROSCOPY;  Surgeon: Virgel Manifold, MD;  Location: [facility] ENDOSCOPY;  Service: Endoscopy;  Laterality: N/A;  PUSH   ESOPHAGOGASTRODUODENOSCOPY Left 04/13/2017   Procedure: ESOPHAGOGASTRODUODENOSCOPY (EGD);  Surgeon: Virgel Manifold, MD;  Location: [facility] ENDOSCOPY;  Service: Endoscopy;  Laterality: Left;   ESOPHAGOGASTRODUODENOSCOPY (EGD) WITH PROPOFOL N/A 07/20/2017   Procedure: ESOPHAGOGASTRODUODENOSCOPY (EGD) WITH PROPOFOL;  Surgeon: Virgel Manifold, MD;  Location: [facility] ENDOSCOPY;  Service: Endoscopy;  Laterality: N/A;   ESOPHAGOGASTRODUODENOSCOPY (EGD) WITH PROPOFOL N/A 12/12/2017   Procedure: ESOPHAGOGASTRODUODENOSCOPY (EGD) WITH PROPOFOL;  Surgeon: Pasty Spillers, MD;  Location: [facility] ENDOSCOPY;  Service: Endoscopy;  Laterality: N/A;   ESOPHAGOGASTRODUODENOSCOPY (EGD) WITH PROPOFOL N/A 12/23/2020   Procedure: ESOPHAGOGASTRODUODENOSCOPY (EGD) WITH PROPOFOL;  Surgeon: Pasty Spillers, MD;  Location: [facility] ENDOSCOPY;  Service: Endoscopy;  Laterality: N/A;   GIVENS CAPSULE STUDY N/A 03/10/2021   Procedure: GIVENS CAPSULE STUDY;  Surgeon: Pasty Spillers, MD;  Location: [facility] ENDOSCOPY;  Service:  Endoscopy;  Laterality: N/A;   MEDIAL PARTIAL KNEE REPLACEMENT Left 1990   torn ligament. no knee replacement at that time   PARTIAL HYSTERECTOMY     SHOULDER ARTHROSCOPY WITH OPEN ROTATOR CUFF REPAIR Left 03/01/2016   Procedure: SHOULDER ARTHROSCOPY WITH OPEN ROTATOR CUFF REPAIR;  Surgeon: Juanell Fairly, MD;  Location: [facility] ORS;  Service: Orthopedics;  Laterality: Left;   TOTAL KNEE ARTHROPLASTY Left 02/14/2018   Procedure: TOTAL KNEE ARTHROPLASTY;  Surgeon: Juanell Fairly, MD;  Location: [facility] ORS;  Service: Orthopedics;  Laterality: Left;    Prior to Admission medications   Medication Sig Start Date End Date Taking? Authorizing Provider  acetaminophen (TYLENOL) 650 MG CR tablet Take 650 mg by mouth every 8 (eight) hours as needed for pain.   Yes [provider]  albuterol (VENTOLIN HFA) 108 (90 Base) MCG/ACT inhaler Inhale 2 puffs into the lungs every 6 (six) hours as needed for wheezing.   Yes [provider]  amitriptyline (ELAVIL) 25 MG tablet Take 25 mg by mouth at bedtime. 04/13/20  Yes [provider]  budesonide-formoterol (SYMBICORT) 160-4.5 MCG/ACT inhaler Inhale 2 puffs into the lungs 2 (two) times daily.   Yes [provider]  diphenhydramine-acetaminophen (TYLENOL PM) 25-500 MG TABS tablet Take 1-2 tablets by mouth at bedtime as needed (pain).   Yes [provider]  furosemide (LASIX) 20 MG tablet Take 20 mg daily by mouth.    Yes [provider]  lisinopril (PRINIVIL,ZESTRIL) 20 MG tablet Take 20 mg by mouth daily.   Yes [provider]  potassium chloride (MICRO-K) 10 MEQ CR capsule Take 10 mEq by mouth daily.   Yes [provider]  rOPINIRole (REQUIP) 2 MG tablet Take 2-4 mg by mouth at bedtime as needed (restless leg syndrome).   Yes [provider]  simvastatin (ZOCOR) 20 MG tablet Take 20 mg by mouth daily.    Yes [provider]  vitamin B-12 (CYANOCOBALAMIN) 1000 MCG tablet Take  1,000 mcg by mouth daily.   Yes [provider]    Current Facility-Administered Medications:    0.9 %  sodium chloride infusion, 250 mL, Intravenous, PRN, Agbata, Tochukwu, MD   acetaminophen (TYLENOL) tablet 650 mg, 650 mg, Oral, Q6H PRN **OR** acetaminophen (TYLENOL) suppository 650 mg, 650 mg, Rectal, Q6H PRN, Agbata, Tochukwu, MD   albuterol (PROVENTIL) (2.5 MG/3ML) 0.083% nebulizer solution 2.5 mg, 2.5 mg, Nebulization, Q6H PRN, Agbata, Tochukwu, MD   amitriptyline (ELAVIL) tablet 25 mg, 25 mg, Oral, QHS, Agbata, Tochukwu, MD   [START ON 07/13/2021] feeding supplement (ENSURE ENLIVE / ENSURE PLUS) liquid 237 mL, 237 mL, Oral, BID BM, Abas Leicht, Loel Dubonnet, MD   furosemide (LASIX) tablet 20 mg, 20 mg, Oral, Daily, Agbata, Tochukwu, MD, 20 mg at 07/12/21 1615   [START ON 07/13/2021] iron sucrose (VENOFER) 300 mg in sodium chloride 0.9 % 250 mL IVPB, 300 mg, Intravenous, Once, Una Yeomans, Loel Dubonnet, MD   ondansetron (ZOFRAN) tablet 4 mg, 4 mg, Oral, Q6H PRN **OR** ondansetron (ZOFRAN) injection 4 mg, 4 mg, Intravenous, Q6H PRN, Agbata, Tochukwu, MD  rOPINIRole (REQUIP) tablet 2 mg, 2 mg, Oral, QHS PRN, Agbata, Tochukwu, MD   simvastatin (ZOCOR) tablet 20 mg, 20 mg, Oral, Daily, Agbata, Tochukwu, MD, 20 mg at 07/12/21 1615   sodium chloride flush (NS) 0.9 % injection 3 mL, 3 mL, Intravenous, Q12H, Agbata, Tochukwu, MD, 3 mL at 07/12/21 1441   sodium chloride flush (NS) 0.9 % injection 3 mL, 3 mL, Intravenous, PRN, Agbata, Tochukwu, MD   SUMAtriptan (IMITREX) injection 6 mg, 6 mg, Subcutaneous, Q2H PRN, Agbata, Tochukwu, MD   [START ON 07/13/2021] tiotropium (SPIRIVA) inhalation capsule ([facility] use ONLY) 18 mcg, 18 mcg, Inhalation, Daily, Agbata, Tochukwu, MD   [START ON 07/13/2021] vitamin B-12 (CYANOCOBALAMIN) tablet 1,000 mcg, 1,000 mcg, Oral, Daily, Agbata, Tochukwu, MD  Facility-Administered Medications Ordered in Other Encounters:    0.9 %  sodium chloride infusion, , Intravenous, Continuous,  Sindy Guadeloupe, MD, Stopped at 05/23/21 1452  Family History  Problem Relation Age of Onset   CAD Mother    CAD Sister    Breast cancer Neg Hx      Social History   Tobacco Use   Smoking status: Some Days    Packs/day: 0.25    Years: 50.00    Pack years: 12.50    Types: Cigarettes    Last attempt to quit: 03/13/2016    Years since quitting: 5.3   Smokeless tobacco: Never  Vaping Use   Vaping Use: Never used  Substance Use Topics   Alcohol use: No   Drug use: No    Allergies as of 07/12/2021 - Review Complete 07/12/2021  Allergen Reaction Noted   Latex Rash 02/03/2015    Review of Systems:    All systems reviewed and negative except where noted in HPI.   Physical Exam:  Vital signs in last 24 hours: Temp:  [98.2 F (36.8 C)-98.5 F (36.9 C)] 98.2 F (36.8 C) (02/07 1450) Pulse Rate:  [93-105] 100 (02/07 1450) Resp:  [18-20] 18 (02/07 1450) BP: (90-126)/(53-91) 106/53 (02/07 1450) SpO2:  [99 %-100 %] 99 % (02/07 1450) Weight:  [83.5 kg] 83.5 kg (02/07 1042) Last BM Date: 07/10/21 General:   Pleasant, cooperative in NAD Head:  Normocephalic and atraumatic. Eyes:   No icterus.   Conjunctiva pink. PERRLA. Ears:  Normal auditory acuity. Neck:  Supple; no masses or thyroidomegaly Lungs: Respirations even and unlabored. Lungs clear to auscultation bilaterally.   No wheezes, crackles, or rhonchi.  Heart:  Regular rate and rhythm;  Without murmur, clicks, rubs or gallops Abdomen:  Soft, nondistended, nontender. Normal bowel sounds. No appreciable masses or hepatomegaly.  No rebound or guarding.  Rectal:  Not performed. Msk:  Symmetrical without gross deformities.  Strength generalized weakness Extremities:  Without edema, cyanosis or clubbing. Neurologic:  Alert and oriented x3;  grossly normal neurologically. Skin:  Intact without significant lesions or rashes. Cervical Nodes:  No significant cervical adenopathy. Psych:  Alert and cooperative. Normal affect.  LAB  RESULTS: CBC Latest Ref Rng & Units 07/12/2021 06/15/2021 04/18/2021  WBC 4.0 - 10.5 K/uL 5.4 4.8 4.7  Hemoglobin 12.0 - 15.0 g/dL 5.8(L) 8.2(L) 6.8(L)  Hematocrit 36.0 - 46.0 % 20.0(L) 26.8(L) 21.9(L)  Platelets 150 - 400 K/uL 431(H) 374 394    BMET BMP Latest Ref Rng & Units 07/12/2021 05/06/2020 05/06/2020  Glucose 70 - 99 mg/dL 101(H) 108(H) 131(H)  BUN 8 - 23 mg/dL $Remove'23 10 10  'WtktZrk$ Creatinine 0.44 - 1.00 mg/dL 1.09(H) 0.98 0.98  Sodium 135 - 145 mmol/L 140 138 136  Potassium 3.5 - 5.1 mmol/L 3.7 4.3 4.0  Chloride 98 - 111 mmol/L 109 106 106  CO2 22 - 32 mmol/L $RemoveB'25 23 23  'vxAXMDSm$ Calcium 8.9 - 10.3 mg/dL 8.8(L) 8.4(L) 8.2(L)    LFT Hepatic Function Latest Ref Rng & Units 07/12/2021 05/06/2020 08/06/2017  Total Protein 6.5 - 8.1 g/dL 6.1(L) 6.2(L) 6.0(L)  Albumin 3.5 - 5.0 g/dL 3.7 3.7 3.5  AST 15 - 41 U/L $Remo'16 19 17  'zzCzq$ ALT 0 - 44 U/L 12 14 8(L)  Alk Phosphatase 38 - 126 U/L 32(L) 30(L) 32(L)  Total Bilirubin 0.3 - 1.2 mg/dL 0.3 0.3 0.5  Bilirubin, Direct 0.0 - 0.2 mg/dL <0.1 - -     STUDIES: DG Chest 2 View  Result Date: 07/12/2021 CLINICAL DATA:  Weakness and dizziness EXAM: CHEST - 2 VIEW COMPARISON:  02/17/2018 FINDINGS: Heart size enlarged. Normal vascularity. Lungs are clear without infiltrate or effusion Posterior fusion cervical spine. Thoracic disc degeneration without acute skeletal abnormality. Surgical anchors in the left proximal humerus. IMPRESSION: Cardiac enlargement.  No acute cardiopulmonary abnormality Electronically Signed   By: Franchot Gallo M.D.   On: 07/12/2021 11:40      Impression / Plan:   Hailey Farrell is a 71 y.o. pleasant African-American female with with history of chronic iron deficiency and B12 deficiency anemia, history of small bowel AVMs, treated with APC in the past presents with acute on chronic severe symptomatic iron deficiency anemia with no evidence of active GI bleed  Acute on chronic iron deficiency anemia without active GI bleed History of small bowel  AVMs Patient did not receive iron replacement since December 2022 Recommend 2 units of PRBCs Recommend IV iron, ordered Recheck iron panel, B12 and folate levels Recommend push enteroscopy tomorrow and patient is agreeable Full liquid diet, n.p.o. effective 5 AM tomorrow Patient will need close follow-up with GI and hematology after discharge  I have discussed alternative options, risks & benefits,  which include, but are not limited to, bleeding, infection, perforation,respiratory complication & drug reaction.  The patient agrees with this plan & written consent will be obtained.     Thank you for involving me in the care of this patient.  GI will follow along with you    LOS: 0 days   Sherri Sear, MD  07/12/2021, 5:24 PM    Note: This dictation was prepared with Dragon dictation along with smaller phrase technology. Any transcriptional errors that result from this process are unintentional.

## 2021-07-12 NOTE — ED Triage Notes (Signed)
Pt to ED for weakness and dizziness for the past weeks. States sometimes gets iron infusions when this happens. +shob.  Denies cp.  RR even and unlabored. Denies dark stools.

## 2021-07-12 NOTE — H&P (Addendum)
History and Physical    Patient: Hailey Farrell KVQ:[id number] DOB: 1950/06/13 DOA: 07/12/2021 DOS: the patient was seen and examined on 07/12/2021 PCP: Letta Median, MD  Patient coming from: Home  Chief Complaint:  Chief Complaint  Patient presents with   Weakness    HPI: Hailey Farrell is a 71 y.o. female with medical history significant iron deficiency anemia on iron infusion weekly, COPD, migraine headaches who presents to the ER for evaluation of a 2-week history of weakness, fatigue and feeling like she is going to pass out especially with standing. She also complains of shortness of breath with exertion and states that she feels this way when her blood count gets low she needs a blood transfusion. She denies having any hematuria, hematemesis, no melena or hematochezia.  She denies NSAID use. She denies having any chest pain, no nausea, no vomiting, no fever, no chills, no cough, no leg swelling, no focal deficits or blurred vision. Patient has had extensive work-up for anemia including upper and lower endoscopy as well as a recent push enteroscopy which was done 10/22 by Dr Bonna Gains that showed AVMs that were treated.   Review of Systems: As mentioned in the history of present illness. All other systems reviewed and are negative. Past Medical History:  Diagnosis Date   Anemia    Arthritis    Breast cancer ([place]) 1990   Right   COPD (chronic obstructive pulmonary disease) ([place])    Dyspnea    DOE   Elevated lipids    Hypertension    Lower extremity edema    Migraines    Migraines    Personal history of radiation therapy 1990   Breast   Restless leg syndrome    Rotator cuff injury    Past Surgical History:  Procedure Laterality Date   ABDOMINAL HYSTERECTOMY     BACK SURGERY  08/2017   CERVICAL FUSION   BREAST BIOPSY Right 1990   LUMPECTOMY.  took lymph nodes   BREAST LUMPECTOMY     COLONOSCOPY WITH PROPOFOL N/A 07/20/2017   Procedure: COLONOSCOPY WITH  PROPOFOL;  Surgeon: Virgel Manifold, MD;  Location: [facility] ENDOSCOPY;  Service: Endoscopy;  Laterality: N/A;   ENTEROSCOPY N/A 08/08/2017   Procedure: ENTEROSCOPY;  Surgeon: Lin Landsman, MD;  Location: [facility] ENDOSCOPY;  Service: Gastroenterology;  Laterality: N/A;   ENTEROSCOPY N/A 05/07/2020   Procedure: ENTEROSCOPY with Adult colonoscope;  Surgeon: Virgel Manifold, MD;  Location: [facility] ENDOSCOPY;  Service: Endoscopy;  Laterality: N/A;   ENTEROSCOPY N/A 03/17/2021   Procedure: ENTEROSCOPY;  Surgeon: Virgel Manifold, MD;  Location: [facility] ENDOSCOPY;  Service: Endoscopy;  Laterality: N/A;  PUSH   ESOPHAGOGASTRODUODENOSCOPY Left 04/13/2017   Procedure: ESOPHAGOGASTRODUODENOSCOPY (EGD);  Surgeon: Virgel Manifold, MD;  Location: [facility] ENDOSCOPY;  Service: Endoscopy;  Laterality: Left;   ESOPHAGOGASTRODUODENOSCOPY (EGD) WITH PROPOFOL N/A 07/20/2017   Procedure: ESOPHAGOGASTRODUODENOSCOPY (EGD) WITH PROPOFOL;  Surgeon: Virgel Manifold, MD;  Location: [facility] ENDOSCOPY;  Service: Endoscopy;  Laterality: N/A;   ESOPHAGOGASTRODUODENOSCOPY (EGD) WITH PROPOFOL N/A 12/12/2017   Procedure: ESOPHAGOGASTRODUODENOSCOPY (EGD) WITH PROPOFOL;  Surgeon: Virgel Manifold, MD;  Location: [facility] ENDOSCOPY;  Service: Endoscopy;  Laterality: N/A;   ESOPHAGOGASTRODUODENOSCOPY (EGD) WITH PROPOFOL N/A 12/23/2020   Procedure: ESOPHAGOGASTRODUODENOSCOPY (EGD) WITH PROPOFOL;  Surgeon: Virgel Manifold, MD;  Location: [facility] ENDOSCOPY;  Service: Endoscopy;  Laterality: N/A;   GIVENS CAPSULE STUDY N/A 03/10/2021   Procedure: GIVENS CAPSULE STUDY;  Surgeon: Virgel Manifold, MD;  Location: [facility] ENDOSCOPY;  Service: Endoscopy;  Laterality: N/A;   MEDIAL PARTIAL KNEE REPLACEMENT Left 1990   torn ligament. no knee replacement at that time   PARTIAL HYSTERECTOMY     SHOULDER ARTHROSCOPY WITH OPEN ROTATOR CUFF REPAIR Left 03/01/2016   Procedure: SHOULDER ARTHROSCOPY WITH OPEN ROTATOR CUFF REPAIR;   Surgeon: Thornton Park, MD;  Location: [facility] ORS;  Service: Orthopedics;  Laterality: Left;   TOTAL KNEE ARTHROPLASTY Left 02/14/2018   Procedure: TOTAL KNEE ARTHROPLASTY;  Surgeon: Thornton Park, MD;  Location: [facility] ORS;  Service: Orthopedics;  Laterality: Left;   Social History:  reports that she has been smoking cigarettes. She has a 12.50 pack-year smoking history. She has never used smokeless tobacco. She reports that she does not drink alcohol and does not use drugs.  Allergies  Allergen Reactions   Latex Rash    Welts over body    Family History  Problem Relation Age of Onset   CAD Mother    CAD Sister    Breast cancer Neg Hx     Prior to Admission medications   Medication Sig Start Date End Date Taking? Authorizing Provider  albuterol (VENTOLIN HFA) 108 (90 Base) MCG/ACT inhaler Inhale 2 puffs into the lungs every 6 (six) hours as needed for wheezing.    [provider]  amitriptyline (ELAVIL) 25 MG tablet Take 25 mg by mouth at bedtime. 04/13/20   [provider]  budesonide-formoterol (SYMBICORT) 160-4.5 MCG/ACT inhaler Inhale 2 puffs into the lungs daily as needed (wheezing or shortness of breath).  Patient not taking: Reported on 04/18/2021    [provider]  diclofenac Sodium (VOLTAREN) 1 % GEL Apply topically daily as needed.    [provider]  furosemide (LASIX) 20 MG tablet Take 20 mg daily by mouth.     [provider]  lisinopril (PRINIVIL,ZESTRIL) 20 MG tablet Take 20 mg by mouth daily.    [provider]  potassium chloride (MICRO-K) 10 MEQ CR capsule Take 10 mEq by mouth daily.    [provider]  rOPINIRole (REQUIP) 0.5 MG tablet Take 2 mg by mouth at bedtime as needed (restless leg syndrome).    [provider]  simvastatin (ZOCOR) 20 MG tablet Take 20 mg by mouth daily.     [provider]  SUMAtriptan (IMITREX) 6 MG/0.5ML SOLN injection Inject 6 mg into the skin every 2 (two)  hours as needed for migraine or headache. May repeat in 2 hours if headache persists or recurs.    [provider]  tiotropium (SPIRIVA) 18 MCG inhalation capsule Place 18 mcg into inhaler and inhale daily.    [provider]  vitamin B-12 (CYANOCOBALAMIN) 1000 MCG tablet Take 1,000 mcg by mouth daily.    [provider]    Physical Exam: Vitals:   07/12/21 1041 07/12/21 1042 07/12/21 1353 07/12/21 1403  BP: (!) 114/91  90/78 126/74  Pulse: (!) 105  93 97  Resp: 20  18   Temp: 98.5 F (36.9 C)   98.4 F (36.9 C)  TempSrc:    Oral  SpO2: 100%  100%   Weight:  83.5 kg    Height:  5\' 4"  (1.626 m)     Physical Exam Vitals and nursing note reviewed.  Constitutional:      Appearance: Normal appearance. She is normal weight.  HENT:     Head: Normocephalic and atraumatic.     Nose: Nose normal.     Mouth/Throat:     Mouth: Mucous membranes  are moist.  Eyes:     Comments: Pale conjunctiva  Cardiovascular:     Rate and Rhythm: Tachycardia present.  Pulmonary:     Effort: Pulmonary effort is normal.     Breath sounds: Normal breath sounds.  Abdominal:     General: Abdomen is flat. Bowel sounds are normal.     Palpations: Abdomen is soft.  Genitourinary:    Rectum: Guaiac result negative.  Musculoskeletal:        General: Normal range of motion.     Cervical back: Normal range of motion and neck supple.  Skin:    General: Skin is warm and dry.  Neurological:     General: No focal deficit present.     Mental Status: She is alert.  Psychiatric:        Mood and Affect: Mood normal.        Behavior: Behavior normal.     Data Reviewed: Notes from primary care and specialist visits, past discharge summaries. Prior diagnostic testing as applicable to current admission diagnoses Updated medications and problem lists for reconciliation ED course, including vitals, labs, imaging, treatment and response to treatment Triage notes and ED providers  notes  There are no new results to review at this time.  Assessment and Plan: Principal Problem:   Symptomatic anemia Active Problems:   AVM (arteriovenous malformation) of small bowel, acquired with hemorrhage   COPD, mild (HCC)   Essential hypertension    Symptomatic iron deficiency anemia Patient presents for evaluation near syncope, weakness and increased lethargy and is found to have a hemoglobin of 5.8 g/dl from baseline of 8.2 g/dl from 3 weeks ago She gets weekly iron infusions and last treatment was 3 weeks prior to this admission We will transfuse 2 units of packed RBC She had a push enteroscopy which showed AVMs We will consult GI    COPD Not acutely exacerbated Continue as needed bronchodilator therapy    Hypertension Hold lisinopril since patient is normotensive    Advance Care Planning:   Code Status: Full Code   Consults: Gastroenterology  Family Communication: Greater than 50% of time spent discussing patient's condition and plan of care with her and her husband at the bedside.  All questions and concerns have been addressed.  They verbalized understanding and agree with the plan.  Severity of Illness: The appropriate patient status for this patient is OBSERVATION. Observation status is judged to be reasonable and necessary in order to provide the required intensity of service to ensure the patient's safety. The patient's presenting symptoms, physical exam findings, and initial radiographic and laboratory data in the context of their medical condition is felt to place them at decreased risk for further clinical deterioration. Furthermore, it is anticipated that the patient will be medically stable for discharge from the hospital within 2 midnights of admission.   Author: Collier Bullock, MD 07/12/2021 2:16 PM  For on call review [email or web address].

## 2021-07-12 NOTE — ED Provider Notes (Signed)
[facility] Provider Note    Event Date/Time   First MD Initiated Contact with Patient 07/12/21 1044     (approximate)   History   Weakness   HPI  Hailey Farrell is a 71 y.o. female with past medical history of iron deficiency anemia, COPD, hyperlipidemia, migraines who presents with lightheadedness.  Patient notes that over the last week she has had feeling that she was in a pass out upon standing.  Also has dyspnea on exertion.  She notes that usually when she feels this way she needs a blood transfusion because her hemoglobin is low.  Patient has had ongoing issues with GI bleeding.  Has never had melena or bright red blood per rectum before and denies this currently.  Denies hematemesis.  She denies nausea vomiting pain fevers chills cough.  No chest pain.  Patient is currently receiving iron infusions last was about 2 weeks ago.  Patient's most recent gastroenterology note from Dr.Tahiliani-recently had a push enteroscopy in October of last year where AVMs were seen and treated.    Past Medical History:  Diagnosis Date   Anemia    Arthritis    Breast cancer ([place]) 1990   Right   COPD (chronic obstructive pulmonary disease) (HCC)    Dyspnea    DOE   Elevated lipids    Hypertension    Lower extremity edema    Migraines    Migraines    Personal history of radiation therapy 1990   Breast   Restless leg syndrome    Rotator cuff injury     Patient Active Problem List   Diagnosis Date Noted   Angiodysplasia of upper gastrointestinal tract    Gastric erythema    Gastric nodule    AVM (arteriovenous malformation) of small bowel, acquired with hemorrhage    Symptomatic anemia 05/06/2020   History of total knee arthroplasty, left 02/14/2018   History of total knee arthroplasty 02/14/2018   Intestinal metaplasia of gastric mucosa    Columnar-lined esophagus    Hiatal hernia    H pylori ulcer    Iron deficiency anemia 08/19/2017   Acute  post-hemorrhagic anemia 08/06/2017   Acute peptic ulcer of stomach    Common duodenal ulcer    Personal history of colonic polyps    Polyp of sigmoid colon    Internal hemorrhoids    Intestinal lump    GIB (gastrointestinal bleeding) 04/11/2017   Cervical spondylosis with myelopathy 11/24/2016   COPD, mild (HCC) 11/24/2016   Tobacco use 11/24/2016   Full thickness rotator cuff tear 07/12/2016   Osteoarthritis of knee 07/12/2016   S/P rotator cuff repair 03/01/2016   Essential hypertension 07/22/2015   Intractable chronic migraine without aura and without status migrainosus 07/22/2015   Obesity (BMI 30.0-34.9) 07/22/2015     Physical Exam  Triage Vital Signs: ED Triage Vitals  Enc Vitals Group     BP 07/12/21 1041 (!) 114/91     Pulse Rate 07/12/21 1041 (!) 105     Resp 07/12/21 1041 20     Temp 07/12/21 1041 98.5 F (36.9 C)     Temp src --      SpO2 07/12/21 1041 100 %     Weight 07/12/21 1042 184 lb (83.5 kg)     Height 07/12/21 1042 5\' 4"  (1.626 m)     Head Circumference --      Peak Flow --      Pain Score 07/12/21 1042 0  Pain Loc --      Pain Edu? --      Excl. in [place]? --     Most recent vital signs: Vitals:   07/12/21 1041  BP: (!) 114/91  Pulse: (!) 105  Resp: 20  Temp: 98.5 F (36.9 C)  SpO2: 100%     General: Awake, patient is tearful CV:  Good peripheral perfusion.  Lower extremity edema Resp:  Normal effort.  Lungs are clear Abd:  No distention.  Soft and nontender throughout Neuro:             Awake, Alert, Oriented x 3  Other:  Brown stool on rectal exam   ED Results / Procedures / Treatments  Labs (all labs ordered are listed, but only abnormal results are displayed) Labs Reviewed  CBC - Abnormal; Notable for the following components:      Result Value   RBC 2.55 (*)    Hemoglobin 5.8 (*)    HCT 20.0 (*)    MCV 78.4 (*)    MCH 22.7 (*)    MCHC 29.0 (*)    RDW 19.0 (*)    Platelets 431 (*)    nRBC 0.6 (*)    All other  components within normal limits  BASIC METABOLIC PANEL - Abnormal; Notable for the following components:   Glucose, Bld 101 (*)    Creatinine, Ser 1.09 (*)    Calcium 8.8 (*)    GFR, Estimated 54 (*)    All other components within normal limits  RESP PANEL BY RT-PCR (FLU A&B, COVID) ARPGX2  HEPATIC FUNCTION PANEL  PROTIME-INR  APTT  PREPARE RBC (CROSSMATCH)  TYPE AND SCREEN  TROPONIN I (HIGH SENSITIVITY)     EKG  EKG interpreted by myself, sinus tachycardia, somewhat diffuse ST depressions most apparent in the inferior leads, normal intervals   RADIOLOGY I reviewed the CXR which does not show any acute cardiopulmonary process; agree with radiology report     PROCEDURES:  Critical Care performed: Yes, see critical care procedure note(s)  .1-3 Lead EKG Interpretation Performed by: Rada Hay, MD Authorized by: Rada Hay, MD     Interpretation: normal     ECG rate assessment: normal     Rhythm: sinus rhythm     Ectopy: none     Conduction: normal   .Critical Care Performed by: Rada Hay, MD Authorized by: Rada Hay, MD   Critical care provider statement:    Critical care time (minutes):  30   Critical care was time spent personally by me on the following activities:  Development of treatment plan with patient or surrogate, discussions with consultants, evaluation of patient's response to treatment, examination of patient, ordering and review of laboratory studies, ordering and review of radiographic studies, ordering and performing treatments and interventions, pulse oximetry, re-evaluation of patient's condition and review of old charts  The patient is on the cardiac monitor to evaluate for evidence of arrhythmia and/or significant heart rate changes.   MEDICATIONS ORDERED IN ED: Medications  0.9 %  sodium chloride infusion (has no administration in time range)  pantoprazole (PROTONIX) 80 mg /NS 100 mL IVPB (has no administration  in time range)     IMPRESSION / MDM / ASSESSMENT AND PLAN / ED COURSE  I reviewed the triage vital signs and the nursing notes.  Differential diagnosis includes, but is not limited to, symptomatic anemia, ACS, CHF, pneumonia  Patient is a 71 year old female with prior history of GI bleeding secondary to AVMs who presents with dyspnea on exertion and presyncope.  Symptoms been going on for the last week.  Normally she says when she feels like this she needs either a blood or iron transfusion.  Patient's mildly tachycardic but blood pressures within normal limits.  She is well-appearing.  She denies any black stools or hematemesis.  On rectal exam she has brown stool.  Her hemoglobin is 5.8 today was about 8 3 weeks ago.  Reviewed her last GI note from October, notes that she had a push enteroscopy with AVMs previously treated.  Suspect that this is from ongoing GI losses patient denies ever having black stools or blood in her stool previously when she was anemic.  We will give her dose of Protonix.  We will transfuse 2 units of blood.  Patient consented.  Will admit to the hospital service.  Discussed with GI on-call.        FINAL CLINICAL IMPRESSION(S) / ED DIAGNOSES   Final diagnoses:  Symptomatic anemia     Rx / DC Orders   ED Discharge Orders     None        Note:  This document was prepared using Dragon voice recognition software and may include unintentional dictation errors.   Rada Hay, MD 07/12/21 1200

## 2021-07-13 ENCOUNTER — Observation Stay: Payer: Medicare Other | Admitting: Anesthesiology

## 2021-07-13 ENCOUNTER — Encounter: Payer: Self-pay | Admitting: Internal Medicine

## 2021-07-13 ENCOUNTER — Encounter
Admission: EM | Disposition: A | Discharge status: Home / Self Care | Payer: Self-pay | Source: Home / Self Care | Attending: Emergency Medicine

## 2021-07-13 DIAGNOSIS — I10 Essential (primary) hypertension: Secondary | ICD-10-CM

## 2021-07-13 DIAGNOSIS — K31819 Angiodysplasia of stomach and duodenum without bleeding: Secondary | ICD-10-CM | POA: Diagnosis not present

## 2021-07-13 DIAGNOSIS — D649 Anemia, unspecified: Secondary | ICD-10-CM | POA: Diagnosis not present

## 2021-07-13 DIAGNOSIS — K5521 Angiodysplasia of colon with hemorrhage: Secondary | ICD-10-CM | POA: Diagnosis not present

## 2021-07-13 DIAGNOSIS — D500 Iron deficiency anemia secondary to blood loss (chronic): Secondary | ICD-10-CM | POA: Diagnosis not present

## 2021-07-13 HISTORY — PX: ENTEROSCOPY: SHX5533

## 2021-07-13 LAB — CBC
HCT: 24.1 % — ABNORMAL LOW (ref 36.0–46.0)
Hemoglobin: 7.5 g/dL — ABNORMAL LOW (ref 12.0–15.0)
MCH: 25.2 pg — ABNORMAL LOW (ref 26.0–34.0)
MCHC: 31.1 g/dL (ref 30.0–36.0)
MCV: 80.9 fL (ref 80.0–100.0)
Platelets: 374 10*3/uL (ref 150–400)
RBC: 2.98 MIL/uL — ABNORMAL LOW (ref 3.87–5.11)
RDW: 18.5 % — ABNORMAL HIGH (ref 11.5–15.5)
WBC: 6.1 10*3/uL (ref 4.0–10.5)
nRBC: 1 % — ABNORMAL HIGH (ref 0.0–0.2)

## 2021-07-13 LAB — TYPE AND SCREEN
ABO/RH(D): O POS
Antibody Screen: NEGATIVE
Unit division: 0
Unit division: 0

## 2021-07-13 LAB — BPAM RBC
Blood Product Expiration Date: 202302092359
Blood Product Expiration Date: 202303082359
ISSUE DATE / TIME: 202302071424
ISSUE DATE / TIME: 202302071830
Unit Type and Rh: 5100
Unit Type and Rh: 9500

## 2021-07-13 LAB — BASIC METABOLIC PANEL
Anion gap: 5 (ref 5–15)
BUN: 18 mg/dL (ref 8–23)
CO2: 27 mmol/L (ref 22–32)
Calcium: 8.5 mg/dL — ABNORMAL LOW (ref 8.9–10.3)
Chloride: 105 mmol/L (ref 98–111)
Creatinine, Ser: 0.93 mg/dL (ref 0.44–1.00)
GFR, Estimated: >60 mL/min (ref >60)
Glucose, Bld: 90 mg/dL (ref 70–99)
Potassium: 3.8 mmol/L (ref 3.5–5.1)
Sodium: 137 mmol/L (ref 135–145)

## 2021-07-13 LAB — VITAMIN B12: Vitamin B-12: 2562 pg/mL — ABNORMAL HIGH (ref 180–914)

## 2021-07-13 SURGERY — ENTEROSCOPY
Anesthesia: General

## 2021-07-13 MED ORDER — ONDANSETRON HCL 4 MG/2ML IJ SOLN
INTRAMUSCULAR | Status: AC
  Administered 2021-07-13: 4 mg via INTRAVENOUS
  Filled 2021-07-13: qty 2

## 2021-07-13 MED ORDER — PROPOFOL 10 MG/ML IV BOLUS
INTRAVENOUS | Status: DC | PRN
Start: 2021-07-13 — End: 2021-07-13
  Administered 2021-07-13: 70 mg via INTRAVENOUS

## 2021-07-13 MED ORDER — FENTANYL CITRATE PF 50 MCG/ML IJ SOSY
PREFILLED_SYRINGE | INTRAMUSCULAR | Status: AC
  Administered 2021-07-13: 25 ug via INTRAVENOUS
  Filled 2021-07-13: qty 1

## 2021-07-13 MED ORDER — PROPOFOL 500 MG/50ML IV EMUL
INTRAVENOUS | Status: DC | PRN
  Administered 2021-07-13: 150 ug/kg/min via INTRAVENOUS

## 2021-07-13 MED ORDER — DICLOFENAC SODIUM 1 % EX GEL
2.0000 g | Freq: Four times a day (QID) | CUTANEOUS | Status: DC
  Administered 2021-07-13 – 2021-07-14 (×4): 2 g via TOPICAL
  Filled 2021-07-13: qty 100

## 2021-07-13 MED ORDER — ADULT MULTIVITAMIN W/MINERALS CH
1.0000 | ORAL_TABLET | Freq: Every day | ORAL | Status: DC
  Administered 2021-07-14: 1 via ORAL
  Filled 2021-07-13: qty 1

## 2021-07-13 MED ORDER — FENTANYL CITRATE PF 50 MCG/ML IJ SOSY: 25.0000 ug | PREFILLED_SYRINGE | Freq: Once | INTRAMUSCULAR | Status: AC

## 2021-07-13 MED ORDER — SODIUM CHLORIDE 0.9 % IV SOLN
300.0000 mg | Freq: Once | INTRAVENOUS | Status: AC
  Administered 2021-07-14: 300 mg via INTRAVENOUS
  Filled 2021-07-13: qty 300

## 2021-07-13 MED ORDER — PROPOFOL 10 MG/ML IV BOLUS
INTRAVENOUS | Status: AC
  Filled 2021-07-13: qty 20

## 2021-07-13 MED ORDER — STERILE WATER FOR IRRIGATION IR SOLN
Status: DC | PRN
  Administered 2021-07-13: 50 mL

## 2021-07-13 NOTE — Anesthesia Procedure Notes (Signed)
Procedure Name: MAC Date/Time: 07/13/2021 12:25 PM Performed by: Biagio Borg, CRNA Pre-anesthesia Checklist: Patient identified, Emergency Drugs available, Suction available, Patient being monitored and Timeout performed Patient Re-evaluated:Patient Re-evaluated prior to induction Oxygen Delivery Method: Nasal cannula Induction Type: IV induction Placement Confirmation: positive ETCO2 and CO2 detector

## 2021-07-13 NOTE — Assessment & Plan Note (Signed)
xxxx 

## 2021-07-13 NOTE — Transfer of Care (Signed)
Immediate Anesthesia Transfer of Care Note  Patient: Hailey Farrell  Procedure(s) Performed: ENTEROSCOPY  Patient Location: PACU and Endoscopy Unit  Anesthesia Type:General  Level of Consciousness: drowsy  Airway & Oxygen Therapy: Patient Spontanous Breathing  Post-op Assessment: Report given to RN and Post -op Vital signs reviewed and stable  Post vital signs: Reviewed and stable  Last Vitals:  Vitals Value Taken Time  BP 104/67 07/13/21 1250  Temp    Pulse 94 07/13/21 1250  Resp 20 07/13/21 1250  SpO2 93 % 07/13/21 1250  Vitals shown include unvalidated device data.  Last Pain:  Vitals:   07/13/21 1250  TempSrc:   PainSc: Asleep         Complications: No notable events documented.

## 2021-07-13 NOTE — Anesthesia Preprocedure Evaluation (Signed)
Anesthesia Evaluation  Patient identified by MRN, date of birth, ID band Patient awake    Reviewed: Allergy & Precautions, NPO status , Patient's Chart, lab work & pertinent test results  History of Anesthesia Complications Negative for: history of anesthetic complications  Airway Mallampati: III  TM Distance: >3 FB Neck ROM: full    Dental  (+) Poor Dentition, Missing   Pulmonary shortness of breath and with exertion, COPD, Current Smoker and Patient abstained from smoking.,    Pulmonary exam normal        Cardiovascular hypertension, (-) anginaNormal cardiovascular exam     Neuro/Psych  Headaches, negative psych ROS   GI/Hepatic Neg liver ROS, hiatal hernia, PUD, GERD  Controlled,  Endo/Other  negative endocrine ROS  Renal/GU negative Renal ROS  negative genitourinary   Musculoskeletal  (+) Arthritis ,   Abdominal   Peds  Hematology  (+) Blood dyscrasia, anemia ,   Anesthesia Other Findings Past Medical History: No date: Anemia No date: Arthritis 1990: Breast cancer ([place])     Comment:  Right No date: COPD (chronic obstructive pulmonary disease) (HCC) No date: Dyspnea     Comment:  DOE No date: Elevated lipids No date: Hypertension No date: Lower extremity edema No date: Migraines No date: Migraines 1990: Personal history of radiation therapy     Comment:  Breast No date: Restless leg syndrome No date: Rotator cuff injury  Past Surgical History: No date: ABDOMINAL HYSTERECTOMY 08/2017: BACK SURGERY     Comment:  CERVICAL FUSION 1990: BREAST BIOPSY; Right     Comment:  LUMPECTOMY.  took lymph nodes No date: BREAST LUMPECTOMY 07/20/2017: COLONOSCOPY WITH PROPOFOL; N/A     Comment:  Procedure: COLONOSCOPY WITH PROPOFOL;  Surgeon:               Virgel Manifold, MD;  Location: [facility] ENDOSCOPY;                Service: Endoscopy;  Laterality: N/A; 08/08/2017: ENTEROSCOPY; N/A     Comment:   Procedure: ENTEROSCOPY;  Surgeon: Lin Landsman,               MD;  Location: [facility] ENDOS[facility];  Service:               Gastroenterology;  Laterality: N/A; 05/07/2020: ENTEROSCOPY; N/A     Comment:  Procedure: ENTEROSCOPY with Adult colonoscope;  Surgeon:              Virgel Manifold, MD;  Location: [facility] END[facility];                Service: Endoscopy;  Laterality: N/A; 03/17/2021: ENTEROSCOPY; N/A     Comment:  Procedure: ENTEROSCOPY;  Surgeon: Virgel Manifold,               MD;  Location: [facility] END[facility];  Service: Endoscopy;                Laterality: N/A;  PUSH 04/13/2017: ESOPHAGOGASTRODUODENOSCOPY; Left     Comment:  Procedure: ESOPHAGOGASTRODUODENOSCOPY (EGD);  Surgeon:               Virgel Manifold, MD;  Location: [facility] ENDOSCOPY;                Service: Endoscopy;  Laterality: Left; 07/20/2017: ESOPHAGOGASTRODUODENOSCOPY (EGD) WITH PROPOFOL; N/A     Comment:  Procedure: ESOPHAGOGASTRODUODENOSCOPY (EGD) WITH               PROPOFOL;  Surgeon: Virgel Manifold,  MD;  Location:               [facility];  Service: Endoscopy;  Laterality: N/A; 12/12/2017: ESOPHAGOGASTRODUODENOSCOPY (EGD) WITH PROPOFOL; N/A     Comment:  Procedure: ESOPHAGOGASTRODUODENOSCOPY (EGD) WITH               PROPOFOL;  Surgeon: Virgel Manifold, MD;  Location:               [facility] END[facility];  Service: Endoscopy;  Laterality: N/A; 12/23/2020: ESOPHAGOGASTRODUODENOSCOPY (EGD) WITH PROPOFOL; N/A     Comment:  Procedure: ESOPHAGOGASTRODUODENOSCOPY (EGD) WITH               PROPOFOL;  Surgeon: Virgel Manifold, MD;  Location:               [facility] END[facility];  Service: Endoscopy;  Laterality: N/A; 03/10/2021: GIVENS CAPSULE STUDY; N/A     Comment:  Procedure: GIVENS CAPSULE STUDY;  Surgeon: Virgel Manifold, MD;  Location: [facility] END[facility];  Service:               Endoscopy;  Laterality: N/A; 1990: MEDIAL PARTIAL KNEE REPLACEMENT; Left     Comment:  torn ligament. no knee  replacement at that time No date: PARTIAL HYSTERECTOMY 03/01/2016: SHOULDER ARTHROSCOPY WITH OPEN ROTATOR CUFF REPAIR; Left     Comment:  Procedure: SHOULDER ARTHROSCOPY WITH OPEN ROTATOR CUFF               REPAIR;  Surgeon: Thornton Park, MD;  Location: [facility]               ORS;  Service: Orthopedics;  Laterality: Left; 02/14/2018: TOTAL KNEE ARTHROPLASTY; Left     Comment:  Procedure: TOTAL KNEE ARTHROPLASTY;  Surgeon: Thornton Park, MD;  Location: [facility] ORS;  Service: Orthopedics;                Laterality: Left;  BMI    Body Mass Index: 31.58 kg/m      Reproductive/Obstetrics negative OB ROS                             Anesthesia Physical Anesthesia Plan  ASA: 3  Anesthesia Plan: General   Post-op Pain Management:    Induction: Intravenous  PONV Risk Score and Plan: Propofol infusion and TIVA  Airway Management Planned: Natural Airway and Nasal Cannula  Additional Equipment:   Intra-op Plan:   Post-operative Plan:   Informed Consent: I have reviewed the patients History and Physical, chart, labs and discussed the procedure including the risks, benefits and alternatives for the proposed anesthesia with the patient or authorized representative who has indicated his/her understanding and acceptance.     Dental Advisory Given  Plan Discussed with: Anesthesiologist, CRNA and Surgeon  Anesthesia Plan Comments: (Patient consented for risks of anesthesia including but not limited to:  - adverse reactions to medications - risk of airway placement if required - damage to eyes, teeth, lips or other oral mucosa - nerve damage due to positioning  - sore throat or hoarseness - Damage to heart, brain, nerves, lungs, other parts of body or loss of life  Patient voiced understanding.)        Anesthesia Quick Evaluation

## 2021-07-13 NOTE — Progress Notes (Signed)
PROGRESS NOTE    Hailey Farrell  VZD:[id number] DOB: 1951-01-05 DOA: 07/12/2021 PCP: Letta Median, MD    Brief Narrative:   Hailey Farrell is a 71 y.o. female with medical history significant iron deficiency anemia on iron infusion weekly, COPD, migraine headaches who presents to the ER for evaluation of a 2-week history of weakness, fatigue and feeling like she is going to pass out especially with standing.. Patient had hemoglobin 5.8, received hemoglobin transfusion.  EGD was performed 2/8, treated for AVM.  Assessment & Plan:   Principal Problem:   Symptomatic anemia Active Problems:   AVM (arteriovenous malformation) of small bowel, acquired with hemorrhage   COPD, mild (HCC)   Essential hypertension  Acute on chronic blood loss anemia. Iron deficient anemia. Patient received 2 units of PRBC, hemoglobin today 7.5.  Patient also received IV iron today. Plan to give another dose tomorrow. B12 level adequate. EGD was performed today, treated for gastric AVM.  GI want to keep patient overnight again make sure patient hemoglobin stable. Recheck a CBC tomorrow.  COPD  Stable  Essential hypertension Stable.    DVT prophylaxis: SCDs Code Status: full Family Communication:  Disposition Plan:    Status is: Observation The patient will require care spanning > 2 midnights and should be moved to inpatient because: Severity of her disease.          I/O last 3 completed shifts: In: 1115.3 [P.O.:240; I.V.:3.3; Blood:772; IV Piggyback:100] Out: 0  No intake/output data recorded.     Consultants:  GI  Procedures: EGD  Antimicrobials: None  Subjective: Doing much better today, no dizziness. She has no abdominal pain, nausea or vomiting.  No black stool. No fever or chills. No short of breath or cough  Objective: Vitals:   07/13/21 1300 07/13/21 1310 07/13/21 1320 07/13/21 1331  BP: (!) 146/77 129/76 (!) 141/75 136/82  Pulse: 94 81 81 82  Resp: 17 18  (!) 22 16  Temp:      TempSrc:      SpO2: 95% 94% 96% 92%  Weight:      Height:        Intake/Output Summary (Last 24 hours) at 07/13/2021 1339 Last data filed at 07/12/2021 2142 Gross per 24 hour  Intake 1115.33 ml  Output 0 ml  Net 1115.33 ml   Filed Weights   07/12/21 1042  Weight: 83.5 kg    Examination:  General exam: Appears calm and comfortable  Respiratory system: Clear to auscultation. Respiratory effort normal. Cardiovascular system: S1 & S2 heard, RRR. No JVD, murmurs, rubs, gallops or clicks. No pedal edema. Gastrointestinal system: Abdomen is nondistended, soft and nontender. No organomegaly or masses felt. Normal bowel sounds heard. Central nervous system: Alert and oriented. No focal neurological deficits. Extremities: Symmetric 5 x 5 power. Skin: No rashes, lesions or ulcers Psychiatry: Judgement and insight appear normal. Mood & affect appropriate.     Data Reviewed: I have personally reviewed following labs and imaging studies  CBC: Recent Labs  Lab 07/12/21 1043 07/13/21 0416  WBC 5.4 6.1  HGB 5.8* 7.5*  HCT 20.0* 24.1*  MCV 78.4* 80.9  PLT 431* 295   Basic Metabolic Panel: Recent Labs  Lab 07/12/21 1043 07/13/21 0416  NA 140 137  K 3.7 3.8  CL 109 105  CO2 25 27  GLUCOSE 101* 90  BUN 23 18  CREATININE 1.09* 0.93  CALCIUM 8.8* 8.5*   GFR: Estimated Creatinine Clearance: 58 mL/min (by C-G formula based  on SCr of 0.93 mg/dL). Liver Function Tests: Recent Labs  Lab 07/12/21 1043  AST 16  ALT 12  ALKPHOS 32*  BILITOT 0.3  PROT 6.1*  ALBUMIN 3.7   No results for input(s): LIPASE, AMYLASE in the last 168 hours. No results for input(s): AMMONIA in the last 168 hours. Coagulation Profile: Recent Labs  Lab 07/12/21 2154  INR 1.0   Cardiac Enzymes: No results for input(s): CKTOTAL, CKMB, CKMBINDEX, TROPONINI in the last 168 hours. BNP (last 3 results) No results for input(s): PROBNP in the last 8760 hours. HbA1C: No results  for input(s): HGBA1C in the last 72 hours. CBG: No results for input(s): GLUCAP in the last 168 hours. Lipid Profile: No results for input(s): CHOL, HDL, LDLCALC, TRIG, CHOLHDL, LDLDIRECT in the last 72 hours. Thyroid Function Tests: No results for input(s): TSH, T4TOTAL, FREET4, T3FREE, THYROIDAB in the last 72 hours. Anemia Panel: Recent Labs    07/12/21 1043 07/13/21 0416  VITAMINB12  --  2,562*  FOLATE 9.7  --   FERRITIN 11  --   TIBC 431  --   IRON 27*  --    Sepsis Labs: No results for input(s): PROCALCITON, LATICACIDVEN in the last 168 hours.  Recent Results (from the past 240 hour(s))  Resp Panel by RT-PCR (Flu A&B, Covid) Nasopharyngeal Swab     Status: None   Collection Time: 07/12/21 12:19 PM   Specimen: Nasopharyngeal Swab; Nasopharyngeal(NP) swabs in vial transport medium  Result Value Ref Range Status   SARS Coronavirus 2 by RT PCR NEGATIVE NEGATIVE Final    Comment: (NOTE) SARS-CoV-2 target nucleic acids are NOT DETECTED.  The SARS-CoV-2 RNA is generally detectable in upper respiratory specimens during the acute phase of infection. The lowest concentration of SARS-CoV-2 viral copies this assay can detect is 138 copies/mL. A negative result does not preclude SARS-Cov-2 infection and should not be used as the sole basis for treatment or other patient management decisions. A negative result may occur with  improper specimen collection/handling, submission of specimen other than nasopharyngeal swab, presence of viral mutation(s) within the areas targeted by this assay, and inadequate number of viral copies(<138 copies/mL). A negative result must be combined with clinical observations, patient history, and epidemiological information. The expected result is Negative.  Fact Sheet for Patients:  EntrepreneurPulse.com.au  Fact Sheet for Healthcare Providers:  IncredibleEmployment.be  This test is no t yet approved or cleared  by the [place] FDA and  has been authorized for detection and/or diagnosis of SARS-CoV-2 by FDA under an Emergency Use Authorization (EUA). This EUA will remain  in effect (meaning this test can be used) for the duration of the COVID-19 declaration under Section 564(b)(1) of the Act, 21 U.S.C.section 360bbb-3(b)(1), unless the authorization is terminated  or revoked sooner.       Influenza A by PCR NEGATIVE NEGATIVE Final   Influenza B by PCR NEGATIVE NEGATIVE Final    Comment: (NOTE) The Xpert Xpress SARS-CoV-2/FLU/RSV plus assay is intended as an aid in the diagnosis of influenza from Nasopharyngeal swab specimens and should not be used as a sole basis for treatment. Nasal washings and aspirates are unacceptable for Xpert Xpress SARS-CoV-2/FLU/RSV testing.  Fact Sheet for Patients: EntrepreneurPulse.com.au  Fact Sheet for Healthcare Providers: IncredibleEmployment.be  This test is not yet approved or cleared by the [place] FDA and has been authorized for detection and/or diagnosis of SARS-CoV-2 by FDA under an Emergency Use Authorization (EUA). This EUA will remain in effect (meaning this  test can be used) for the duration of the COVID-19 declaration under Section 564(b)(1) of the Act, 21 U.S.C. section 360bbb-3(b)(1), unless the authorization is terminated or revoked.  Performed at [facility], [address] [place], [place] [address]          Radiology Studies: DG Chest 2 View  Result Date: 07/12/2021 CLINICAL DATA:  Weakness and dizziness EXAM: CHEST - 2 VIEW COMPARISON:  02/17/2018 FINDINGS: Heart size enlarged. Normal vascularity. Lungs are clear without infiltrate or effusion Posterior fusion cervical spine. Thoracic disc degeneration without acute skeletal abnormality. Surgical anchors in the left proximal humerus. IMPRESSION: Cardiac enlargement.  No acute cardiopulmonary abnormality Electronically  Signed   By: Franchot Gallo M.D.   On: 07/12/2021 11:40        Scheduled Meds:  [MAR Hold] amitriptyline  25 mg Oral QHS   [MAR Hold] diclofenac Sodium  2 g Topical QID   [MAR Hold] feeding supplement  237 mL Oral BID BM   [MAR Hold] furosemide  20 mg Oral Daily   [START ON 07/14/2021] multivitamin with minerals  1 tablet Oral Daily   [MAR Hold] simvastatin  20 mg Oral Daily   [MAR Hold] sodium chloride flush  3 mL Intravenous Q12H   [MAR Hold] tiotropium  18 mcg Inhalation Daily   [MAR Hold] vitamin B-12  1,000 mcg Oral Daily   Continuous Infusions:  [MAR Hold] sodium chloride 250 mL (07/13/21 0759)   [START ON 07/14/2021] iron sucrose       LOS: 0 days    Time spent: 27 minutes    Sharen Hones, MD [facility]   To contact the attending provider between 7A-7P or the covering provider during after hours 7P-7A, please log into the web site [email or web address] and access using universal [place] password for that web site. If you do not have the password, please call the hospital operator.  07/13/2021, 1:39 PM

## 2021-07-13 NOTE — TOC Initial Note (Signed)
Transition of Care [facility]) - Initial/Assessment Note    Patient Details  Name: Hailey Farrell MRN: [id number] Date of Birth: Mar 10, 1951  [facility] CM/SW Contact:    Pete Pelt, RN Phone Number: 07/13/2021, 9:46 AM  Clinical Narrative:   Patient lives at home with spouse.  She states that spouse assists her at home.  Spouse drives her to appointments and she is current with her PCP.  She has no concerns with medications at this time.  Patient states she would like a 3 n 1 to assist her and husband at home with her needs.                  Expected Discharge Plan: Home/Self Care     Patient Goals and CMS Choice        Expected Discharge Plan and Services Expected Discharge Plan: Home/Self Care                                              Prior Living Arrangements/Services     Patient language and need for interpreter reviewed:: Yes (no interpreter required) Do you feel safe going back to the place where you live?: Yes      Need for Family Participation in Patient Care: Yes (Comment) Care giver support system in place?: Yes (comment) Current home services:  (has walker and cane) Criminal Activity/Legal Involvement Pertinent to Current Situation/Hospitalization: No - Comment as needed  Activities of Daily Living Home Assistive Devices/Equipment: Eyeglasses, Cane (specify quad or straight), Walker (specify type) ADL Screening (condition at time of admission) Patient's cognitive ability adequate to safely complete daily activities?: Yes Is the patient deaf or have difficulty hearing?: No Does the patient have difficulty seeing, even when wearing glasses/contacts?: No Does the patient have difficulty concentrating, remembering, or making decisions?: No Patient able to express need for assistance with ADLs?: Yes Does the patient have difficulty dressing or bathing?: No Independently performs ADLs?: Yes (appropriate for developmental age) Does the  patient have difficulty walking or climbing stairs?: Yes Weakness of Legs: Both Weakness of Arms/Hands: Both  Permission Sought/Granted Permission sought to share information with : Case Manager Permission granted to share information with : Yes, Verbal Permission Granted              Emotional Assessment Appearance:: Appears stated age Attitude/Demeanor/Rapport: Gracious, Engaged Affect (typically observed): Pleasant, Appropriate Orientation: : Oriented to Self, Oriented to Place, Oriented to  Time, Oriented to Situation Alcohol / Substance Use: Not Applicable Psych Involvement: No (comment)  Admission diagnosis:  SOB (shortness of breath) [R06.02] Symptomatic anemia [D64.9] Patient Active Problem List   Diagnosis Date Noted   Angiodysplasia of upper gastrointestinal tract    Gastric erythema    Gastric nodule    AVM (arteriovenous malformation) of small bowel, acquired with hemorrhage    Symptomatic anemia 05/06/2020   History of total knee arthroplasty, left 02/14/2018   History of total knee arthroplasty 02/14/2018   Intestinal metaplasia of gastric mucosa    Columnar-lined esophagus    Hiatal hernia    H pylori ulcer    Iron deficiency anemia 08/19/2017   Acute post-hemorrhagic anemia 08/06/2017   Acute peptic ulcer of stomach    Common duodenal ulcer    Personal history of colonic polyps    Polyp of sigmoid colon    Internal hemorrhoids  Intestinal lump    GIB (gastrointestinal bleeding) 04/11/2017   Cervical spondylosis with myelopathy 11/24/2016   COPD, mild ([facility]) 11/24/2016   Tobacco use 11/24/2016   Full thickness rotator cuff tear 07/12/2016   Osteoarthritis of knee 07/12/2016   S/P rotator cuff repair 03/01/2016   Essential hypertension 07/22/2015   Intractable chronic migraine 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), [place] - [place] ([place]) [place] [address] Phone: [phone] Fax: [phone]  [facility] [place], [place] [address] Phone: [phone] Fax: [phone]     Social Determinants of Health (SDOH) Interventions    Readmission Risk Interventions No flowsheet data found.

## 2021-07-13 NOTE — Op Note (Signed)
[facility] Patient Name: Hailey Farrell Procedure Date: 07/13/2021 12:16 PM MRN: [id number] Account #: [id number] Date of Birth: [birth year] Admit Type: Inpatient Age: 71 Room: [facility] ENDO ROOM 4 Gender: Female Note Status: Finalized Instrument Name: Peds Colonoscope [id number] Procedure:             Small bowel enteroscopy Indications:           Iron deficiency anemia secondary to chronic blood                         loss, Arteriovenous malformation in the small intestine Providers:             Lin Landsman MD, MD Referring MD:          Baxter Kail. Rebeca Alert MD, MD (Referring MD) Medicines:             General Anesthesia Complications:         No immediate complications. Estimated blood loss: None. Procedure:             Pre-Anesthesia Assessment:                        - Prior to the procedure, a History and Physical was                         performed, and patient medications and allergies were                         reviewed. The patient is competent. The risks and                         benefits of the procedure and the sedation options and                         risks were discussed with the patient. All questions                         were answered and informed consent was obtained.                         Patient identification and proposed procedure were                         verified by the physician, the nurse, the                         anesthesiologist, the anesthetist and the technician                         in the pre-procedure area in the procedure room in the                         endoscopy suite. Mental Status Examination: alert and                         oriented. Airway Examination: normal oropharyngeal                         airway and neck mobility. Respiratory Examination:  clear to auscultation. CV Examination: normal.                         Prophylactic Antibiotics: The patient does not  require                         prophylactic antibiotics. Prior Anticoagulants: The                         patient has taken no previous anticoagulant or                         antiplatelet agents. ASA Grade Assessment: III - A                         patient with severe systemic disease. After reviewing                         the risks and benefits, the patient was deemed in                         satisfactory condition to undergo the procedure. The                         anesthesia plan was to use general anesthesia.                         Immediately prior to administration of medications,                         the patient was re-assessed for adequacy to receive                         sedatives. The heart rate, respiratory rate, oxygen                         saturations, blood pressure, adequacy of pulmonary                         ventilation, and response to care were monitored                         throughout the procedure. The physical status of the                         patient was re-assessed after the procedure.                        After obtaining informed consent, the endoscope was                         passed under direct vision. Throughout the procedure,                         the patient's blood pressure, pulse, and oxygen                         saturations were monitored continuously. The  Colonoscope was introduced through the mouth and                         advanced to the distal jejunum. The small bowel                         enteroscopy was accomplished without difficulty. The                         patient tolerated the procedure well. Findings:      There was no evidence of significant pathology in the proximal jejunum,       in the mid-jejunum and in the distal jejunum.      Two angioectasias with no bleeding were found in the second portion of       the duodenum. Coagulation for hemostasis using argon plasma was        successful. Estimated blood loss: none.      A single 5 mm angioectasia with no bleeding was found in the gastric       body. Coagulation for hemostasis using argon plasma was successful.      The gastroesophageal junction and examined esophagus were normal.      A medium-sized hiatal hernia was present. Impression:            - The examined portion of the jejunum was normal.                        - Two non-bleeding angioectasias in the duodenum.                         Treated with argon plasma coagulation (APC).                        - A single non-bleeding angioectasia in the stomach.                         Treated with argon plasma coagulation (APC).                        - Normal gastroesophageal junction and esophagus.                        - Medium-sized hiatal hernia.                        - No specimens collected. Recommendation:        - Return patient to hospital ward for ongoing care.                        - Advance diet as tolerated today.                        - Continue IV iron                        - Close follow up with hematology and GI Procedure Code(s):     --- Professional ---                        [phone], Small intestinal endoscopy, enteroscopy beyond  second portion of duodenum, not including ileum; with                         control of bleeding (eg, injection, bipolar cautery,                         unipolar cautery, laser, heater probe, stapler, plasma                         coagulator) Diagnosis Code(s):     --- Professional ---                        K31.819, Angiodysplasia of stomach and duodenum                         without bleeding                        K44.9, Diaphragmatic hernia without obstruction or                         gangrene                        D50.0, Iron deficiency anemia secondary to blood loss                         (chronic) CPT copyright 2019 American Medical Association. All rights reserved. The  codes documented in this report are preliminary and upon coder review may  be revised to meet current compliance requirements. Dr. Ulyess Mort Lin Landsman MD, MD 07/13/2021 12:50:52 PM This report has been signed electronically. Number of Addenda: 0 Note Initiated On: 07/13/2021 12:16 PM Estimated Blood Loss:  Estimated blood loss: none.      [facility]

## 2021-07-13 NOTE — Anesthesia Postprocedure Evaluation (Signed)
Anesthesia Post Note  Patient: Hailey Farrell  Procedure(s) Performed: ENTEROSCOPY  Patient location during evaluation: PACU Anesthesia Type: General Level of consciousness: awake and alert Pain management: pain level controlled Vital Signs Assessment: post-procedure vital signs reviewed and stable Respiratory status: spontaneous breathing, nonlabored ventilation, respiratory function stable and patient connected to nasal cannula oxygen Cardiovascular status: blood pressure returned to baseline and stable Postop Assessment: no apparent nausea or vomiting Anesthetic complications: no   No notable events documented.   Last Vitals:  Vitals:   07/13/21 1320 07/13/21 1331  BP: (!) 141/75 136/82  Pulse: 81 82  Resp: (!) 22 16  Temp:    SpO2: 96% 92%    Last Pain:  Vitals:   07/13/21 1331  TempSrc:   PainSc: Asleep                 Precious Haws Rolf Fells

## 2021-07-13 NOTE — Progress Notes (Signed)
Initial Nutrition Assessment  DOCUMENTATION CODES:   Obesity unspecified  INTERVENTION:   -Once diet is advanced, resume:  -Ensure Enlive po BID, each supplement provides 350 kcal and 20 grams of protein.  -MVI with minerals daily  NUTRITION DIAGNOSIS:   Inadequate oral intake related to altered GI function as evidenced by NPO status.  GOAL:   Patient will meet greater than or equal to 90% of their needs  MONITOR:   PO intake, Supplement acceptance, Diet advancement, Labs, Weight trends, Skin, I & O's  REASON FOR ASSESSMENT:   Malnutrition Screening Tool    ASSESSMENT:   Hailey Farrell is a 71 y.o. female with medical history significant iron deficiency anemia on iron infusion weekly, COPD, migraine headaches who presents to the ER for evaluation of a 2-week history of weakness, fatigue and feeling like she is going to pass out especially with standing.  Pt admitted with symptomatic iron deficiency anemia.   Reviewed I/O's: +1.1 L x 24 hours   Pt unavailable at time of visit. Pt in endo suite at time of visit. RD unable to obtain further nutrition-related history or complete nutrition-focused physical exam at this time.    Per GI notes, pt for push enteroscopy today. Pt currently NPO for procedure.    Pt previously on a full liquid diet and consumed 100% of dinner yesterday.   Reviewed wt hx; pt has experienced a 4.4% wt loss over the past 4 months, which is not significant for time frame.   Pt would beenfit from addition of oral nutrition supplements.   Medications reviewed and include lasix and vitamin B-12.   Labs reviewed.   Diet Order:   Diet Order             Diet full liquid Room service appropriate? Yes; Fluid consistency: Thin  Diet effective now                   EDUCATION NEEDS:   No education needs have been identified at this time  Skin:  Skin Assessment: Reviewed RN Assessment  Last BM:  07/10/21  Height:   Ht Readings from Last 1  Encounters:  07/12/21 5\' 4"  (1.626 m)    Weight:   Wt Readings from Last 1 Encounters:  07/12/21 83.5 kg    Ideal Body Weight:  54.5 kg  BMI:  Body mass index is 31.58 kg/m.  Estimated Nutritional Needs:   Kcal:  1700-1900  Protein:  100-115 grams  Fluid:  > 1.7 L    Loistine Chance, RD, LDN, [place] Registered Dietitian II Certified Diabetes Care and Education Specialist Please refer to [facility] for RD and/or RD on-call/weekend/after hours pager

## 2021-07-14 ENCOUNTER — Encounter: Payer: Self-pay | Admitting: Gastroenterology

## 2021-07-14 ENCOUNTER — Telehealth: Payer: Self-pay

## 2021-07-14 DIAGNOSIS — D649 Anemia, unspecified: Secondary | ICD-10-CM | POA: Diagnosis not present

## 2021-07-14 DIAGNOSIS — J449 Chronic obstructive pulmonary disease, unspecified: Secondary | ICD-10-CM | POA: Diagnosis not present

## 2021-07-14 DIAGNOSIS — K31819 Angiodysplasia of stomach and duodenum without bleeding: Secondary | ICD-10-CM | POA: Diagnosis not present

## 2021-07-14 DIAGNOSIS — K5521 Angiodysplasia of colon with hemorrhage: Secondary | ICD-10-CM | POA: Diagnosis not present

## 2021-07-14 LAB — CBC WITH DIFFERENTIAL/PLATELET
Abs Immature Granulocytes: 0.02 10*3/uL (ref 0.00–0.07)
Basophils Absolute: 0 10*3/uL (ref 0.0–0.1)
Basophils Relative: 1 %
Eosinophils Absolute: 0 10*3/uL (ref 0.0–0.5)
Eosinophils Relative: 1 %
HCT: 25.4 % — ABNORMAL LOW (ref 36.0–46.0)
Hemoglobin: 7.8 g/dL — ABNORMAL LOW (ref 12.0–15.0)
Immature Granulocytes: 0 %
Lymphocytes Relative: 21 %
Lymphs Abs: 1.2 10*3/uL (ref 0.7–4.0)
MCH: 25.2 pg — ABNORMAL LOW (ref 26.0–34.0)
MCHC: 30.7 g/dL (ref 30.0–36.0)
MCV: 82.2 fL (ref 80.0–100.0)
Monocytes Absolute: 0.4 10*3/uL (ref 0.1–1.0)
Monocytes Relative: 8 %
Neutro Abs: 4 10*3/uL (ref 1.7–7.7)
Neutrophils Relative %: 69 %
Platelets: 336 10*3/uL (ref 150–400)
RBC: 3.09 MIL/uL — ABNORMAL LOW (ref 3.87–5.11)
RDW: 19 % — ABNORMAL HIGH (ref 11.5–15.5)
WBC: 5.6 10*3/uL (ref 4.0–10.5)
nRBC: 0.9 % — ABNORMAL HIGH (ref 0.0–0.2)

## 2021-07-14 MED ORDER — TIOTROPIUM BROMIDE MONOHYDRATE 18 MCG IN CAPS: 18.0000 ug | ORAL_CAPSULE | Freq: Every day | RESPIRATORY_TRACT | 0 refills | Status: DC

## 2021-07-14 NOTE — Telephone Encounter (Signed)
-----   Message from Lin Landsman, MD sent at 07/14/2021 11:07 AM EST ----- Regarding: follow up Please make a follow up to see me in 2weeks, ok to overbook  [place] [facility]

## 2021-07-14 NOTE — Discharge Summary (Signed)
Physician Discharge Summary   Patient: Hailey Farrell MRN: [id number] DOB: 21-Oct-1950  Admit date:     07/12/2021  Discharge date: 07/14/21  Discharge Physician: Sharen Hones   PCP: Letta Median, MD    Follow-up with PCP in 1 week. Follow-up with Dr. Marius Ditch in 2 weeks. Follow-up with Dr. Tasia Catchings in 1 month for IV iron  Discharge Diagnoses: Principal Problem:   Symptomatic anemia Active Problems:   AVM (arteriovenous malformation) of small bowel, acquired with hemorrhage   COPD, mild (HCC)   Essential hypertension  Resolved Problems:   * No resolved hospital problems. *   Hospital Course: Hailey Farrell is a 71 y.o. female with medical history significant iron deficiency anemia on iron infusion weekly, COPD, migraine headaches who presents to the ER for evaluation of a 2-week history of weakness, fatigue and feeling like she is going to pass out especially with standing.. Patient had hemoglobin 5.8, received hemoglobin transfusion.  EGD was performed 2/8, treated for AVM.  Assessment and Plan:  Acute on chronic blood loss anemia. Iron deficient anemia. Patient received 2 units of PRBC, she still has significant iron deficiency, she received IV iron yesterday, will receive another dose before discharge today. B12 level adequate. EGD was performed, treated for duodenal AVM, no active bleeding. Patient hemoglobin today 7.8, no evidence of GI bleed at this time.  She will be discharged in a stable condition to follow-up with PCP, GI and hematology.  COPD  Stable  Essential hypertension Stable.  Obesity with a BMI of 31.58.      Consultants: GI Procedures performed: EGD  Disposition: Home Diet recommendation:  Discharge Diet Orders (From admission, onward)     Start     Ordered   07/14/21 0000  Diet - low sodium heart healthy        07/14/21 0918           Cardiac diet  DISCHARGE MEDICATION: Allergies as of 07/14/2021       Reactions   Latex Rash   Welts  over body        Medication List     TAKE these medications    acetaminophen 650 MG CR tablet Commonly known as: TYLENOL Take 650 mg by mouth every 8 (eight) hours as needed for pain.   albuterol 108 (90 Base) MCG/ACT inhaler Commonly known as: VENTOLIN HFA Inhale 2 puffs into the lungs every 6 (six) hours as needed for wheezing.   amitriptyline 25 MG tablet Commonly known as: ELAVIL Take 25 mg by mouth at bedtime.   budesonide-formoterol 160-4.5 MCG/ACT inhaler Commonly known as: SYMBICORT Inhale 2 puffs into the lungs 2 (two) times daily.   diphenhydramine-acetaminophen 25-500 MG Tabs tablet Commonly known as: TYLENOL PM Take 1-2 tablets by mouth at bedtime as needed (pain).   furosemide 20 MG tablet Commonly known as: LASIX Take 20 mg daily by mouth.   lisinopril 20 MG tablet Commonly known as: ZESTRIL Take 20 mg by mouth daily.   potassium chloride 10 MEQ CR capsule Commonly known as: MICRO-K Take 10 mEq by mouth daily.   rOPINIRole 2 MG tablet Commonly known as: REQUIP Take 2-4 mg by mouth at bedtime as needed (restless leg syndrome).   simvastatin 20 MG tablet Commonly known as: ZOCOR Take 20 mg by mouth daily.   tiotropium 18 MCG inhalation capsule Commonly known as: SPIRIVA Place 1 capsule (18 mcg total) into inhaler and inhale daily.   vitamin B-12 1000 MCG tablet Commonly known  as: CYANOCOBALAMIN Take 1,000 mcg by mouth daily.               Durable Medical Equipment  (From admission, onward)           Start     Ordered   07/13/21 0942  For home use only DME 3 n 1  Once        07/13/21 0941            Follow-up Information     Bender, Durene Cal, MD Follow up in 1 week(s).   Specialty: Family Medicine Contact information: [place] [address] [phone]         Lin Landsman, MD Follow up in 2 week(s).   Specialty: Gastroenterology Contact information: [place] [address] [phone]         Earlie Server, MD Follow up in 1 month(s).   Specialty: Oncology Contact information: [place] [address] [phone]                 Discharge Exam: Danley Danker Weights   07/12/21 1042  Weight: 83.5 kg   General: Appear in No distress; no visible Abnormal Neck Mass Or lumps, Conjunctiva normal Cardiovascular: S1 and S2 Present, No Murmur, Respiratory: good respiratory effort, Bilateral Air entry present and CTA, no Crackles, no wheezes Abdomen: Bowel Sound present Extremities: no Pedal edema Neurology: alert and oriented to time, place, and person Gait not checked due to patient safety concerns   Condition at discharge: good  The results of significant diagnostics from this hospitalization (including imaging, microbiology, ancillary and laboratory) are listed below for reference.   Imaging Studies: DG Chest 2 View  Result Date: 07/12/2021 CLINICAL DATA:  Weakness and dizziness EXAM: CHEST - 2 VIEW COMPARISON:  02/17/2018 FINDINGS: Heart size enlarged. Normal vascularity. Lungs are clear without infiltrate or effusion Posterior fusion cervical spine. Thoracic disc degeneration without acute skeletal abnormality. Surgical anchors in the left proximal humerus. IMPRESSION: Cardiac enlargement.  No acute cardiopulmonary abnormality Electronically Signed   By: Franchot Gallo M.D.   On: 07/12/2021 11:40    Microbiology: Results for orders placed or performed during the hospital encounter of 07/12/21  Resp Panel by RT-PCR (Flu A&B, Covid) Nasopharyngeal Swab     Status: None   Collection Time: 07/12/21 12:19 PM   Specimen: Nasopharyngeal Swab; Nasopharyngeal(NP) swabs in vial transport medium  Result Value Ref Range Status   SARS Coronavirus 2 by RT PCR NEGATIVE NEGATIVE Final    Comment: (NOTE) SARS-CoV-2 target nucleic acids are NOT DETECTED.  The SARS-CoV-2 RNA is generally detectable in upper  respiratory specimens during the acute phase of infection. The lowest concentration of SARS-CoV-2 viral copies this assay can detect is 138 copies/mL. A negative result does not preclude SARS-Cov-2 infection and should not be used as the sole basis for treatment or other patient management decisions. A negative result may occur with  improper specimen collection/handling, submission of specimen other than nasopharyngeal swab, presence of viral mutation(s) within the areas targeted by this assay, and inadequate number of viral copies(<138 copies/mL). A negative result must be combined with clinical observations, patient history, and epidemiological information. The expected result is Negative.  Fact Sheet for Patients:  EntrepreneurPulse.com.au  Fact Sheet for Healthcare Providers:  IncredibleEmployment.be  This test is no t yet approved or cleared by the [place] FDA and  has been authorized for detection and/or diagnosis of SARS-CoV-2 by FDA  under an Emergency Use Authorization (EUA). This EUA will remain  in effect (meaning this test can be used) for the duration of the COVID-19 declaration under Section 564(b)(1) of the Act, 21 U.S.C.section 360bbb-3(b)(1), unless the authorization is terminated  or revoked sooner.       Influenza A by PCR NEGATIVE NEGATIVE Final   Influenza B by PCR NEGATIVE NEGATIVE Final    Comment: (NOTE) The Xpert Xpress SARS-CoV-2/FLU/RSV plus assay is intended as an aid in the diagnosis of influenza from Nasopharyngeal swab specimens and should not be used as a sole basis for treatment. Nasal washings and aspirates are unacceptable for Xpert Xpress SARS-CoV-2/FLU/RSV testing.  Fact Sheet for Patients: EntrepreneurPulse.com.au  Fact Sheet for Healthcare Providers: IncredibleEmployment.be  This test is not yet approved or cleared by the [place] FDA and has been  authorized for detection and/or diagnosis of SARS-CoV-2 by FDA under an Emergency Use Authorization (EUA). This EUA will remain in effect (meaning this test can be used) for the duration of the COVID-19 declaration under Section 564(b)(1) of the Act, 21 U.S.C. section 360bbb-3(b)(1), unless the authorization is terminated or revoked.  Performed at [facility], [address] [place], [place] [address]     Labs: CBC: Recent Labs  Lab 07/12/21 1043 07/13/21 0416 07/14/21 0618  WBC 5.4 6.1 5.6  NEUTROABS  --   --  4.0  HGB 5.8* 7.5* 7.8*  HCT 20.0* 24.1* 25.4*  MCV 78.4* 80.9 82.2  PLT 431* 374 458   Basic Metabolic Panel: Recent Labs  Lab 07/12/21 1043 07/13/21 0416  NA 140 137  K 3.7 3.8  CL 109 105  CO2 25 27  GLUCOSE 101* 90  BUN 23 18  CREATININE 1.09* 0.93  CALCIUM 8.8* 8.5*   Liver Function Tests: Recent Labs  Lab 07/12/21 1043  AST 16  ALT 12  ALKPHOS 32*  BILITOT 0.3  PROT 6.1*  ALBUMIN 3.7   CBG: No results for input(s): GLUCAP in the last 168 hours.  Discharge time spent: less than 30 minutes.  Signed: Sharen Hones, MD [facility] 07/14/2021

## 2021-07-14 NOTE — Telephone Encounter (Signed)
Made follow up appointment with Dr. Marius Ditch on 07/27/21 at 1:00

## 2021-07-25 ENCOUNTER — Telehealth: Payer: Self-pay | Admitting: Gastroenterology

## 2021-07-25 NOTE — Telephone Encounter (Signed)
Tried to call patient but mailbox is full  

## 2021-07-25 NOTE — Telephone Encounter (Signed)
Incoming call from patient requesting appt   Hailey Farrell scheduled appt for 07/27/21 at 1:00pm  Called patient back, no answer, VM full

## 2021-07-25 NOTE — Progress Notes (Signed)
[place]  [phone] Fax:(336) [phone]  ID: Hailey Farrell OB: [birth year]  MR#: [id number]  QVZ#:[id number]  Patient Care Team: Letta Median, MD as PCP - General (Family Medicine)  CHIEF COMPLAINT: Iron deficiency anemia  INTERVAL HISTORY: Patient returns to clinic today for further evaluation and hospital follow-up.  She recently was admitted to the hospital requiring 2 units of packed red blood cells and 2 doses of IV iron.  She feels significantly improved and back to her baseline.  She does not complain of any further weakness or fatigue.  She has no neurologic complaints.  She denies any recent fevers or illnesses.  She has a good appetite and denies weight loss.    She denies any chest pain, shortness of breath, cough, or hemoptysis.  She denies any nausea, vomiting, constipation, or diarrhea. She has no further melena or hematochezia.  She has no urinary complaints.  Patient offers no specific complaints today.  REVIEW OF SYSTEMS:   Review of Systems  Constitutional: Negative.  Negative for fever, malaise/fatigue and weight loss.  Respiratory: Negative.  Negative for cough and shortness of breath.   Cardiovascular: Negative.  Negative for chest pain and leg swelling.  Gastrointestinal: Negative.  Negative for abdominal pain, blood in stool and melena.  Genitourinary: Negative.  Negative for hematuria.  Musculoskeletal: Negative.  Negative for back pain.  Skin: Negative.  Negative for rash.  Neurological: Negative.  Negative for dizziness, focal weakness, weakness and headaches.  Psychiatric/Behavioral: Negative.  The patient is not nervous/anxious.    As per HPI. Otherwise, a complete review of systems is negative.  PAST MEDICAL HISTORY: Past Medical History:  Diagnosis Date   Anemia    Arthritis    Breast cancer ([place]) 1990   Right   COPD (chronic obstructive pulmonary disease) ([place])    Dyspnea    DOE   Elevated lipids     Hypertension    Lower extremity edema    Migraines    Migraines    Personal history of radiation therapy 1990   Breast   Restless leg syndrome    Rotator cuff injury     PAST SURGICAL HISTORY: Past Surgical History:  Procedure Laterality Date   ABDOMINAL HYSTERECTOMY     BACK SURGERY  08/2017   CERVICAL FUSION   BREAST BIOPSY Right 1990   LUMPECTOMY.  took lymph nodes   BREAST LUMPECTOMY     COLONOSCOPY WITH PROPOFOL N/A 07/20/2017   Procedure: COLONOSCOPY WITH PROPOFOL;  Surgeon: Virgel Manifold, MD;  Location: [facility] ENDOSCOPY;  Service: Endoscopy;  Laterality: N/A;   ENTEROSCOPY N/A 08/08/2017   Procedure: ENTEROSCOPY;  Surgeon: Lin Landsman, MD;  Location: [facility] ENDOSCOPY;  Service: Gastroenterology;  Laterality: N/A;   ENTEROSCOPY N/A 05/07/2020   Procedure: ENTEROSCOPY with Adult colonoscope;  Surgeon: Virgel Manifold, MD;  Location: [facility] ENDOSCOPY;  Service: Endoscopy;  Laterality: N/A;   ENTEROSCOPY N/A 03/17/2021   Procedure: ENTEROSCOPY;  Surgeon: Virgel Manifold, MD;  Location: [facility] ENDOSCOPY;  Service: Endoscopy;  Laterality: N/A;  PUSH   ENTEROSCOPY N/A 07/13/2021   Procedure: ENTEROSCOPY;  Surgeon: Lin Landsman, MD;  Location: [facility] ENDOSCOPY;  Service: Gastroenterology;  Laterality: N/A;   ESOPHAGOGASTRODUODENOSCOPY Left 04/13/2017   Procedure: ESOPHAGOGASTRODUODENOSCOPY (EGD);  Surgeon: Virgel Manifold, MD;  Location: [facility] ENDOSCOPY;  Service: Endoscopy;  Laterality: Left;   ESOPHAGOGASTRODUODENOSCOPY (EGD) WITH PROPOFOL N/A 07/20/2017   Procedure: ESOPHAGOGASTRODUODENOSCOPY (EGD) WITH PROPOFOL;  Surgeon: Virgel Manifold, MD;  Location: [facility]  ENDOSCOPY;  Service: Endoscopy;  Laterality: N/A;   ESOPHAGOGASTRODUODENOSCOPY (EGD) WITH PROPOFOL N/A 12/12/2017   Procedure: ESOPHAGOGASTRODUODENOSCOPY (EGD) WITH PROPOFOL;  Surgeon: Virgel Manifold, MD;  Location: [facility] ENDOSCOPY;  Service: Endoscopy;  Laterality: N/A;    ESOPHAGOGASTRODUODENOSCOPY (EGD) WITH PROPOFOL N/A 12/23/2020   Procedure: ESOPHAGOGASTRODUODENOSCOPY (EGD) WITH PROPOFOL;  Surgeon: Virgel Manifold, MD;  Location: [facility] ENDOSCOPY;  Service: Endoscopy;  Laterality: N/A;   GIVENS CAPSULE STUDY N/A 03/10/2021   Procedure: GIVENS CAPSULE STUDY;  Surgeon: Virgel Manifold, MD;  Location: [facility] ENDOSCOPY;  Service: Endoscopy;  Laterality: N/A;   MEDIAL PARTIAL KNEE REPLACEMENT Left 1990   torn ligament. no knee replacement at that time   PARTIAL HYSTERECTOMY     SHOULDER ARTHROSCOPY WITH OPEN ROTATOR CUFF REPAIR Left 03/01/2016   Procedure: SHOULDER ARTHROSCOPY WITH OPEN ROTATOR CUFF REPAIR;  Surgeon: Thornton Park, MD;  Location: [facility] ORS;  Service: Orthopedics;  Laterality: Left;   TOTAL KNEE ARTHROPLASTY Left 02/14/2018   Procedure: TOTAL KNEE ARTHROPLASTY;  Surgeon: Thornton Park, MD;  Location: [facility] ORS;  Service: Orthopedics;  Laterality: Left;    FAMILY HISTORY: Family History  Problem Relation Age of Onset   CAD Mother    CAD Sister    Breast cancer Neg Hx     ADVANCED DIRECTIVES (Y/N):  N  HEALTH MAINTENANCE: Social History   Tobacco Use   Smoking status: Some Days    Packs/day: 0.25    Years: 50.00    Pack years: 12.50    Types: Cigarettes    Last attempt to quit: 03/13/2016    Years since quitting: 5.3   Smokeless tobacco: Never  Vaping Use   Vaping Use: Never used  Substance Use Topics   Alcohol use: No   Drug use: No     Colonoscopy:  PAP:  Bone density:  Lipid panel:  Allergies  Allergen Reactions   Latex Rash    Welts over body    Current Outpatient Medications  Medication Sig Dispense Refill   acetaminophen (TYLENOL) 650 MG CR tablet Take 650 mg by mouth every 8 (eight) hours as needed for pain.     albuterol (VENTOLIN HFA) 108 (90 Base) MCG/ACT inhaler Inhale 2 puffs into the lungs every 6 (six) hours as needed for wheezing.     amitriptyline (ELAVIL) 25 MG tablet Take 25 mg by mouth at  bedtime.     budesonide-formoterol (SYMBICORT) 160-4.5 MCG/ACT inhaler Inhale 2 puffs into the lungs 2 (two) times daily.     diphenhydramine-acetaminophen (TYLENOL PM) 25-500 MG TABS tablet Take 1-2 tablets by mouth at bedtime as needed (pain).     furosemide (LASIX) 20 MG tablet Take 20 mg daily by mouth.      lisinopril (PRINIVIL,ZESTRIL) 20 MG tablet Take 20 mg by mouth daily.     potassium chloride (MICRO-K) 10 MEQ CR capsule Take 10 mEq by mouth daily.     rOPINIRole (REQUIP) 2 MG tablet Take 2-4 mg by mouth at bedtime as needed (restless leg syndrome).     simvastatin (ZOCOR) 20 MG tablet Take 20 mg by mouth daily.      tiotropium (SPIRIVA) 18 MCG inhalation capsule Place 1 capsule (18 mcg total) into inhaler and inhale daily. 30 capsule 0   vitamin B-12 (CYANOCOBALAMIN) 1000 MCG tablet Take 1,000 mcg by mouth daily.     No current facility-administered medications for this visit.   Facility-Administered Medications Ordered in Other Visits  Medication Dose Route Frequency Provider Last Rate Last Admin  0.9 %  sodium chloride infusion   Intravenous Continuous Sindy Guadeloupe, MD 0 mL/hr at 05/23/21 1452 New Bag at 07/13/21 1223    OBJECTIVE: Vitals:   07/28/21 1320  BP: 119/75  Pulse: 81  Resp: 18  Temp: 98.2 F (36.8 C)  SpO2: 99%     Body mass index is 31.79 kg/m.    ECOG FS:0 - Asymptomatic  General: Well-developed, well-nourished, no acute distress. Eyes: Pink conjunctiva, anicteric sclera. HEENT: Normocephalic, moist mucous membranes. Lungs: No audible wheezing or coughing. Heart: Regular rate and rhythm. Abdomen: Soft, nontender, no obvious distention. Musculoskeletal: No edema, cyanosis, or clubbing. Neuro: Alert, answering all questions appropriately. Cranial nerves grossly intact. Skin: No rashes or petechiae noted. Psych: Normal affect.  LAB RESULTS:  Lab Results  Component Value Date   NA 137 07/13/2021   K 3.8 07/13/2021   CL 105 07/13/2021   CO2 27  07/13/2021   GLUCOSE 90 07/13/2021   BUN 18 07/13/2021   CREATININE 0.93 07/13/2021   CALCIUM 8.5 (L) 07/13/2021   PROT 6.1 (L) 07/12/2021   ALBUMIN 3.7 07/12/2021   AST 16 07/12/2021   ALT 12 07/12/2021   ALKPHOS 32 (L) 07/12/2021   BILITOT 0.3 07/12/2021   GFRNONAA >60 07/13/2021   GFRAA >60 02/16/2018    Lab Results  Component Value Date   WBC 5.7 07/28/2021   NEUTROABS 3.8 07/28/2021   HGB 9.8 (L) 07/28/2021   HCT 32.0 (L) 07/28/2021   MCV 85.6 07/28/2021   PLT 449 (H) 07/28/2021   Lab Results  Component Value Date   IRON 27 (L) 07/12/2021   TIBC 431 07/12/2021   IRONPCTSAT 6 (L) 07/12/2021   Lab Results  Component Value Date   FERRITIN 11 07/12/2021     STUDIES: DG Chest 2 View  Result Date: 07/12/2021 CLINICAL DATA:  Weakness and dizziness EXAM: CHEST - 2 VIEW COMPARISON:  02/17/2018 FINDINGS: Heart size enlarged. Normal vascularity. Lungs are clear without infiltrate or effusion Posterior fusion cervical spine. Thoracic disc degeneration without acute skeletal abnormality. Surgical anchors in the left proximal humerus. IMPRESSION: Cardiac enlargement.  No acute cardiopulmonary abnormality Electronically Signed   By: Franchot Gallo M.D.   On: 07/12/2021 11:40     ASSESSMENT: Iron deficiency anemia.  PLAN:    1. 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  Argon plasma was used for hemostasis.  Patient's hemoglobin is improved to 9.8.  Repeat iron stores are pending at time of dictation.  Recommended patient receive additional IV Venofer today, but she has declined.  She last received treatment on July 14, 2021.  No intervention is needed at this time.  Return to clinic in 2 months with repeat laboratory work, further evaluation, and continuation of treatment if needed.    I spent a total of 20 minutes reviewing chart data, face-to-face evaluation with the patient,  counseling and coordination of care as detailed above.   Patient expressed understanding and was in agreement with this plan. She also understands that She can call clinic at any time with any questions, concerns, or complaints.    Lloyd Huger, MD   07/28/2021 2:40 PM

## 2021-07-26 ENCOUNTER — Encounter: Payer: Self-pay | Admitting: Gastroenterology

## 2021-07-26 ENCOUNTER — Ambulatory Visit: Payer: Medicare Other | Admitting: Gastroenterology

## 2021-07-28 ENCOUNTER — Inpatient Hospital Stay: Payer: Medicare Other | Attending: Oncology | Admitting: Oncology

## 2021-07-28 ENCOUNTER — Inpatient Hospital Stay: Payer: Medicare Other

## 2021-07-28 ENCOUNTER — Other Ambulatory Visit: Payer: Self-pay

## 2021-07-28 VITALS — BP 119/75 | HR 81 | Temp 98.2°F | Resp 18 | Ht 64.0 in | Wt 185.2 lb

## 2021-07-28 DIAGNOSIS — D509 Iron deficiency anemia, unspecified: Secondary | ICD-10-CM | POA: Insufficient documentation

## 2021-07-28 DIAGNOSIS — Z853 Personal history of malignant neoplasm of breast: Secondary | ICD-10-CM | POA: Diagnosis not present

## 2021-07-28 DIAGNOSIS — R42 Dizziness and giddiness: Secondary | ICD-10-CM | POA: Diagnosis not present

## 2021-07-28 DIAGNOSIS — Z923 Personal history of irradiation: Secondary | ICD-10-CM | POA: Diagnosis not present

## 2021-07-28 DIAGNOSIS — Z79899 Other long term (current) drug therapy: Secondary | ICD-10-CM | POA: Diagnosis not present

## 2021-07-28 DIAGNOSIS — R531 Weakness: Secondary | ICD-10-CM | POA: Insufficient documentation

## 2021-07-28 DIAGNOSIS — Z8249 Family history of ischemic heart disease and other diseases of the circulatory system: Secondary | ICD-10-CM | POA: Insufficient documentation

## 2021-07-28 DIAGNOSIS — G2581 Restless legs syndrome: Secondary | ICD-10-CM | POA: Diagnosis not present

## 2021-07-28 DIAGNOSIS — F17210 Nicotine dependence, cigarettes, uncomplicated: Secondary | ICD-10-CM | POA: Insufficient documentation

## 2021-07-28 LAB — CBC WITH DIFFERENTIAL/PLATELET
Abs Immature Granulocytes: 0.01 10*3/uL (ref 0.00–0.07)
Basophils Absolute: 0.1 10*3/uL (ref 0.0–0.1)
Basophils Relative: 1 %
Eosinophils Absolute: 0.1 10*3/uL (ref 0.0–0.5)
Eosinophils Relative: 1 %
HCT: 32 % — ABNORMAL LOW (ref 36.0–46.0)
Hemoglobin: 9.8 g/dL — ABNORMAL LOW (ref 12.0–15.0)
Immature Granulocytes: 0 %
Lymphocytes Relative: 24 %
Lymphs Abs: 1.4 10*3/uL (ref 0.7–4.0)
MCH: 26.2 pg (ref 26.0–34.0)
MCHC: 30.6 g/dL (ref 30.0–36.0)
MCV: 85.6 fL (ref 80.0–100.0)
Monocytes Absolute: 0.4 10*3/uL (ref 0.1–1.0)
Monocytes Relative: 7 %
Neutro Abs: 3.8 10*3/uL (ref 1.7–7.7)
Neutrophils Relative %: 67 %
Platelets: 449 10*3/uL — ABNORMAL HIGH (ref 150–400)
RBC: 3.74 MIL/uL — ABNORMAL LOW (ref 3.87–5.11)
RDW: 22.4 % — ABNORMAL HIGH (ref 11.5–15.5)
WBC: 5.7 10*3/uL (ref 4.0–10.5)
nRBC: 0 % (ref 0.0–0.2)

## 2021-07-28 LAB — FERRITIN: Ferritin: 68 ng/mL (ref 11–307)

## 2021-07-28 LAB — SAMPLE TO BLOOD BANK

## 2021-07-28 LAB — IRON AND TIBC
Iron: 44 ug/dL (ref 28–170)
Saturation Ratios: 11 % (ref 10.4–31.8)
TIBC: 385 ug/dL (ref 250–450)
UIBC: 341 ug/dL

## 2021-10-04 ENCOUNTER — Inpatient Hospital Stay: Payer: Medicare Other

## 2021-10-04 ENCOUNTER — Inpatient Hospital Stay: Payer: Medicare Other | Attending: Nurse Practitioner

## 2021-10-04 ENCOUNTER — Other Ambulatory Visit: Payer: Self-pay

## 2021-10-04 ENCOUNTER — Encounter: Payer: Self-pay | Admitting: Nurse Practitioner

## 2021-10-04 ENCOUNTER — Inpatient Hospital Stay (HOSPITAL_BASED_OUTPATIENT_CLINIC_OR_DEPARTMENT_OTHER): Payer: Medicare Other | Admitting: Nurse Practitioner

## 2021-10-04 VITALS — BP 113/68 | HR 94 | Temp 96.4°F | Resp 16 | Ht 64.0 in | Wt 182.0 lb

## 2021-10-04 DIAGNOSIS — B9681 Helicobacter pylori [H. pylori] as the cause of diseases classified elsewhere: Secondary | ICD-10-CM | POA: Diagnosis not present

## 2021-10-04 DIAGNOSIS — D649 Anemia, unspecified: Secondary | ICD-10-CM

## 2021-10-04 DIAGNOSIS — Z853 Personal history of malignant neoplasm of breast: Secondary | ICD-10-CM | POA: Diagnosis not present

## 2021-10-04 DIAGNOSIS — D500 Iron deficiency anemia secondary to blood loss (chronic): Secondary | ICD-10-CM | POA: Diagnosis not present

## 2021-10-04 DIAGNOSIS — K279 Peptic ulcer, site unspecified, unspecified as acute or chronic, without hemorrhage or perforation: Secondary | ICD-10-CM | POA: Insufficient documentation

## 2021-10-04 DIAGNOSIS — R197 Diarrhea, unspecified: Secondary | ICD-10-CM | POA: Insufficient documentation

## 2021-10-04 DIAGNOSIS — D509 Iron deficiency anemia, unspecified: Secondary | ICD-10-CM | POA: Insufficient documentation

## 2021-10-04 DIAGNOSIS — Z923 Personal history of irradiation: Secondary | ICD-10-CM | POA: Insufficient documentation

## 2021-10-04 LAB — CBC WITH DIFFERENTIAL/PLATELET
Abs Immature Granulocytes: 0.01 10*3/uL (ref 0.00–0.07)
Basophils Absolute: 0 10*3/uL (ref 0.0–0.1)
Basophils Relative: 1 %
Eosinophils Absolute: 0.1 10*3/uL (ref 0.0–0.5)
Eosinophils Relative: 1 %
HCT: 21.3 % — ABNORMAL LOW (ref 36.0–46.0)
Hemoglobin: 6.5 g/dL — ABNORMAL LOW (ref 12.0–15.0)
Immature Granulocytes: 0 %
Lymphocytes Relative: 26 %
Lymphs Abs: 1.3 10*3/uL (ref 0.7–4.0)
MCH: 23.3 pg — ABNORMAL LOW (ref 26.0–34.0)
MCHC: 30.5 g/dL (ref 30.0–36.0)
MCV: 76.3 fL — ABNORMAL LOW (ref 80.0–100.0)
Monocytes Absolute: 0.5 10*3/uL (ref 0.1–1.0)
Monocytes Relative: 9 %
Neutro Abs: 3.1 10*3/uL (ref 1.7–7.7)
Neutrophils Relative %: 63 %
Platelets: 369 10*3/uL (ref 150–400)
RBC: 2.79 MIL/uL — ABNORMAL LOW (ref 3.87–5.11)
RDW: 17.9 % — ABNORMAL HIGH (ref 11.5–15.5)
WBC: 4.9 10*3/uL (ref 4.0–10.5)
nRBC: 0.4 % — ABNORMAL HIGH (ref 0.0–0.2)

## 2021-10-04 LAB — FERRITIN: Ferritin: 8 ng/mL — ABNORMAL LOW (ref 11–307)

## 2021-10-04 LAB — IRON AND TIBC
Iron: 19 ug/dL — ABNORMAL LOW (ref 28–170)
Saturation Ratios: 4 % — ABNORMAL LOW (ref 10.4–31.8)
TIBC: 461 ug/dL — ABNORMAL HIGH (ref 250–450)
UIBC: 442 ug/dL

## 2021-10-04 LAB — SAMPLE TO BLOOD BANK

## 2021-10-04 LAB — PREPARE RBC (CROSSMATCH)

## 2021-10-04 MED ORDER — SODIUM CHLORIDE 0.9 % IV SOLN: 200.0000 mg | Freq: Once | INTRAVENOUS | Status: DC

## 2021-10-04 MED ORDER — IRON SUCROSE 20 MG/ML IV SOLN
200.0000 mg | Freq: Once | INTRAVENOUS | Status: AC
  Administered 2021-10-04: 200 mg via INTRAVENOUS
  Filled 2021-10-04: qty 10

## 2021-10-04 MED ORDER — SODIUM CHLORIDE 0.9 % IV SOLN
INTRAVENOUS | Status: DC
  Filled 2021-10-04 (×2): qty 250

## 2021-10-04 NOTE — Progress Notes (Signed)
?[place]  ?[phone] [phone] ? ?ID: Hailey Farrell OB: [birth year]  MR#: [id number]  YBO#:[id number] ? ?Patient Care Team: ?Letta Median, MD as PCP - General (Family Medicine) ? ?CHIEF COMPLAINT: Iron deficiency anemia ? ?INTERVAL HISTORY: Patient returns to clinic today for further evaluation and follow up for anemia. She complains of recurrent weakness and fatigue. She reports black stools. Has history of chronic AVMs. Continues to smoke. No neurologic complaints. Denies any recent fevers or illnesses. She has a good appetite and denies weight loss. She denies chest pain, shortness of breath, cough, or hemoptysis. She denies nausea, vomiting, constipation. Endorses diarrhea. She has no urinary complaints. No additional complaints today.  ? ?REVIEW OF SYSTEMS:   ?Review of Systems  ?Constitutional:  Positive for malaise/fatigue. Negative for fever and weight loss.  ?Respiratory: Negative.  Negative for cough and shortness of breath.   ?Cardiovascular: Negative.  Negative for chest pain and leg swelling.  ?Gastrointestinal:  Positive for diarrhea and melena. Negative for abdominal pain and blood in stool.  ?Genitourinary: Negative.  Negative for hematuria.  ?Musculoskeletal: Negative.  Negative for back pain.  ?Skin: Negative.  Negative for rash.  ?Neurological:  Positive for weakness. Negative for dizziness, focal weakness and headaches.  ?Psychiatric/Behavioral: Negative.  The patient is not nervous/anxious.   ?As per HPI. Otherwise, a complete review of systems is negative. ? ? ?PAST MEDICAL HISTORY: ?Past Medical History:  ?Diagnosis Date  ? Anemia   ? Arthritis   ? Breast cancer ([place]) 1990  ? Right  ? COPD (chronic obstructive pulmonary disease) ([place])   ? Dyspnea   ? DOE  ? Elevated lipids   ? Hypertension   ? Lower extremity edema   ? Migraines   ? Migraines   ? Personal history of radiation therapy 1990  ? Breast  ? Restless leg syndrome   ? Rotator cuff  injury   ? ? ?PAST SURGICAL HISTORY: ?Past Surgical History:  ?Procedure Laterality Date  ? ABDOMINAL HYSTERECTOMY    ? BACK SURGERY  08/2017  ? CERVICAL FUSION  ? BREAST BIOPSY Right 1990  ? LUMPECTOMY.  took lymph nodes  ? BREAST LUMPECTOMY    ? COLONOSCOPY WITH PROPOFOL N/A 07/20/2017  ? Procedure: COLONOSCOPY WITH PROPOFOL;  Surgeon: Virgel Manifold, MD;  Location: [facility] ENDOSCOPY;  Service: Endoscopy;  Laterality: N/A;  ? ENTEROSCOPY N/A 08/08/2017  ? Procedure: ENTEROSCOPY;  Surgeon: Lin Landsman, MD;  Location: [facility] ENDOSCOPY;  Service: Gastroenterology;  Laterality: N/A;  ? ENTEROSCOPY N/A 05/07/2020  ? Procedure: ENTEROSCOPY with Adult colonoscope;  Surgeon: Virgel Manifold, MD;  Location: [facility] ENDOSCOPY;  Service: Endoscopy;  Laterality: N/A;  ? ENTEROSCOPY N/A 03/17/2021  ? Procedure: ENTEROSCOPY;  Surgeon: Virgel Manifold, MD;  Location: [name] ENDOSCOPY;  Service: Endoscopy;  Laterality: N/A;  PUSH  ? ENTEROSCOPY N/A 07/13/2021  ? Procedure: ENTEROSCOPY;  Surgeon: Lin Landsman, MD;  Location: [facility] ENDOSCOPY;  Service: Gastroenterology;  Laterality: N/A;  ? ESOPHAGOGASTRODUODENOSCOPY Left 04/13/2017  ? Procedure: ESOPHAGOGASTRODUODENOSCOPY (EGD);  Surgeon: Virgel Manifold, MD;  Location: [facility] ENDOSCOPY;  Service: Endoscopy;  Laterality: Left;  ? ESOPHAGOGASTRODUODENOSCOPY (EGD) WITH PROPOFOL N/A 07/20/2017  ? Procedure: ESOPHAGOGASTRODUODENOSCOPY (EGD) WITH PROPOFOL;  Surgeon: Virgel Manifold, MD;  Location: [facility] ENDOSCOPY;  Service: Endoscopy;  Laterality: N/A;  ? ESOPHAGOGASTRODUODENOSCOPY (EGD) WITH PROPOFOL N/A 12/12/2017  ? Procedure: ESOPHAGOGASTRODUODENOSCOPY (EGD) WITH PROPOFOL;  Surgeon: Virgel Manifold, MD;  Location: [facility] ENDOSCOPY;  Service: Endoscopy;  Laterality: N/A;  ?  ESOPHAGOGASTRODUODENOSCOPY (EGD) WITH PROPOFOL N/A 12/23/2020  ? Procedure: ESOPHAGOGASTRODUODENOSCOPY (EGD) WITH PROPOFOL;  Surgeon: Virgel Manifold, MD;  Location: [facility]  ENDOSCOPY;  Service: Endoscopy;  Laterality: N/A;  ? GIVENS CAPSULE STUDY N/A 03/10/2021  ? Procedure: GIVENS CAPSULE STUDY;  Surgeon: Virgel Manifold, MD;  Location: [facility] ENDOSCOPY;  Service: Endoscopy;  Laterality: N/A;  ? MEDIAL PARTIAL KNEE REPLACEMENT Left 1990  ? torn ligament. no knee replacement at that time  ? PARTIAL HYSTERECTOMY    ? SHOULDER ARTHROSCOPY WITH OPEN ROTATOR CUFF REPAIR Left 03/01/2016  ? Procedure: SHOULDER ARTHROSCOPY WITH OPEN ROTATOR CUFF REPAIR;  Surgeon: Thornton Park, MD;  Location: [facility] ORS;  Service: Orthopedics;  Laterality: Left;  ? TOTAL KNEE ARTHROPLASTY Left 02/14/2018  ? Procedure: TOTAL KNEE ARTHROPLASTY;  Surgeon: Thornton Park, MD;  Location: [facility] ORS;  Service: Orthopedics;  Laterality: Left;  ? ? ?FAMILY HISTORY: ?Family History  ?Problem Relation Age of Onset  ? CAD Mother   ? CAD Sister   ? Breast cancer Neg Hx   ? ? ?ADVANCED DIRECTIVES (Y/N):  N ? ? ?HEALTH MAINTENANCE: ?Social History  ? ?Tobacco Use  ? Smoking status: Some Days  ?  Packs/day: 0.25  ?  Years: 50.00  ?  Pack years: 12.50  ?  Types: Cigarettes  ?  Last attempt to quit: 03/13/2016  ?  Years since quitting: 5.5  ? Smokeless tobacco: Never  ?Vaping Use  ? Vaping Use: Never used  ?Substance Use Topics  ? Alcohol use: No  ? Drug use: No  ? ? ? Colonoscopy: ? PAP: ? Bone density: ? Lipid panel: ? ? ?Allergies  ?Allergen Reactions  ? Latex Rash  ?  Welts over body  ? ? ?Current Outpatient Medications  ?Medication Sig Dispense Refill  ? acetaminophen (TYLENOL) 650 MG CR tablet Take 650 mg by mouth every 8 (eight) hours as needed for pain.    ? albuterol (VENTOLIN HFA) 108 (90 Base) MCG/ACT inhaler Inhale 2 puffs into the lungs every 6 (six) hours as needed for wheezing.    ? amitriptyline (ELAVIL) 25 MG tablet Take 25 mg by mouth at bedtime.    ? budesonide-formoterol (SYMBICORT) 160-4.5 MCG/ACT inhaler Inhale 2 puffs into the lungs 2 (two) times daily.    ? diphenhydramine-acetaminophen (TYLENOL PM)  25-500 MG TABS tablet Take 1-2 tablets by mouth at bedtime as needed (pain).    ? furosemide (LASIX) 20 MG tablet Take 20 mg daily by mouth.     ? lisinopril (PRINIVIL,ZESTRIL) 20 MG tablet Take 20 mg by mouth daily.    ? potassium chloride (MICRO-K) 10 MEQ CR capsule Take 10 mEq by mouth daily.    ? rOPINIRole (REQUIP) 2 MG tablet Take 2-4 mg by mouth at bedtime as needed (restless leg syndrome).    ? simvastatin (ZOCOR) 20 MG tablet Take 20 mg by mouth daily.     ? tiotropium (SPIRIVA) 18 MCG inhalation capsule Place 1 capsule (18 mcg total) into inhaler and inhale daily. 30 capsule 0  ? vitamin B-12 (CYANOCOBALAMIN) 1000 MCG tablet Take 1,000 mcg by mouth daily.    ? ?No current facility-administered medications for this visit.  ? ?Facility-Administered Medications Ordered in Other Visits  ?Medication Dose Route Frequency Provider Last Rate Last Admin  ? 0.9 %  sodium chloride infusion   Intravenous Continuous Sindy Guadeloupe, MD 0 mL/hr at 05/23/21 1452 New Bag at 07/13/21 1223  ? ? ?OBJECTIVE: ?Vitals:  ? 10/04/21 1315  ?BP: 113/68  ?  Pulse: 94  ?Resp: 16  ?Temp: (!) 96.4 ?F (35.8 ?C)  ?SpO2: 100%  ?   Body mass index is 31.24 kg/m?Marland Kitchen    ECOG FS:0 - Asymptomatic ? ?General: Well-developed, well-nourished, no acute distress. ?Eyes: Pink conjunctiva, anicteric sclera. ?HEENT: Normocephalic, moist mucous membranes. ?Lungs: No audible wheezing or coughing. ?Heart: Regular rate and rhythm. ?Abdomen: Soft, nontender, no obvious distention. ?Musculoskeletal: No edema, cyanosis, or clubbing. ?Neuro: Alert, answering all questions appropriately. Cranial nerves grossly intact. ?Skin: No rashes or petechiae noted. Pale ?Psych: Normal affect. ? ?LAB RESULTS: ? ?Lab Results  ?Component Value Date  ? NA 137 07/13/2021  ? K 3.8 07/13/2021  ? CL 105 07/13/2021  ? CO2 27 07/13/2021  ? GLUCOSE 90 07/13/2021  ? BUN 18 07/13/2021  ? CREATININE 0.93 07/13/2021  ? CALCIUM 8.5 (L) 07/13/2021  ? PROT 6.1 (L) 07/12/2021  ? ALBUMIN 3.7  07/12/2021  ? AST 16 07/12/2021  ? ALT 12 07/12/2021  ? ALKPHOS 32 (L) 07/12/2021  ? BILITOT 0.3 07/12/2021  ? GFRNONAA >60 07/13/2021  ? GFRAA >60 02/16/2018  ? ? ?Lab Results  ?Component Value Date  ? WBC 4.9 05

## 2021-10-05 ENCOUNTER — Inpatient Hospital Stay: Payer: Medicare Other

## 2021-10-05 DIAGNOSIS — D649 Anemia, unspecified: Secondary | ICD-10-CM

## 2021-10-05 DIAGNOSIS — D509 Iron deficiency anemia, unspecified: Secondary | ICD-10-CM | POA: Diagnosis not present

## 2021-10-05 MED ORDER — SODIUM CHLORIDE 0.9% IV SOLUTION
250.0000 mL | Freq: Once | INTRAVENOUS | Status: AC
  Administered 2021-10-05: 250 mL via INTRAVENOUS
  Filled 2021-10-05: qty 250

## 2021-10-05 NOTE — Patient Instructions (Signed)
[facility] CANCER CTR AT [place]  Discharge Instructions: ?Thank you for choosing [facility] to provide your oncology and hematology care.  ?If you have a lab appointment with the [facility], please go directly to the [facility] and check in at the registration area. ? ?Wear comfortable clothing and clothing appropriate for easy access to any Portacath or PICC line.  ? ?We strive to give you quality time with your provider. You may need to reschedule your appointment if you arrive late (15 or more minutes).  Arriving late affects you and other patients whose appointments are after yours.  Also, if you miss three or more appointments without notifying the office, you may be dismissed from the clinic at the provider?s discretion.    ?  ?For prescription refill requests, have your pharmacy contact our office and allow 72 hours for refills to be completed.   ? ?Today you received the following chemotherapy and/or immunotherapy agents BLOOD - one unit    ?  ?To help prevent nausea and vomiting after your treatment, we encourage you to take your nausea medication as directed. ? ?BELOW ARE SYMPTOMS THAT SHOULD BE REPORTED IMMEDIATELY: ?*FEVER GREATER THAN 100.4 F (38 ?C) OR HIGHER ?*CHILLS OR SWEATING ?*NAUSEA AND VOMITING THAT IS NOT CONTROLLED WITH YOUR NAUSEA MEDICATION ?*UNUSUAL SHORTNESS OF BREATH ?*UNUSUAL BRUISING OR BLEEDING ?*URINARY PROBLEMS (pain or burning when urinating, or frequent urination) ?*BOWEL PROBLEMS (unusual diarrhea, constipation, pain near the anus) ?TENDERNESS IN MOUTH AND THROAT WITH OR WITHOUT PRESENCE OF ULCERS (sore throat, sores in mouth, or a toothache) ?UNUSUAL RASH, SWELLING OR PAIN  ?UNUSUAL VAGINAL DISCHARGE OR ITCHING  ? ?Items with * indicate a potential emergency and should be followed up as soon as possible or go to the Emergency Department if any problems should occur. ? ?Please show the CHEMOTHERAPY ALERT CARD or IMMUNOTHERAPY ALERT CARD at  check-in to the Emergency Department and triage nurse. ? ?Should you have questions after your visit or need to cancel or reschedule your appointment, please contact [facility] CANCER [place] AT [place]  [phone] and follow the prompts.  Office hours are 8:00 a.m. to 4:30 p.m. Monday - Friday. Please note that voicemails left after 4:00 p.m. may not be returned until the following business day.  We are closed weekends and major holidays. You have access to a nurse at all times for urgent questions. Please call the main number to the clinic [phone] and follow the prompts. ? ?For any non-urgent questions, you may also contact your provider using MyChart. We now offer e-Visits for anyone 34 and older to request care online for non-urgent symptoms. For details visit [email or web address]. ?  ?Also download the MyChart app! Go to the app store, search "MyChart", open the app, select [facility], and log in with your MyChart username and password. ? ?Due to Covid, a mask is required upon entering the hospital/clinic. If you do not have a mask, one will be given to you upon arrival. For doctor visits, patients may have 1 support person aged 46 or older with them. For treatment visits, patients cannot have anyone with them due to current Covid guidelines and our immunocompromised population.  ? ?Blood Transfusion, Adult, Care After ?This sheet gives you information about how to care for yourself after your procedure. Your doctor may also give you more specific instructions. If you have problems or questions, contact your doctor. ?What can I expect after the procedure? ?After the procedure, it is common to have: ?  Bruising and soreness at the IV site. ?A headache. ?Follow these instructions at home: ?Insertion site care ? ?  ? ?Follow instructions from your doctor about how to take care of your insertion site. This is where an IV tube was put into your vein. Make sure you: ?Wash your hands with soap and  water before and after you change your bandage (dressing). If you cannot use soap and water, use hand sanitizer. ?Change your bandage as told by your doctor. ?Check your insertion site every day for signs of infection. Check for: ?Redness, swelling, or pain. ?Bleeding from the site. ?Warmth. ?Pus or a bad smell. ?General instructions ?Take over-the-counter and prescription medicines only as told by your doctor. ?Rest as told by your doctor. ?Go back to your normal activities as told by your doctor. ?Keep all follow-up visits as told by your doctor. This is important. ?Contact a doctor if: ?You have itching or red, swollen areas of skin (hives). ?You feel worried or nervous (anxious). ?You feel weak after doing your normal activities. ?You have redness, swelling, warmth, or pain around the insertion site. ?You have blood coming from the insertion site, and the blood does not stop with pressure. ?You have pus or a bad smell coming from the insertion site. ?Get help right away if: ?You have signs of a serious reaction. This may be coming from an allergy or the body's defense system (immune system). Signs include: ?Trouble breathing or shortness of breath. ?Swelling of the face or feeling warm (flushed). ?Fever or chills. ?Head, chest, or back pain. ?Dark pee (urine) or blood in the pee. ?Widespread rash. ?Fast heartbeat. ?Feeling dizzy or light-headed. ?You may receive your blood transfusion in an outpatient setting. If so, you will be told whom to contact to report any reactions. ?These symptoms may be an emergency. Do not wait to see if the symptoms will go away. Get medical help right away. Call your local emergency services (911 in the [place] Do not drive yourself to the hospital. ?Summary ?Bruising and soreness at the IV site are common. ?Check your insertion site every day for signs of infection. ?Rest as told by your doctor. Go back to your normal activities as told by your doctor. ?Get help right away if you  have signs of a serious reaction. ?This information is not intended to replace advice given to you by your health care provider. Make sure you discuss any questions you have with your health care provider. ?Document Revised: 09/16/2020 Document Reviewed: 11/14/2018 ?Elsevier Patient Education ? [place]. ? ?

## 2021-10-06 LAB — TYPE AND SCREEN
ABO/RH(D): O POS
Antibody Screen: NEGATIVE
Unit division: 0

## 2021-10-06 LAB — BPAM RBC
Blood Product Expiration Date: 202305312359
ISSUE DATE / TIME: 202305030830
Unit Type and Rh: 5100

## 2021-10-11 ENCOUNTER — Inpatient Hospital Stay: Payer: Medicare Other

## 2021-10-11 VITALS — BP 111/64 | HR 85 | Temp 97.0°F | Resp 18

## 2021-10-11 DIAGNOSIS — D509 Iron deficiency anemia, unspecified: Secondary | ICD-10-CM | POA: Diagnosis not present

## 2021-10-11 MED ORDER — SODIUM CHLORIDE 0.9 % IV SOLN: 200.0000 mg | Freq: Once | INTRAVENOUS | Status: DC

## 2021-10-11 MED ORDER — IRON SUCROSE 20 MG/ML IV SOLN
200.0000 mg | Freq: Once | INTRAVENOUS | Status: AC
  Administered 2021-10-11: 200 mg via INTRAVENOUS
  Filled 2021-10-11: qty 10

## 2021-10-11 MED ORDER — SODIUM CHLORIDE 0.9 % IV SOLN
Freq: Once | INTRAVENOUS | Status: AC
  Filled 2021-10-11: qty 250

## 2021-10-17 ENCOUNTER — Ambulatory Visit (INDEPENDENT_AMBULATORY_CARE_PROVIDER_SITE_OTHER): Payer: Medicare Other | Admitting: Gastroenterology

## 2021-10-17 ENCOUNTER — Encounter: Payer: Self-pay | Admitting: Gastroenterology

## 2021-10-17 ENCOUNTER — Other Ambulatory Visit: Payer: Self-pay

## 2021-10-17 VITALS — BP 124/76 | HR 106 | Temp 99.0°F | Ht 64.0 in | Wt 185.0 lb

## 2021-10-17 DIAGNOSIS — K5520 Angiodysplasia of colon without hemorrhage: Secondary | ICD-10-CM | POA: Diagnosis not present

## 2021-10-17 DIAGNOSIS — D500 Iron deficiency anemia secondary to blood loss (chronic): Secondary | ICD-10-CM | POA: Diagnosis not present

## 2021-10-17 DIAGNOSIS — Q2730 Arteriovenous malformation, site unspecified: Secondary | ICD-10-CM

## 2021-10-17 NOTE — Progress Notes (Signed)
?  ?Cephas Darby, MD ?[address]  ?[address]  ?[place], [place] [address]  ?Main: [phone]  ?Fax: [phone] ? ? ? ?Gastroenterology Consultation ? ?Referring Provider:     Letta Median, MD ?Primary Care Physician:  Letta Median, MD ?Primary Gastroenterologist:  Dr. Cephas Darby ?Reason for Consultation:   Iron deficiency anemia ?      ? HPI:   ?Hailey Farrell is a 71 y.o. female referred by Dr. Rebeca Alert, Durene Cal, MD  for consultation & management of chronic iron deficiency anemia.  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 pylori.  Patient has been receiving parenteral iron therapy.  Patient was admitted to [facility] on 07/12/2021 secondary to acute on chronic iron deficiency anemia, hemoglobin dropped to 5.8 from 8.2, received blood transfusion as well as iron infusions.  She reports followed by hematology since discharge, has been receiving parenteral iron therapy.  She recently had another acute on chronic blood loss anemia on 5/8, preceding this she reported 2 to 3 days of black stools, her repeat hemoglobin dropped from 9.8 to 6.5, she received 1 unit of PRBCs at the cancer center along with IV iron.  She is currently scheduled to receive weekly iron infusions.  Patient currently reports having brown bowel movements. ? 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  Patient does smoke cigarettes, 1 pack last for 1 week ? ?NSAIDs: None ? ?Antiplts/Anticoagulants/Anti thrombotics: None ? ?GI Procedures: Reviewed under procedures tab ?Colonoscopy 07/20/2017 ?- One 5 mm polyp in the sigmoid colon, removed with a cold snare. Resected and retrieved. ?- One 2 mm polyp in the sigmoid colon, removed with a cold biopsy forceps. Resected and retrieved. ?- Thickened folds of the mucosa in the cecum.  Biopsied. ?- Internal hemorrhoids. ?- The examination was otherwise normal. ? ?Past Medical History:  ?Diagnosis Date  ? Anemia   ? Arthritis   ? Breast cancer ([place]) 1990  ? Right  ? COPD (chronic obstructive pulmonary disease) ([place])   ? Dyspnea   ? DOE  ? Elevated lipids   ? Hypertension   ? Lower extremity edema   ? Migraines   ? Migraines   ? Personal history of radiation therapy 1990  ? Breast  ? Restless leg syndrome   ? Rotator cuff injury   ? ? ?Past Surgical History:  ?Procedure Laterality Date  ? ABDOMINAL HYSTERECTOMY    ? BACK SURGERY  08/2017  ? CERVICAL FUSION  ? BREAST BIOPSY Right 1990  ? LUMPECTOMY.  took lymph nodes  ? BREAST LUMPECTOMY    ? COLONOSCOPY WITH PROPOFOL N/A 07/20/2017  ? Procedure: COLONOSCOPY WITH PROPOFOL;  Surgeon: Virgel Manifold, MD;  Location: [facility] ENDOSCOPY;  Service: Endoscopy;  Laterality: N/A;  ? ENTEROSCOPY N/A 08/08/2017  ? Procedure: ENTEROSCOPY;  Surgeon: Lin Landsman, MD;  Location: [facility] ENDOSCOPY;  Service: Gastroenterology;  Laterality: N/A;  ? ENTEROSCOPY N/A 05/07/2020  ? Procedure: ENTEROSCOPY with Adult colonoscope;  Surgeon: Virgel Manifold, MD;  Location: [facility] ENDOSCOPY;  Service: Endoscopy;  Laterality: N/A;  ? ENTEROSCOPY N/A 03/17/2021  ? Procedure: ENTEROSCOPY;  Surgeon: Virgel Manifold, MD;  Location: [facility] ENDOSCOPY;  Service: Endoscopy;  Laterality: N/A;  PUSH  ? ENTEROSCOPY N/A 07/13/2021  ? Procedure: ENTEROSCOPY;  Surgeon: Lin Landsman, MD;  Location: [facility] ENDOSCOPY;  Service: Gastroenterology;  Laterality: N/A;  ? ESOPHAGOGASTRODUODENOSCOPY Left 04/13/2017  ? Procedure: ESOPHAGOGASTRODUODENOSCOPY (EGD);  Surgeon: Virgel Manifold, MD;  Location: [facility] ENDOSCOPY;  Service: Endoscopy;  Laterality: Left;  ? ESOPHAGOGASTRODUODENOSCOPY (EGD) WITH PROPOFOL N/A 07/20/2017  ? Procedure: ESOPHAGOGASTRODUODENOSCOPY (EGD) WITH PROPOFOL;  Surgeon: Virgel Manifold, MD;  Location: [facility] ENDOSCOPY;  Service: Endoscopy;  Laterality:  N/A;  ? ESOPHAGOGASTRODUODENOSCOPY (EGD) WITH PROPOFOL N/A 12/12/2017  ? Procedure: ESOPHAGOGASTRODUODENOSCOPY (EGD) WITH PROPOFOL;  Surgeon: Virgel Manifold, MD;  Location: [facility] ENDOSCOPY;  Service: Endoscopy;  Laterality: N/A;  ? ESOPHAGOGASTRODUODENOSCOPY (EGD) WITH PROPOFOL N/A 12/23/2020  ? Procedure: ESOPHAGOGASTRODUODENOSCOPY (EGD) WITH PROPOFOL;  Surgeon: Virgel Manifold, MD;  Location: [facility] ENDOSCOPY;  Service: Endoscopy;  Laterality: N/A;  ? GIVENS CAPSULE STUDY N/A 03/10/2021  ? Procedure: GIVENS CAPSULE STUDY;  Surgeon: Virgel Manifold, MD;  Location: [facility] ENDOSCOPY;  Service: Endoscopy;  Laterality: N/A;  ? MEDIAL PARTIAL KNEE REPLACEMENT Left 1990  ? torn ligament. no knee replacement at that time  ? PARTIAL HYSTERECTOMY    ? SHOULDER ARTHROSCOPY WITH OPEN ROTATOR CUFF REPAIR Left 03/01/2016  ? Procedure: SHOULDER ARTHROSCOPY WITH OPEN ROTATOR CUFF REPAIR;  Surgeon: Thornton Park, MD;  Location: [facility] ORS;  Service: Orthopedics;  Laterality: Left;  ? TOTAL KNEE ARTHROPLASTY Left 02/14/2018  ? Procedure: TOTAL KNEE ARTHROPLASTY;  Surgeon: Thornton Park, MD;  Location: [facility] ORS;  Service: Orthopedics;  Laterality: Left;  ? ? ?Current Outpatient Medications:  ?  acetaminophen (TYLENOL) 650 MG CR tablet, Take 650 mg by mouth every 8 (eight) hours as needed for pain., Disp: , Rfl:  ?  albuterol (VENTOLIN HFA) 108 (90 Base) MCG/ACT inhaler, Inhale 2 puffs into the lungs every 6 (six) hours as needed for wheezing., Disp: , Rfl:  ?  amitriptyline (ELAVIL) 25 MG tablet, Take 25 mg by mouth at bedtime., Disp: , Rfl:  ?  budesonide-formoterol (SYMBICORT) 160-4.5 MCG/ACT inhaler, Inhale 2 puffs into the lungs 2 (two) times daily., Disp: , Rfl:  ?  diphenhydramine-acetaminophen (TYLENOL PM) 25-500 MG TABS tablet, Take 1-2 tablets by mouth at bedtime as needed (pain)., Disp: , Rfl:  ?  furosemide (LASIX) 20 MG tablet, Take 20 mg daily by mouth. , Disp: , Rfl:  ?  lisinopril (PRINIVIL,ZESTRIL) 20 MG  tablet, Take 20 mg by mouth daily., Disp: , Rfl:  ?  potassium chloride (MICRO-K) 10 MEQ CR capsule, Take 10 mEq by mouth daily., Disp: , Rfl:  ?  rOPINIRole (REQUIP) 2 MG tablet, Take 2-4 mg by mouth at bedtime as needed (restless leg syndrome)., Disp: , Rfl:  ?  simvastatin (ZOCOR) 20 MG tablet, Take 20 mg by mouth daily. , Disp: , Rfl:  ?  tiotropium (SPIRIVA) 18 MCG inhalation capsule, Place 1 capsule (18 mcg total) into inhaler and inhale daily., Disp: 30 capsule, Rfl: 0 ?  vitamin B-12 (CYANOCOBALAMIN) 1000 MCG tablet, Take 1,000 mcg by mouth daily., Disp: , Rfl:  ?No current facility-administered medications for this visit. ? ?Facility-Administered Medications Ordered in Other Visits:  ?  0.9 %  sodium chloride infusion, , Intravenous, Continuous, Sindy Guadeloupe, MD, Last Rate: 0 mL/hr at 05/23/21 1452, New Bag at 07/13/21 1223 ? ? ? ?Family History  ?Problem Relation Age of Onset  ? CAD Mother   ? CAD Sister   ? Breast cancer Neg Hx   ?  ? ?Social History  ? ?Tobacco Use  ? Smoking status: Some Days  ?  Packs/day: 0.25  ?  Years: 50.00  ?  Pack years: 12.50  ?  Types: Cigarettes  ?  Last attempt to quit: 03/13/2016  ?  Years since quitting: 5.6  ? Smokeless tobacco: Never  ?Vaping Use  ? Vaping Use: Never used  ?Substance Use Topics  ? Alcohol use: No  ? Drug use: No  ? ? ?Allergies as of 10/17/2021 - Review Complete 10/17/2021  ?Allergen Reaction Noted  ? Latex Rash 02/03/2015  ? ? ?Review of Systems:    ?All systems reviewed and negative except where noted in HPI. ? ? Physical Exam:  ?BP 124/76 (BP Location: Left Arm, Patient Position: Sitting, Cuff Size: Normal)   Pulse (!) 106   Temp 99 ?F (37.2 ?C) (Oral)   Ht '5\' 4"'$  (1.626 m)   Wt 185 lb (83.9 kg)   BMI 31.76 kg/m?  ?No LMP recorded. Patient has had a hysterectomy. ? ?General:   Alert,  Well-developed, well-nourished, pleasant and cooperative in NAD ?Head:  Normocephalic and atraumatic. ?Eyes:  Sclera clear, no icterus.   Conjunctiva pink. ?Ears:   Normal auditory acuity. ?Nose:  No deformity, discharge, or lesions. ?Mouth:  No deformity or lesions,oropharynx pink & moist. ?Neck:  Supple; no masses or thyromegaly. ?Lungs:  Respirations even and unlabore

## 2021-10-18 ENCOUNTER — Other Ambulatory Visit: Payer: Self-pay | Admitting: Nurse Practitioner

## 2021-10-18 ENCOUNTER — Inpatient Hospital Stay: Payer: Medicare Other

## 2021-10-18 ENCOUNTER — Encounter: Payer: Self-pay | Admitting: Oncology

## 2021-10-18 VITALS — BP 140/70 | HR 102 | Temp 97.5°F

## 2021-10-18 DIAGNOSIS — D509 Iron deficiency anemia, unspecified: Secondary | ICD-10-CM | POA: Diagnosis not present

## 2021-10-18 DIAGNOSIS — K5521 Angiodysplasia of colon with hemorrhage: Secondary | ICD-10-CM

## 2021-10-18 MED ORDER — IRON SUCROSE 20 MG/ML IV SOLN
200.0000 mg | Freq: Once | INTRAVENOUS | Status: AC
  Administered 2021-10-18: 200 mg via INTRAVENOUS
  Filled 2021-10-18: qty 10

## 2021-10-18 MED ORDER — SODIUM CHLORIDE 0.9 % IV SOLN
INTRAVENOUS | Status: DC
  Filled 2021-10-18: qty 250

## 2021-10-18 MED ORDER — SODIUM CHLORIDE 0.9 % IV SOLN: 200.0000 mg | Freq: Once | INTRAVENOUS | Status: DC

## 2021-10-18 NOTE — Progress Notes (Signed)
Dr. Marius Ditch recommended adding octreotide injection for patient for management of GI bleeding. Per [employer], 10 mg IM once monthly recommended. I discussed with patient who agrees. Added to treatment plan and will request authorization for administration at her next appointment which is on 5/23.  ?

## 2021-10-25 ENCOUNTER — Inpatient Hospital Stay: Payer: Medicare Other

## 2021-10-26 ENCOUNTER — Other Ambulatory Visit: Payer: Self-pay | Admitting: Gastroenterology

## 2021-10-26 ENCOUNTER — Encounter
Admission: RE | Disposition: A | Discharge status: Home / Self Care | Payer: Self-pay | Source: Home / Self Care | Attending: Gastroenterology

## 2021-10-26 ENCOUNTER — Other Ambulatory Visit: Payer: Self-pay

## 2021-10-26 ENCOUNTER — Ambulatory Visit
Admission: RE | Admit: 2021-10-26 | Discharge: 2021-10-26 | Disposition: A | Discharge status: Home / Self Care | Payer: Medicare Other | Attending: Gastroenterology | Admitting: Gastroenterology

## 2021-10-26 ENCOUNTER — Ambulatory Visit: Payer: Medicare Other | Admitting: Registered Nurse

## 2021-10-26 ENCOUNTER — Encounter: Payer: Self-pay | Admitting: Gastroenterology

## 2021-10-26 DIAGNOSIS — Q2739 Arteriovenous malformation, other site: Secondary | ICD-10-CM | POA: Insufficient documentation

## 2021-10-26 DIAGNOSIS — I10 Essential (primary) hypertension: Secondary | ICD-10-CM | POA: Diagnosis not present

## 2021-10-26 DIAGNOSIS — K449 Diaphragmatic hernia without obstruction or gangrene: Secondary | ICD-10-CM | POA: Insufficient documentation

## 2021-10-26 DIAGNOSIS — D500 Iron deficiency anemia secondary to blood loss (chronic): Secondary | ICD-10-CM

## 2021-10-26 DIAGNOSIS — E669 Obesity, unspecified: Secondary | ICD-10-CM | POA: Insufficient documentation

## 2021-10-26 DIAGNOSIS — J449 Chronic obstructive pulmonary disease, unspecified: Secondary | ICD-10-CM | POA: Diagnosis not present

## 2021-10-26 DIAGNOSIS — Q2730 Arteriovenous malformation, site unspecified: Secondary | ICD-10-CM | POA: Diagnosis not present

## 2021-10-26 DIAGNOSIS — K31811 Angiodysplasia of stomach and duodenum with bleeding: Secondary | ICD-10-CM | POA: Diagnosis present

## 2021-10-26 DIAGNOSIS — Z6832 Body mass index (BMI) 32.0-32.9, adult: Secondary | ICD-10-CM | POA: Insufficient documentation

## 2021-10-26 DIAGNOSIS — F17210 Nicotine dependence, cigarettes, uncomplicated: Secondary | ICD-10-CM | POA: Diagnosis not present

## 2021-10-26 HISTORY — PX: ENTEROSCOPY: SHX5533

## 2021-10-26 LAB — CBC
HCT: 27.8 % — ABNORMAL LOW (ref 36.0–46.0)
Hemoglobin: 8.2 g/dL — ABNORMAL LOW (ref 12.0–15.0)
MCH: 24.3 pg — ABNORMAL LOW (ref 26.0–34.0)
MCHC: 29.5 g/dL — ABNORMAL LOW (ref 30.0–36.0)
MCV: 82.2 fL (ref 80.0–100.0)
Platelets: 338 10*3/uL (ref 150–400)
RBC: 3.38 MIL/uL — ABNORMAL LOW (ref 3.87–5.11)
RDW: 22.2 % — ABNORMAL HIGH (ref 11.5–15.5)
WBC: 2.9 10*3/uL — ABNORMAL LOW (ref 4.0–10.5)
nRBC: 0 % (ref 0.0–0.2)

## 2021-10-26 LAB — IRON AND TIBC
Iron: 21 ug/dL — ABNORMAL LOW (ref 28–170)
Saturation Ratios: 5 % — ABNORMAL LOW (ref 10.4–31.8)
TIBC: 395 ug/dL (ref 250–450)
UIBC: 374 ug/dL

## 2021-10-26 LAB — FERRITIN: Ferritin: 63 ng/mL (ref 11–307)

## 2021-10-26 SURGERY — ENTEROSCOPY
Anesthesia: General

## 2021-10-26 MED ORDER — PROPOFOL 10 MG/ML IV BOLUS
INTRAVENOUS | Status: DC | PRN
  Administered 2021-10-26: 70 mg via INTRAVENOUS

## 2021-10-26 MED ORDER — SODIUM CHLORIDE 0.9 % IV SOLN: INTRAVENOUS | Status: DC

## 2021-10-26 MED ORDER — PROPOFOL 500 MG/50ML IV EMUL
INTRAVENOUS | Status: DC | PRN
  Administered 2021-10-26: 140 ug/kg/min via INTRAVENOUS

## 2021-10-26 MED ORDER — PROPOFOL 10 MG/ML IV BOLUS
INTRAVENOUS | Status: DC | PRN
Start: 2021-10-26 — End: 2021-10-26

## 2021-10-26 MED ORDER — LIDOCAINE HCL (CARDIAC) PF 100 MG/5ML IV SOSY
PREFILLED_SYRINGE | INTRAVENOUS | Status: DC | PRN
Start: 2021-10-26 — End: 2021-10-26
  Administered 2021-10-26: 60 mg via INTRAVENOUS

## 2021-10-26 NOTE — Anesthesia Postprocedure Evaluation (Signed)
Anesthesia Post Note  Patient: Hailey Farrell  Procedure(s) Performed: ENTEROSCOPY  Patient location during evaluation: PACU Anesthesia Type: General Level of consciousness: awake and alert, oriented and patient cooperative Pain management: pain level controlled Vital Signs Assessment: post-procedure vital signs reviewed and stable Respiratory status: spontaneous breathing, nonlabored ventilation and respiratory function stable Cardiovascular status: blood pressure returned to baseline and stable Postop Assessment: adequate PO intake Anesthetic complications: no   No notable events documented.   Last Vitals:  Vitals:   10/26/21 1150 10/26/21 1200  BP: 112/65 108/78  Pulse: 92 84  Resp: 16 15  Temp:    SpO2: 95% 99%    Last Pain:  Vitals:   10/26/21 1200  TempSrc:   PainSc: 0-No pain                 Darrin Nipper

## 2021-10-26 NOTE — Anesthesia Preprocedure Evaluation (Addendum)
Anesthesia Evaluation  Patient identified by MRN, date of birth, ID band Patient awake    Reviewed: Allergy & Precautions, NPO status , Patient's Chart, lab work & pertinent test results  History of Anesthesia Complications Negative for: history of anesthetic complications  Airway Mallampati: III   Neck ROM: Full    Dental  (+) Missing   Pulmonary COPD, Current Smoker (1.5 cigarettes per day) and Patient abstained from smoking.,    Pulmonary exam normal breath sounds clear to auscultation       Cardiovascular hypertension, Normal cardiovascular exam Rhythm:Regular Rate:Normal  ECG 07/15/21: ST (HR 103), nonspecific ST abnormality   Neuro/Psych  Headaches,    GI/Hepatic GI bleed   Endo/Other  Obesity   Renal/GU negative Renal ROS     Musculoskeletal  (+) Arthritis ,   Abdominal   Peds  Hematology  (+) Blood dyscrasia, anemia , Breast CA   Anesthesia Other Findings   Reproductive/Obstetrics                            Anesthesia Physical Anesthesia Plan  ASA: 3  Anesthesia Plan: General   Post-op Pain Management:    Induction: Intravenous  PONV Risk Score and Plan: 2 and Propofol infusion, TIVA and Treatment may vary due to age or medical condition  Airway Management Planned: Natural Airway  Additional Equipment:   Intra-op Plan:   Post-operative Plan:   Informed Consent: I have reviewed the patients History and Physical, chart, labs and discussed the procedure including the risks, benefits and alternatives for the proposed anesthesia with the patient or authorized representative who has indicated his/her understanding and acceptance.       Plan Discussed with: CRNA  Anesthesia Plan Comments: (LMA/GETA backup discussed.  Patient consented for risks of anesthesia including but not limited to:  - adverse reactions to medications - damage to eyes, teeth, lips or other oral  mucosa - nerve damage due to positioning  - sore throat or hoarseness - damage to heart, brain, nerves, lungs, other parts of body or loss of life  Informed patient about role of CRNA in peri- and intra-operative care.  Patient voiced understanding.)        Anesthesia Quick Evaluation

## 2021-10-26 NOTE — H&P (Signed)
Cephas Darby, MD [address]  [place]  [place], [place] [address]  Main: [phone]  Fax: [phone] Pager: [phone]  Primary Care Physician:  Letta Median, MD Primary Gastroenterologist:  Dr. Cephas Darby  Pre-Procedure History & Physical: HPI:  Hailey Farrell is a 71 y.o. female is here for an enteroscopy.   Past Medical History:  Diagnosis Date   Anemia    Arthritis    Breast cancer ([place]) 1990   Right   COPD (chronic obstructive pulmonary disease) ([place])    Dyspnea    DOE   Elevated lipids    Hypertension    Lower extremity edema    Migraines    Migraines    Personal history of radiation therapy 1990   Breast   Restless leg syndrome    Rotator cuff injury     Past Surgical History:  Procedure Laterality Date   ABDOMINAL HYSTERECTOMY     BACK SURGERY  08/2017   CERVICAL FUSION   BREAST BIOPSY Right 1990   LUMPECTOMY.  took lymph nodes   BREAST LUMPECTOMY     COLONOSCOPY WITH PROPOFOL N/A 07/20/2017   Procedure: COLONOSCOPY WITH PROPOFOL;  Surgeon: Virgel Manifold, MD;  Location: [facility] ENDOSCOPY;  Service: Endoscopy;  Laterality: N/A;   ENTEROSCOPY N/A 08/08/2017   Procedure: ENTEROSCOPY;  Surgeon: Lin Landsman, MD;  Location: [facility] ENDOSCOPY;  Service: Gastroenterology;  Laterality: N/A;   ENTEROSCOPY N/A 05/07/2020   Procedure: ENTEROSCOPY with Adult colonoscope;  Surgeon: Virgel Manifold, MD;  Location: [facility] ENDOSCOPY;  Service: Endoscopy;  Laterality: N/A;   ENTEROSCOPY N/A 03/17/2021   Procedure: ENTEROSCOPY;  Surgeon: Virgel Manifold, MD;  Location: [facility] ENDOSCOPY;  Service: Endoscopy;  Laterality: N/A;  PUSH   ENTEROSCOPY N/A 07/13/2021   Procedure: ENTEROSCOPY;  Surgeon: Lin Landsman, MD;  Location: [facility] ENDOSCOPY;  Service: Gastroenterology;  Laterality: N/A;   ESOPHAGOGASTRODUODENOSCOPY Left 04/13/2017   Procedure: ESOPHAGOGASTRODUODENOSCOPY (EGD);  Surgeon: Virgel Manifold, MD;  Location: [facility]  ENDOSCOPY;  Service: Endoscopy;  Laterality: Left;   ESOPHAGOGASTRODUODENOSCOPY (EGD) WITH PROPOFOL N/A 07/20/2017   Procedure: ESOPHAGOGASTRODUODENOSCOPY (EGD) WITH PROPOFOL;  Surgeon: Virgel Manifold, MD;  Location: [facility] ENDOSCOPY;  Service: Endoscopy;  Laterality: N/A;   ESOPHAGOGASTRODUODENOSCOPY (EGD) WITH PROPOFOL N/A 12/12/2017   Procedure: ESOPHAGOGASTRODUODENOSCOPY (EGD) WITH PROPOFOL;  Surgeon: Virgel Manifold, MD;  Location: [facility] ENDOSCOPY;  Service: Endoscopy;  Laterality: N/A;   ESOPHAGOGASTRODUODENOSCOPY (EGD) WITH PROPOFOL N/A 12/23/2020   Procedure: ESOPHAGOGASTRODUODENOSCOPY (EGD) WITH PROPOFOL;  Surgeon: Virgel Manifold, MD;  Location: [facility] ENDOSCOPY;  Service: Endoscopy;  Laterality: N/A;   GIVENS CAPSULE STUDY N/A 03/10/2021   Procedure: GIVENS CAPSULE STUDY;  Surgeon: Virgel Manifold, MD;  Location: [facility] ENDOSCOPY;  Service: Endoscopy;  Laterality: N/A;   MEDIAL PARTIAL KNEE REPLACEMENT Left 1990   torn ligament. no knee replacement at that time   PARTIAL HYSTERECTOMY     SHOULDER ARTHROSCOPY WITH OPEN ROTATOR CUFF REPAIR Left 03/01/2016   Procedure: SHOULDER ARTHROSCOPY WITH OPEN ROTATOR CUFF REPAIR;  Surgeon: Thornton Park, MD;  Location: [facility] ORS;  Service: Orthopedics;  Laterality: Left;   TOTAL KNEE ARTHROPLASTY Left 02/14/2018   Procedure: TOTAL KNEE ARTHROPLASTY;  Surgeon: Thornton Park, MD;  Location: [facility] ORS;  Service: Orthopedics;  Laterality: Left;    Prior to Admission medications   Medication Sig Start Date End Date Taking? Authorizing Provider  albuterol (VENTOLIN HFA) 108 (90 Base) MCG/ACT inhaler Inhale 2 puffs into the lungs every 6 (six) hours as  needed for wheezing.   Yes [provider]  amitriptyline (ELAVIL) 25 MG tablet Take 25 mg by mouth at bedtime. 04/13/20  Yes [provider]  budesonide-formoterol (SYMBICORT) 160-4.5 MCG/ACT inhaler Inhale 2 puffs into the lungs 2 (two) times daily.   Yes [provider]  furosemide (LASIX) 20 MG tablet Take 20 mg daily by mouth.    Yes [provider]  lisinopril (PRINIVIL,ZESTRIL) 20 MG tablet Take 20 mg by mouth daily.   Yes [provider]  rOPINIRole (REQUIP) 2 MG tablet Take 2-4 mg by mouth at bedtime as needed (restless leg syndrome).   Yes [provider]  simvastatin (ZOCOR) 20 MG tablet Take 20 mg by mouth daily.    Yes [provider]  acetaminophen (TYLENOL) 650 MG CR tablet Take 650 mg by mouth every 8 (eight) hours as needed for pain.    [provider]  diphenhydramine-acetaminophen (TYLENOL PM) 25-500 MG TABS tablet Take 1-2 tablets by mouth at bedtime as needed (pain).    [provider]  potassium chloride (MICRO-K) 10 MEQ CR capsule Take 10 mEq by mouth daily.    [provider]  tiotropium (SPIRIVA) 18 MCG inhalation capsule Place 1 capsule (18 mcg total) into inhaler and inhale daily. 07/14/21   Sharen Hones, MD  vitamin B-12 (CYANOCOBALAMIN) 1000 MCG tablet Take 1,000 mcg by mouth daily.    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  Tobacco Use   Smoking status: Some Days    Packs/day: 0.25    Years: 50.00    Pack years: 12.50    Types: Cigarettes    Last attempt to quit: 03/13/2016    Years since quitting: 5.6   Smokeless tobacco: Never  Vaping Use   Vaping Use: Never used  Substance and Sexual Activity   Alcohol use: No   Drug use: No   Sexual activity: Not on file  Other Topics  Concern   Not on file  Social History Narrative   Not on file   Social Determinants of Health   Financial Resource Strain: Not on file  Food Insecurity: Not on file  Transportation Needs: Not on file  Physical Activity: Not on file  Stress: Not on file  Social Connections: Not on file  Intimate Partner Violence: Not on file    Review of Systems: See HPI, otherwise negative ROS  Physical Exam: BP 135/73   Pulse 86   Temp (!) 97.3 F (36.3 C) (Temporal)   Resp 16   Ht '5\' 3"'$  (1.6 m)   Wt 83.9 kg   SpO2 100%   BMI 32.77 kg/m  General:   Alert,  pleasant and cooperative in NAD Head:  Normocephalic and atraumatic. Neck:  Supple; no masses or thyromegaly. Lungs:  Clear throughout to auscultation.    Heart:  Regular rate and rhythm. Abdomen:  Soft, nontender and nondistended. Normal bowel  sounds, without guarding, and without rebound.   Neurologic:  Alert and  oriented x4;  grossly normal neurologically.  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  Questions have been answered.  All parties agreeable.   Sherri Sear, MD  10/26/2021, 10:45 AM

## 2021-10-26 NOTE — Op Note (Signed)
[facility] Gastroenterology Patient Name: Hailey Farrell Procedure Date: 10/26/2021 10:46 AM MRN: [id number] Account #: [id number] Date of Birth: [birth year] Admit Type: Outpatient Age: 71 Room: [facility] ENDO ROOM 1 Gender: Female Note Status: Finalized Instrument Name: Peds Colonoscope [id number] Procedure:             Small bowel enteroscopy Indications:           Obscure gastrointestinal bleeding, Arteriovenous                         malformation in the small intestine Providers:             Lin Landsman MD, MD Referring MD:          Baxter Kail. Rebeca Alert MD, MD (Referring MD) Medicines:             General Anesthesia Complications:         No immediate complications. Estimated blood loss: None. Procedure:             Pre-Anesthesia Assessment:                        - Prior to the procedure, a History and Physical was                         performed, and patient medications and allergies were                         reviewed. The patient is competent. The risks and                         benefits of the procedure and the sedation options and                         risks were discussed with the patient. All questions                         were answered and informed consent was obtained.                         Patient identification and proposed procedure were                         verified by the physician, the nurse, the                         anesthesiologist, the anesthetist and the technician                         in the pre-procedure area in the procedure room in the                         endoscopy suite. Mental Status Examination: alert and                         oriented. Airway Examination: normal oropharyngeal                         airway and neck mobility. Respiratory Examination:  clear to auscultation. CV Examination: normal.                         Prophylactic Antibiotics: The patient does not require                          prophylactic antibiotics. Prior Anticoagulants: The                         patient has taken no previous anticoagulant or                         antiplatelet agents. ASA Grade Assessment: III - A                         patient with severe systemic disease. After reviewing                         the risks and benefits, the patient was deemed in                         satisfactory condition to undergo the procedure. The                         anesthesia plan was to use general anesthesia.                         Immediately prior to administration of medications,                         the patient was re-assessed for adequacy to receive                         sedatives. The heart rate, respiratory rate, oxygen                         saturations, blood pressure, adequacy of pulmonary                         ventilation, and response to care were monitored                         throughout the procedure. The physical status of the                         patient was re-assessed after the procedure.                        After obtaining informed consent, the endoscope was                         passed under direct vision. Throughout the procedure,                         the patient's blood pressure, pulse, and oxygen                         saturations were monitored continuously. The  Colonoscope was introduced through the mouth and                         advanced to the distal jejunum. The small bowel                         enteroscopy was accomplished without difficulty. The                         patient tolerated the procedure well. Findings:      There was no evidence of significant pathology in the proximal jejunum,       in the mid-jejunum and in the distal jejunum. Area was tattooed with an       injection of Spot (carbon black).      There was no evidence of significant pathology in the entire examined       duodenum.      The esophagus  was normal.      The stomach was normal.      A medium-sized hiatal hernia was present. Impression:            - The examined portion of the jejunum was normal.                         Tattooed.                        - Normal examined duodenum.                        - Normal esophagus.                        - Normal stomach.                        - Medium-sized hiatal hernia.                        - No specimens collected. Recommendation:        - Discharge patient to home (with escort).                        - Resume previous diet today.                        - Perform a colonoscopy at appointment to be scheduled. Procedure Code(s):     --- Professional ---                        [phone], Small intestinal endoscopy, enteroscopy beyond                         second portion of duodenum, not including ileum;                         diagnostic, including collection of specimen(s) by                         brushing or washing, when performed (separate  procedure)                        [phone], Unlisted procedure, small intestine Diagnosis Code(s):     --- Professional ---                        K44.9, Diaphragmatic hernia without obstruction or                         gangrene                        K92.2, Gastrointestinal hemorrhage, unspecified                        K31.819, Angiodysplasia of stomach and duodenum                         without bleeding CPT copyright 2019 [employer]. All rights reserved. The codes documented in this report are preliminary and upon coder review may  be revised to meet current compliance requirements. Dr. Ulyess Mort Lin Landsman MD, MD 10/26/2021 11:25:50 AM This report has been signed electronically. Number of Addenda: 0 Note Initiated On: 10/26/2021 10:46 AM Estimated Blood Loss:  Estimated blood loss: none.      [facility]

## 2021-10-26 NOTE — Transfer of Care (Signed)
Immediate Anesthesia Transfer of Care Note  Patient: Hailey Farrell  Procedure(s) Performed: ENTEROSCOPY  Patient Location: PACU  Anesthesia Type:General  Level of Consciousness: awake, alert  and oriented  Airway & Oxygen Therapy: Patient Spontanous Breathing  Post-op Assessment: Report given to RN and Post -op Vital signs reviewed and stable  Post vital signs: Reviewed and stable  Last Vitals:  Vitals Value Taken Time  BP    Temp    Pulse    Resp    SpO2      Last Pain:  Vitals:   10/26/21 1125  TempSrc:   PainSc: 0-No pain         Complications: No notable events documented.

## 2021-10-27 ENCOUNTER — Telehealth: Payer: Self-pay

## 2021-10-27 NOTE — Telephone Encounter (Signed)
On Small bowel Enteroscopy it said to scheduled colonoscopy. Please inform me of the diagnoses and I thought you said she needed a EGD also. Please let me know what diagnoses for EGD also if she needs this

## 2021-10-27 NOTE — Telephone Encounter (Signed)
Called and left a message for call back  

## 2021-10-28 ENCOUNTER — Inpatient Hospital Stay: Payer: Medicare Other

## 2021-10-28 VITALS — BP 121/65 | HR 103 | Temp 96.6°F | Resp 18

## 2021-10-28 DIAGNOSIS — K279 Peptic ulcer, site unspecified, unspecified as acute or chronic, without hemorrhage or perforation: Secondary | ICD-10-CM

## 2021-10-28 DIAGNOSIS — D509 Iron deficiency anemia, unspecified: Secondary | ICD-10-CM

## 2021-10-28 MED ORDER — OCTREOTIDE ACETATE 10 MG IM KIT
10.0000 mg | PACK | Freq: Once | INTRAMUSCULAR | Status: AC
  Administered 2021-10-28: 10 mg via INTRAMUSCULAR
  Filled 2021-10-28: qty 1

## 2021-10-28 MED ORDER — IRON SUCROSE 20 MG/ML IV SOLN
200.0000 mg | Freq: Once | INTRAVENOUS | Status: AC
  Administered 2021-10-28: 200 mg via INTRAVENOUS
  Filled 2021-10-28: qty 10

## 2021-10-28 MED ORDER — SODIUM CHLORIDE 0.9 % IV SOLN
INTRAVENOUS | Status: DC
  Filled 2021-10-28: qty 250

## 2021-10-28 MED ORDER — SODIUM CHLORIDE 0.9 % IV SOLN: 200.0000 mg | Freq: Once | INTRAVENOUS | Status: DC

## 2021-10-28 MED ORDER — OCTREOTIDE ACETATE 20 MG IM KIT
10.0000 mg | PACK | Freq: Once | INTRAMUSCULAR | Status: DC
  Filled 2021-10-28: qty 1

## 2021-10-28 NOTE — Telephone Encounter (Signed)
Called and left a message for call back  

## 2021-11-01 NOTE — Telephone Encounter (Signed)
Pt left message returning call in ref to colonoscopy ?

## 2021-11-01 NOTE — Telephone Encounter (Signed)
Called and left a message for call back  

## 2021-11-01 NOTE — Telephone Encounter (Signed)
Mailed letter to patient

## 2021-11-02 NOTE — Telephone Encounter (Signed)
Patient left a voicemail and I called patient back and left a message for call back

## 2021-11-02 NOTE — Telephone Encounter (Signed)
Called and left a message for call back  

## 2021-11-07 NOTE — Progress Notes (Unsigned)
[facility]  [phone] Fax:(336) [phone]  ID: Hailey Farrell OB: 1951/04/12  MR#: [id number]  QQI#:[id number]  Patient Care Team: Letta Median, MD as PCP - General (Family Medicine)  CHIEF COMPLAINT: Iron deficiency anemia  INTERVAL HISTORY: Patient returns to clinic today for repeat laboratory work, further evaluation, and consideration of additional IV Venofer.  She currently feels well and is asymptomatic.  She does not complain of any weakness or fatigue.  She has no neurologic complaints.  She denies any recent fevers or illnesses.  She has a good appetite and denies weight loss.    She denies any chest pain, shortness of breath, cough, or hemoptysis.  She denies any nausea, vomiting, constipation, or diarrhea. She has no further melena or hematochezia.  She has no urinary complaints.  Patient offers no specific complaints today.  REVIEW OF SYSTEMS:   Review of Systems  Constitutional: Negative.  Negative for fever, malaise/fatigue and weight loss.  Respiratory: Negative.  Negative for cough and shortness of breath.   Cardiovascular: Negative.  Negative for chest pain and leg swelling.  Gastrointestinal: Negative.  Negative for abdominal pain, blood in stool and melena.  Genitourinary: Negative.  Negative for hematuria.  Musculoskeletal: Negative.  Negative for back pain.  Skin: Negative.  Negative for rash.  Neurological: Negative.  Negative for dizziness, focal weakness, weakness and headaches.  Psychiatric/Behavioral: Negative.  The patient is not nervous/anxious.    As per HPI. Otherwise, a complete review of systems is negative.  PAST MEDICAL HISTORY: Past Medical History:  Diagnosis Date   Anemia    Arthritis    Breast cancer ([place]) 1990   Right   COPD (chronic obstructive pulmonary disease) ([place])    Dyspnea    DOE   Elevated lipids    Hypertension    Lower extremity edema    Migraines    Migraines    Personal history of  radiation therapy 1990   Breast   Restless leg syndrome    Rotator cuff injury     PAST SURGICAL HISTORY: Past Surgical History:  Procedure Laterality Date   ABDOMINAL HYSTERECTOMY     BACK SURGERY  08/2017   CERVICAL FUSION   BREAST BIOPSY Right 1990   LUMPECTOMY.  took lymph nodes   BREAST LUMPECTOMY     COLONOSCOPY WITH PROPOFOL N/A 07/20/2017   Procedure: COLONOSCOPY WITH PROPOFOL;  Surgeon: Virgel Manifold, MD;  Location: [facility] ENDOSCOPY;  Service: Endoscopy;  Laterality: N/A;   ENTEROSCOPY N/A 08/08/2017   Procedure: ENTEROSCOPY;  Surgeon: Lin Landsman, MD;  Location: [facility] ENDOSCOPY;  Service: Gastroenterology;  Laterality: N/A;   ENTEROSCOPY N/A 05/07/2020   Procedure: ENTEROSCOPY with Adult colonoscope;  Surgeon: Virgel Manifold, MD;  Location: [facility] ENDOSCOPY;  Service: Endoscopy;  Laterality: N/A;   ENTEROSCOPY N/A 03/17/2021   Procedure: ENTEROSCOPY;  Surgeon: Virgel Manifold, MD;  Location: [facility] ENDOSCOPY;  Service: Endoscopy;  Laterality: N/A;  PUSH   ENTEROSCOPY N/A 07/13/2021   Procedure: ENTEROSCOPY;  Surgeon: Lin Landsman, MD;  Location: [facility] ENDOSCOPY;  Service: Gastroenterology;  Laterality: N/A;   ENTEROSCOPY N/A 10/26/2021   Procedure: ENTEROSCOPY;  Surgeon: Lin Landsman, MD;  Location: [facility] ENDOSCOPY;  Service: Gastroenterology;  Laterality: N/A;  push   ESOPHAGOGASTRODUODENOSCOPY Left 04/13/2017   Procedure: ESOPHAGOGASTRODUODENOSCOPY (EGD);  Surgeon: Virgel Manifold, MD;  Location: [facility] ENDOSCOPY;  Service: Endoscopy;  Laterality: Left;   ESOPHAGOGASTRODUODENOSCOPY (EGD) WITH PROPOFOL N/A 07/20/2017   Procedure: ESOPHAGOGASTRODUODENOSCOPY (EGD) WITH PROPOFOL;  Surgeon: Virgel Manifold, MD;  Location: [facility] ENDOSCOPY;  Service: Endoscopy;  Laterality: N/A;   ESOPHAGOGASTRODUODENOSCOPY (EGD) WITH PROPOFOL N/A 12/12/2017   Procedure: ESOPHAGOGASTRODUODENOSCOPY (EGD) WITH PROPOFOL;  Surgeon: Virgel Manifold, MD;   Location: [facility] ENDOSCOPY;  Service: Endoscopy;  Laterality: N/A;   ESOPHAGOGASTRODUODENOSCOPY (EGD) WITH PROPOFOL N/A 12/23/2020   Procedure: ESOPHAGOGASTRODUODENOSCOPY (EGD) WITH PROPOFOL;  Surgeon: Virgel Manifold, MD;  Location: [facility] ENDOSCOPY;  Service: Endoscopy;  Laterality: N/A;   GIVENS CAPSULE STUDY N/A 03/10/2021   Procedure: GIVENS CAPSULE STUDY;  Surgeon: Virgel Manifold, MD;  Location: [facility] ENDOSCOPY;  Service: Endoscopy;  Laterality: N/A;   MEDIAL PARTIAL KNEE REPLACEMENT Left 1990   torn ligament. no knee replacement at that time   PARTIAL HYSTERECTOMY     SHOULDER ARTHROSCOPY WITH OPEN ROTATOR CUFF REPAIR Left 03/01/2016   Procedure: SHOULDER ARTHROSCOPY WITH OPEN ROTATOR CUFF REPAIR;  Surgeon: Thornton Park, MD;  Location: [facility] ORS;  Service: Orthopedics;  Laterality: Left;   TOTAL KNEE ARTHROPLASTY Left 02/14/2018   Procedure: TOTAL KNEE ARTHROPLASTY;  Surgeon: Thornton Park, MD;  Location: [facility] ORS;  Service: Orthopedics;  Laterality: Left;    FAMILY HISTORY: Family History  Problem Relation Age of Onset   CAD Mother    CAD Sister    Breast cancer Neg Hx     ADVANCED DIRECTIVES (Y/N):  N  HEALTH MAINTENANCE: Social History   Tobacco Use   Smoking status: Some Days    Packs/day: 0.25    Years: 50.00    Pack years: 12.50    Types: Cigarettes    Last attempt to quit: 03/13/2016    Years since quitting: 5.6   Smokeless tobacco: Never  Vaping Use   Vaping Use: Never used  Substance Use Topics   Alcohol use: No   Drug use: No     Colonoscopy:  PAP:  Bone density:  Lipid panel:  Allergies  Allergen Reactions   Latex Rash    Welts over body    Current Outpatient Medications  Medication Sig Dispense Refill   acetaminophen (TYLENOL) 650 MG CR tablet Take 650 mg by mouth every 8 (eight) hours as needed for pain.     albuterol (VENTOLIN HFA) 108 (90 Base) MCG/ACT inhaler Inhale 2 puffs into the lungs every 6 (six) hours as needed for  wheezing.     amitriptyline (ELAVIL) 25 MG tablet Take 25 mg by mouth at bedtime.     budesonide-formoterol (SYMBICORT) 160-4.5 MCG/ACT inhaler Inhale 2 puffs into the lungs 2 (two) times daily.     diphenhydramine-acetaminophen (TYLENOL PM) 25-500 MG TABS tablet Take 1-2 tablets by mouth at bedtime as needed (pain).     furosemide (LASIX) 20 MG tablet Take 20 mg daily by mouth.      lisinopril (PRINIVIL,ZESTRIL) 20 MG tablet Take 20 mg by mouth daily.     potassium chloride (MICRO-K) 10 MEQ CR capsule Take 10 mEq by mouth daily.     rOPINIRole (REQUIP) 2 MG tablet Take 2-4 mg by mouth at bedtime as needed (restless leg syndrome).     simvastatin (ZOCOR) 20 MG tablet Take 20 mg by mouth daily.      tiotropium (SPIRIVA) 18 MCG inhalation capsule Place 1 capsule (18 mcg total) into inhaler and inhale daily. 30 capsule 0   vitamin B-12 (CYANOCOBALAMIN) 1000 MCG tablet Take 1,000 mcg by mouth daily.     No current facility-administered medications for this visit.   Facility-Administered Medications Ordered in Other Visits  Medication Dose  Route Frequency Provider Last Rate Last Admin   0.9 %  sodium chloride infusion   Intravenous Continuous Sindy Guadeloupe, MD 20 mL/hr at 10/26/21 1058 Continued from Pre-op at 10/26/21 1058    OBJECTIVE: Vitals:   11/08/21 1342  BP: (!) 157/81  Pulse: 80  Resp: 18  Temp: 97.7 F (36.5 C)  SpO2: 99%     Body mass index is 32.68 kg/m.    ECOG FS:0 - Asymptomatic  General: Well-developed, well-nourished, no acute distress. Eyes: Pink conjunctiva, anicteric sclera. HEENT: Normocephalic, moist mucous membranes. Lungs: No audible wheezing or coughing. Heart: Regular rate and rhythm. Abdomen: Soft, nontender, no obvious distention. Musculoskeletal: No edema, cyanosis, or clubbing. Neuro: Alert, answering all questions appropriately. Cranial nerves grossly intact. Skin: No rashes or petechiae noted. Psych: Normal affect.  LAB RESULTS:  Lab Results   Component Value Date   NA 137 07/13/2021   K 3.8 07/13/2021   CL 105 07/13/2021   CO2 27 07/13/2021   GLUCOSE 90 07/13/2021   BUN 18 07/13/2021   CREATININE 0.93 07/13/2021   CALCIUM 8.5 (L) 07/13/2021   PROT 6.1 (L) 07/12/2021   ALBUMIN 3.7 07/12/2021   AST 16 07/12/2021   ALT 12 07/12/2021   ALKPHOS 32 (L) 07/12/2021   BILITOT 0.3 07/12/2021   GFRNONAA >60 07/13/2021   GFRAA >60 02/16/2018    Lab Results  Component Value Date   WBC 4.6 11/08/2021   NEUTROABS 2.7 11/08/2021   HGB 10.0 (L) 11/08/2021   HCT 32.8 (L) 11/08/2021   MCV 81.6 11/08/2021   PLT 318 11/08/2021   Lab Results  Component Value Date   IRON 21 (L) 10/26/2021   TIBC 395 10/26/2021   IRONPCTSAT 5 (L) 10/26/2021   Lab Results  Component Value Date   FERRITIN 63 10/26/2021     STUDIES: No results found.   ASSESSMENT: Iron deficiency anemia.  PLAN:    1. 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  Argon plasma was used for hemostasis.  Patient received 4 doses of IV Venofer recently, with her last infusion occurring on Oct 28, 2021.  Hemoglobin has improved to 10.0.  We discussed receiving additional Venofer today, but patient has declined.  No intervention is needed at this time.  Return to clinic in 3 months with repeat laboratory work, further evaluation, and continuation of treatment if needed.    I spent a total of 20 minutes reviewing chart data, face-to-face evaluation with the patient, counseling and coordination of care as detailed above.  Patient expressed understanding and was in agreement with this plan. She also understands that She can call clinic at any time with any questions, concerns, or complaints.    Lloyd Huger, MD   11/08/2021 2:56 PM

## 2021-11-08 ENCOUNTER — Inpatient Hospital Stay: Payer: Medicare Other | Attending: Oncology

## 2021-11-08 ENCOUNTER — Inpatient Hospital Stay: Payer: Medicare Other

## 2021-11-08 ENCOUNTER — Inpatient Hospital Stay (HOSPITAL_BASED_OUTPATIENT_CLINIC_OR_DEPARTMENT_OTHER): Payer: Medicare Other | Admitting: Oncology

## 2021-11-08 ENCOUNTER — Encounter: Payer: Self-pay | Admitting: Oncology

## 2021-11-08 VITALS — BP 157/81 | HR 80 | Temp 97.7°F | Resp 18 | Wt 184.5 lb

## 2021-11-08 DIAGNOSIS — Z720 Tobacco use: Secondary | ICD-10-CM | POA: Insufficient documentation

## 2021-11-08 DIAGNOSIS — D509 Iron deficiency anemia, unspecified: Secondary | ICD-10-CM

## 2021-11-08 LAB — CBC WITH DIFFERENTIAL/PLATELET
Abs Immature Granulocytes: 0.01 10*3/uL (ref 0.00–0.07)
Basophils Absolute: 0 10*3/uL (ref 0.0–0.1)
Basophils Relative: 1 %
Eosinophils Absolute: 0 10*3/uL (ref 0.0–0.5)
Eosinophils Relative: 1 %
HCT: 32.8 % — ABNORMAL LOW (ref 36.0–46.0)
Hemoglobin: 10 g/dL — ABNORMAL LOW (ref 12.0–15.0)
Immature Granulocytes: 0 %
Lymphocytes Relative: 31 %
Lymphs Abs: 1.4 10*3/uL (ref 0.7–4.0)
MCH: 24.9 pg — ABNORMAL LOW (ref 26.0–34.0)
MCHC: 30.5 g/dL (ref 30.0–36.0)
MCV: 81.6 fL (ref 80.0–100.0)
Monocytes Absolute: 0.4 10*3/uL (ref 0.1–1.0)
Monocytes Relative: 9 %
Neutro Abs: 2.7 10*3/uL (ref 1.7–7.7)
Neutrophils Relative %: 58 %
Platelets: 318 10*3/uL (ref 150–400)
RBC: 4.02 MIL/uL (ref 3.87–5.11)
RDW: 21.4 % — ABNORMAL HIGH (ref 11.5–15.5)
WBC: 4.6 10*3/uL (ref 4.0–10.5)
nRBC: 0 % (ref 0.0–0.2)

## 2021-11-08 LAB — FERRITIN: Ferritin: 51 ng/mL (ref 11–307)

## 2021-11-08 LAB — SAMPLE TO BLOOD BANK

## 2021-11-08 LAB — IRON AND TIBC
Iron: 43 ug/dL (ref 28–170)
Saturation Ratios: 10 % — ABNORMAL LOW (ref 10.4–31.8)
TIBC: 413 ug/dL (ref 250–450)
UIBC: 370 ug/dL

## 2021-11-21 DIAGNOSIS — M19019 Primary osteoarthritis, unspecified shoulder: Secondary | ICD-10-CM | POA: Insufficient documentation

## 2021-11-23 ENCOUNTER — Other Ambulatory Visit: Payer: Self-pay | Admitting: Family Medicine

## 2021-11-23 DIAGNOSIS — Z1231 Encounter for screening mammogram for malignant neoplasm of breast: Secondary | ICD-10-CM

## 2021-11-24 ENCOUNTER — Telehealth: Payer: Self-pay

## 2021-11-24 NOTE — Telephone Encounter (Signed)
Pt contacted office to schedule her colonoscopy.  She stated that she received a letter from Dr. Marius Ditch.  After providing patient with the date-I reviewed chart with Caryl Pina, Dr. Verlin Grills CMA.  Based on our chart review patient is not due for a colonoscopy.  Her colonoscopy is due February 15th, 2024. Left message for her to call me back to convey.  Thanks, [place], [place]

## 2021-12-12 IMAGING — MG MM DIGITAL SCREENING BILAT W/ TOMO AND CAD
8 series · 8 of 24 positions shown · non-contrast
Comparison: Previous exam(s).

CLINICAL DATA: Screening.

EXAM:
DIGITAL SCREENING BILATERAL MAMMOGRAM WITH TOMOSYNTHESIS AND CAD
TECHNIQUE: Bilateral screening digital craniocaudal and mediolateral oblique
mammograms were obtained. Bilateral screening digital breast
tomosynthesis was performed. The images were evaluated with
computer-aided detection.

[R MLO synth-2D]
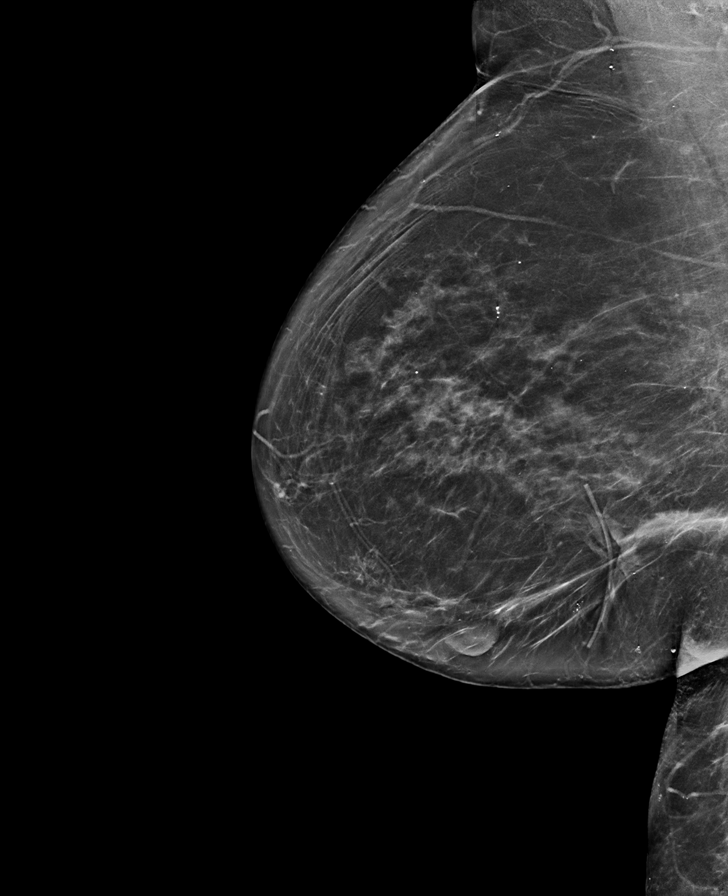

[L MLO synth-2D]
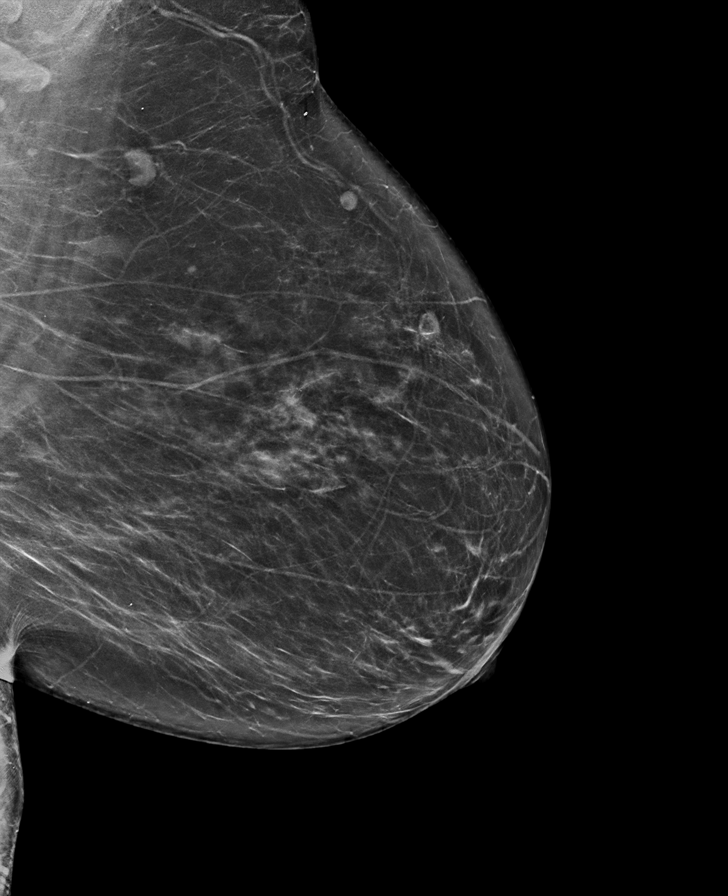

[R CC synth-2D]
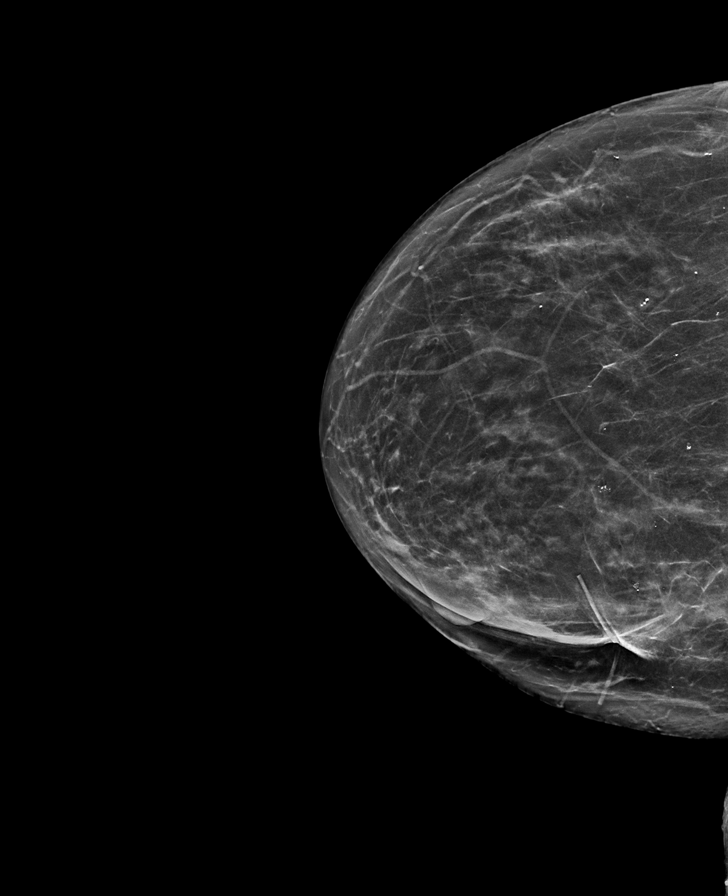

[L CC synth-2D]
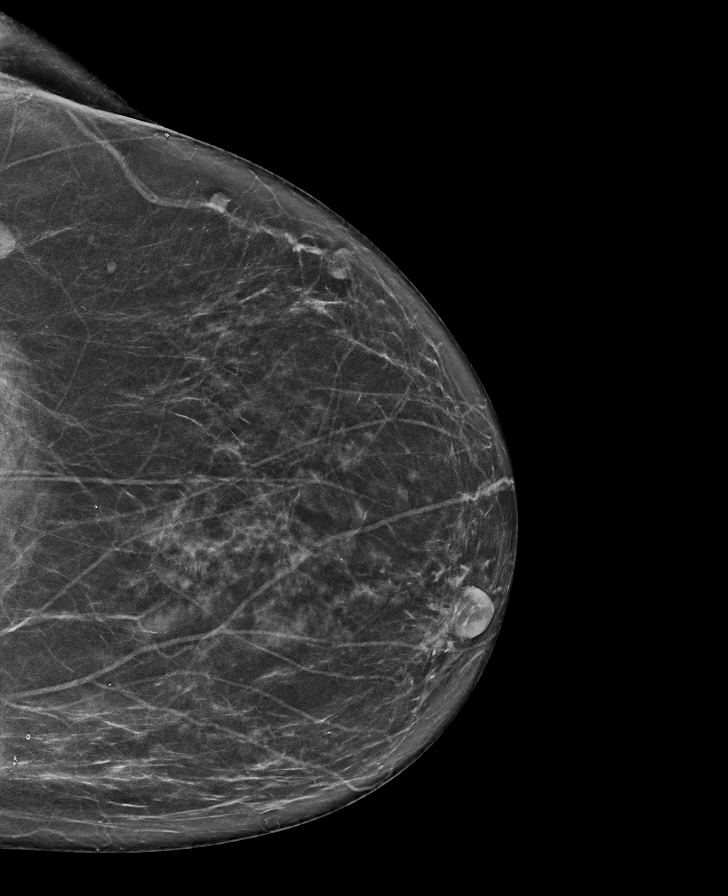

[R MLO tomo · tomo slice 39/77.0]
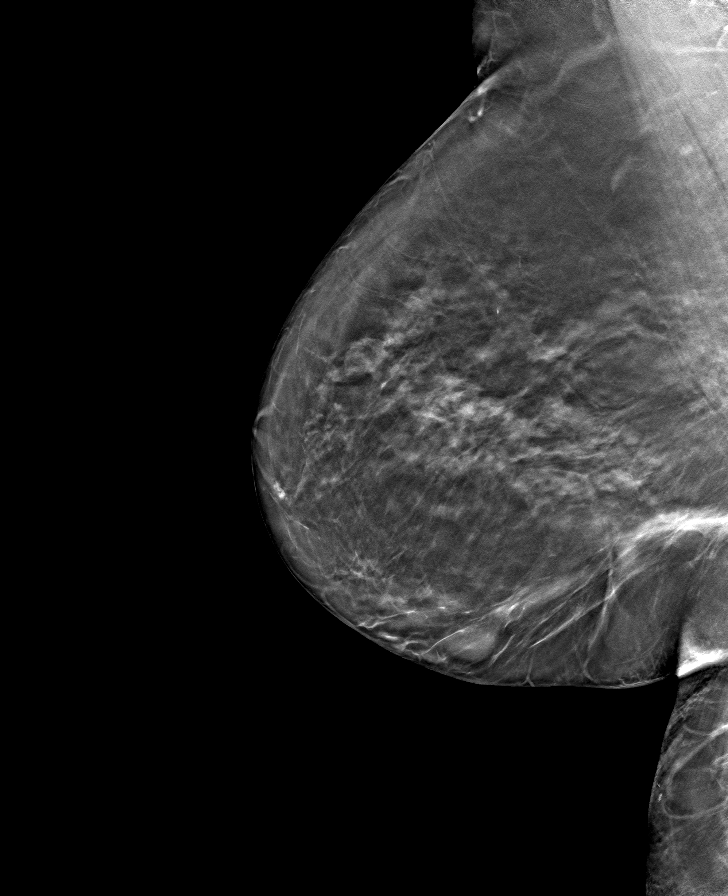

[L CC tomo · tomo slice 33/65.0]
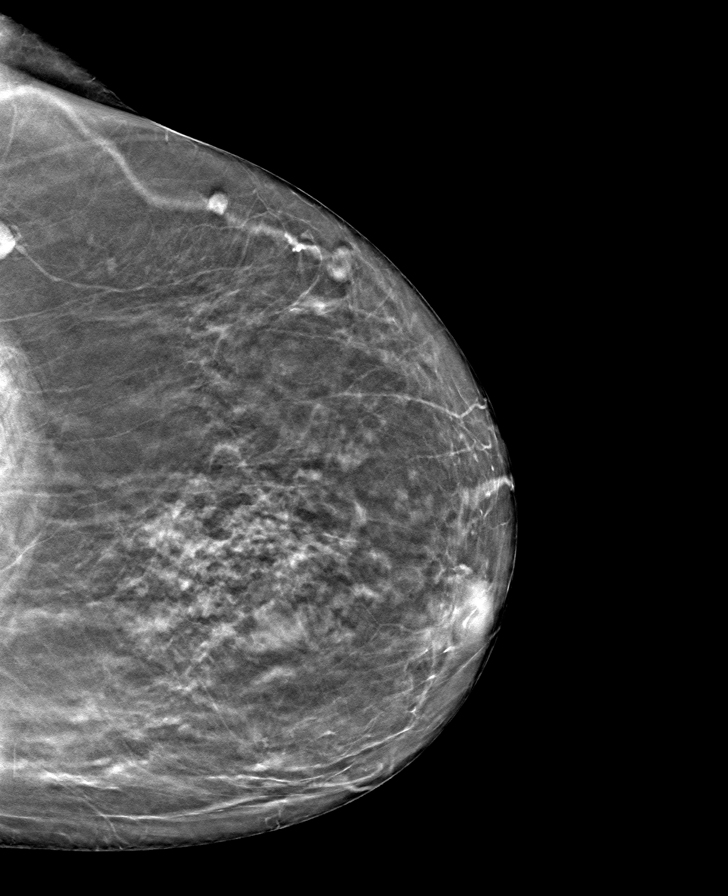

[L MLO tomo · tomo slice 39/78.0]
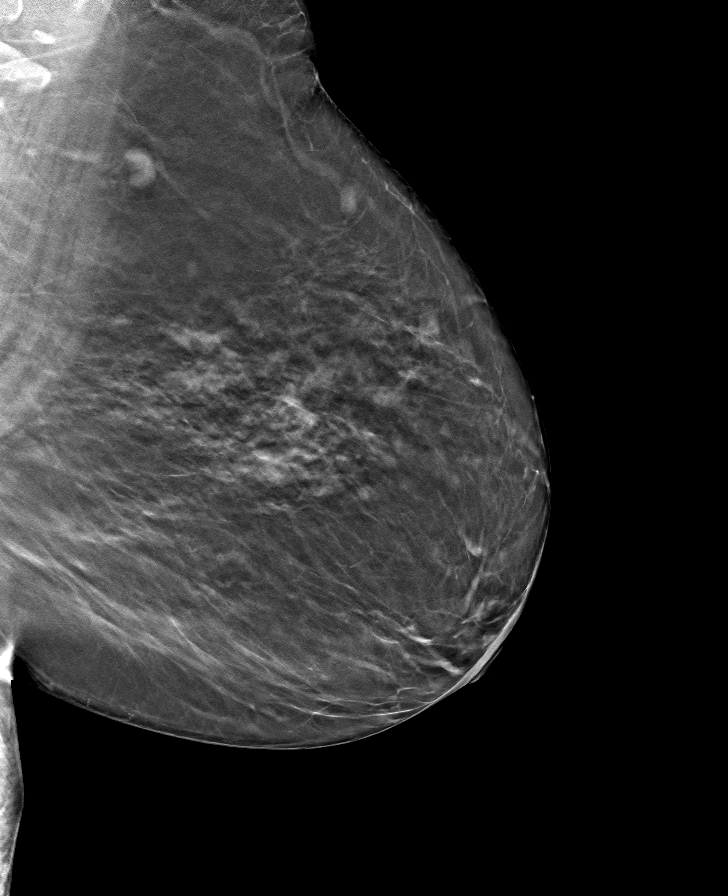

[R CC tomo · tomo slice 33/65.0]
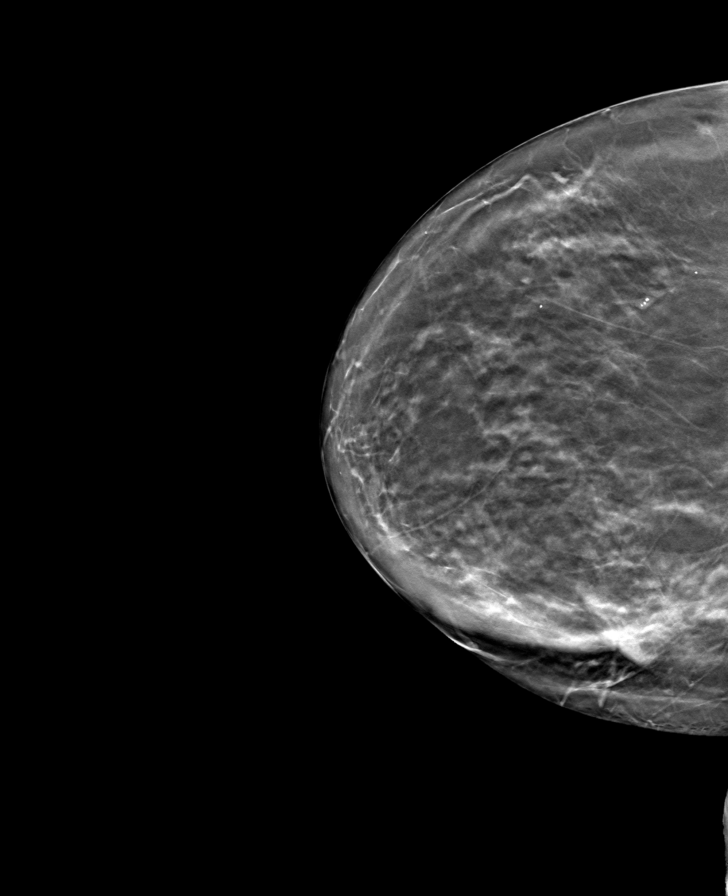

[8 of 24 positions shown; findings below may reference images not displayed]

ACR Breast Density Category b: There are scattered areas of
fibroglandular density.
FINDINGS: There are no findings suspicious for malignancy.
IMPRESSION: No mammographic evidence of malignancy. A result letter of this
screening mammogram will be mailed directly to the patient.

RECOMMENDATION:
Screening mammogram in one year. (Code:51-O-LD2)

BI-RADS CATEGORY  1: Negative.

## 2021-12-14 ENCOUNTER — Telehealth: Payer: Self-pay

## 2021-12-14 NOTE — Telephone Encounter (Signed)
I called pt 8 times between 4:38pm and 4:45pm, no answer and lmovm 1 time

## 2021-12-14 NOTE — Telephone Encounter (Signed)
Returned patients call in regards to why she did not receive colonoscopy instructions.  I LVM for her on 11/24/21  to let her know her colonoscopy is not due until Feb 15th 2024-asked her to call me back at that time and she did not.  Left voice message today informing her of this as well.  Asked her to call me back to confirm she got this message.  Thanks,  [place], [place]

## 2021-12-30 ENCOUNTER — Ambulatory Visit
Admission: RE | Admit: 2021-12-30 | Discharge: 2021-12-30 | Disposition: A | Discharge status: Home / Self Care | Payer: Medicare Other | Source: Ambulatory Visit | Attending: Family Medicine | Admitting: Family Medicine

## 2021-12-30 DIAGNOSIS — Z1231 Encounter for screening mammogram for malignant neoplasm of breast: Secondary | ICD-10-CM | POA: Insufficient documentation

## 2022-01-25 ENCOUNTER — Other Ambulatory Visit: Payer: Self-pay

## 2022-01-25 ENCOUNTER — Inpatient Hospital Stay
Admission: EM | Admit: 2022-01-25 | Discharge: 2022-01-28 | DRG: 378 | Disposition: A | Discharge status: Home / Self Care | Payer: Medicare Other | Attending: Internal Medicine | Admitting: Internal Medicine

## 2022-01-25 DIAGNOSIS — I708 Atherosclerosis of other arteries: Secondary | ICD-10-CM | POA: Diagnosis present

## 2022-01-25 DIAGNOSIS — F17210 Nicotine dependence, cigarettes, uncomplicated: Secondary | ICD-10-CM | POA: Diagnosis present

## 2022-01-25 DIAGNOSIS — J449 Chronic obstructive pulmonary disease, unspecified: Secondary | ICD-10-CM | POA: Diagnosis present

## 2022-01-25 DIAGNOSIS — Z853 Personal history of malignant neoplasm of breast: Secondary | ICD-10-CM

## 2022-01-25 DIAGNOSIS — Z981 Arthrodesis status: Secondary | ICD-10-CM

## 2022-01-25 DIAGNOSIS — K449 Diaphragmatic hernia without obstruction or gangrene: Secondary | ICD-10-CM | POA: Diagnosis present

## 2022-01-25 DIAGNOSIS — Z91040 Latex allergy status: Secondary | ICD-10-CM

## 2022-01-25 DIAGNOSIS — R590 Localized enlarged lymph nodes: Secondary | ICD-10-CM | POA: Diagnosis present

## 2022-01-25 DIAGNOSIS — Z79899 Other long term (current) drug therapy: Secondary | ICD-10-CM

## 2022-01-25 DIAGNOSIS — G2581 Restless legs syndrome: Secondary | ICD-10-CM | POA: Diagnosis present

## 2022-01-25 DIAGNOSIS — K922 Gastrointestinal hemorrhage, unspecified: Secondary | ICD-10-CM | POA: Diagnosis not present

## 2022-01-25 DIAGNOSIS — I10 Essential (primary) hypertension: Secondary | ICD-10-CM | POA: Diagnosis present

## 2022-01-25 DIAGNOSIS — R110 Nausea: Secondary | ICD-10-CM | POA: Diagnosis present

## 2022-01-25 DIAGNOSIS — Z8249 Family history of ischemic heart disease and other diseases of the circulatory system: Secondary | ICD-10-CM

## 2022-01-25 DIAGNOSIS — R531 Weakness: Secondary | ICD-10-CM | POA: Diagnosis present

## 2022-01-25 DIAGNOSIS — E876 Hypokalemia: Secondary | ICD-10-CM | POA: Diagnosis present

## 2022-01-25 DIAGNOSIS — D62 Acute posthemorrhagic anemia: Secondary | ICD-10-CM | POA: Diagnosis present

## 2022-01-25 DIAGNOSIS — K5521 Angiodysplasia of colon with hemorrhage: Principal | ICD-10-CM | POA: Diagnosis present

## 2022-01-25 DIAGNOSIS — R1033 Periumbilical pain: Secondary | ICD-10-CM | POA: Diagnosis present

## 2022-01-25 DIAGNOSIS — Z7951 Long term (current) use of inhaled steroids: Secondary | ICD-10-CM

## 2022-01-25 DIAGNOSIS — Z96652 Presence of left artificial knee joint: Secondary | ICD-10-CM | POA: Diagnosis present

## 2022-01-25 DIAGNOSIS — M1990 Unspecified osteoarthritis, unspecified site: Secondary | ICD-10-CM | POA: Diagnosis present

## 2022-01-25 DIAGNOSIS — Z90711 Acquired absence of uterus with remaining cervical stump: Secondary | ICD-10-CM

## 2022-01-25 DIAGNOSIS — R718 Other abnormality of red blood cells: Secondary | ICD-10-CM | POA: Diagnosis present

## 2022-01-25 DIAGNOSIS — M899 Disorder of bone, unspecified: Secondary | ICD-10-CM | POA: Diagnosis present

## 2022-01-25 DIAGNOSIS — D649 Anemia, unspecified: Secondary | ICD-10-CM

## 2022-01-25 DIAGNOSIS — E785 Hyperlipidemia, unspecified: Secondary | ICD-10-CM | POA: Diagnosis present

## 2022-01-25 DIAGNOSIS — Z8719 Personal history of other diseases of the digestive system: Secondary | ICD-10-CM

## 2022-01-25 DIAGNOSIS — Z923 Personal history of irradiation: Secondary | ICD-10-CM

## 2022-01-25 LAB — COMPREHENSIVE METABOLIC PANEL
ALT: 13 U/L (ref 0–44)
AST: 15 U/L (ref 15–41)
Albumin: 3.8 g/dL (ref 3.5–5.0)
Alkaline Phosphatase: 30 U/L — ABNORMAL LOW (ref 38–126)
Anion gap: 7 (ref 5–15)
BUN: 21 mg/dL (ref 8–23)
CO2: 25 mmol/L (ref 22–32)
Calcium: 8.9 mg/dL (ref 8.9–10.3)
Chloride: 108 mmol/L (ref 98–111)
Creatinine, Ser: 0.97 mg/dL (ref 0.44–1.00)
GFR, Estimated: >60 mL/min (ref >60)
Glucose, Bld: 110 mg/dL — ABNORMAL HIGH (ref 70–99)
Potassium: 3.3 mmol/L — ABNORMAL LOW (ref 3.5–5.1)
Sodium: 140 mmol/L (ref 135–145)
Total Bilirubin: 0.5 mg/dL (ref 0.3–1.2)
Total Protein: 6.2 g/dL — ABNORMAL LOW (ref 6.5–8.1)

## 2022-01-25 LAB — CBC WITH DIFFERENTIAL/PLATELET
Abs Immature Granulocytes: 0.02 10*3/uL (ref 0.00–0.07)
Basophils Absolute: 0 10*3/uL (ref 0.0–0.1)
Basophils Relative: 0 %
Eosinophils Absolute: 0 10*3/uL (ref 0.0–0.5)
Eosinophils Relative: 0 %
HCT: 17 % — ABNORMAL LOW (ref 36.0–46.0)
Hemoglobin: 5.1 g/dL — ABNORMAL LOW (ref 12.0–15.0)
Immature Granulocytes: 0 %
Lymphocytes Relative: 14 %
Lymphs Abs: 0.9 10*3/uL (ref 0.7–4.0)
MCH: 22.1 pg — ABNORMAL LOW (ref 26.0–34.0)
MCHC: 30 g/dL (ref 30.0–36.0)
MCV: 73.6 fL — ABNORMAL LOW (ref 80.0–100.0)
Monocytes Absolute: 0.4 10*3/uL (ref 0.1–1.0)
Monocytes Relative: 7 %
Neutro Abs: 4.9 10*3/uL (ref 1.7–7.7)
Neutrophils Relative %: 79 %
Platelets: 451 10*3/uL — ABNORMAL HIGH (ref 150–400)
RBC: 2.31 MIL/uL — ABNORMAL LOW (ref 3.87–5.11)
RDW: 19.7 % — ABNORMAL HIGH (ref 11.5–15.5)
WBC: 6.3 10*3/uL (ref 4.0–10.5)
nRBC: 0.8 % — ABNORMAL HIGH (ref 0.0–0.2)

## 2022-01-25 LAB — TROPONIN I (HIGH SENSITIVITY): Troponin I (High Sensitivity): 9 ng/L (ref <18)

## 2022-01-25 LAB — LIPASE, BLOOD: Lipase: 30 U/L (ref 11–51)

## 2022-01-25 MED ORDER — PANTOPRAZOLE SODIUM 40 MG IV SOLR
40.0000 mg | Freq: Once | INTRAVENOUS | Status: AC
  Administered 2022-01-26: 40 mg via INTRAVENOUS
  Filled 2022-01-25: qty 10

## 2022-01-25 MED ORDER — SODIUM CHLORIDE 0.9% IV SOLUTION
Freq: Once | INTRAVENOUS | Status: AC
  Filled 2022-01-25: qty 250

## 2022-01-25 NOTE — ED Triage Notes (Signed)
Pt presents to ER with c/o weakness, dizziness, and rectal bleeding x2 weeks.  Pt states she has hx of anemia and says she started having dark, tarry stools appx 2 weeks ago, that have been getting lighter recently.  Pt does endorse some abd pain around her navel along with some vomiting.  Pt is otherwise A&O x4 at this time in NAD in triage.

## 2022-01-25 NOTE — ED Provider Notes (Signed)
[facility] Provider Note    Event Date/Time   First MD Initiated Contact with Patient 01/25/22 2307     (approximate)   History   Weakness and Rectal Bleeding   HPI  Hailey Farrell is a 71 y.o. female  weakness, dizziness and rectal bleeding. Pt has history of anemia and says she started to have dark stool 2 weeks ago.  Patient reports initially really dark black tarry stools about a week ago these have since lightened up but she has been feeling more weak and dizzy.  She denies any falls or hitting her head.  Denies any chest pain.  She does report some abdominal pain.  She does report having history of GI bleeding in the past but never having abdominal pain with it. Denies any bright red blood  On review of records patient was seen on GI on May 2023 and has a history of small bowel AVMs.   Physical Exam   Triage Vital Signs: ED Triage Vitals  Enc Vitals Group     BP 01/25/22 2113 (!) 141/107     Pulse Rate 01/25/22 2113 94     Resp 01/25/22 2113 19     Temp 01/25/22 2113 98.3 F (36.8 C)     Temp Source 01/25/22 2113 Oral     SpO2 01/25/22 2113 94 %     Weight 01/25/22 2114 184 lb (83.5 kg)     Height 01/25/22 2114 '5\' 4"'$  (1.626 m)     Head Circumference --      Peak Flow --      Pain Score 01/25/22 2113 7     Pain Loc --      Pain Edu? --      Excl. in [place]? --     Most recent vital signs: Vitals:   01/25/22 2113  BP: (!) 141/107  Pulse: 94  Resp: 19  Temp: 98.3 F (36.8 C)  SpO2: 94%     General: Awake, no distress.  CV:  Good peripheral perfusion.  Resp:  Normal effort.  Abd:  No distention. Reports tenderness but no rebound or gaurding. Other:     ED Results / Procedures / Treatments   Labs (all labs ordered are listed, but only abnormal results are displayed) Labs Reviewed  CBC WITH DIFFERENTIAL/PLATELET - Abnormal; Notable for the following components:      Result Value   RBC 2.31 (*)    Hemoglobin 5.1 (*)    HCT  17.0 (*)    MCV 73.6 (*)    MCH 22.1 (*)    RDW 19.7 (*)    Platelets 451 (*)    nRBC 0.8 (*)    All other components within normal limits  COMPREHENSIVE METABOLIC PANEL - Abnormal; Notable for the following components:   Potassium 3.3 (*)    Glucose, Bld 110 (*)    Total Protein 6.2 (*)    Alkaline Phosphatase 30 (*)    All other components within normal limits  LIPASE, BLOOD  URINALYSIS, ROUTINE W REFLEX MICROSCOPIC  TYPE AND SCREEN  TROPONIN I (HIGH SENSITIVITY)  TROPONIN I (HIGH SENSITIVITY)     EKG  My interpretation of EKG:  Normal sinus rate of 99, no st elevation, no twi, normal intervals   RADIOLOGY I have reviewed the CT personally and interpretted no active bleeding but incidental findings noted   PROCEDURES:  Critical Care performed: yes  .1-3 Lead EKG Interpretation  Performed by: Vanessa Meagher, MD  Authorized by: Vanessa Keeler, MD     Interpretation: normal     ECG rate:  90   ECG rate assessment: normal     Rhythm: sinus rhythm     Ectopy: none     Conduction: normal   .Critical Care  Performed by: Vanessa [place], MD Authorized by: Vanessa [place], MD   Critical care provider statement:    Critical care time (minutes):  30   Critical care was necessary to treat or prevent imminent or life-threatening deterioration of the following conditions: anemia.   Critical care was time spent personally by me on the following activities:  Development of treatment plan with patient or surrogate, discussions with consultants, evaluation of patient's response to treatment, examination of patient, ordering and review of laboratory studies, ordering and review of radiographic studies, ordering and performing treatments and interventions, pulse oximetry, re-evaluation of patient's condition and review of old [place] ED: Medications - No data to display   IMPRESSION / MDM / [place] / ED COURSE  I reviewed the triage vital signs  and the nursing notes.   Patient's presentation is most consistent with acute presentation with potential threat to life or bodily function.  Patient comes in with abdominal pain and dark stools concerning for GI bleed.  Patient's labs ordered evaluate for anemia, AKI.  Given she does report some abdominal discomfort I have also ordered a CT angio to evaluate for any evidence of active bleeding versus abdominal infection.  CBC showed hemoglobin 5.1  CMP normal CR Trop negative  CT imaging was a lot of incidental findings I did provide a copy of report to family and they will follow these up outpatient with her primary care doctor and expressed understanding.  There was also some concern for adrenal issues which I have discussed with hospitalist.  Patient is getting 2 units of blood transfusion.  She remains stable although a little tachycardic.  Her rectal exam was brown stool but Hemoccult positive so patient given dose of Protonix.  I have discussed with the hospital team for admission  The patient is on the cardiac monitor to evaluate for evidence of arrhythmia and/or significant heart rate changes.      FINAL CLINICAL IMPRESSION(S) / ED DIAGNOSES   Final diagnoses:  Gastrointestinal hemorrhage, unspecified gastrointestinal hemorrhage type  Symptomatic anemia     Rx / DC Orders   ED Discharge Orders     None        Note:  This document was prepared using Dragon voice recognition software and may include unintentional dictation errors.   Vanessa [place], MD 01/26/22 [phone]

## 2022-01-26 ENCOUNTER — Emergency Department: Payer: Medicare Other

## 2022-01-26 DIAGNOSIS — Z853 Personal history of malignant neoplasm of breast: Secondary | ICD-10-CM | POA: Diagnosis not present

## 2022-01-26 DIAGNOSIS — E876 Hypokalemia: Secondary | ICD-10-CM

## 2022-01-26 DIAGNOSIS — Z981 Arthrodesis status: Secondary | ICD-10-CM | POA: Diagnosis not present

## 2022-01-26 DIAGNOSIS — F17210 Nicotine dependence, cigarettes, uncomplicated: Secondary | ICD-10-CM | POA: Diagnosis present

## 2022-01-26 DIAGNOSIS — K921 Melena: Secondary | ICD-10-CM | POA: Diagnosis not present

## 2022-01-26 DIAGNOSIS — K449 Diaphragmatic hernia without obstruction or gangrene: Secondary | ICD-10-CM | POA: Diagnosis present

## 2022-01-26 DIAGNOSIS — J449 Chronic obstructive pulmonary disease, unspecified: Secondary | ICD-10-CM

## 2022-01-26 DIAGNOSIS — R110 Nausea: Secondary | ICD-10-CM | POA: Diagnosis present

## 2022-01-26 DIAGNOSIS — K922 Gastrointestinal hemorrhage, unspecified: Secondary | ICD-10-CM | POA: Diagnosis present

## 2022-01-26 DIAGNOSIS — I708 Atherosclerosis of other arteries: Secondary | ICD-10-CM | POA: Diagnosis present

## 2022-01-26 DIAGNOSIS — D649 Anemia, unspecified: Secondary | ICD-10-CM | POA: Diagnosis not present

## 2022-01-26 DIAGNOSIS — E785 Hyperlipidemia, unspecified: Secondary | ICD-10-CM

## 2022-01-26 DIAGNOSIS — Z923 Personal history of irradiation: Secondary | ICD-10-CM | POA: Diagnosis not present

## 2022-01-26 DIAGNOSIS — I10 Essential (primary) hypertension: Secondary | ICD-10-CM

## 2022-01-26 DIAGNOSIS — M899 Disorder of bone, unspecified: Secondary | ICD-10-CM | POA: Diagnosis present

## 2022-01-26 DIAGNOSIS — D62 Acute posthemorrhagic anemia: Secondary | ICD-10-CM

## 2022-01-26 DIAGNOSIS — D509 Iron deficiency anemia, unspecified: Secondary | ICD-10-CM | POA: Diagnosis not present

## 2022-01-26 DIAGNOSIS — K5521 Angiodysplasia of colon with hemorrhage: Secondary | ICD-10-CM | POA: Diagnosis present

## 2022-01-26 DIAGNOSIS — G2581 Restless legs syndrome: Secondary | ICD-10-CM | POA: Diagnosis present

## 2022-01-26 DIAGNOSIS — Z8719 Personal history of other diseases of the digestive system: Secondary | ICD-10-CM | POA: Diagnosis not present

## 2022-01-26 DIAGNOSIS — Z91040 Latex allergy status: Secondary | ICD-10-CM | POA: Diagnosis not present

## 2022-01-26 DIAGNOSIS — Z96652 Presence of left artificial knee joint: Secondary | ICD-10-CM | POA: Diagnosis present

## 2022-01-26 DIAGNOSIS — M1990 Unspecified osteoarthritis, unspecified site: Secondary | ICD-10-CM | POA: Diagnosis present

## 2022-01-26 DIAGNOSIS — R718 Other abnormality of red blood cells: Secondary | ICD-10-CM | POA: Diagnosis present

## 2022-01-26 DIAGNOSIS — Z8249 Family history of ischemic heart disease and other diseases of the circulatory system: Secondary | ICD-10-CM | POA: Diagnosis not present

## 2022-01-26 DIAGNOSIS — D500 Iron deficiency anemia secondary to blood loss (chronic): Secondary | ICD-10-CM | POA: Diagnosis not present

## 2022-01-26 DIAGNOSIS — R1033 Periumbilical pain: Secondary | ICD-10-CM | POA: Diagnosis present

## 2022-01-26 DIAGNOSIS — R531 Weakness: Secondary | ICD-10-CM | POA: Diagnosis present

## 2022-01-26 DIAGNOSIS — R590 Localized enlarged lymph nodes: Secondary | ICD-10-CM | POA: Diagnosis present

## 2022-01-26 LAB — TROPONIN I (HIGH SENSITIVITY): Troponin I (High Sensitivity): 10 ng/L (ref <18)

## 2022-01-26 LAB — BASIC METABOLIC PANEL
Anion gap: 5 (ref 5–15)
BUN: 20 mg/dL (ref 8–23)
CO2: 26 mmol/L (ref 22–32)
Calcium: 8.6 mg/dL — ABNORMAL LOW (ref 8.9–10.3)
Chloride: 110 mmol/L (ref 98–111)
Creatinine, Ser: 0.84 mg/dL (ref 0.44–1.00)
GFR, Estimated: >60 mL/min (ref >60)
Glucose, Bld: 92 mg/dL (ref 70–99)
Potassium: 3.9 mmol/L (ref 3.5–5.1)
Sodium: 141 mmol/L (ref 135–145)

## 2022-01-26 LAB — CBC
HCT: 22.7 % — ABNORMAL LOW (ref 36.0–46.0)
Hemoglobin: 7 g/dL — ABNORMAL LOW (ref 12.0–15.0)
MCH: 24.6 pg — ABNORMAL LOW (ref 26.0–34.0)
MCHC: 30.8 g/dL (ref 30.0–36.0)
MCV: 79.6 fL — ABNORMAL LOW (ref 80.0–100.0)
Platelets: 351 10*3/uL (ref 150–400)
RBC: 2.85 MIL/uL — ABNORMAL LOW (ref 3.87–5.11)
RDW: 20.6 % — ABNORMAL HIGH (ref 11.5–15.5)
WBC: 5.4 10*3/uL (ref 4.0–10.5)
nRBC: 1.1 % — ABNORMAL HIGH (ref 0.0–0.2)

## 2022-01-26 LAB — HEMOGLOBIN AND HEMATOCRIT, BLOOD
HCT: 24.4 % — ABNORMAL LOW (ref 36.0–46.0)
HCT: 23.9 % — ABNORMAL LOW (ref 36.0–46.0)
Hemoglobin: 7.4 g/dL — ABNORMAL LOW (ref 12.0–15.0)
Hemoglobin: 7.6 g/dL — ABNORMAL LOW (ref 12.0–15.0)

## 2022-01-26 LAB — MAGNESIUM: Magnesium: 2.3 mg/dL (ref 1.7–2.4)

## 2022-01-26 MED ORDER — TRAZODONE HCL 50 MG PO TABS: 25.0000 mg | ORAL_TABLET | Freq: Every evening | ORAL | Status: DC | PRN

## 2022-01-26 MED ORDER — ALBUTEROL SULFATE HFA 108 (90 BASE) MCG/ACT IN AERS: 2.0000 | INHALATION_SPRAY | Freq: Four times a day (QID) | RESPIRATORY_TRACT | Status: DC | PRN

## 2022-01-26 MED ORDER — IOHEXOL 350 MG/ML SOLN
100.0000 mL | Freq: Once | INTRAVENOUS | Status: AC | PRN
  Administered 2022-01-26: 100 mL via INTRAVENOUS

## 2022-01-26 MED ORDER — ACETAMINOPHEN 325 MG PO TABS: 650.0000 mg | ORAL_TABLET | Freq: Four times a day (QID) | ORAL | Status: DC | PRN

## 2022-01-26 MED ORDER — PANTOPRAZOLE 80MG IVPB - SIMPLE MED
80.0000 mg | Freq: Once | INTRAVENOUS | Status: AC
  Administered 2022-01-26: 80 mg via INTRAVENOUS
  Filled 2022-01-26: qty 100

## 2022-01-26 MED ORDER — PEG 3350-KCL-NA BICARB-NACL 420 G PO SOLR
4000.0000 mL | Freq: Once | ORAL | Status: AC
  Administered 2022-01-26: 4000 mL via ORAL
  Filled 2022-01-26 (×2): qty 4000

## 2022-01-26 MED ORDER — POTASSIUM CHLORIDE IN NACL 20-0.9 MEQ/L-% IV SOLN
INTRAVENOUS | Status: DC
  Filled 2022-01-26: qty 1000

## 2022-01-26 MED ORDER — MOMETASONE FURO-FORMOTEROL FUM 200-5 MCG/ACT IN AERO
2.0000 | INHALATION_SPRAY | Freq: Two times a day (BID) | RESPIRATORY_TRACT | Status: DC
  Administered 2022-01-26 – 2022-01-28 (×3): 2 via RESPIRATORY_TRACT
  Filled 2022-01-26 (×2): qty 8.8

## 2022-01-26 MED ORDER — ONDANSETRON HCL 4 MG/2ML IJ SOLN: 4.0000 mg | Freq: Four times a day (QID) | INTRAMUSCULAR | Status: DC | PRN

## 2022-01-26 MED ORDER — AMITRIPTYLINE HCL 25 MG PO TABS
25.0000 mg | ORAL_TABLET | Freq: Every day | ORAL | Status: DC
  Administered 2022-01-26 – 2022-01-27 (×2): 25 mg via ORAL
  Filled 2022-01-26 (×3): qty 1

## 2022-01-26 MED ORDER — MAGNESIUM HYDROXIDE 400 MG/5ML PO SUSP: 30.0000 mL | Freq: Every day | ORAL | Status: DC | PRN

## 2022-01-26 MED ORDER — VITAMIN B-12 1000 MCG PO TABS
1000.0000 ug | ORAL_TABLET | Freq: Every day | ORAL | Status: DC
Start: 2022-01-26 — End: 2022-01-28
  Administered 2022-01-26 – 2022-01-28 (×3): 1000 ug via ORAL
  Filled 2022-01-26 (×3): qty 1

## 2022-01-26 MED ORDER — LACTATED RINGERS IV SOLN
INTRAVENOUS | Status: DC
Start: 2022-01-26 — End: 2022-01-28

## 2022-01-26 MED ORDER — PANTOPRAZOLE INFUSION (NEW) - SIMPLE MED
8.0000 mg/h | INTRAVENOUS | Status: DC
  Administered 2022-01-26 – 2022-01-27 (×2): 8 mg/h via INTRAVENOUS
  Filled 2022-01-26 (×4): qty 100

## 2022-01-26 MED ORDER — POTASSIUM CHLORIDE CRYS ER 20 MEQ PO TBCR
20.0000 meq | EXTENDED_RELEASE_TABLET | Freq: Every day | ORAL | Status: DC
  Administered 2022-01-26 – 2022-01-28 (×3): 20 meq via ORAL
  Filled 2022-01-26 (×3): qty 1

## 2022-01-26 MED ORDER — PANTOPRAZOLE SODIUM 40 MG IV SOLR: 40.0000 mg | Freq: Two times a day (BID) | INTRAVENOUS | Status: DC

## 2022-01-26 MED ORDER — ACETAMINOPHEN 650 MG RE SUPP: 650.0000 mg | Freq: Four times a day (QID) | RECTAL | Status: DC | PRN

## 2022-01-26 MED ORDER — SODIUM CHLORIDE 0.9 % IV SOLN: INTRAVENOUS | Status: DC

## 2022-01-26 MED ORDER — ALBUTEROL SULFATE (2.5 MG/3ML) 0.083% IN NEBU: 2.5000 mg | INHALATION_SOLUTION | Freq: Four times a day (QID) | RESPIRATORY_TRACT | Status: DC | PRN

## 2022-01-26 MED ORDER — ROPINIROLE HCL 1 MG PO TABS
2.0000 mg | ORAL_TABLET | Freq: Every evening | ORAL | Status: DC | PRN
  Administered 2022-01-26 – 2022-01-27 (×2): 2 mg via ORAL
  Filled 2022-01-26 (×2): qty 2

## 2022-01-26 MED ORDER — ONDANSETRON HCL 4 MG PO TABS: 4.0000 mg | ORAL_TABLET | Freq: Four times a day (QID) | ORAL | Status: DC | PRN

## 2022-01-26 MED ORDER — IRON SUCROSE 20 MG/ML IV SOLN
300.0000 mg | Freq: Once | INTRAVENOUS | Status: AC
  Administered 2022-01-26: 300 mg via INTRAVENOUS
  Filled 2022-01-26: qty 300

## 2022-01-26 MED ORDER — SIMVASTATIN 20 MG PO TABS
20.0000 mg | ORAL_TABLET | Freq: Every evening | ORAL | Status: DC
  Administered 2022-01-26 – 2022-01-27 (×2): 20 mg via ORAL
  Filled 2022-01-26 (×2): qty 1

## 2022-01-26 MED ORDER — LISINOPRIL 20 MG PO TABS
20.0000 mg | ORAL_TABLET | Freq: Every day | ORAL | Status: DC
  Administered 2022-01-26 – 2022-01-28 (×3): 20 mg via ORAL
  Filled 2022-01-26 (×2): qty 1
  Filled 2022-01-26: qty 2

## 2022-01-26 NOTE — ED Notes (Signed)
Patient sitting in chair at bedside

## 2022-01-26 NOTE — Hospital Course (Addendum)
Hailey Farrell is a 71 y.o. African-American female with medical history significant for COPD, hypertension, dyslipidemia, migraine and restless leg syndrome as well as osteoarthritis, who presented to the emergency room with acute onset of melena for the last couple of weeks with associated generalized weakness and dizziness. Her hemoglobin was 5.1, she was given 2 units of PRBC, hemoglobin increased to 7.0.  Patient is also given IV iron and IV Protonix.  GI consult obtained.  8/25.  EGD and colonoscopy did not show any evidence of bleed.  Capsule endoscopy started.  Received 2 more units of blood transfusion for hemoglobin 6.6.  8/26.  No additional GI bleeding, hemoglobin stable.  Patient be received another dose of IV iron, medically stable to be discharged.

## 2022-01-26 NOTE — Assessment & Plan Note (Signed)
-   Potassium will be replaced and magnesium level will be checked. ?

## 2022-01-26 NOTE — Assessment & Plan Note (Signed)
-   We will continue her antihypertensives. 

## 2022-01-26 NOTE — Assessment & Plan Note (Signed)
-   We will continue her inhalers. 

## 2022-01-26 NOTE — ED Notes (Signed)
Patient sent to CT scan in no acute distress

## 2022-01-26 NOTE — Assessment & Plan Note (Signed)
-   We will continue statin therapy. 

## 2022-01-26 NOTE — ED Notes (Signed)
Patient ambulatory to restroom without difficulty. 

## 2022-01-26 NOTE — Progress Notes (Signed)
  Progress Note   Patient: Hailey Farrell IRW:[id number] DOB: 06-16-1950 DOA: 01/25/2022     0 DOS: the patient was seen and examined on 01/26/2022   Brief hospital course: Hailey Farrell is a 71 y.o. African-American female with medical history significant for COPD, hypertension, dyslipidemia, migraine and restless leg syndrome as well as osteoarthritis, who presented to the emergency room with acute onset of melena for the last couple of weeks with associated generalized weakness and dizziness. Her hemoglobin was 5.1, she was given 2 units of PRBC, hemoglobin increased to 7.0.  Patient is also given IV iron and IV Protonix.  GI consult obtained.  Assessment and Plan: * GI bleeding Acute post-hemorrhagic anemia Patient has received 2 units PRBC, hemoglobin increased to 7.0.  Continue PPI IV. GI consult obtained. Patient has a history of iron deficient anemia, was regular receiving iron infusion in the cancer center, will give 1 dose of IV iron. Continue monitor hemoglobin every 6 hours, transfuse as needed. Patient did not have additional black stool since admission to the hospital.  Hypokalemia Potassium has normalized, continue some IV fluids with lactated ringer.  Essential hypertension Continue to monitor.  Restless leg syndrome ropinirole.  Chronic obstructive pulmonary disease (COPD) (HCC) Stable.  Dyslipidemia continue statin therapy.  Internal iliac artery stenosis. Incidental finding on CT scan.  Patient has no evidence of lower extremity ischemia.  Patient will follow up with vascular surgery as outpatient.  Intra-abdominal lymphadenopathy. We will refer patient to oncology follow-up after discharge.  Adrenal gland stranding on CT scan. Check a.m. cortisol level to rule out adrenal insufficiency.  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 An incidental finding on CT scan, will be followed by oncology as outpatient.      Subjective:  Patient feels better today, less  dizzy.  Had a bowel movement, does not look black. No nausea vomiting or abdominal pain.  Physical Exam: Vitals:   01/26/22 0550 01/26/22 0600 01/26/22 0645 01/26/22 0700  BP: 124/86 123/61 (!) 140/81 (!) 146/82  Pulse: (!) 104 (!) 103 98 94  Resp: '18 17 17 17  '$ Temp: 98.4 F (36.9 C)  98.4 F (36.9 C) 98.4 F (36.9 C)  TempSrc: Oral  Oral   SpO2:  100% 96% 97%  Weight:      Height:       General exam: Appears calm and comfortable  Respiratory system: Clear to auscultation. Respiratory effort normal. Cardiovascular system: S1 & S2 heard, RRR. No JVD, murmurs, rubs, gallops or clicks. No pedal edema. Gastrointestinal system: Abdomen is nondistended, soft and nontender. No organomegaly or masses felt. Normal bowel sounds heard. Central nervous system: Alert and oriented. No focal neurological deficits. Extremities: Symmetric 5 x 5 power. Skin: No rashes, lesions or ulcers Psychiatry: Judgement and insight appear normal. Mood & affect appropriate.   Data Reviewed:  Reviewed the CT scan results as well as lab results.  Family Communication: None  Disposition: Status is: Inpatient Remains inpatient appropriate because: Severity of disease, IV treatment and a inpatient procedure planned.  Planned Discharge Destination: Home with Home Health    Time spent: no charge minutes  Author: Sharen Hones, MD 01/26/2022 10:51 AM  For on call review [email or web address].

## 2022-01-26 NOTE — Assessment & Plan Note (Signed)
-   The patient be placed on IV PPI therapy and transfuse with 2 units of packed red blood cells. - We will follow posttransfusion H&H. - GI consultation will be obtained as mentioned above.

## 2022-01-26 NOTE — Assessment & Plan Note (Addendum)
-   The patient admitted to a program stated bed. - She was typed and crossmatched will be transfused 2 units of packed red blood cells. - We will follow posttransfusion H&H. - We will place on IV Protonix with bolus and infusion. - The patient will be hydrated with IV normal saline. -GI consultation will be obtained. - We will notify Dr. Vicente Males about the patient.

## 2022-01-26 NOTE — Assessment & Plan Note (Signed)
-   We will continue ropinirole.

## 2022-01-26 NOTE — Consult Note (Signed)
[name] , MD [address], [place], [place], [place], [address] [address], [place], [place], [place], [address] Phone: [phone]  Fax: [phone]  Consultation  Referring Provider:   Dr. Sidney Ace Primary Care Physician:  Letta Median, MD Primary Gastroenterologist:  Dr. Marius Ditch         Reason for Consultation:     GI bleed  Date of Admission:  01/25/2022 Date of Consultation:  01/26/2022         HPI:   NYAJAH HYSON is a 71 y.o. female follows with Dr. 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  History of parenteral IV iron therapy admitted in February 2023 through 2 acute anemia requiring transfusion.  Underwent enteroscopy in May 2023 no abnormalities were seen.  Medium size hiatal hernia was noted.  Colonoscopy last was in 2019 5 mm polyp in the sigmoid colon and a 2 mm polyp in the sigmoid colon resected otherwise no gross abnormalities capsule study in October 2022 : Impaired visualization due to poor prep.   Admitted today with rectal bleeding melena.  CT abdomen angiogram showed no acute bleeding.  In June 2023 ferritin was 63 on admission hemoglobin 5.1 g with an MCV of 73.62 months back hemoglobin was 10 g.  CMP showed no elevation in BUN/creatinine ratio.  She states that she had black tarry stools last week but has been brown this week. No abdominal pain, no nsaid use .  No GI complaints.   Past Medical History:  Diagnosis Date   Anemia    Arthritis    Breast cancer ([place]) 1990   Right   COPD (chronic obstructive pulmonary disease) ([place])    Dyspnea    DOE   Elevated lipids    Hypertension    Lower extremity edema    Migraines    Migraines    Personal history of radiation therapy 1990   Breast   Restless leg syndrome    Rotator cuff injury     Past Surgical History:  Procedure Laterality Date   ABDOMINAL HYSTERECTOMY     BACK SURGERY   08/2017   CERVICAL FUSION   BREAST BIOPSY Right 1990   LUMPECTOMY.  took lymph nodes   BREAST LUMPECTOMY     COLONOSCOPY WITH PROPOFOL N/A 07/20/2017   Procedure: COLONOSCOPY WITH PROPOFOL;  Surgeon: Virgel Manifold, MD;  Location: [facility] ENDOSCOPY;  Service: Endoscopy;  Laterality: N/A;   ENTEROSCOPY N/A 08/08/2017   Procedure: ENTEROSCOPY;  Surgeon: Lin Landsman, MD;  Location: [facility] ENDOSCOPY;  Service: Gastroenterology;  Laterality: N/A;   ENTEROSCOPY N/A 05/07/2020   Procedure: ENTEROSCOPY with Adult colonoscope;  Surgeon: Virgel Manifold, MD;  Location: [facility] ENDOSCOPY;  Service: Endoscopy;  Laterality: N/A;   ENTEROSCOPY N/A 03/17/2021   Procedure: ENTEROSCOPY;  Surgeon: Virgel Manifold, MD;  Location: [facility] ENDOSCOPY;  Service: Endoscopy;  Laterality: N/A;  PUSH   ENTEROSCOPY N/A 07/13/2021   Procedure: ENTEROSCOPY;  Surgeon: Lin Landsman, MD;  Location: [facility] ENDOSCOPY;  Service: Gastroenterology;  Laterality: N/A;   ENTEROSCOPY N/A 10/26/2021   Procedure: ENTEROSCOPY;  Surgeon: Lin Landsman, MD;  Location: [facility] ENDOSCOPY;  Service: Gastroenterology;  Laterality: N/A;  push   ESOPHAGOGASTRODUODENOSCOPY Left 04/13/2017   Procedure: ESOPHAGOGASTRODUODENOSCOPY (EGD);  Surgeon: Virgel Manifold, MD;  Location: [facility] ENDOSCOPY;  Service: Endoscopy;  Laterality: Left;   ESOPHAGOGASTRODUODENOSCOPY (EGD) WITH PROPOFOL N/A 07/20/2017  Procedure: ESOPHAGOGASTRODUODENOSCOPY (EGD) WITH PROPOFOL;  Surgeon: Virgel Manifold, MD;  Location: [facility] ENDOSCOPY;  Service: Endoscopy;  Laterality: N/A;   ESOPHAGOGASTRODUODENOSCOPY (EGD) WITH PROPOFOL N/A 12/12/2017   Procedure: ESOPHAGOGASTRODUODENOSCOPY (EGD) WITH PROPOFOL;  Surgeon: Virgel Manifold, MD;  Location: [facility] ENDOSCOPY;  Service: Endoscopy;  Laterality: N/A;   ESOPHAGOGASTRODUODENOSCOPY (EGD) WITH PROPOFOL N/A 12/23/2020   Procedure: ESOPHAGOGASTRODUODENOSCOPY (EGD) WITH PROPOFOL;  Surgeon: Virgel Manifold, MD;  Location: [facility] ENDOSCOPY;  Service: Endoscopy;  Laterality: N/A;   GIVENS CAPSULE STUDY N/A 03/10/2021   Procedure: GIVENS CAPSULE STUDY;  Surgeon: Virgel Manifold, MD;  Location: [facility] ENDOSCOPY;  Service: Endoscopy;  Laterality: N/A;   MEDIAL PARTIAL KNEE REPLACEMENT Left 1990   torn ligament. no knee replacement at that time   PARTIAL HYSTERECTOMY     SHOULDER ARTHROSCOPY WITH OPEN ROTATOR CUFF REPAIR Left 03/01/2016   Procedure: SHOULDER ARTHROSCOPY WITH OPEN ROTATOR CUFF REPAIR;  Surgeon: Thornton Park, MD;  Location: [facility] ORS;  Service: Orthopedics;  Laterality: Left;   TOTAL KNEE ARTHROPLASTY Left 02/14/2018   Procedure: TOTAL KNEE ARTHROPLASTY;  Surgeon: Thornton Park, MD;  Location: [facility] ORS;  Service: Orthopedics;  Laterality: Left;    Prior to Admission medications   Medication Sig Start Date End Date Taking? Authorizing Provider  acetaminophen (TYLENOL) 650 MG CR tablet Take 650 mg by mouth every 8 (eight) hours as needed for pain.    [provider]  albuterol (VENTOLIN HFA) 108 (90 Base) MCG/ACT inhaler Inhale 2 puffs into the lungs every 6 (six) hours as needed for wheezing.    [provider]  amitriptyline (ELAVIL) 25 MG tablet Take 25 mg by mouth at bedtime. 04/13/20   [provider]  budesonide-formoterol (SYMBICORT) 160-4.5 MCG/ACT inhaler Inhale 2 puffs into the lungs 2 (two) times daily.    [provider]  diphenhydramine-acetaminophen (TYLENOL PM) 25-500 MG TABS tablet Take 1-2 tablets by mouth at bedtime as needed (pain).    [provider]  furosemide (LASIX) 20 MG tablet Take 20 mg daily by mouth.     [provider]  lisinopril (PRINIVIL,ZESTRIL) 20 MG tablet Take 20 mg by mouth daily.    [provider]  potassium chloride (MICRO-K) 10 MEQ CR capsule Take 10 mEq by mouth daily.    [provider]  rOPINIRole (REQUIP) 2 MG tablet Take 2-4 mg by mouth at bedtime as needed  (restless leg syndrome).    [provider]  simvastatin (ZOCOR) 20 MG tablet Take 20 mg by mouth daily.     [provider]  tiotropium (SPIRIVA) 18 MCG inhalation capsule Place 1 capsule (18 mcg total) into inhaler and inhale daily. 07/14/21   Sharen Hones, MD  vitamin B-12 (CYANOCOBALAMIN) 1000 MCG tablet Take 1,000 mcg by mouth daily.    [provider]    Family History  Problem Relation Age of Onset   CAD Mother    CAD Sister    Breast cancer Neg Hx      Social History   Tobacco Use   Smoking status: Some Days    Packs/day: 0.25    Years: 50.00    Total pack years: 12.50    Types: Cigarettes    Last attempt to quit: 03/13/2016    Years since quitting: 5.8   Smokeless tobacco: Never  Vaping Use   Vaping Use: Never used  Substance Use Topics   Alcohol use: No   Drug use: No    Allergies as of 01/25/2022 -  Review Complete 01/25/2022  Allergen Reaction Noted   Latex Rash 02/03/2015    Review of Systems:    All systems reviewed and negative except where noted in HPI.   Physical Exam:  Vital signs in last 24 hours: Temp:  [98 F (36.7 C)-98.9 F (37.2 C)] 98.4 F (36.9 C) (08/24 0700) Pulse Rate:  [94-111] 94 (08/24 0700) Resp:  [17-19] 17 (08/24 0700) BP: (101-146)/(54-107) 146/82 (08/24 0700) SpO2:  [94 %-100 %] 97 % (08/24 0700) Weight:  [83.5 kg] 83.5 kg (08/23 2114)   General:   Pleasant, cooperative in NAD Head:  Normocephalic and atraumatic. Eyes:   No icterus.   Conjunctiva pink. PERRLA. Ears:  Normal auditory acuity. Neck:  Supple; no masses or thyroidomegaly Lungs: Respirations even and unlabored. Lungs clear to auscultation bilaterally.   No wheezes, crackles, or rhonchi.  Heart:  Regular rate and rhythm;  Without murmur, clicks, rubs or gallops Abdomen:  Soft, nondistended, nontender. Normal bowel sounds. No appreciable masses or hepatomegaly.  No rebound or guarding.  Neurologic:  Alert and oriented x3;  grossly normal  neurologically. Psych:  Alert and cooperative. Normal affect.  LAB RESULTS: Recent Labs    01/25/22 2123 01/26/22 0926  WBC 6.3 5.4  HGB 5.1* 7.0*  HCT 17.0* 22.7*  PLT 451* 351   BMET Recent Labs    01/25/22 2123 01/26/22 0926  NA 140 141  K 3.3* 3.9  CL 108 110  CO2 25 26  GLUCOSE 110* 92  BUN 21 20  CREATININE 0.97 0.84  CALCIUM 8.9 8.6*   LFT Recent Labs    01/25/22 2123  PROT 6.2*  ALBUMIN 3.8  AST 15  ALT 13  ALKPHOS 30*  BILITOT 0.5   PT/INR No results for input(s): "LABPROT", "INR" in the last 72 hours.  STUDIES: CT Angio Abd/Pel W and/or Wo Contrast  Result Date: 01/26/2022 CLINICAL DATA:  Lower GI bleed with dizziness, weakness and rectal bleeding. EXAM: CTA ABDOMEN AND PELVIS WITHOUT AND WITH CONTRAST TECHNIQUE: Multidetector CT imaging of the abdomen and pelvis was performed using the standard protocol during bolus administration of intravenous contrast. Multiplanar reconstructed images and MIPs were obtained and reviewed to evaluate the vascular anatomy. RADIATION DOSE REDUCTION: This exam was performed according to the departmental dose-optimization program which includes automated exposure control, adjustment of the mA and/or kV according to patient size and/or use of iterative reconstruction technique. CONTRAST:  131m OMNIPAQUE IOHEXOL 350 MG/ML SOLN COMPARISON:  None Available. FINDINGS: VASCULAR Aorta: There is moderately heavy calcific wall plaque without stenosis, aneurysm or dissection. Celiac: There are ostial calcific plaques and compression by the median arcuate ligament of the diaphragm causing no more than 30% origin stenosis. The vessel is otherwise widely patent. SMA: There are ostial calcifications, without stenosis, dissection or aneurysm. Renals: Both renal arteries are single. There are bilateral nonstenosing ostial calcifications. Both vessels are widely patent. There are asymmetric renovascular calcifications at the right greater than  left renal hila. IMA: Patent without evidence of aneurysm, dissection, vasculitis or significant stenosis. Inflow: Moderate to heavy calcific plaques involve both common femoral arteries, without flow-limiting stenosis. There is about 30% stenosis of both vessels. There are high-grade mixed plaque origin stenoses of both internal iliac arteries, with mild to moderate irregular calcific stenosis with the remainder. There are mild scattered calcifications in the external iliac arteries but neither of these arteries has a flow-limiting stenosis, aneurysm or dissection. Proximal Outflow: There are moderate calcifications in both common femoral arteries and both  proximal superficial femoral arteries, without flow-limiting stenosis. Both deep femoral arteries show scattered non stenosing plaques without focal stenosis. The circumflex femoral arteries appear clear. Veins: Patent. This includes the pelvic deep veins and proximal common femoral veins. Review of the MIP images confirms the above findings. NON-VASCULAR Lower chest: Mild cardiomegaly. Minimal pericardial effusion. The intraventricular blood pool is low in density on the noncontrast exam consistent with anemia. There is a nonspecific mildly prominent right infrahilar lymph node of 1.5 cm short axis. There is a 6 mm noncalcified left lower lobe medial basal nodule on series 13 axial 32, and an 8 mm noncalcified nodule in the posteromedial left lower lobe on 13:11. There are few linear scar-like opacities in both bases and slightly elevated right hemidiaphragm. Hepatobiliary: No focal liver abnormality is seen. No calcified gallstones, gallbladder wall thickening, or biliary dilatation. Pancreas: No focal abnormality. Spleen: No focal abnormality.  No splenomegaly. Adrenals/Urinary Tract: On all 3 study phases there is periadrenal stranding but no significant perinephric stranding. There are few tiny right renal cortical hypodensities which are too small to  characterize.No follow-up imaging is recommended. JACR 2018 Feb; 264-273, Management of the Incidental RenalMass on CT, [employer] 2021; 814-848, Bosniak Classification of Cystic Renal Masses, Version 2019. There is no adrenal mass. Unremarkable left renal cortex. There is no urinary stone or obstruction. There is no bladder thickening. Stomach/Bowel: Small hiatal hernia. No dilatation or wall thickening including the appendix. Scattered uncomplicated colonic diverticula noted, mild fecal stasis ascending colon. No contrast extravasation into the bowel is seen. Lymphatic: No appreciable adenopathy. Reproductive: Status post hysterectomy. No adnexal masses. Other: Small umbilical fat hernia. No free air, free hemorrhage or free fluid. No incarcerated hernia. Musculoskeletal: Advanced degenerative disc collapse L5-S1 is noted with grade 1 L5-S1 retrolisthesis. There is a heterogeneous slightly expansile intramedullary marrow lesion in the anterior column of the right acetabulum which measures 2.9 x 1.8 x 3.9 cm, with a well-defined sclerotic border and a complex matrix, but with overall nonaggressive appearance. There is grade 1 anterolisthesis at L4-5 due to advanced facet hypertrophy at this level, with acquired spinal canal stenosis. Foraminal stenosis is seen in L4-5 and especially severely L5-S1. IMPRESSION: VASCULAR 1. Aortic and branch vessel atherosclerosis but no flow-limiting visceral artery stenoses. 2. Inflow stenoses are seen but only in the internal iliac arteries and not in the external iliac arteries, with no significant proximal outflow stenosis. 3. Cardiomegaly with low density of the cardiac blood pool on unenhanced scanning consistent with anemia. NON-VASCULAR 1. Nonspecific mildly enlarged right hilar lymph node and 2 left lower lobe nodules measuring 6 mm and 8 mm. Per Fleischner Society Guidelines, recommend a non-contrast Chest CT at 3-6 months. If patient is high risk for malignancy,  recommend an additional non-contrast Chest CT at 18-24 months; if patient is low risk for malignancy a non-contrast Chest CT at 18-24 months is optional. These guidelines do not apply to immunocompromised patients and patients with cancer. Follow up in patients with significant comorbidities as clinically warranted. For lung cancer screening, adhere to Lung-RADS guidelines. Reference: Radiology. 2017; 284(1):228-43. 2. Bilateral periadrenal stranding on all 3 phases of the study. This may be seen with adrenal congestion and may precede nontraumatic adrenal hemorrhage although adrenal no hemorrhage is currently seen. Close clinical follow-up with evaluation of adrenal function is recommended to determine of exogenous steroid treatment is needed, with repeat adrenal function tests as needed. 3. Scattered uncomplicated diverticulosis. 4. Heterogeneous slightly expansile lesion in the anterior column of the  right acetabulum measuring 2.9 x 1.8 x 3.9 cm but with a nonaggressive appearance and a well-defined sclerotic border. Nonemergent follow-up MRI without and with contrast recommended. Electronically Signed   By: Telford Nab M.D.   On: 01/26/2022 03:10      Impression / Plan:   EVEREST HACKING is a 71 y.o. y/o female with a history of iron deficiency anemia secondary to small bowel AVMs.  Recent enteroscopy in May 2023 showed no abnormalities.  Last colonoscopy was in 2019.  Capsule study in 2022 was kind of incomplete as visualization was impaired.  Presently admitted with melena as well as significant microcytic anemia.  No iron studies checked on admission.  Despite microcytic anemia.  Plan 1.  Check iron studies, B12, folate, urine analysis for blood loss, celiac serology 2.  Push enteroscopy and colonoscopy tomorrow, if negative capsule study of the small bowel tomorrow  3.  Monitor CBC and transfuse as needed 4.  Blood transfusion and if iron studies or B12 are low please supplement. 5.  Based on  endoscopy report tomorrow may require outpatient somatostatin injections   I have discussed alternative options, risks & benefits,  which include, but are not limited to, bleeding, infection, perforation,respiratory complication & drug reaction.  The patient agrees with this plan & written consent will be obtained.     Thank you for involving me in the care of this patient.      LOS: 0 days   [name], MD  01/26/2022, 10:56 AM

## 2022-01-26 NOTE — H&P (Addendum)
[place]   PATIENT NAME: Hailey Farrell    MR#:  [id number]  DATE OF BIRTH:  12/08/1950  DATE OF ADMISSION:  01/25/2022  PRIMARY CARE PHYSICIAN: Letta Median, MD   Patient is coming from: Home  REQUESTING/REFERRING PHYSICIAN: Marjean Donna, MD  CHIEF COMPLAINT:   Chief Complaint  Patient presents with   Weakness   Rectal Bleeding    HISTORY OF PRESENT ILLNESS:  Hailey Farrell is a 71 y.o. African-American female with medical history significant for COPD, hypertension, dyslipidemia, migraine and restless leg syndrome as well as osteoarthritis, who presented to the emergency room with acute onset of melena for the last couple of weeks with associated generalized weakness and dizziness.  It has becoming brownish lately.  She admits to nausea without vomiting or heartburn.  She has mild periumbilical abdominal pain.  No fever or chills.  No cough or wheezing or dyspnea or hemoptysis.  No dysuria, oliguria or hematuria or flank pain.  She denies any chest pain or palpitations.  ED Course: When she came to the ER BP wa 141/107 and later 108/74 with a heart rate of 4 and later 110 with otherwise normal vital signs.  Labs reveal hypokalemia 3.3 and high sensitive troponin I was 9 and later 10 with CBC showing hemoglobin of 5.1 hematocrit 17 compared to 10/32.8 with microcytosis. EKG as reviewed by me : EKG showed normal sinus rhythm with a rate of 99 with LVH with slightly poor R wave progression Imaging: Abdominal pelvic CTA revealed revealed the following: 1. Aortic and branch vessel atherosclerosis but no flow-limiting visceral artery stenoses. 2. Inflow stenoses are seen but only in the internal iliac arteries and not in the external iliac arteries, with no significant proximal outflow stenosis. 3. Cardiomegaly with low density of the cardiac blood pool on unenhanced scanning consistent with anemia. 1. Nonspecific mildly enlarged right hilar lymph node and 2 left lower  lobe nodules measuring 6 mm and 8 mm. Per Fleischner Society Guidelines, recommend a non-contrast Chest CT at 3-6 months. If patient is high risk for malignancy, recommend an additional non-contrast Chest CT at 18-24 months; if patient is low risk for malignancy a non-contrast Chest CT at 18-24 months is optional. These guidelines do not apply to immunocompromised patients and patients with cancer. Follow up in patients with significant comorbidities as clinically warranted. For lung cancer screening, adhere to Lung-RADS guidelines. Reference: Radiology. 2017; 284(1):228-43. 2. Bilateral periadrenal stranding on all 3 phases of the study. This may be seen with adrenal congestion and may precede nontraumatic adrenal hemorrhage although adrenal no hemorrhage is currently seen. Close clinical follow-up with evaluation of adrenal function is recommended to determine of exogenous steroid treatment is needed, with repeat adrenal function tests as needed. 3. Scattered uncomplicated diverticulosis. 4. Heterogeneous slightly expansile lesion in the anterior column of the right acetabulum measuring 2.9 x 1.8 x 3.9 cm but with a nonaggressive appearance and a well-defined sclerotic border. Nonemergent follow-up MRI without and with contrast recommended.  The patient was given 40 mg of IV Protonix.  She will be admitted to a progressive unit bed for further evaluation and management. PAST MEDICAL HISTORY:   Past Medical History:  Diagnosis Date   Anemia    Arthritis    Breast cancer ([place]) 1990   Right   COPD (chronic obstructive pulmonary disease) ([place])    Dyspnea    DOE   Elevated lipids    Hypertension    Lower extremity  edema    Migraines    Migraines    Personal history of radiation therapy 1990   Breast   Restless leg syndrome    Rotator cuff injury     PAST SURGICAL HISTORY:   Past Surgical History:  Procedure Laterality Date   ABDOMINAL HYSTERECTOMY     BACK SURGERY   08/2017   CERVICAL FUSION   BREAST BIOPSY Right 1990   LUMPECTOMY.  took lymph nodes   BREAST LUMPECTOMY     COLONOSCOPY WITH PROPOFOL N/A 07/20/2017   Procedure: COLONOSCOPY WITH PROPOFOL;  Surgeon: Virgel Manifold, MD;  Location: [facility] ENDOSCOPY;  Service: Endoscopy;  Laterality: N/A;   ENTEROSCOPY N/A 08/08/2017   Procedure: ENTEROSCOPY;  Surgeon: Lin Landsman, MD;  Location: [facility] ENDOSCOPY;  Service: Gastroenterology;  Laterality: N/A;   ENTEROSCOPY N/A 05/07/2020   Procedure: ENTEROSCOPY with Adult colonoscope;  Surgeon: Virgel Manifold, MD;  Location: [facility] ENDOSCOPY;  Service: Endoscopy;  Laterality: N/A;   ENTEROSCOPY N/A 03/17/2021   Procedure: ENTEROSCOPY;  Surgeon: Virgel Manifold, MD;  Location: [facility] ENDOSCOPY;  Service: Endoscopy;  Laterality: N/A;  PUSH   ENTEROSCOPY N/A 07/13/2021   Procedure: ENTEROSCOPY;  Surgeon: Lin Landsman, MD;  Location: [facility] ENDOSCOPY;  Service: Gastroenterology;  Laterality: N/A;   ENTEROSCOPY N/A 10/26/2021   Procedure: ENTEROSCOPY;  Surgeon: Lin Landsman, MD;  Location: [facility] ENDOSCOPY;  Service: Gastroenterology;  Laterality: N/A;  push   ESOPHAGOGASTRODUODENOSCOPY Left 04/13/2017   Procedure: ESOPHAGOGASTRODUODENOSCOPY (EGD);  Surgeon: Virgel Manifold, MD;  Location: [facility] ENDOSCOPY;  Service: Endoscopy;  Laterality: Left;   ESOPHAGOGASTRODUODENOSCOPY (EGD) WITH PROPOFOL N/A 07/20/2017   Procedure: ESOPHAGOGASTRODUODENOSCOPY (EGD) WITH PROPOFOL;  Surgeon: Virgel Manifold, MD;  Location: [facility] ENDOSCOPY;  Service: Endoscopy;  Laterality: N/A;   ESOPHAGOGASTRODUODENOSCOPY (EGD) WITH PROPOFOL N/A 12/12/2017   Procedure: ESOPHAGOGASTRODUODENOSCOPY (EGD) WITH PROPOFOL;  Surgeon: Virgel Manifold, MD;  Location: [facility] ENDOSCOPY;  Service: Endoscopy;  Laterality: N/A;   ESOPHAGOGASTRODUODENOSCOPY (EGD) WITH PROPOFOL N/A 12/23/2020   Procedure: ESOPHAGOGASTRODUODENOSCOPY (EGD) WITH PROPOFOL;  Surgeon: Virgel Manifold, MD;  Location: [facility] ENDOSCOPY;  Service: Endoscopy;  Laterality: N/A;   GIVENS CAPSULE STUDY N/A 03/10/2021   Procedure: GIVENS CAPSULE STUDY;  Surgeon: Virgel Manifold, MD;  Location: [facility] ENDOSCOPY;  Service: Endoscopy;  Laterality: N/A;   MEDIAL PARTIAL KNEE REPLACEMENT Left 1990   torn ligament. no knee replacement at that time   PARTIAL HYSTERECTOMY     SHOULDER ARTHROSCOPY WITH OPEN ROTATOR CUFF REPAIR Left 03/01/2016   Procedure: SHOULDER ARTHROSCOPY WITH OPEN ROTATOR CUFF REPAIR;  Surgeon: Thornton Park, MD;  Location: [facility] ORS;  Service: Orthopedics;  Laterality: Left;   TOTAL KNEE ARTHROPLASTY Left 02/14/2018   Procedure: TOTAL KNEE ARTHROPLASTY;  Surgeon: Thornton Park, MD;  Location: [facility] ORS;  Service: Orthopedics;  Laterality: Left;    SOCIAL HISTORY:   Social History   Tobacco Use   Smoking status: Some Days    Packs/day: 0.25    Years: 50.00    Total pack years: 12.50    Types: Cigarettes    Last attempt to quit: 03/13/2016    Years since quitting: 5.8   Smokeless tobacco: Never  Substance Use Topics   Alcohol use: No    FAMILY HISTORY:   Family History  Problem Relation Age of Onset   CAD Mother    CAD Sister    Breast cancer Neg Hx     DRUG ALLERGIES:   Allergies  Allergen Reactions   Latex Rash  Welts over body    REVIEW OF SYSTEMS:   ROS As per history of present illness. All pertinent systems were reviewed above. Constitutional, HEENT, cardiovascular, respiratory, GI, GU, musculoskeletal, neuro, psychiatric, endocrine, integumentary and hematologic systems were reviewed and are otherwise negative/unremarkable except for positive findings mentioned above in the HPI.   MEDICATIONS AT HOME:   Prior to Admission medications   Medication Sig Start Date End Date Taking? Authorizing Provider  acetaminophen (TYLENOL) 650 MG CR tablet Take 650 mg by mouth every 8 (eight) hours as needed for pain.    [provider]   albuterol (VENTOLIN HFA) 108 (90 Base) MCG/ACT inhaler Inhale 2 puffs into the lungs every 6 (six) hours as needed for wheezing.    [provider]  amitriptyline (ELAVIL) 25 MG tablet Take 25 mg by mouth at bedtime. 04/13/20   [provider]  budesonide-formoterol (SYMBICORT) 160-4.5 MCG/ACT inhaler Inhale 2 puffs into the lungs 2 (two) times daily.    [provider]  diphenhydramine-acetaminophen (TYLENOL PM) 25-500 MG TABS tablet Take 1-2 tablets by mouth at bedtime as needed (pain).    [provider]  furosemide (LASIX) 20 MG tablet Take 20 mg daily by mouth.     [provider]  lisinopril (PRINIVIL,ZESTRIL) 20 MG tablet Take 20 mg by mouth daily.    [provider]  potassium chloride (MICRO-K) 10 MEQ CR capsule Take 10 mEq by mouth daily.    [provider]  rOPINIRole (REQUIP) 2 MG tablet Take 2-4 mg by mouth at bedtime as needed (restless leg syndrome).    [provider]  simvastatin (ZOCOR) 20 MG tablet Take 20 mg by mouth daily.     [provider]  tiotropium (SPIRIVA) 18 MCG inhalation capsule Place 1 capsule (18 mcg total) into inhaler and inhale daily. 07/14/21   Sharen Hones, MD  vitamin B-12 (CYANOCOBALAMIN) 1000 MCG tablet Take 1,000 mcg by mouth daily.    [provider]      VITAL SIGNS:  Blood pressure 102/60, pulse (!) 103, temperature 98.5 F (36.9 C), temperature source Oral, resp. rate 18, height '5\' 4"'$  (1.626 m), weight 83.5 kg, SpO2 94 %.  PHYSICAL EXAMINATION:  Physical Exam  GENERAL:  71 y.o.-year-old patient lying in the bed with no acute distress.  EYES: Pupils equal, round, reactive to light and accommodation. No scleral icterus.  Positive pallor.  Extraocular muscles intact.  HEENT: Head atraumatic, normocephalic. Oropharynx and nasopharynx clear.  NECK:  Supple, no jugular venous distention. No thyroid enlargement, no tenderness.  LUNGS: Normal breath sounds  bilaterally, no wheezing, rales,rhonchi or crepitation. No use of accessory muscles of respiration.  CARDIOVASCULAR: Regular rate and rhythm, S1, S2 normal.  2/6 systolic ejection murmur at the left lower sternal border with no rubs, or gallops.  ABDOMEN: Soft, nondistended, nontender. Bowel sounds present. No organomegaly or mass.  EXTREMITIES: No pedal edema, cyanosis, or clubbing.  NEUROLOGIC: Cranial nerves II through XII are intact. Muscle strength 5/5 in all extremities. Sensation intact. Gait not checked.  PSYCHIATRIC: The patient is alert and oriented x 3.  Normal affect and good eye contact. SKIN: No obvious rash, lesion, or ulcer.   LABORATORY PANEL:   CBC Recent Labs  Lab 01/25/22 2123  WBC 6.3  HGB 5.1*  HCT 17.0*  PLT 451*   ------------------------------------------------------------------------------------------------------------------  Chemistries  Recent Labs  Lab 01/25/22 2123  NA 140  K 3.3*  CL 108  CO2 25  GLUCOSE 110*  BUN 21  CREATININE 0.97  CALCIUM 8.9  AST 15  ALT 13  ALKPHOS 30*  BILITOT 0.5   ------------------------------------------------------------------------------------------------------------------  Cardiac Enzymes No results for input(s): "TROPONINI" in the last 168 hours. ------------------------------------------------------------------------------------------------------------------  RADIOLOGY:  CT Angio Abd/Pel W and/or Wo Contrast  Result Date: 01/26/2022 CLINICAL DATA:  Lower GI bleed with dizziness, weakness and rectal bleeding. EXAM: CTA ABDOMEN AND PELVIS WITHOUT AND WITH CONTRAST TECHNIQUE: Multidetector CT imaging of the abdomen and pelvis was performed using the standard protocol during bolus administration of intravenous contrast. Multiplanar reconstructed images and MIPs were obtained and reviewed to evaluate the vascular anatomy. RADIATION DOSE REDUCTION: This exam was performed according to the departmental  dose-optimization program which includes automated exposure control, adjustment of the mA and/or kV according to patient size and/or use of iterative reconstruction technique. CONTRAST:  150m OMNIPAQUE IOHEXOL 350 MG/ML SOLN COMPARISON:  None Available. FINDINGS: VASCULAR Aorta: There is moderately heavy calcific wall plaque without stenosis, aneurysm or dissection. Celiac: There are ostial calcific plaques and compression by the median arcuate ligament of the diaphragm causing no more than 30% origin stenosis. The vessel is otherwise widely patent. SMA: There are ostial calcifications, without stenosis, dissection or aneurysm. Renals: Both renal arteries are single. There are bilateral nonstenosing ostial calcifications. Both vessels are widely patent. There are asymmetric renovascular calcifications at the right greater than left renal hila. IMA: Patent without evidence of aneurysm, dissection, vasculitis or significant stenosis. Inflow: Moderate to heavy calcific plaques involve both common femoral arteries, without flow-limiting stenosis. There is about 30% stenosis of both vessels. There are high-grade mixed plaque origin stenoses of both internal iliac arteries, with mild to moderate irregular calcific stenosis with the remainder. There are mild scattered calcifications in the external iliac arteries but neither of these arteries has a flow-limiting stenosis, aneurysm or dissection. Proximal Outflow: There are moderate calcifications in both common femoral arteries and both proximal superficial femoral arteries, without flow-limiting stenosis. Both deep femoral arteries show scattered non stenosing plaques without focal stenosis. The circumflex femoral arteries appear clear. Veins: Patent. This includes the pelvic deep veins and proximal common femoral veins. Review of the MIP images confirms the above findings. NON-VASCULAR Lower chest: Mild cardiomegaly. Minimal pericardial effusion. The intraventricular  blood pool is low in density on the noncontrast exam consistent with anemia. There is a nonspecific mildly prominent right infrahilar lymph node of 1.5 cm short axis. There is a 6 mm noncalcified left lower lobe medial basal nodule on series 13 axial 32, and an 8 mm noncalcified nodule in the posteromedial left lower lobe on 13:11. There are few linear scar-like opacities in both bases and slightly elevated right hemidiaphragm. Hepatobiliary: No focal liver abnormality is seen. No calcified gallstones, gallbladder wall thickening, or biliary dilatation. Pancreas: No focal abnormality. Spleen: No focal abnormality.  No splenomegaly. Adrenals/Urinary Tract: On all 3 study phases there is periadrenal stranding but no significant perinephric stranding. There are few tiny right renal cortical hypodensities which are too small to characterize.No follow-up imaging is recommended. JACR 2018 Feb; 264-273, Management of the Incidental RenalMass on CT, [employer] 2021; 814-848, Bosniak Classification of Cystic Renal Masses, Version 2019. There is no adrenal mass. Unremarkable left renal cortex. There is no urinary stone or obstruction. There is no bladder thickening. Stomach/Bowel: Small hiatal hernia. No dilatation or wall thickening including the appendix. Scattered uncomplicated colonic diverticula noted, mild fecal stasis ascending colon. No contrast extravasation into the bowel is seen. Lymphatic: No appreciable adenopathy. Reproductive: Status post hysterectomy. No adnexal  masses. Other: Small umbilical fat hernia. No free air, free hemorrhage or free fluid. No incarcerated hernia. Musculoskeletal: Advanced degenerative disc collapse L5-S1 is noted with grade 1 L5-S1 retrolisthesis. There is a heterogeneous slightly expansile intramedullary marrow lesion in the anterior column of the right acetabulum which measures 2.9 x 1.8 x 3.9 cm, with a well-defined sclerotic border and a complex matrix, but with overall  nonaggressive appearance. There is grade 1 anterolisthesis at L4-5 due to advanced facet hypertrophy at this level, with acquired spinal canal stenosis. Foraminal stenosis is seen in L4-5 and especially severely L5-S1. IMPRESSION: VASCULAR 1. Aortic and branch vessel atherosclerosis but no flow-limiting visceral artery stenoses. 2. Inflow stenoses are seen but only in the internal iliac arteries and not in the external iliac arteries, with no significant proximal outflow stenosis. 3. Cardiomegaly with low density of the cardiac blood pool on unenhanced scanning consistent with anemia. NON-VASCULAR 1. Nonspecific mildly enlarged right hilar lymph node and 2 left lower lobe nodules measuring 6 mm and 8 mm. Per Fleischner Society Guidelines, recommend a non-contrast Chest CT at 3-6 months. If patient is high risk for malignancy, recommend an additional non-contrast Chest CT at 18-24 months; if patient is low risk for malignancy a non-contrast Chest CT at 18-24 months is optional. These guidelines do not apply to immunocompromised patients and patients with cancer. Follow up in patients with significant comorbidities as clinically warranted. For lung cancer screening, adhere to Lung-RADS guidelines. Reference: Radiology. 2017; 284(1):228-43. 2. Bilateral periadrenal stranding on all 3 phases of the study. This may be seen with adrenal congestion and may precede nontraumatic adrenal hemorrhage although adrenal no hemorrhage is currently seen. Close clinical follow-up with evaluation of adrenal function is recommended to determine of exogenous steroid treatment is needed, with repeat adrenal function tests as needed. 3. Scattered uncomplicated diverticulosis. 4. Heterogeneous slightly expansile lesion in the anterior column of the right acetabulum measuring 2.9 x 1.8 x 3.9 cm but with a nonaggressive appearance and a well-defined sclerotic border. Nonemergent follow-up MRI without and with contrast recommended.  Electronically Signed   By: Telford Nab M.D.   On: 01/26/2022 03:10      IMPRESSION AND PLAN:  Assessment and Plan: * GI bleeding - The patient admitted to a program stated bed. - She was typed and crossmatched will be transfused 2 units of packed red blood cells. - We will follow posttransfusion H&H. - We will place on IV Protonix with bolus and infusion. - The patient will be hydrated with IV normal saline. -GI consultation will be obtained. - We will notify Dr. Vicente Males about the patient.  Acute post-hemorrhagic anemia - The patient be placed on IV PPI therapy and transfuse with 2 units of packed red blood cells. - We will follow posttransfusion H&H. - GI consultation will be obtained as mentioned above.  Hypokalemia - Potassium will be replaced and magnesium level will be checked.  Essential hypertension - We will continue her antihypertensives.  Restless leg syndrome - We will continue ropinirole.  Chronic obstructive pulmonary disease (COPD) ([facility]) - We will continue her inhalers  Dyslipidemia - We will continue statin therapy.   DVT prophylaxis: Lovenox. Advanced Care Planning:  Code Status: full code. Family Communication:  The plan of care was discussed in details with the patient (and family). I answered all questions. The patient agreed to proceed with the above mentioned plan. Further management will depend upon hospital course. Disposition Plan: Back to previous home environment Consults called: Yes neurology All  the records are reviewed and case discussed with ED provider.  Status is: Inpatient   At the time of the admission, it appears that the appropriate admission status for this patient is inpatient.  This is judged to be reasonable and necessary in order to provide the required intensity of service to ensure the patient's safety given the presenting symptoms, physical exam findings and initial radiographic and laboratory data in the context of comorbid  conditions.  The patient requires inpatient status due to high intensity of service, high risk of further deterioration and high frequency of surveillance required.  I certify that at the time of admission, it is my clinical judgment that the patient will require inpatient hospital care extending more than 2 midnights.                            Dispo: The patient is from: Home              Anticipated d/c is to: Home              Patient currently is not medically stable to d/c.              Difficult to place patient: No  Christel Mormon M.D on 01/26/2022 at 4:45 AM  [facility]   From 7 PM-7 AM, contact night-coverage [email or web address]  CC: Primary care physician; Letta Median, MD

## 2022-01-27 ENCOUNTER — Inpatient Hospital Stay: Payer: Medicare Other | Admitting: Certified Registered Nurse Anesthetist

## 2022-01-27 ENCOUNTER — Encounter
Admission: EM | Disposition: A | Discharge status: Home / Self Care | Payer: Self-pay | Source: Home / Self Care | Attending: Internal Medicine

## 2022-01-27 DIAGNOSIS — D500 Iron deficiency anemia secondary to blood loss (chronic): Secondary | ICD-10-CM

## 2022-01-27 DIAGNOSIS — D509 Iron deficiency anemia, unspecified: Secondary | ICD-10-CM

## 2022-01-27 DIAGNOSIS — D62 Acute posthemorrhagic anemia: Secondary | ICD-10-CM | POA: Diagnosis not present

## 2022-01-27 DIAGNOSIS — D649 Anemia, unspecified: Secondary | ICD-10-CM

## 2022-01-27 DIAGNOSIS — K921 Melena: Secondary | ICD-10-CM | POA: Diagnosis not present

## 2022-01-27 HISTORY — PX: ESOPHAGOGASTRODUODENOSCOPY (EGD) WITH PROPOFOL: SHX5813

## 2022-01-27 HISTORY — PX: COLONOSCOPY WITH PROPOFOL: SHX5780

## 2022-01-27 LAB — BASIC METABOLIC PANEL
Anion gap: 4 — ABNORMAL LOW (ref 5–15)
BUN: 10 mg/dL (ref 8–23)
CO2: 25 mmol/L (ref 22–32)
Calcium: 8.5 mg/dL — ABNORMAL LOW (ref 8.9–10.3)
Chloride: 112 mmol/L — ABNORMAL HIGH (ref 98–111)
Creatinine, Ser: 0.72 mg/dL (ref 0.44–1.00)
GFR, Estimated: >60 mL/min (ref >60)
Glucose, Bld: 86 mg/dL (ref 70–99)
Potassium: 3.7 mmol/L (ref 3.5–5.1)
Sodium: 141 mmol/L (ref 135–145)

## 2022-01-27 LAB — MAGNESIUM: Magnesium: 2.1 mg/dL (ref 1.7–2.4)

## 2022-01-27 LAB — HEMOGLOBIN AND HEMATOCRIT, BLOOD
HCT: 21.7 % — ABNORMAL LOW (ref 36.0–46.0)
HCT: 28.4 % — ABNORMAL LOW (ref 36.0–46.0)
HCT: 29.1 % — ABNORMAL LOW (ref 36.0–46.0)
HCT: 29.1 % — ABNORMAL LOW (ref 36.0–46.0)
Hemoglobin: 6.6 g/dL — ABNORMAL LOW (ref 12.0–15.0)
Hemoglobin: 9.1 g/dL — ABNORMAL LOW (ref 12.0–15.0)
Hemoglobin: 9.2 g/dL — ABNORMAL LOW (ref 12.0–15.0)
Hemoglobin: 9.3 g/dL — ABNORMAL LOW (ref 12.0–15.0)

## 2022-01-27 LAB — CORTISOL-AM, BLOOD: Cortisol - AM: 7.5 ug/dL (ref 6.7–22.6)

## 2022-01-27 LAB — PREPARE RBC (CROSSMATCH)

## 2022-01-27 LAB — VITAMIN B12: Vitamin B-12: 3623 pg/mL — ABNORMAL HIGH (ref 180–914)

## 2022-01-27 SURGERY — ESOPHAGOGASTRODUODENOSCOPY (EGD) WITH PROPOFOL
Anesthesia: General

## 2022-01-27 MED ORDER — SODIUM CHLORIDE 0.9 % IV SOLN
INTRAVENOUS | Status: DC
Start: 2022-01-27 — End: 2022-01-27

## 2022-01-27 MED ORDER — ACETAMINOPHEN 325 MG PO TABS: 650.0000 mg | ORAL_TABLET | Freq: Once | ORAL | Status: DC

## 2022-01-27 MED ORDER — PROPOFOL 500 MG/50ML IV EMUL
INTRAVENOUS | Status: DC | PRN
  Administered 2022-01-27: 150 ug/kg/min via INTRAVENOUS

## 2022-01-27 MED ORDER — SODIUM CHLORIDE 0.9% IV SOLUTION: Freq: Once | INTRAVENOUS | Status: DC

## 2022-01-27 MED ORDER — DIPHENHYDRAMINE HCL 25 MG PO CAPS: 25.0000 mg | ORAL_CAPSULE | Freq: Once | ORAL | Status: DC

## 2022-01-27 MED ORDER — LIDOCAINE HCL (CARDIAC) PF 100 MG/5ML IV SOSY
PREFILLED_SYRINGE | INTRAVENOUS | Status: DC | PRN
  Administered 2022-01-27: 50 mg via INTRAVENOUS

## 2022-01-27 MED ORDER — PROPOFOL 10 MG/ML IV BOLUS
INTRAVENOUS | Status: DC | PRN
  Administered 2022-01-27: 80 mg via INTRAVENOUS

## 2022-01-27 NOTE — Progress Notes (Signed)
  Progress Note   Patient: Hailey Farrell ACZ:[id number] DOB: 1950/09/01 DOA: 01/25/2022     1 DOS: the patient was seen and examined on 01/27/2022   Brief hospital course: DOMINIQUE RESSEL is a 71 y.o. African-American female with medical history significant for COPD, hypertension, dyslipidemia, migraine and restless leg syndrome as well as osteoarthritis, who presented to the emergency room with acute onset of melena for the last couple of weeks with associated generalized weakness and dizziness. Her hemoglobin was 5.1, she was given 2 units of PRBC, hemoglobin increased to 7.0.  Patient is also given IV iron and IV Protonix.  GI consult obtained.  8/25.  EGD and colonoscopy did not show any evidence of bleed.  Capsule endoscopy started.  Received 2 more units of blood transfusion for hemoglobin 6.6.  Assessment and Plan: GI bleeding Acute post-hemorrhagic anemia Patient so far has received 4 units of PRBC transfusion.  Continue to monitor hemoglobin.  Patient also received IV iron, B12 level adequate. EGD/colonoscopy did not show any evidence of bleeding.  Capsule endoscopy will be started today.   Hypokalemia Normalized.   Essential hypertension Continue to monitor.   Restless leg syndrome ropinirole.   Chronic obstructive pulmonary disease (COPD) ([facility]) Stable.   Dyslipidemia continue statin therapy.   Internal iliac artery stenosis. Incidental finding on CT scan.  Patient has no evidence of lower extremity ischemia.  Patient will follow up with vascular surgery as outpatient.   Intra-abdominal lymphadenopathy. We will refer patient to oncology follow-up after discharge.   Adrenal gland stranding on CT scan. Check a.m. cortisol level to rule out adrenal insufficiency.   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 An incidental finding on CT scan, will be followed by oncology as outpatient.      Subjective:  Patient had more black stools last night, hemoglobin dropped down to 6.6.   Patient was dizzy and lightheaded.  He received 2 units of PRBC.  Physical Exam: Vitals:   01/27/22 0943 01/27/22 0944 01/27/22 1017 01/27/22 1128  BP:  (!) 142/66 (!) 144/77 (!) 110/50  Pulse:  79 86 87  Resp:  '18 18 16  '$ Temp:  98 F (36.7 C) (!) 96.7 F (35.9 C) (!) 96.8 F (36 C)  TempSrc: Oral Oral Temporal Temporal  SpO2:  99% 97% 98%  Weight:      Height:       General exam: Appears calm and comfortable  Respiratory system: Clear to auscultation. Respiratory effort normal. Cardiovascular system: S1 & S2 heard, RRR. No JVD, murmurs, rubs, gallops or clicks. No pedal edema. Gastrointestinal system: Abdomen is nondistended, soft and nontender. No organomegaly or masses felt. Normal bowel sounds heard. Central nervous system: Alert and oriented. No focal neurological deficits. Extremities: Symmetric 5 x 5 power. Skin: No rashes, lesions or ulcers Psychiatry: Judgement and insight appear normal. Mood & affect appropriate.   Data Reviewed:  Lab results reviewed  Family Communication: Husband at the bedside.  Disposition: Status is: Inpatient Remains inpatient appropriate because: Severity of disease, inpatient procedure.  Planned Discharge Destination: Home with Home Health    Time spent: 35 minutes  Author: Sharen Hones, MD 01/27/2022 11:50 AM  For on call review [email or web address].

## 2022-01-27 NOTE — Op Note (Signed)
[facility] Gastroenterology Patient Name: Keyonta Madrid Procedure Date: 01/27/2022 10:49 AM MRN: [id number] Account #: [id number] Date of Birth: [birth year] Admit Type: Inpatient Age: 71 Room: [facility] ENDO ROOM 4 Gender: Female Note Status: Finalized Instrument Name: Peds Colonoscope [id number] Procedure:             Colonoscopy Indications:           Iron deficiency anemia Providers:             [name] MD, MD Referring MD:          Baxter Kail. Rebeca Alert MD, MD (Referring MD) Medicines:             Monitored Anesthesia Care Complications:         No immediate complications. Procedure:             Pre-Anesthesia Assessment:                        - Prior to the procedure, a History and Physical was                         performed, and patient medications, allergies and                         sensitivities were reviewed. The patient's tolerance                         of previous anesthesia was reviewed.                        - The risks and benefits of the procedure and the                         sedation options and risks were discussed with the                         patient. All questions were answered and informed                         consent was obtained.                        - ASA Grade Assessment: III - A patient with severe                         systemic disease.                        After obtaining informed consent, the colonoscope was                         passed under direct vision. Throughout the procedure,                         the patient's blood pressure, pulse, and oxygen                         saturations were monitored continuously. The                         Colonoscope was  introduced through the anus and                         advanced to the the cecum, identified by the                         appendiceal orifice. The colonoscopy was performed                         with ease. The patient tolerated the procedure well.                          The quality of the bowel preparation was adequate. Findings:      The perianal and digital rectal examinations were normal.      The entire examined colon appeared normal on direct and retroflexion       views. Impression:            - The entire examined colon is normal on direct and                         retroflexion views.                        - No specimens collected. Recommendation:        - Return patient to hospital ward for ongoing care.                        - NPO for 4 hours.                        - To visualize the small bowel, perform video capsule                         endoscopy today. Procedure Code(s):     --- Professional ---                        [phone], Colonoscopy, flexible; diagnostic, including                         collection of specimen(s) by brushing or washing, when                         performed (separate procedure) Diagnosis Code(s):     --- Professional ---                        D50.9, Iron deficiency anemia, unspecified CPT copyright 2019 American Medical Association. All rights reserved. The codes documented in this report are preliminary and upon coder review may  be revised to meet current compliance requirements. [name], MD [name] MD, MD 01/27/2022 11:23:36 AM This report has been signed electronically. Number of Addenda: 0 Note Initiated On: 01/27/2022 10:49 AM Total Procedure Duration: 0 hours 0 minutes 23 seconds  Estimated Blood Loss:  Estimated blood loss: none.      [facility]

## 2022-01-27 NOTE — Anesthesia Procedure Notes (Signed)
Date/Time: 01/27/2022 10:58 AM  Performed by: Johnna Acosta, CRNAPre-anesthesia Checklist: Patient identified, Emergency Drugs available, Suction available, Patient being monitored and Timeout performed Patient Re-evaluated:Patient Re-evaluated prior to induction Oxygen Delivery Method: Nasal cannula Preoxygenation: Pre-oxygenation with 100% oxygen Induction Type: IV induction

## 2022-01-27 NOTE — Transfer of Care (Signed)
Immediate Anesthesia Transfer of Care Note  Patient: Hailey Farrell  Procedure(s) Performed: ESOPHAGOGASTRODUODENOSCOPY (EGD) WITH PROPOFOL COLONOSCOPY WITH PROPOFOL  Patient Location: PACU  Anesthesia Type:General  Level of Consciousness: sedated  Airway & Oxygen Therapy: Patient Spontanous Breathing  Post-op Assessment: Report given to RN and Post -op Vital signs reviewed and stable  Post vital signs: Reviewed and stable  Last Vitals:  Vitals Value Taken Time  BP    Temp    Pulse    Resp    SpO2      Last Pain:  Vitals:   01/27/22 1128  TempSrc:   PainSc: Asleep         Complications: No notable events documented.

## 2022-01-27 NOTE — Progress Notes (Signed)
2nd unit PRBCs started 0945 am, patient transferred to Endo accompanied by charge nurse Janett Billow RN. Next vitals due 10 am.  Hollie Salk, RN

## 2022-01-27 NOTE — Anesthesia Preprocedure Evaluation (Signed)
Anesthesia Evaluation  Patient identified by MRN, date of birth, ID band Patient awake    Reviewed: Allergy & Precautions, NPO status , Patient's Chart, lab work & pertinent test results  History of Anesthesia Complications Negative for: history of anesthetic complications  Airway Mallampati: III  TM Distance: >3 FB Neck ROM: full    Dental  (+) Poor Dentition, Dental Advidsory Given   Pulmonary neg shortness of breath, neg sleep apnea, COPD,  COPD inhaler, Current Smoker,    Pulmonary exam normal        Cardiovascular hypertension, (-) angina(-) Past MI and (-) CABG negative cardio ROS Normal cardiovascular exam     Neuro/Psych negative neurological ROS  negative psych ROS   GI/Hepatic negative GI ROS, Neg liver ROS,   Endo/Other  negative endocrine ROS  Renal/GU negative Renal ROS  negative genitourinary   Musculoskeletal   Abdominal   Peds  Hematology negative hematology ROS (+)   Anesthesia Other Findings Past Medical History: No date: Anemia No date: Arthritis 1990: Breast cancer ([place])     Comment:  Right No date: COPD (chronic obstructive pulmonary disease) (HCC) No date: Dyspnea     Comment:  DOE No date: Elevated lipids No date: Hypertension No date: Lower extremity edema No date: Migraines No date: Migraines 1990: Personal history of radiation therapy     Comment:  Breast No date: Restless leg syndrome No date: Rotator cuff injury  Past Surgical History: No date: ABDOMINAL HYSTERECTOMY 08/2017: BACK SURGERY     Comment:  CERVICAL FUSION 1990: BREAST BIOPSY; Right     Comment:  LUMPECTOMY.  took lymph nodes No date: BREAST LUMPECTOMY 07/20/2017: COLONOSCOPY WITH PROPOFOL; N/A     Comment:  Procedure: COLONOSCOPY WITH PROPOFOL;  Surgeon:               Virgel Manifold, MD;  Location: [facility] ENDOSCOPY;                Service: Endoscopy;  Laterality: N/A; 08/08/2017: ENTEROSCOPY; N/A      Comment:  Procedure: ENTEROSCOPY;  Surgeon: Lin Landsman,               MD;  Location: [facility] ENDOSCOPY;  Service:               Gastroenterology;  Laterality: N/A; 05/07/2020: ENTEROSCOPY; N/A     Comment:  Procedure: ENTEROSCOPY with Adult colonoscope;  Surgeon:              Virgel Manifold, MD;  Location: [facility] ENDOSCOPY;                Service: Endoscopy;  Laterality: N/A; 03/17/2021: ENTEROSCOPY; N/A     Comment:  Procedure: ENTEROSCOPY;  Surgeon: Virgel Manifold,               MD;  Location: [facility] ENDOSCOPY;  Service: Endoscopy;                Laterality: N/A;  PUSH 07/13/2021: ENTEROSCOPY; N/A     Comment:  Procedure: ENTEROSCOPY;  Surgeon: Lin Landsman,               MD;  Location: [facility] ENDOSCOPY;  Service:               Gastroenterology;  Laterality: N/A; 10/26/2021: ENTEROSCOPY; N/A     Comment:  Procedure: ENTEROSCOPY;  Surgeon: Lin Landsman,               MD;  Location:  [facility] ENDOSCOPY;  Service:               Gastroenterology;  Laterality: N/A;  push 04/13/2017: ESOPHAGOGASTRODUODENOSCOPY; Left     Comment:  Procedure: ESOPHAGOGASTRODUODENOSCOPY (EGD);  Surgeon:               Virgel Manifold, MD;  Location: [facility] ENDOSCOPY;                Service: Endoscopy;  Laterality: Left; 07/20/2017: ESOPHAGOGASTRODUODENOSCOPY (EGD) WITH PROPOFOL; N/A     Comment:  Procedure: ESOPHAGOGASTRODUODENOSCOPY (EGD) WITH               PROPOFOL;  Surgeon: Virgel Manifold, MD;  Location:               [facility] ENDOSCOPY;  Service: Endoscopy;  Laterality: N/A; 12/12/2017: ESOPHAGOGASTRODUODENOSCOPY (EGD) WITH PROPOFOL; N/A     Comment:  Procedure: ESOPHAGOGASTRODUODENOSCOPY (EGD) WITH               PROPOFOL;  Surgeon: Virgel Manifold, MD;  Location:               [facility] ENDOSCOPY;  Service: Endoscopy;  Laterality: N/A; 12/23/2020: ESOPHAGOGASTRODUODENOSCOPY (EGD) WITH PROPOFOL; N/A     Comment:  Procedure: ESOPHAGOGASTRODUODENOSCOPY (EGD) WITH                PROPOFOL;  Surgeon: Virgel Manifold, MD;  Location:               [facility] ENDOSCOPY;  Service: Endoscopy;  Laterality: N/A; 03/10/2021: GIVENS CAPSULE STUDY; N/A     Comment:  Procedure: GIVENS CAPSULE STUDY;  Surgeon: Virgel Manifold, MD;  Location: [facility] ENDOSCOPY;  Service:               Endoscopy;  Laterality: N/A; 1990: MEDIAL PARTIAL KNEE REPLACEMENT; Left     Comment:  torn ligament. no knee replacement at that time No date: PARTIAL HYSTERECTOMY 03/01/2016: SHOULDER ARTHROSCOPY WITH OPEN ROTATOR CUFF REPAIR; Left     Comment:  Procedure: SHOULDER ARTHROSCOPY WITH OPEN ROTATOR CUFF               REPAIR;  Surgeon: Thornton Park, MD;  Location: [facility]               ORS;  Service: Orthopedics;  Laterality: Left; 02/14/2018: TOTAL KNEE ARTHROPLASTY; Left     Comment:  Procedure: TOTAL KNEE ARTHROPLASTY;  Surgeon: Thornton Park, MD;  Location: [facility] ORS;  Service: Orthopedics;                Laterality: Left;  BMI    Body Mass Index: 31.58 kg/m      Reproductive/Obstetrics negative OB ROS                             Anesthesia Physical Anesthesia Plan  ASA: 3  Anesthesia Plan: General   Post-op Pain Management:    Induction: Intravenous  PONV Risk Score and Plan: Propofol infusion and TIVA  Airway Management Planned: Natural Airway and Nasal Cannula  Additional Equipment:   Intra-op Plan:   Post-operative Plan:   Informed Consent: I have reviewed the patients History and Physical, chart, labs and discussed the procedure including the risks, benefits and alternatives for the proposed anesthesia with the patient or  authorized representative who has indicated his/her understanding and acceptance.     Dental Advisory Given  Plan Discussed with: Anesthesiologist, CRNA and Surgeon  Anesthesia Plan Comments: (Patient consented for risks of anesthesia including but not limited to:  - adverse reactions to  medications - risk of airway placement if required - damage to eyes, teeth, lips or other oral mucosa - nerve damage due to positioning  - sore throat or hoarseness - Damage to heart, brain, nerves, lungs, other parts of body or loss of life  Patient voiced understanding.)        Anesthesia Quick Evaluation

## 2022-01-27 NOTE — H&P (Signed)
Hailey Bellows, MD [address], [place], [place], [place], [address] [address], [place], [place], [place], [address] Phone: [phone]  Fax: [phone]  Primary Care Physician:  Letta Median, MD   Pre-Procedure History & Physical: HPI:  Hailey Farrell is a 71 y.o. female is here for an endoscopy and colonoscopy    Past Medical History:  Diagnosis Date   Anemia    Arthritis    Breast cancer ([place]) 1990   Right   COPD (chronic obstructive pulmonary disease) ([place])    Dyspnea    DOE   Elevated lipids    Hypertension    Lower extremity edema    Migraines    Migraines    Personal history of radiation therapy 1990   Breast   Restless leg syndrome    Rotator cuff injury     Past Surgical History:  Procedure Laterality Date   ABDOMINAL HYSTERECTOMY     BACK SURGERY  08/2017   CERVICAL FUSION   BREAST BIOPSY Right 1990   LUMPECTOMY.  took lymph nodes   BREAST LUMPECTOMY     COLONOSCOPY WITH PROPOFOL N/A 07/20/2017   Procedure: COLONOSCOPY WITH PROPOFOL;  Surgeon: Virgel Manifold, MD;  Location: [facility] ENDOSCOPY;  Service: Endoscopy;  Laterality: N/A;   ENTEROSCOPY N/A 08/08/2017   Procedure: ENTEROSCOPY;  Surgeon: Lin Landsman, MD;  Location: [facility] ENDOSCOPY;  Service: Gastroenterology;  Laterality: N/A;   ENTEROSCOPY N/A 05/07/2020   Procedure: ENTEROSCOPY with Adult colonoscope;  Surgeon: Virgel Manifold, MD;  Location: [facility] ENDOSCOPY;  Service: Endoscopy;  Laterality: N/A;   ENTEROSCOPY N/A 03/17/2021   Procedure: ENTEROSCOPY;  Surgeon: Virgel Manifold, MD;  Location: [facility] ENDOSCOPY;  Service: Endoscopy;  Laterality: N/A;  PUSH   ENTEROSCOPY N/A 07/13/2021   Procedure: ENTEROSCOPY;  Surgeon: Lin Landsman, MD;  Location: [facility] ENDOSCOPY;  Service: Gastroenterology;  Laterality: N/A;   ENTEROSCOPY N/A 10/26/2021   Procedure: ENTEROSCOPY;  Surgeon: Lin Landsman, MD;  Location: [facility] ENDOSCOPY;  Service: Gastroenterology;   Laterality: N/A;  push   ESOPHAGOGASTRODUODENOSCOPY Left 04/13/2017   Procedure: ESOPHAGOGASTRODUODENOSCOPY (EGD);  Surgeon: Virgel Manifold, MD;  Location: [facility] ENDOSCOPY;  Service: Endoscopy;  Laterality: Left;   ESOPHAGOGASTRODUODENOSCOPY (EGD) WITH PROPOFOL N/A 07/20/2017   Procedure: ESOPHAGOGASTRODUODENOSCOPY (EGD) WITH PROPOFOL;  Surgeon: Virgel Manifold, MD;  Location: [facility] ENDOSCOPY;  Service: Endoscopy;  Laterality: N/A;   ESOPHAGOGASTRODUODENOSCOPY (EGD) WITH PROPOFOL N/A 12/12/2017   Procedure: ESOPHAGOGASTRODUODENOSCOPY (EGD) WITH PROPOFOL;  Surgeon: Virgel Manifold, MD;  Location: [facility] ENDOSCOPY;  Service: Endoscopy;  Laterality: N/A;   ESOPHAGOGASTRODUODENOSCOPY (EGD) WITH PROPOFOL N/A 12/23/2020   Procedure: ESOPHAGOGASTRODUODENOSCOPY (EGD) WITH PROPOFOL;  Surgeon: Virgel Manifold, MD;  Location: [facility] ENDOSCOPY;  Service: Endoscopy;  Laterality: N/A;   GIVENS CAPSULE STUDY N/A 03/10/2021   Procedure: GIVENS CAPSULE STUDY;  Surgeon: Virgel Manifold, MD;  Location: [facility] ENDOSCOPY;  Service: Endoscopy;  Laterality: N/A;   MEDIAL PARTIAL KNEE REPLACEMENT Left 1990   torn ligament. no knee replacement at that time   PARTIAL HYSTERECTOMY     SHOULDER ARTHROSCOPY WITH OPEN ROTATOR CUFF REPAIR Left 03/01/2016   Procedure: SHOULDER ARTHROSCOPY WITH OPEN ROTATOR CUFF REPAIR;  Surgeon: Thornton Park, MD;  Location: [facility] ORS;  Service: Orthopedics;  Laterality: Left;   TOTAL KNEE ARTHROPLASTY Left 02/14/2018   Procedure: TOTAL KNEE ARTHROPLASTY;  Surgeon: Thornton Park, MD;  Location: [facility] ORS;  Service: Orthopedics;  Laterality: Left;    Prior to Admission medications  Medication Sig Start Date End Date Taking? Authorizing Provider  acetaminophen (TYLENOL) 650 MG CR tablet Take 650 mg by mouth every 8 (eight) hours as needed for pain.   Yes [provider]  budesonide-formoterol (SYMBICORT) 160-4.5 MCG/ACT inhaler Inhale 2 puffs into the lungs 2  (two) times daily.   Yes [provider]  furosemide (LASIX) 20 MG tablet Take 20 mg daily by mouth.    Yes [provider]  lisinopril (PRINIVIL,ZESTRIL) 20 MG tablet Take 20 mg by mouth daily.   Yes [provider]  potassium chloride (MICRO-K) 10 MEQ CR capsule Take 10 mEq by mouth daily.   Yes [provider]  tiotropium (SPIRIVA) 18 MCG inhalation capsule Place 1 capsule (18 mcg total) into inhaler and inhale daily. 07/14/21  Yes Sharen Hones, MD  albuterol (VENTOLIN HFA) 108 (90 Base) MCG/ACT inhaler Inhale 2 puffs into the lungs every 6 (six) hours as needed for wheezing.    [provider]  amitriptyline (ELAVIL) 25 MG tablet Take 25 mg by mouth at bedtime. 04/13/20   [provider]  diphenhydramine-acetaminophen (TYLENOL PM) 25-500 MG TABS tablet Take 1-2 tablets by mouth at bedtime as needed (pain).    [provider]  rOPINIRole (REQUIP) 2 MG tablet Take 2-4 mg by mouth at bedtime as needed (restless leg syndrome).    [provider]  simvastatin (ZOCOR) 20 MG tablet Take 20 mg by mouth daily.     [provider]  vitamin B-12 (CYANOCOBALAMIN) 1000 MCG tablet Take 1,000 mcg by mouth daily.    [provider]    Allergies as of 01/25/2022 - Review Complete 01/25/2022  Allergen Reaction Noted   Latex Rash 02/03/2015    Family History  Problem Relation Age of Onset   CAD Mother    CAD Sister    Breast cancer Neg Hx     Social History   Socioeconomic History   Marital status: Married    Spouse name: Fritz Pickerel   Number of children: Not on file   Years of education: Not on file   Highest education level: Not on file  Occupational History   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  Tobacco Use   Smoking status: Some Days    Packs/day: 0.25    Years: 50.00    Total pack years: 12.50    Types: Cigarettes    Last attempt to quit: 03/13/2016    Years since quitting: 5.8   Smokeless  tobacco: Never  Vaping Use   Vaping Use: Never used  Substance and Sexual Activity   Alcohol use: No   Drug use: No   Sexual activity: Not on file  Other Topics Concern   Not on file  Social History Narrative   Not on file   Social Determinants of Health   Financial Resource Strain: Not on file  Food Insecurity: Not on file  Transportation Needs: Not on file  Physical Activity: Not on file  Stress: Not on file  Social Connections: Not on file  Intimate Partner Violence: Not on file    Review of Systems: See HPI, otherwise negative ROS  Physical Exam: BP (!) 144/77   Pulse 86   Temp (!) 96.7 F (35.9 C) (Temporal)   Resp 18   Ht '5\' 4"'$  (1.626 m)   Wt 83.5 kg   SpO2 97%   BMI 31.58 kg/m  General:   Alert,  pleasant and cooperative in NAD Head:  Normocephalic and  atraumatic. Neck:  Supple; no masses or thyromegaly. Lungs:  Clear throughout to auscultation, normal respiratory effort.    Heart:  +S1, +S2, Regular rate and rhythm, No edema. Abdomen:  Soft, nontender and nondistended. Normal bowel sounds, without guarding, and without rebound.   Neurologic:  Alert and  oriented x4;  grossly normal neurologically.  Impression/Plan: Hailey Farrell is here for an push enteroscopy  and colonoscopy  to be performed for  evaluation of melena    Risks, benefits, limitations, and alternatives regarding endoscopy have been reviewed with the patient.  Questions have been answered.  All parties agreeable.   Hailey Bellows, MD  01/27/2022, 10:48 AM

## 2022-01-27 NOTE — Op Note (Signed)
[facility] Gastroenterology Patient Name: Hailey Farrell Procedure Date: 01/27/2022 10:50 AM MRN: [id number] Account #: [id number] Date of Birth: Oct 14, 1950 Admit Type: Inpatient Age: 71 Room: [facility] ENDO ROOM 4 Gender: Female Note Status: Finalized Instrument Name: Peds Colonoscope [id number] Procedure:             Small bowel enteroscopy Indications:           Iron deficiency anemia Providers:             [name] MD, MD Referring MD:          Baxter Kail. Rebeca Alert MD, MD (Referring MD) Medicines:             Monitored Anesthesia Care Complications:         No immediate complications. Procedure:             Pre-Anesthesia Assessment:                        - Prior to the procedure, a History and Physical was                         performed, and patient medications, allergies and                         sensitivities were reviewed. The patient's tolerance                         of previous anesthesia was reviewed.                        - The risks and benefits of the procedure and the                         sedation options and risks were discussed with the                         patient. All questions were answered and informed                         consent was obtained.                        - The risks and benefits of the procedure and the                         sedation options and risks were discussed with the                         patient. All questions were answered and informed                         consent was obtained.                        - ASA Grade Assessment: III - A patient with severe                         systemic disease.  After obtaining informed consent, the endoscope was                         passed under direct vision. Throughout the procedure,                         the patient's blood pressure, pulse, and oxygen                         saturations were monitored continuously. The                          Colonoscope was introduced through the mouth, and                         advanced to the proximal jejunum. The small bowel                         enteroscopy was accomplished with ease. The patient                         tolerated the procedure well. Findings:      The esophagus was normal.      The stomach was normal.      The examined duodenum was normal.      There was no evidence of significant pathology in the entire examined       portion of jejunum.      A medium-sized hiatal hernia was present. Impression:            - Normal esophagus.                        - Normal stomach.                        - Normal examined duodenum.                        - The examined portion of the jejunum was normal.                        - Medium-sized hiatal hernia.                        - No specimens collected. Recommendation:        - Perform a colonoscopy today. Procedure Code(s):     --- Professional ---                        [phone], Small intestinal endoscopy, enteroscopy beyond                         second portion of duodenum, not including ileum;                         diagnostic, including collection of specimen(s) by                         brushing or washing, when performed (separate  procedure) Diagnosis Code(s):     --- Professional ---                        K44.9, Diaphragmatic hernia without obstruction or                         gangrene                        D50.9, Iron deficiency anemia, unspecified CPT copyright 2019 American Medical Association. All rights reserved. The codes documented in this report are preliminary and upon coder review may  be revised to meet current compliance requirements. [name], MD [name] MD, MD 01/27/2022 11:11:38 AM This report has been signed electronically. Number of Addenda: 0 Note Initiated On: 01/27/2022 10:50 AM Estimated Blood Loss:  Estimated blood loss: none.      [facility]

## 2022-01-27 NOTE — Plan of Care (Signed)
  Problem: Education: Goal: Knowledge of General Education information will improve Description Including pain rating scale, medication(s)/side effects and non-pharmacologic comfort measures Outcome: Progressing   

## 2022-01-28 DIAGNOSIS — D62 Acute posthemorrhagic anemia: Secondary | ICD-10-CM | POA: Diagnosis not present

## 2022-01-28 DIAGNOSIS — J449 Chronic obstructive pulmonary disease, unspecified: Secondary | ICD-10-CM | POA: Diagnosis not present

## 2022-01-28 DIAGNOSIS — K921 Melena: Secondary | ICD-10-CM | POA: Diagnosis not present

## 2022-01-28 LAB — BPAM RBC
Blood Product Expiration Date: 202309062359
Blood Product Expiration Date: 202309292359
Blood Product Expiration Date: 202309292359
Blood Product Expiration Date: 202309292359
ISSUE DATE / TIME: 202308240232
ISSUE DATE / TIME: 202308240540
ISSUE DATE / TIME: 202308250630
ISSUE DATE / TIME: 202308250935
Unit Type and Rh: 5100
Unit Type and Rh: 5100
Unit Type and Rh: 5100
Unit Type and Rh: 5100

## 2022-01-28 LAB — TYPE AND SCREEN
ABO/RH(D): O POS
Antibody Screen: NEGATIVE
Unit division: 0
Unit division: 0
Unit division: 0
Unit division: 0

## 2022-01-28 LAB — HEMOGLOBIN AND HEMATOCRIT, BLOOD
HCT: 27.3 % — ABNORMAL LOW (ref 36.0–46.0)
Hemoglobin: 8.7 g/dL — ABNORMAL LOW (ref 12.0–15.0)

## 2022-01-28 MED ORDER — SODIUM CHLORIDE 0.9 % IV SOLN
300.0000 mg | Freq: Once | INTRAVENOUS | Status: AC
  Administered 2022-01-28: 300 mg via INTRAVENOUS
  Filled 2022-01-28: qty 300

## 2022-01-28 NOTE — TOC Transition Note (Signed)
Transition of Care [facility]) - CM/SW Discharge Note   Patient Details  Name: Hailey Farrell MRN: [id number] Date of Birth: Sep 19, 1950  [facility] of Care [facility]) CM/SW Contact:  Izola Price, RN Phone Number: 01/28/2022, 10:38 AM   Clinical Narrative:   8/26: Discharge orders for today. No TOC needs identified or consulted for. Follow up appointment in AVS summary as follows. Patient has PCP.  Simmie Davies RN CM  To Do List  To Do List         Follow up with Letta Median, MD in 1 week(s) Specialty: Family Medicine [place] [address] [phone]         Follow up with Dr [name] in 1 week(s) Specialty: Gastroenterology [place]  [place] [address] [place] Office Visit with Sherri Sear, MD Wednesday Feb 01, 2022 1:15 PM (Arrive by 1:00 PM) Please arrive 15 minutes prior to your appointment. This will allow [place] to verify and update your medical record and ensure a full appointment for you within the time allotted. [facility] [address], [place]  [place] [address] [phone]      Final next level of care: Home/Self Care Barriers to Discharge: Barriers Resolved   Patient Goals and CMS Choice     Choice offered to / list presented to : NA  Discharge Placement                       Discharge Plan and Services                DME Arranged: N/A DME Agency: NA       HH Arranged: NA HH Agency: NA        Social Determinants of Health (SDOH) Interventions     Readmission Risk Interventions     No data to display

## 2022-01-28 NOTE — Discharge Summary (Addendum)
Physician Discharge Summary   Patient: Hailey Farrell MRN: [id number] DOB: Aug 27, 1950  Admit date:     01/25/2022  Discharge date: 01/28/22  Discharge Physician: Sharen Hones   PCP: Letta Median, MD   Recommendations at discharge:   Follow-up with PCP in 1 week.  Check a CBC in 3 to 5 days. Follow-up with GI in 1 week.  Discharge Diagnoses: Principal Problem:   GI bleeding Active Problems:   Acute post-hemorrhagic anemia   Essential hypertension   Hypokalemia   Dyslipidemia   Chronic obstructive pulmonary disease (COPD) (HCC)   Restless leg syndrome  Resolved Problems:   * No resolved hospital problems. *  Hospital Course: Hailey Farrell is a 71 y.o. African-American female with medical history significant for COPD, hypertension, dyslipidemia, migraine and restless leg syndrome as well as osteoarthritis, who presented to the emergency room with acute onset of melena for the last couple of weeks with associated generalized weakness and dizziness. Her hemoglobin was 5.1, she was given 2 units of PRBC, hemoglobin increased to 7.0.  Patient is also given IV iron and IV Protonix.  GI consult obtained.  8/25.  EGD and colonoscopy did not show any evidence of bleed.  Capsule endoscopy started.  Received 2 more units of blood transfusion for hemoglobin 6.6.  8/26.  No additional GI bleeding, hemoglobin stable.  Patient be received another dose of IV iron, medically stable to be discharged.  Assessment and Plan: GI bleeding Acute post-hemorrhagic anemia Patient so far has received 4 units of PRBC transfusion.  Also received IV iron x2.  EGD/colonoscopy did not show any evidence of bleeding.  Capsule endoscopy is completed, pending results. Patient no longer has any GI bleed over the last 24 hours, hemoglobin has went up.  Discussed with Dr. Vicente Males, patient can be discharged from his standpoint.  We will check a CBC in 3 to 5 days in PCP office, follow-up with GI in 1 week.    Hypokalemia Normalized.   Essential hypertension Continue to monitor.   Restless leg syndrome ropinirole.   Chronic obstructive pulmonary disease (COPD) (HCC) Stable.   Dyslipidemia continue statin therapy.   Internal iliac artery stenosis. Incidental finding on CT scan.  Patient has no evidence of lower extremity ischemia.  Patient will follow up with vascular surgery as outpatient.   Intra-abdominal lymphadenopathy. We will refer patient to oncology follow-up after discharge.   Adrenal gland stranding on CT scan. Check a.m. cortisol level to rule out adrenal insufficiency.   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 An incidental finding on CT scan, will be followed by oncology as outpatient.   Addendum: 9/17. Reviewed capsule endoscopy result, single small bowel AVM, no current bleeding  Add diagnosis:  Upper GI bleed secondary to small intestine AVM.       Consultants: GI Procedures performed: EGD with push endoscopy/colonoscopy Disposition: Home Diet recommendation:  Discharge Diet Orders (From admission, onward)     Start     Ordered   01/28/22 0000  Diet - low sodium heart healthy        01/28/22 1013           Cardiac diet DISCHARGE MEDICATION: Allergies as of 01/28/2022       Reactions   Latex Rash   Welts over body        Medication List     TAKE these medications    acetaminophen 650 MG CR tablet Commonly known as: TYLENOL Take 650 mg by mouth every  8 (eight) hours as needed for pain.   albuterol 108 (90 Base) MCG/ACT inhaler Commonly known as: VENTOLIN HFA Inhale 2 puffs into the lungs every 6 (six) hours as needed for wheezing.   amitriptyline 25 MG tablet Commonly known as: ELAVIL Take 25 mg by mouth at bedtime.   budesonide-formoterol 160-4.5 MCG/ACT inhaler Commonly known as: SYMBICORT Inhale 2 puffs into the lungs 2 (two) times daily.   cyanocobalamin 1000 MCG tablet Commonly known as: VITAMIN B12 Take 1,000 mcg by mouth  daily.   diphenhydramine-acetaminophen 25-500 MG Tabs tablet Commonly known as: TYLENOL PM Take 1-2 tablets by mouth at bedtime as needed (pain).   furosemide 20 MG tablet Commonly known as: LASIX Take 20 mg daily by mouth.   lisinopril 20 MG tablet Commonly known as: ZESTRIL Take 20 mg by mouth daily.   potassium chloride 10 MEQ CR capsule Commonly known as: MICRO-K Take 10 mEq by mouth daily.   rOPINIRole 2 MG tablet Commonly known as: REQUIP Take 2-4 mg by mouth at bedtime as needed (restless leg syndrome).   simvastatin 20 MG tablet Commonly known as: ZOCOR Take 20 mg by mouth daily.   tiotropium 18 MCG inhalation capsule Commonly known as: SPIRIVA Place 1 capsule (18 mcg total) into inhaler and inhale daily.        Follow-up Information     Bender, Durene Cal, MD Follow up in 1 week(s).   Specialty: Family Medicine Contact information: [place] [id number] [phone]         [name], MD Follow up in 1 week(s).   Specialty: Gastroenterology Contact information: [place] [address] [phone]                Discharge Exam: Danley Danker Weights   01/25/22 2114  Weight: 83.5 kg   General exam: Appears calm and comfortable  Respiratory system: Clear to auscultation. Respiratory effort normal. Cardiovascular system: S1 & S2 heard, RRR. No JVD, murmurs, rubs, gallops or clicks. No pedal edema. Gastrointestinal system: Abdomen is nondistended, soft and nontender. No organomegaly or masses felt. Normal bowel sounds heard. Central nervous system: Alert and oriented. No focal neurological deficits. Extremities: Symmetric 5 x 5 power. Skin: No rashes, lesions or ulcers Psychiatry: Judgement and insight appear normal. Mood & affect appropriate.    Condition at discharge: good  The results of significant diagnostics from this hospitalization (including imaging, microbiology, ancillary  and laboratory) are listed below for reference.   Imaging Studies: CT Angio Abd/Pel W and/or Wo Contrast  Result Date: 01/26/2022 CLINICAL DATA:  Lower GI bleed with dizziness, weakness and rectal bleeding. EXAM: CTA ABDOMEN AND PELVIS WITHOUT AND WITH CONTRAST TECHNIQUE: Multidetector CT imaging of the abdomen and pelvis was performed using the standard protocol during bolus administration of intravenous contrast. Multiplanar reconstructed images and MIPs were obtained and reviewed to evaluate the vascular anatomy. RADIATION DOSE REDUCTION: This exam was performed according to the departmental dose-optimization program which includes automated exposure control, adjustment of the mA and/or kV according to patient size and/or use of iterative reconstruction technique. CONTRAST:  162m OMNIPAQUE IOHEXOL 350 MG/ML SOLN COMPARISON:  None Available. FINDINGS: VASCULAR Aorta: There is moderately heavy calcific wall plaque without stenosis, aneurysm or dissection. Celiac: There are ostial calcific plaques and compression by the median arcuate ligament of the diaphragm causing no more than 30% origin stenosis. The vessel is otherwise widely patent. SMA: There are ostial calcifications, without stenosis, dissection or aneurysm.  Renals: Both renal arteries are single. There are bilateral nonstenosing ostial calcifications. Both vessels are widely patent. There are asymmetric renovascular calcifications at the right greater than left renal hila. IMA: Patent without evidence of aneurysm, dissection, vasculitis or significant stenosis. Inflow: Moderate to heavy calcific plaques involve both common femoral arteries, without flow-limiting stenosis. There is about 30% stenosis of both vessels. There are high-grade mixed plaque origin stenoses of both internal iliac arteries, with mild to moderate irregular calcific stenosis with the remainder. There are mild scattered calcifications in the external iliac arteries but neither  of these arteries has a flow-limiting stenosis, aneurysm or dissection. Proximal Outflow: There are moderate calcifications in both common femoral arteries and both proximal superficial femoral arteries, without flow-limiting stenosis. Both deep femoral arteries show scattered non stenosing plaques without focal stenosis. The circumflex femoral arteries appear clear. Veins: Patent. This includes the pelvic deep veins and proximal common femoral veins. Review of the MIP images confirms the above findings. NON-VASCULAR Lower chest: Mild cardiomegaly. Minimal pericardial effusion. The intraventricular blood pool is low in density on the noncontrast exam consistent with anemia. There is a nonspecific mildly prominent right infrahilar lymph node of 1.5 cm short axis. There is a 6 mm noncalcified left lower lobe medial basal nodule on series 13 axial 32, and an 8 mm noncalcified nodule in the posteromedial left lower lobe on 13:11. There are few linear scar-like opacities in both bases and slightly elevated right hemidiaphragm. Hepatobiliary: No focal liver abnormality is seen. No calcified gallstones, gallbladder wall thickening, or biliary dilatation. Pancreas: No focal abnormality. Spleen: No focal abnormality.  No splenomegaly. Adrenals/Urinary Tract: On all 3 study phases there is periadrenal stranding but no significant perinephric stranding. There are few tiny right renal cortical hypodensities which are too small to characterize.No follow-up imaging is recommended. JACR 2018 Feb; 264-273, Management of the Incidental RenalMass on CT, [employer] 2021; 814-848, Bosniak Classification of Cystic Renal Masses, Version 2019. There is no adrenal mass. Unremarkable left renal cortex. There is no urinary stone or obstruction. There is no bladder thickening. Stomach/Bowel: Small hiatal hernia. No dilatation or wall thickening including the appendix. Scattered uncomplicated colonic diverticula noted, mild fecal stasis  ascending colon. No contrast extravasation into the bowel is seen. Lymphatic: No appreciable adenopathy. Reproductive: Status post hysterectomy. No adnexal masses. Other: Small umbilical fat hernia. No free air, free hemorrhage or free fluid. No incarcerated hernia. Musculoskeletal: Advanced degenerative disc collapse L5-S1 is noted with grade 1 L5-S1 retrolisthesis. There is a heterogeneous slightly expansile intramedullary marrow lesion in the anterior column of the right acetabulum which measures 2.9 x 1.8 x 3.9 cm, with a well-defined sclerotic border and a complex matrix, but with overall nonaggressive appearance. There is grade 1 anterolisthesis at L4-5 due to advanced facet hypertrophy at this level, with acquired spinal canal stenosis. Foraminal stenosis is seen in L4-5 and especially severely L5-S1. IMPRESSION: VASCULAR 1. Aortic and branch vessel atherosclerosis but no flow-limiting visceral artery stenoses. 2. Inflow stenoses are seen but only in the internal iliac arteries and not in the external iliac arteries, with no significant proximal outflow stenosis. 3. Cardiomegaly with low density of the cardiac blood pool on unenhanced scanning consistent with anemia. NON-VASCULAR 1. Nonspecific mildly enlarged right hilar lymph node and 2 left lower lobe nodules measuring 6 mm and 8 mm. Per Fleischner Society Guidelines, recommend a non-contrast Chest CT at 3-6 months. If patient is high risk for malignancy, recommend an additional non-contrast Chest CT at 18-24 months; if  patient is low risk for malignancy a non-contrast Chest CT at 18-24 months is optional. These guidelines do not apply to immunocompromised patients and patients with cancer. Follow up in patients with significant comorbidities as clinically warranted. For lung cancer screening, adhere to Lung-RADS guidelines. Reference: Radiology. 2017; 284(1):228-43. 2. Bilateral periadrenal stranding on all 3 phases of the study. This may be seen with  adrenal congestion and may precede nontraumatic adrenal hemorrhage although adrenal no hemorrhage is currently seen. Close clinical follow-up with evaluation of adrenal function is recommended to determine of exogenous steroid treatment is needed, with repeat adrenal function tests as needed. 3. Scattered uncomplicated diverticulosis. 4. Heterogeneous slightly expansile lesion in the anterior column of the right acetabulum measuring 2.9 x 1.8 x 3.9 cm but with a nonaggressive appearance and a well-defined sclerotic border. Nonemergent follow-up MRI without and with contrast recommended. Electronically Signed   By: Telford Nab M.D.   On: 01/26/2022 03:10   MM 3D SCREEN BREAST BILATERAL  Result Date: 01/02/2022 CLINICAL DATA:  Screening. EXAM: DIGITAL SCREENING BILATERAL MAMMOGRAM WITH TOMOSYNTHESIS AND CAD TECHNIQUE: Bilateral screening digital craniocaudal and mediolateral oblique mammograms were obtained. Bilateral screening digital breast tomosynthesis was performed. The images were evaluated with computer-aided detection. COMPARISON:  Previous exam(s). ACR Breast Density Category b: There are scattered areas of fibroglandular density. FINDINGS: There are no findings suspicious for malignancy. IMPRESSION: No mammographic evidence of malignancy. A result letter of this screening mammogram will be mailed directly to the patient. RECOMMENDATION: Screening mammogram in one year. (Code:SM-B-01Y) BI-RADS CATEGORY  1: Negative. Electronically Signed   By: Fidela Salisbury M.D.   On: 01/02/2022 13:13    Microbiology: Results for orders placed or performed during the hospital encounter of 07/12/21  Resp Panel by RT-PCR (Flu A&B, Covid) Nasopharyngeal Swab     Status: None   Collection Time: 07/12/21 12:19 PM   Specimen: Nasopharyngeal Swab; Nasopharyngeal(NP) swabs in vial transport medium  Result Value Ref Range Status   SARS Coronavirus 2 by RT PCR NEGATIVE NEGATIVE Final    Comment: (NOTE) SARS-CoV-2  target nucleic acids are NOT DETECTED.  The SARS-CoV-2 RNA is generally detectable in upper respiratory specimens during the acute phase of infection. The lowest concentration of SARS-CoV-2 viral copies this assay can detect is 138 copies/mL. A negative result does not preclude SARS-Cov-2 infection and should not be used as the sole basis for treatment or other patient management decisions. A negative result may occur with  improper specimen collection/handling, submission of specimen other than nasopharyngeal swab, presence of viral mutation(s) within the areas targeted by this assay, and inadequate number of viral copies(<138 copies/mL). A negative result must be combined with clinical observations, patient history, and epidemiological information. The expected result is Negative.  Fact Sheet for Patients:  EntrepreneurPulse.com.au  Fact Sheet for Healthcare Providers:  IncredibleEmployment.be  This test is no t yet approved or cleared by the [place] FDA and  has been authorized for detection and/or diagnosis of SARS-CoV-2 by FDA under an Emergency Use Authorization (EUA). This EUA will remain  in effect (meaning this test can be used) for the duration of the COVID-19 declaration under Section 564(b)(1) of the Act, 21 U.S.C.section 360bbb-3(b)(1), unless the authorization is terminated  or revoked sooner.       Influenza A by PCR NEGATIVE NEGATIVE Final   Influenza B by PCR NEGATIVE NEGATIVE Final    Comment: (NOTE) The Xpert Xpress SARS-CoV-2/FLU/RSV plus assay is intended as an aid in the diagnosis of influenza  from Nasopharyngeal swab specimens and should not be used as a sole basis for treatment. Nasal washings and aspirates are unacceptable for Xpert Xpress SARS-CoV-2/FLU/RSV testing.  Fact Sheet for Patients: EntrepreneurPulse.com.au  Fact Sheet for Healthcare  Providers: IncredibleEmployment.be  This test is not yet approved or cleared by the [place] FDA and has been authorized for detection and/or diagnosis of SARS-CoV-2 by FDA under an Emergency Use Authorization (EUA). This EUA will remain in effect (meaning this test can be used) for the duration of the COVID-19 declaration under Section 564(b)(1) of the Act, 21 U.S.C. section 360bbb-3(b)(1), unless the authorization is terminated or revoked.  Performed at [facility], [address] [place], [place] [address]     Labs: CBC: Recent Labs  Lab 01/25/22 2123 01/26/22 0926 01/26/22 1543 01/27/22 0424 01/27/22 1336 01/27/22 1630 01/27/22 2249 01/28/22 0524  WBC 6.3 5.4  --   --   --   --   --   --   NEUTROABS 4.9  --   --   --   --   --   --   --   HGB 5.1* 7.0*   < > 6.6* 9.1* 9.2* 9.3* 8.7*  HCT 17.0* 22.7*   < > 21.7* 28.4* 29.1* 29.1* 27.3*  MCV 73.6* 79.6*  --   --   --   --   --   --   PLT 451* 351  --   --   --   --   --   --    < > = values in this interval not displayed.   Basic Metabolic Panel: Recent Labs  Lab 01/25/22 2123 01/26/22 0926 01/27/22 0424  NA 140 141 141  K 3.3* 3.9 3.7  CL 108 110 112*  CO2 '25 26 25  '$ GLUCOSE 110* 92 86  BUN '21 20 10  '$ CREATININE 0.97 0.84 0.72  CALCIUM 8.9 8.6* 8.5*  MG  --  2.3 2.1   Liver Function Tests: Recent Labs  Lab 01/25/22 2123  AST 15  ALT 13  ALKPHOS 30*  BILITOT 0.5  PROT 6.2*  ALBUMIN 3.8   CBG: No results for input(s): "GLUCAP" in the last 168 hours.  Discharge time spent: greater than 30 minutes.  Signed: Sharen Hones, MD [facility] 01/28/2022

## 2022-01-30 ENCOUNTER — Encounter: Payer: Self-pay | Admitting: Gastroenterology

## 2022-01-30 NOTE — Anesthesia Postprocedure Evaluation (Signed)
Anesthesia Post Note  Patient: Hailey Farrell  Procedure(s) Performed: ESOPHAGOGASTRODUODENOSCOPY (EGD) WITH PROPOFOL COLONOSCOPY WITH PROPOFOL  Patient location during evaluation: Endoscopy Anesthesia Type: General Level of consciousness: awake and alert Pain management: pain level controlled Vital Signs Assessment: post-procedure vital signs reviewed and stable Respiratory status: spontaneous breathing, nonlabored ventilation, respiratory function stable and patient connected to nasal cannula oxygen Cardiovascular status: blood pressure returned to baseline and stable Postop Assessment: no apparent nausea or vomiting Anesthetic complications: no   No notable events documented.   Last Vitals:  Vitals:   01/28/22 0331 01/28/22 0800  BP: 137/76 (!) 145/93  Pulse: 88 79  Resp: 17 17  Temp: 36.7 C   SpO2: 98% 97%    Last Pain:  Vitals:   01/28/22 0800  TempSrc:   PainSc: 0-No pain                 Dimas Millin

## 2022-01-30 NOTE — Anesthesia Postprocedure Evaluation (Deleted)
Anesthesia Post Note  Patient: Hailey Farrell  Procedure(s) Performed: ESOPHAGOGASTRODUODENOSCOPY (EGD) WITH PROPOFOL COLONOSCOPY WITH PROPOFOL  Patient location during evaluation: Endoscopy Anesthesia Type: General Level of consciousness: awake and alert Pain management: pain level controlled Vital Signs Assessment: post-procedure vital signs reviewed and stable Respiratory status: spontaneous breathing, nonlabored ventilation, respiratory function stable and patient connected to nasal cannula oxygen Cardiovascular status: blood pressure returned to baseline and stable Postop Assessment: no apparent nausea or vomiting Anesthetic complications: no   No notable events documented.   Last Vitals:  Vitals:   01/28/22 0331 01/28/22 0800  BP: 137/76 (!) 145/93  Pulse: 88 79  Resp: 17 17  Temp: 36.7 C   SpO2: 98% 97%    Last Pain:  Vitals:   01/28/22 0800  TempSrc:   PainSc: 0-No pain                 Dimas Millin

## 2022-02-01 ENCOUNTER — Ambulatory Visit (INDEPENDENT_AMBULATORY_CARE_PROVIDER_SITE_OTHER): Payer: Medicare Other | Admitting: Gastroenterology

## 2022-02-01 ENCOUNTER — Encounter: Payer: Self-pay | Admitting: Gastroenterology

## 2022-02-01 VITALS — BP 154/88 | HR 105 | Temp 98.2°F | Ht 64.0 in | Wt 182.4 lb

## 2022-02-01 DIAGNOSIS — D500 Iron deficiency anemia secondary to blood loss (chronic): Secondary | ICD-10-CM

## 2022-02-01 DIAGNOSIS — K5520 Angiodysplasia of colon without hemorrhage: Secondary | ICD-10-CM

## 2022-02-01 NOTE — Progress Notes (Signed)
Cephas Darby, MD [address]  [place], [place] [address]  Main: [phone]  Fax: [phone]    Gastroenterology Consultation  Referring Provider:     Letta Median, MD Primary Care Physician:  Letta Median, MD Primary Gastroenterologist:  Dr. Cephas Darby Reason for Consultation:   Iron deficiency anemia        HPI:   Hailey Farrell is a 71 y.o. female referred by Dr. Rebeca Alert, Durene Cal, MD  for consultation & management of chronic iron deficiency anemia.  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 pylori.  Patient has been receiving parenteral iron therapy.  [facility] secondary to acute on chronic iron deficiency anemia, hemoglobin dropped to 5.8 from 8.2, received blood transfusion as well as iron infusions.  She reports followed by hematology since discharge, has been receiving parenteral iron therapy.  She recently had another acute on chronic blood loss anemia on 5/8, preceding this she reported 2 to 3 days of black stools, her repeat hemoglobin dropped from 9.8 to 6.5, she received 1 unit of PRBCs at the cancer center along with IV iron.  She is currently scheduled to receive weekly iron infusions.  Patient currently reports having brown bowel movements.  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  Patient was recently admitted to [facility] on 01/25/2022 secondary to 2 weeks history of melena.  Her hemoglobin on admission was 5.1, received 4 units of PRBCs and underwent colonoscopy with small bowel enteroscopy with no source of bleeding identified.   Her hemoglobin at the time of discharge was 8.7 on 01/28/2022.  Patient tells me that she waited so long hoping that her bleeding would stop but she became very weak.  She is currently having brown bowel movements daily.  She is accompanied by her husband.  Video capsule endoscopy from 01/27/2022 revealed a nonbleeding small AVM in small bowel.  NSAIDs: None  Antiplts/Anticoagulants/Anti thrombotics: None  GI Procedures: Reviewed under procedures tab  Past Medical History:  Diagnosis Date   Anemia    Arthritis    Breast cancer ([place]) 1990   Right   COPD (chronic obstructive pulmonary disease) ([place])    Dyspnea    DOE   Elevated lipids    Hypertension    Lower extremity edema    Migraines    Migraines    Personal history of radiation therapy 1990   Breast   Restless leg syndrome    Rotator cuff injury     Past Surgical History:  Procedure Laterality Date   ABDOMINAL HYSTERECTOMY     BACK SURGERY  08/2017   CERVICAL FUSION   BREAST BIOPSY Right 1990   LUMPECTOMY.  took lymph nodes   BREAST LUMPECTOMY     COLONOSCOPY WITH PROPOFOL N/A 07/20/2017   Procedure: COLONOSCOPY WITH PROPOFOL;  Surgeon: Virgel Manifold, MD;  Location: [facility] ENDOSCOPY;  Service: Endoscopy;  Laterality: N/A;   COLONOSCOPY WITH PROPOFOL N/A 01/27/2022   Procedure: COLONOSCOPY WITH PROPOFOL;  Surgeon: [name], MD;  Location: [facility] ENDOSCOPY;  Service: Gastroenterology;  Laterality: N/A;  ENTEROSCOPY N/A 08/08/2017   Procedure: ENTEROSCOPY;  Surgeon: Lin Landsman, MD;  Location: [facility] ENDOSCOPY;  Service: Gastroenterology;  Laterality: N/A;   ENTEROSCOPY N/A 05/07/2020   Procedure: ENTEROSCOPY with Adult colonoscope;  Surgeon: Virgel Manifold, MD;  Location: [facility] ENDOSCOPY;  Service: Endoscopy;  Laterality: N/A;   ENTEROSCOPY N/A 03/17/2021   Procedure: ENTEROSCOPY;  Surgeon: Virgel Manifold, MD;  Location: [facility] ENDOSCOPY;  Service: Endoscopy;  Laterality: N/A;  PUSH   ENTEROSCOPY N/A  07/13/2021   Procedure: ENTEROSCOPY;  Surgeon: Lin Landsman, MD;  Location: [facility] ENDOSCOPY;  Service: Gastroenterology;  Laterality: N/A;   ENTEROSCOPY N/A 10/26/2021   Procedure: ENTEROSCOPY;  Surgeon: Lin Landsman, MD;  Location: [facility] ENDOSCOPY;  Service: Gastroenterology;  Laterality: N/A;  push   ESOPHAGOGASTRODUODENOSCOPY Left 04/13/2017   Procedure: ESOPHAGOGASTRODUODENOSCOPY (EGD);  Surgeon: Virgel Manifold, MD;  Location: [facility] ENDOSCOPY;  Service: Endoscopy;  Laterality: Left;   ESOPHAGOGASTRODUODENOSCOPY (EGD) WITH PROPOFOL N/A 07/20/2017   Procedure: ESOPHAGOGASTRODUODENOSCOPY (EGD) WITH PROPOFOL;  Surgeon: Virgel Manifold, MD;  Location: [facility] ENDOSCOPY;  Service: Endoscopy;  Laterality: N/A;   ESOPHAGOGASTRODUODENOSCOPY (EGD) WITH PROPOFOL N/A 12/12/2017   Procedure: ESOPHAGOGASTRODUODENOSCOPY (EGD) WITH PROPOFOL;  Surgeon: Virgel Manifold, MD;  Location: [facility] ENDOSCOPY;  Service: Endoscopy;  Laterality: N/A;   ESOPHAGOGASTRODUODENOSCOPY (EGD) WITH PROPOFOL N/A 12/23/2020   Procedure: ESOPHAGOGASTRODUODENOSCOPY (EGD) WITH PROPOFOL;  Surgeon: Virgel Manifold, MD;  Location: [facility] ENDOSCOPY;  Service: Endoscopy;  Laterality: N/A;   ESOPHAGOGASTRODUODENOSCOPY (EGD) WITH PROPOFOL N/A 01/27/2022   Procedure: ESOPHAGOGASTRODUODENOSCOPY (EGD) WITH PROPOFOL;  Surgeon: [name], MD;  Location: [facility] ENDOSCOPY;  Service: Gastroenterology;  Laterality: N/A;   GIVENS CAPSULE STUDY N/A 03/10/2021   Procedure: GIVENS CAPSULE STUDY;  Surgeon: Virgel Manifold, MD;  Location: [facility] ENDOSCOPY;  Service: Endoscopy;  Laterality: N/A;   MEDIAL PARTIAL KNEE REPLACEMENT Left 1990   torn ligament. no knee replacement at that time   PARTIAL HYSTERECTOMY     SHOULDER ARTHROSCOPY WITH OPEN ROTATOR CUFF REPAIR Left 03/01/2016   Procedure: SHOULDER ARTHROSCOPY WITH OPEN ROTATOR CUFF REPAIR;  Surgeon: Thornton Park, MD;  Location: [facility] ORS;  Service: Orthopedics;  Laterality:  Left;   TOTAL KNEE ARTHROPLASTY Left 02/14/2018   Procedure: TOTAL KNEE ARTHROPLASTY;  Surgeon: Thornton Park, MD;  Location: [facility] ORS;  Service: Orthopedics;  Laterality: Left;    Current Outpatient Medications:    acetaminophen (TYLENOL) 650 MG CR tablet, Take 650 mg by mouth every 8 (eight) hours as needed for pain., Disp: , Rfl:    albuterol (VENTOLIN HFA) 108 (90 Base) MCG/ACT inhaler, Inhale 2 puffs into the lungs every 6 (six) hours as needed for wheezing., Disp: , Rfl:    amitriptyline (ELAVIL) 25 MG tablet, Take 25 mg by mouth at bedtime., Disp: , Rfl:    budesonide-formoterol (SYMBICORT) 160-4.5 MCG/ACT inhaler, Inhale 2 puffs into the lungs 2 (two) times daily., Disp: , Rfl:    diphenhydramine-acetaminophen (TYLENOL PM) 25-500 MG TABS tablet, Take 1-2 tablets by mouth at bedtime as needed (pain)., Disp: , Rfl:    furosemide (LASIX) 20 MG tablet, Take 20 mg daily by mouth. , Disp: , Rfl:    lisinopril (PRINIVIL,ZESTRIL) 20 MG tablet, Take 20 mg by mouth daily., Disp: , Rfl:    potassium chloride (MICRO-K) 10 MEQ CR capsule, Take 10 mEq by mouth daily., Disp: , Rfl:    rOPINIRole (REQUIP) 2 MG tablet, Take 2-4 mg by mouth at bedtime as needed (restless leg syndrome)., Disp: , Rfl:    simvastatin (  ZOCOR) 20 MG tablet, Take 20 mg by mouth daily. , Disp: , Rfl:    tiotropium (SPIRIVA) 18 MCG inhalation capsule, Place 1 capsule (18 mcg total) into inhaler and inhale daily., Disp: 30 capsule, Rfl: 0   vitamin B-12 (CYANOCOBALAMIN) 1000 MCG tablet, Take 1,000 mcg by mouth daily., Disp: , Rfl:  No current facility-administered medications for this visit.  Facility-Administered Medications Ordered in Other Visits:    0.9 %  sodium chloride infusion, , Intravenous, Continuous, Sindy Guadeloupe, MD, Last Rate: 20 mL/hr at 10/26/21 1058, Continued from Pre-op at 10/26/21 1058    Family History  Problem Relation Age of Onset   CAD Mother    CAD Sister    Breast cancer Neg Hx      Social  History   Tobacco Use   Smoking status: Some Days    Packs/day: 0.25    Years: 50.00    Total pack years: 12.50    Types: Cigarettes    Last attempt to quit: 03/13/2016    Years since quitting: 5.8   Smokeless tobacco: Never  Vaping Use   Vaping Use: Never used  Substance Use Topics   Alcohol use: No   Drug use: No    Allergies as of 02/01/2022 - Review Complete 02/01/2022  Allergen Reaction Noted   Latex Rash 02/03/2015    Review of Systems:    All systems reviewed and negative except where noted in HPI.   Physical Exam:  BP (!) 154/88 (BP Location: Left Arm, Patient Position: Sitting, Cuff Size: Normal)   Pulse (!) 105   Temp 98.2 F (36.8 C) (Oral)   Ht '5\' 4"'$  (1.626 m)   Wt 182 lb 6 oz (82.7 kg)   BMI 31.30 kg/m  No LMP recorded. Patient has had a hysterectomy.  General:   Alert,  Well-developed, well-nourished, pleasant and cooperative in NAD Head:  Normocephalic and atraumatic. Eyes:  Sclera clear, no icterus.   Conjunctiva pink. Ears:  Normal auditory acuity. Nose:  No deformity, discharge, or lesions. Mouth:  No deformity or lesions,oropharynx pink & moist. Neck:  Supple; no masses or thyromegaly. Lungs:  Respirations even and unlabored.  Clear throughout to auscultation.   No wheezes, crackles, or rhonchi. No acute distress. Heart:  Regular rate and rhythm; no murmurs, clicks, rubs, or gallops. Abdomen:  Normal bowel sounds. Soft, non-tender and non-distended without masses, hepatosplenomegaly or hernias noted.  No guarding or rebound tenderness.   Rectal: Not performed Msk:  Symmetrical without gross deformities. Good, equal movement & strength bilaterally. Pulses:  Normal pulses noted. Extremities:  No clubbing or edema.  No cyanosis. Neurologic:  Alert and oriented x3;  grossly normal neurologically. Skin:  Intact without significant lesions or rashes. No jaundice. Psych:  Alert and cooperative. Normal mood and affect.  Imaging Studies: No abdominal  imaging  Assessment and Plan:   Hailey Farrell is a 71 y.o. 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  Patient underwent repeat colonoscopy as well as small bowel enteroscopy, small bowel AVMs were not identified.  She also underwent video capsule endoscopy on 01/27/2022 which revealed a small nonbleeding AVM in the small intestine.  Patient's bleeding has currently stopped.    Messaged Dr. Grayland Ormond regarding trial of octreotide to reduce the risk of recurrent bleeding from AVMs Advised patient to  completely stop smoking tobacco as this might increase the risk for recurrent bleed from AVMs   Follow up as needed   Cephas Darby, MD

## 2022-02-02 ENCOUNTER — Telehealth: Payer: Self-pay

## 2022-02-02 ENCOUNTER — Other Ambulatory Visit: Payer: Self-pay | Admitting: Oncology

## 2022-02-02 LAB — PREPARE RBC (CROSSMATCH)

## 2022-02-02 NOTE — Telephone Encounter (Signed)
-----   Message from Lin Landsman, MD sent at 02/01/2022  6:07 PM EDT ----- Regarding: Results Caryl Pina  Please call patient and inform her that the video capsule endoscopy showed a small nonbleeding AVM in her small intestine.  Since patient is not currently bleeding, I do not recommend repeat endoscopic procedure at this time since she had these procedures last week.  Please inform patient that I do not see any confirmation of her receiving octreotide shot at Dr. Gary Fleet office.  Please remind her to discuss about this with Dr. Grayland Ormond when she sees him on 9/7.  I also personally sent a staff message to Dr. Grayland Ormond  Thanks Sherri Sear

## 2022-02-02 NOTE — Telephone Encounter (Signed)
Called and left a message for call back  

## 2022-02-07 NOTE — Telephone Encounter (Signed)
Called and left a message for call back. Sent letter to patient

## 2022-02-08 ENCOUNTER — Inpatient Hospital Stay: Payer: Medicare Other | Attending: Oncology

## 2022-02-08 DIAGNOSIS — Z79899 Other long term (current) drug therapy: Secondary | ICD-10-CM | POA: Insufficient documentation

## 2022-02-08 DIAGNOSIS — D509 Iron deficiency anemia, unspecified: Secondary | ICD-10-CM | POA: Insufficient documentation

## 2022-02-08 LAB — FERRITIN: Ferritin: 86 ng/mL (ref 11–307)

## 2022-02-08 LAB — IRON AND TIBC
Iron: 82 ug/dL (ref 28–170)
Saturation Ratios: 23 % (ref 10.4–31.8)
TIBC: 360 ug/dL (ref 250–450)
UIBC: 278 ug/dL

## 2022-02-08 LAB — CBC WITH DIFFERENTIAL/PLATELET
Abs Immature Granulocytes: 0.03 10*3/uL (ref 0.00–0.07)
Basophils Absolute: 0 10*3/uL (ref 0.0–0.1)
Basophils Relative: 1 %
Eosinophils Absolute: 0 10*3/uL (ref 0.0–0.5)
Eosinophils Relative: 1 %
HCT: 34.3 % — ABNORMAL LOW (ref 36.0–46.0)
Hemoglobin: 10.6 g/dL — ABNORMAL LOW (ref 12.0–15.0)
Immature Granulocytes: 1 %
Lymphocytes Relative: 20 %
Lymphs Abs: 1.2 10*3/uL (ref 0.7–4.0)
MCH: 27 pg (ref 26.0–34.0)
MCHC: 30.9 g/dL (ref 30.0–36.0)
MCV: 87.3 fL (ref 80.0–100.0)
Monocytes Absolute: 0.5 10*3/uL (ref 0.1–1.0)
Monocytes Relative: 8 %
Neutro Abs: 4.1 10*3/uL (ref 1.7–7.7)
Neutrophils Relative %: 69 %
Platelets: 377 10*3/uL (ref 150–400)
RBC: 3.93 MIL/uL (ref 3.87–5.11)
RDW: 21.3 % — ABNORMAL HIGH (ref 11.5–15.5)
WBC: 5.9 10*3/uL (ref 4.0–10.5)
nRBC: 0 % (ref 0.0–0.2)

## 2022-02-08 LAB — SAMPLE TO BLOOD BANK

## 2022-02-08 MED FILL — Iron Sucrose Inj 20 MG/ML (Fe Equiv): INTRAVENOUS | Qty: 10 | Status: AC

## 2022-02-09 ENCOUNTER — Inpatient Hospital Stay (HOSPITAL_BASED_OUTPATIENT_CLINIC_OR_DEPARTMENT_OTHER): Payer: Medicare Other | Admitting: Oncology

## 2022-02-09 ENCOUNTER — Inpatient Hospital Stay: Payer: Medicare Other

## 2022-02-09 ENCOUNTER — Encounter: Payer: Self-pay | Admitting: Oncology

## 2022-02-09 VITALS — BP 113/70 | HR 111 | Temp 98.9°F | Resp 16 | Ht 64.0 in | Wt 179.0 lb

## 2022-02-09 DIAGNOSIS — D509 Iron deficiency anemia, unspecified: Secondary | ICD-10-CM

## 2022-02-09 MED ORDER — OCTREOTIDE ACETATE 30 MG IM KIT
30.0000 mg | PACK | Freq: Once | INTRAMUSCULAR | Status: AC
  Administered 2022-02-09: 30 mg via INTRAMUSCULAR
  Filled 2022-02-09: qty 1

## 2022-02-09 NOTE — Progress Notes (Signed)
[place]  [phone] [phone]  ID: Hailey Farrell OB: April 09, 1951  MR#: [id number]  CSN#:[id number]  Patient Care Team: Letta Median, MD as PCP - General (Family Medicine)  CHIEF COMPLAINT: Iron deficiency anemia  INTERVAL HISTORY: Patient returns to clinic today for repeat laboratory, further evaluation, and consideration of IV Venofer.  She was recently admitted to the hospital where she received IV iron at that time.  She currently feels well.  She does not complain of any weakness or fatigue.  She has no neurologic complaints.  She denies any recent fevers or illnesses.  She has a good appetite and denies weight loss.    She denies any chest pain, shortness of breath, cough, or hemoptysis.  She denies any nausea, vomiting, constipation, or diarrhea. She has no further melena or hematochezia.  She has no urinary complaints.  Patient offers no specific complaints today.  REVIEW OF SYSTEMS:   Review of Systems  Constitutional: Negative.  Negative for fever, malaise/fatigue and weight loss.  Respiratory: Negative.  Negative for cough and shortness of breath.   Cardiovascular: Negative.  Negative for chest pain and leg swelling.  Gastrointestinal: Negative.  Negative for abdominal pain, blood in stool and melena.  Genitourinary: Negative.  Negative for hematuria.  Musculoskeletal: Negative.  Negative for back pain.  Skin: Negative.  Negative for rash.  Neurological: Negative.  Negative for dizziness, focal weakness, weakness and headaches.  Psychiatric/Behavioral: Negative.  The patient is not nervous/anxious.     As per HPI. Otherwise, a complete review of systems is negative.  PAST MEDICAL HISTORY: Past Medical History:  Diagnosis Date   Anemia    Arthritis    Breast cancer ([place]) 1990   Right   COPD (chronic obstructive pulmonary disease) ([place])    Dyspnea    DOE   Elevated lipids    Hypertension    Lower extremity edema     Migraines    Migraines    Personal history of radiation therapy 1990   Breast   Restless leg syndrome    Rotator cuff injury     PAST SURGICAL HISTORY: Past Surgical History:  Procedure Laterality Date   ABDOMINAL HYSTERECTOMY     BACK SURGERY  08/2017   CERVICAL FUSION   BREAST BIOPSY Right 1990   LUMPECTOMY.  took lymph nodes   BREAST LUMPECTOMY     COLONOSCOPY WITH PROPOFOL N/A 07/20/2017   Procedure: COLONOSCOPY WITH PROPOFOL;  Surgeon: Virgel Manifold, MD;  Location: [facility] ENDOSCOPY;  Service: Endoscopy;  Laterality: N/A;   COLONOSCOPY WITH PROPOFOL N/A 01/27/2022   Procedure: COLONOSCOPY WITH PROPOFOL;  Surgeon: [name], MD;  Location: [facility] ENDOSCOPY;  Service: Gastroenterology;  Laterality: N/A;   ENTEROSCOPY N/A 08/08/2017   Procedure: ENTEROSCOPY;  Surgeon: Lin Landsman, MD;  Location: [facility] ENDOSCOPY;  Service: Gastroenterology;  Laterality: N/A;   ENTEROSCOPY N/A 05/07/2020   Procedure: ENTEROSCOPY with Adult colonoscope;  Surgeon: Virgel Manifold, MD;  Location: [facility] ENDOSCOPY;  Service: Endoscopy;  Laterality: N/A;   ENTEROSCOPY N/A 03/17/2021   Procedure: ENTEROSCOPY;  Surgeon: Virgel Manifold, MD;  Location: [facility] ENDOSCOPY;  Service: Endoscopy;  Laterality: N/A;  PUSH   ENTEROSCOPY N/A 07/13/2021   Procedure: ENTEROSCOPY;  Surgeon: Lin Landsman, MD;  Location: [facility] ENDOSCOPY;  Service: Gastroenterology;  Laterality: N/A;   ENTEROSCOPY N/A 10/26/2021   Procedure: ENTEROSCOPY;  Surgeon: Lin Landsman, MD;  Location: [facility] ENDOSCOPY;  Service: Gastroenterology;  Laterality: N/A;  push  ESOPHAGOGASTRODUODENOSCOPY Left 04/13/2017   Procedure: ESOPHAGOGASTRODUODENOSCOPY (EGD);  Surgeon: Virgel Manifold, MD;  Location: [facility] ENDOSCOPY;  Service: Endoscopy;  Laterality: Left;   ESOPHAGOGASTRODUODENOSCOPY (EGD) WITH PROPOFOL N/A 07/20/2017   Procedure: ESOPHAGOGASTRODUODENOSCOPY (EGD) WITH PROPOFOL;  Surgeon: Virgel Manifold, MD;   Location: [facility] ENDOSCOPY;  Service: Endoscopy;  Laterality: N/A;   ESOPHAGOGASTRODUODENOSCOPY (EGD) WITH PROPOFOL N/A 12/12/2017   Procedure: ESOPHAGOGASTRODUODENOSCOPY (EGD) WITH PROPOFOL;  Surgeon: Virgel Manifold, MD;  Location: [facility] ENDOSCOPY;  Service: Endoscopy;  Laterality: N/A;   ESOPHAGOGASTRODUODENOSCOPY (EGD) WITH PROPOFOL N/A 12/23/2020   Procedure: ESOPHAGOGASTRODUODENOSCOPY (EGD) WITH PROPOFOL;  Surgeon: Virgel Manifold, MD;  Location: [facility] ENDOSCOPY;  Service: Endoscopy;  Laterality: N/A;   ESOPHAGOGASTRODUODENOSCOPY (EGD) WITH PROPOFOL N/A 01/27/2022   Procedure: ESOPHAGOGASTRODUODENOSCOPY (EGD) WITH PROPOFOL;  Surgeon: [name], MD;  Location: [facility] ENDOSCOPY;  Service: Gastroenterology;  Laterality: N/A;   GIVENS CAPSULE STUDY N/A 03/10/2021   Procedure: GIVENS CAPSULE STUDY;  Surgeon: Virgel Manifold, MD;  Location: [facility] ENDOSCOPY;  Service: Endoscopy;  Laterality: N/A;   MEDIAL PARTIAL KNEE REPLACEMENT Left 1990   torn ligament. no knee replacement at that time   PARTIAL HYSTERECTOMY     SHOULDER ARTHROSCOPY WITH OPEN ROTATOR CUFF REPAIR Left 03/01/2016   Procedure: SHOULDER ARTHROSCOPY WITH OPEN ROTATOR CUFF REPAIR;  Surgeon: Thornton Park, MD;  Location: [facility] ORS;  Service: Orthopedics;  Laterality: Left;   TOTAL KNEE ARTHROPLASTY Left 02/14/2018   Procedure: TOTAL KNEE ARTHROPLASTY;  Surgeon: Thornton Park, MD;  Location: [facility] ORS;  Service: Orthopedics;  Laterality: Left;    FAMILY HISTORY: Family History  Problem Relation Age of Onset   CAD Mother    CAD Sister    Breast cancer Neg Hx     ADVANCED DIRECTIVES (Y/N):  N  HEALTH MAINTENANCE: Social History   Tobacco Use   Smoking status: Some Days    Packs/day: 0.25    Years: 50.00    Total pack years: 12.50    Types: Cigarettes    Last attempt to quit: 03/13/2016    Years since quitting: 5.9   Smokeless tobacco: Never  Vaping Use   Vaping Use: Never used  Substance Use Topics    Alcohol use: No   Drug use: No     Colonoscopy:  PAP:  Bone density:  Lipid panel:  Allergies  Allergen Reactions   Latex Rash    Welts over body    Current Outpatient Medications  Medication Sig Dispense Refill   acetaminophen (TYLENOL) 650 MG CR tablet Take 650 mg by mouth every 8 (eight) hours as needed for pain.     albuterol (VENTOLIN HFA) 108 (90 Base) MCG/ACT inhaler Inhale 2 puffs into the lungs every 6 (six) hours as needed for wheezing.     amitriptyline (ELAVIL) 25 MG tablet Take 25 mg by mouth at bedtime.     budesonide-formoterol (SYMBICORT) 160-4.5 MCG/ACT inhaler Inhale 2 puffs into the lungs 2 (two) times daily.     diphenhydramine-acetaminophen (TYLENOL PM) 25-500 MG TABS tablet Take 1-2 tablets by mouth at bedtime as needed (pain).     Fe Fum-Fe Poly-Vit C-Lactobac (FUSION PO) Take 1 tablet by mouth daily.     furosemide (LASIX) 20 MG tablet Take 20 mg daily by mouth.      lisinopril (PRINIVIL,ZESTRIL) 20 MG tablet Take 20 mg by mouth daily.     potassium chloride (MICRO-K) 10 MEQ CR capsule Take 10 mEq by mouth daily.     rOPINIRole (REQUIP) 2 MG tablet Take  2-4 mg by mouth at bedtime as needed (restless leg syndrome).     simvastatin (ZOCOR) 20 MG tablet Take 20 mg by mouth daily.      tiotropium (SPIRIVA) 18 MCG inhalation capsule Place 1 capsule (18 mcg total) into inhaler and inhale daily. 30 capsule 0   vitamin B-12 (CYANOCOBALAMIN) 1000 MCG tablet Take 1,000 mcg by mouth daily.     No current facility-administered medications for this visit.   Facility-Administered Medications Ordered in Other Visits  Medication Dose Route Frequency Provider Last Rate Last Admin   0.9 %  sodium chloride infusion   Intravenous Continuous Sindy Guadeloupe, MD 20 mL/hr at 10/26/21 1058 Continued from Pre-op at 10/26/21 1058    OBJECTIVE: Vitals:   02/09/22 1416  BP: 113/70  Pulse: (!) 111  Resp: 16  Temp: 98.9 F (37.2 C)     Body mass index is 30.73 kg/m.    ECOG  FS:0 - Asymptomatic  General: Well-developed, well-nourished, no acute distress. Eyes: Pink conjunctiva, anicteric sclera. HEENT: Normocephalic, moist mucous membranes. Lungs: No audible wheezing or coughing. Heart: Regular rate and rhythm. Abdomen: Soft, nontender, no obvious distention. Musculoskeletal: No edema, cyanosis, or clubbing. Neuro: Alert, answering all questions appropriately. Cranial nerves grossly intact. Skin: No rashes or petechiae noted. Psych: Normal affect.  LAB RESULTS:  Lab Results  Component Value Date   NA 141 01/27/2022   K 3.7 01/27/2022   CL 112 (H) 01/27/2022   CO2 25 01/27/2022   GLUCOSE 86 01/27/2022   BUN 10 01/27/2022   CREATININE 0.72 01/27/2022   CALCIUM 8.5 (L) 01/27/2022   PROT 6.2 (L) 01/25/2022   ALBUMIN 3.8 01/25/2022   AST 15 01/25/2022   ALT 13 01/25/2022   ALKPHOS 30 (L) 01/25/2022   BILITOT 0.5 01/25/2022   GFRNONAA >60 01/27/2022   GFRAA >60 02/16/2018    Lab Results  Component Value Date   WBC 5.9 02/08/2022   NEUTROABS 4.1 02/08/2022   HGB 10.6 (L) 02/08/2022   HCT 34.3 (L) 02/08/2022   MCV 87.3 02/08/2022   PLT 377 02/08/2022   Lab Results  Component Value Date   IRON 82 02/08/2022   TIBC 360 02/08/2022   IRONPCTSAT 23 02/08/2022   Lab Results  Component Value Date   FERRITIN 86 02/08/2022     STUDIES: CT Angio Abd/Pel W and/or Wo Contrast  Result Date: 01/26/2022 CLINICAL DATA:  Lower GI bleed with dizziness, weakness and rectal bleeding. EXAM: CTA ABDOMEN AND PELVIS WITHOUT AND WITH CONTRAST TECHNIQUE: Multidetector CT imaging of the abdomen and pelvis was performed using the standard protocol during bolus administration of intravenous contrast. Multiplanar reconstructed images and MIPs were obtained and reviewed to evaluate the vascular anatomy. RADIATION DOSE REDUCTION: This exam was performed according to the departmental dose-optimization program which includes automated exposure control, adjustment of the  mA and/or kV according to patient size and/or use of iterative reconstruction technique. CONTRAST:  150m OMNIPAQUE IOHEXOL 350 MG/ML SOLN COMPARISON:  None Available. FINDINGS: VASCULAR Aorta: There is moderately heavy calcific wall plaque without stenosis, aneurysm or dissection. Celiac: There are ostial calcific plaques and compression by the median arcuate ligament of the diaphragm causing no more than 30% origin stenosis. The vessel is otherwise widely patent. SMA: There are ostial calcifications, without stenosis, dissection or aneurysm. Renals: Both renal arteries are single. There are bilateral nonstenosing ostial calcifications. Both vessels are widely patent. There are asymmetric renovascular calcifications at the right greater than left renal hila. IMA: Patent without  evidence of aneurysm, dissection, vasculitis or significant stenosis. Inflow: Moderate to heavy calcific plaques involve both common femoral arteries, without flow-limiting stenosis. There is about 30% stenosis of both vessels. There are high-grade mixed plaque origin stenoses of both internal iliac arteries, with mild to moderate irregular calcific stenosis with the remainder. There are mild scattered calcifications in the external iliac arteries but neither of these arteries has a flow-limiting stenosis, aneurysm or dissection. Proximal Outflow: There are moderate calcifications in both common femoral arteries and both proximal superficial femoral arteries, without flow-limiting stenosis. Both deep femoral arteries show scattered non stenosing plaques without focal stenosis. The circumflex femoral arteries appear clear. Veins: Patent. This includes the pelvic deep veins and proximal common femoral veins. Review of the MIP images confirms the above findings. NON-VASCULAR Lower chest: Mild cardiomegaly. Minimal pericardial effusion. The intraventricular blood pool is low in density on the noncontrast exam consistent with anemia. There is a  nonspecific mildly prominent right infrahilar lymph node of 1.5 cm short axis. There is a 6 mm noncalcified left lower lobe medial basal nodule on series 13 axial 32, and an 8 mm noncalcified nodule in the posteromedial left lower lobe on 13:11. There are few linear scar-like opacities in both bases and slightly elevated right hemidiaphragm. Hepatobiliary: No focal liver abnormality is seen. No calcified gallstones, gallbladder wall thickening, or biliary dilatation. Pancreas: No focal abnormality. Spleen: No focal abnormality.  No splenomegaly. Adrenals/Urinary Tract: On all 3 study phases there is periadrenal stranding but no significant perinephric stranding. There are few tiny right renal cortical hypodensities which are too small to characterize.No follow-up imaging is recommended. JACR 2018 Feb; 264-273, Management of the Incidental RenalMass on CT, [employer] 2021; 814-848, Bosniak Classification of Cystic Renal Masses, Version 2019. There is no adrenal mass. Unremarkable left renal cortex. There is no urinary stone or obstruction. There is no bladder thickening. Stomach/Bowel: Small hiatal hernia. No dilatation or wall thickening including the appendix. Scattered uncomplicated colonic diverticula noted, mild fecal stasis ascending colon. No contrast extravasation into the bowel is seen. Lymphatic: No appreciable adenopathy. Reproductive: Status post hysterectomy. No adnexal masses. Other: Small umbilical fat hernia. No free air, free hemorrhage or free fluid. No incarcerated hernia. Musculoskeletal: Advanced degenerative disc collapse L5-S1 is noted with grade 1 L5-S1 retrolisthesis. There is a heterogeneous slightly expansile intramedullary marrow lesion in the anterior column of the right acetabulum which measures 2.9 x 1.8 x 3.9 cm, with a well-defined sclerotic border and a complex matrix, but with overall nonaggressive appearance. There is grade 1 anterolisthesis at L4-5 due to advanced facet  hypertrophy at this level, with acquired spinal canal stenosis. Foraminal stenosis is seen in L4-5 and especially severely L5-S1. IMPRESSION: VASCULAR 1. Aortic and branch vessel atherosclerosis but no flow-limiting visceral artery stenoses. 2. Inflow stenoses are seen but only in the internal iliac arteries and not in the external iliac arteries, with no significant proximal outflow stenosis. 3. Cardiomegaly with low density of the cardiac blood pool on unenhanced scanning consistent with anemia. NON-VASCULAR 1. Nonspecific mildly enlarged right hilar lymph node and 2 left lower lobe nodules measuring 6 mm and 8 mm. Per Fleischner Society Guidelines, recommend a non-contrast Chest CT at 3-6 months. If patient is high risk for malignancy, recommend an additional non-contrast Chest CT at 18-24 months; if patient is low risk for malignancy a non-contrast Chest CT at 18-24 months is optional. These guidelines do not apply to immunocompromised patients and patients with cancer. Follow up in patients with significant  comorbidities as clinically warranted. For lung cancer screening, adhere to Lung-RADS guidelines. Reference: Radiology. 2017; 284(1):228-43. 2. Bilateral periadrenal stranding on all 3 phases of the study. This may be seen with adrenal congestion and may precede nontraumatic adrenal hemorrhage although adrenal no hemorrhage is currently seen. Close clinical follow-up with evaluation of adrenal function is recommended to determine of exogenous steroid treatment is needed, with repeat adrenal function tests as needed. 3. Scattered uncomplicated diverticulosis. 4. Heterogeneous slightly expansile lesion in the anterior column of the right acetabulum measuring 2.9 x 1.8 x 3.9 cm but with a nonaggressive appearance and a well-defined sclerotic border. Nonemergent follow-up MRI without and with contrast recommended. Electronically Signed   By: Telford Nab M.D.   On: 01/26/2022 03:10     ASSESSMENT: Iron  deficiency anemia.  PLAN:    1. 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  Argon plasma was used for hemostasis.  Patient's hemoglobin is decreased, but improving to 10.6.  Her iron stores are now within normal limits.  She last received IV Venofer on January 28, 2022.  No treatment is needed at this time.  Return to clinic in 4 months with repeat laboratory, further evaluation, and consideration of treatment if needed.   2.  History of AVMs: Appreciate GI input.  They have recommended 30 mg IM octreotide monthly.  Proceed with treatment today.  Return to clinic in 1, 2, and 3 months for consideration of treatment and then in 4 months as above.   I spent a total of 30 minutes reviewing chart data, face-to-face evaluation with the patient, counseling and coordination of care as detailed above.  Patient expressed understanding and was in agreement with this plan. She also understands that She can call clinic at any time with any questions, concerns, or complaints.    Lloyd Huger, MD   02/10/2022 3:30 PM

## 2022-02-09 NOTE — Progress Notes (Signed)
Pt feels better but stil some fatigue

## 2022-02-10 ENCOUNTER — Encounter: Payer: Self-pay | Admitting: Oncology

## 2022-02-13 ENCOUNTER — Telehealth: Payer: Self-pay | Admitting: Gastroenterology

## 2022-02-13 ENCOUNTER — Encounter: Payer: Self-pay | Admitting: Gastroenterology

## 2022-02-13 NOTE — Telephone Encounter (Signed)
Patient calling for results of colonoscopy and upper endoscopy. Requests call back from Dr Marius Ditch or her nurse.

## 2022-02-13 NOTE — Telephone Encounter (Signed)
Dr. Marius Ditch can you please review capsule study and let me know what to tell her. Thank you.

## 2022-02-14 NOTE — Telephone Encounter (Signed)
  Please call patient and inform her that the video capsule endoscopy showed a small nonbleeding AVM in her small intestine.  Since patient is not currently bleeding, I do not recommend repeat endoscopic procedure at this time since she had these procedures last week.  Please inform patient that I do not see any confirmation of her receiving octreotide shot at Dr. Gary Fleet office.  Please remind her to discuss about this with Dr. Grayland Ormond when she sees him on 9/7.  I also personally sent a staff message to Dr. Doree Fudge and left  a  message for call back

## 2022-02-22 IMAGING — CR DG ABDOMEN 2V
1 series · 3 of 3 positions shown · non-contrast
Comparison: 05/07/2020

CLINICAL DATA: Capsule endoscopy, assessment for position

EXAM:
ABDOMEN - 2 VIEW

[Series 1: dg abd 2 views · 0.14mm/px · 3 of 3 slices shown]
[im 1/3]
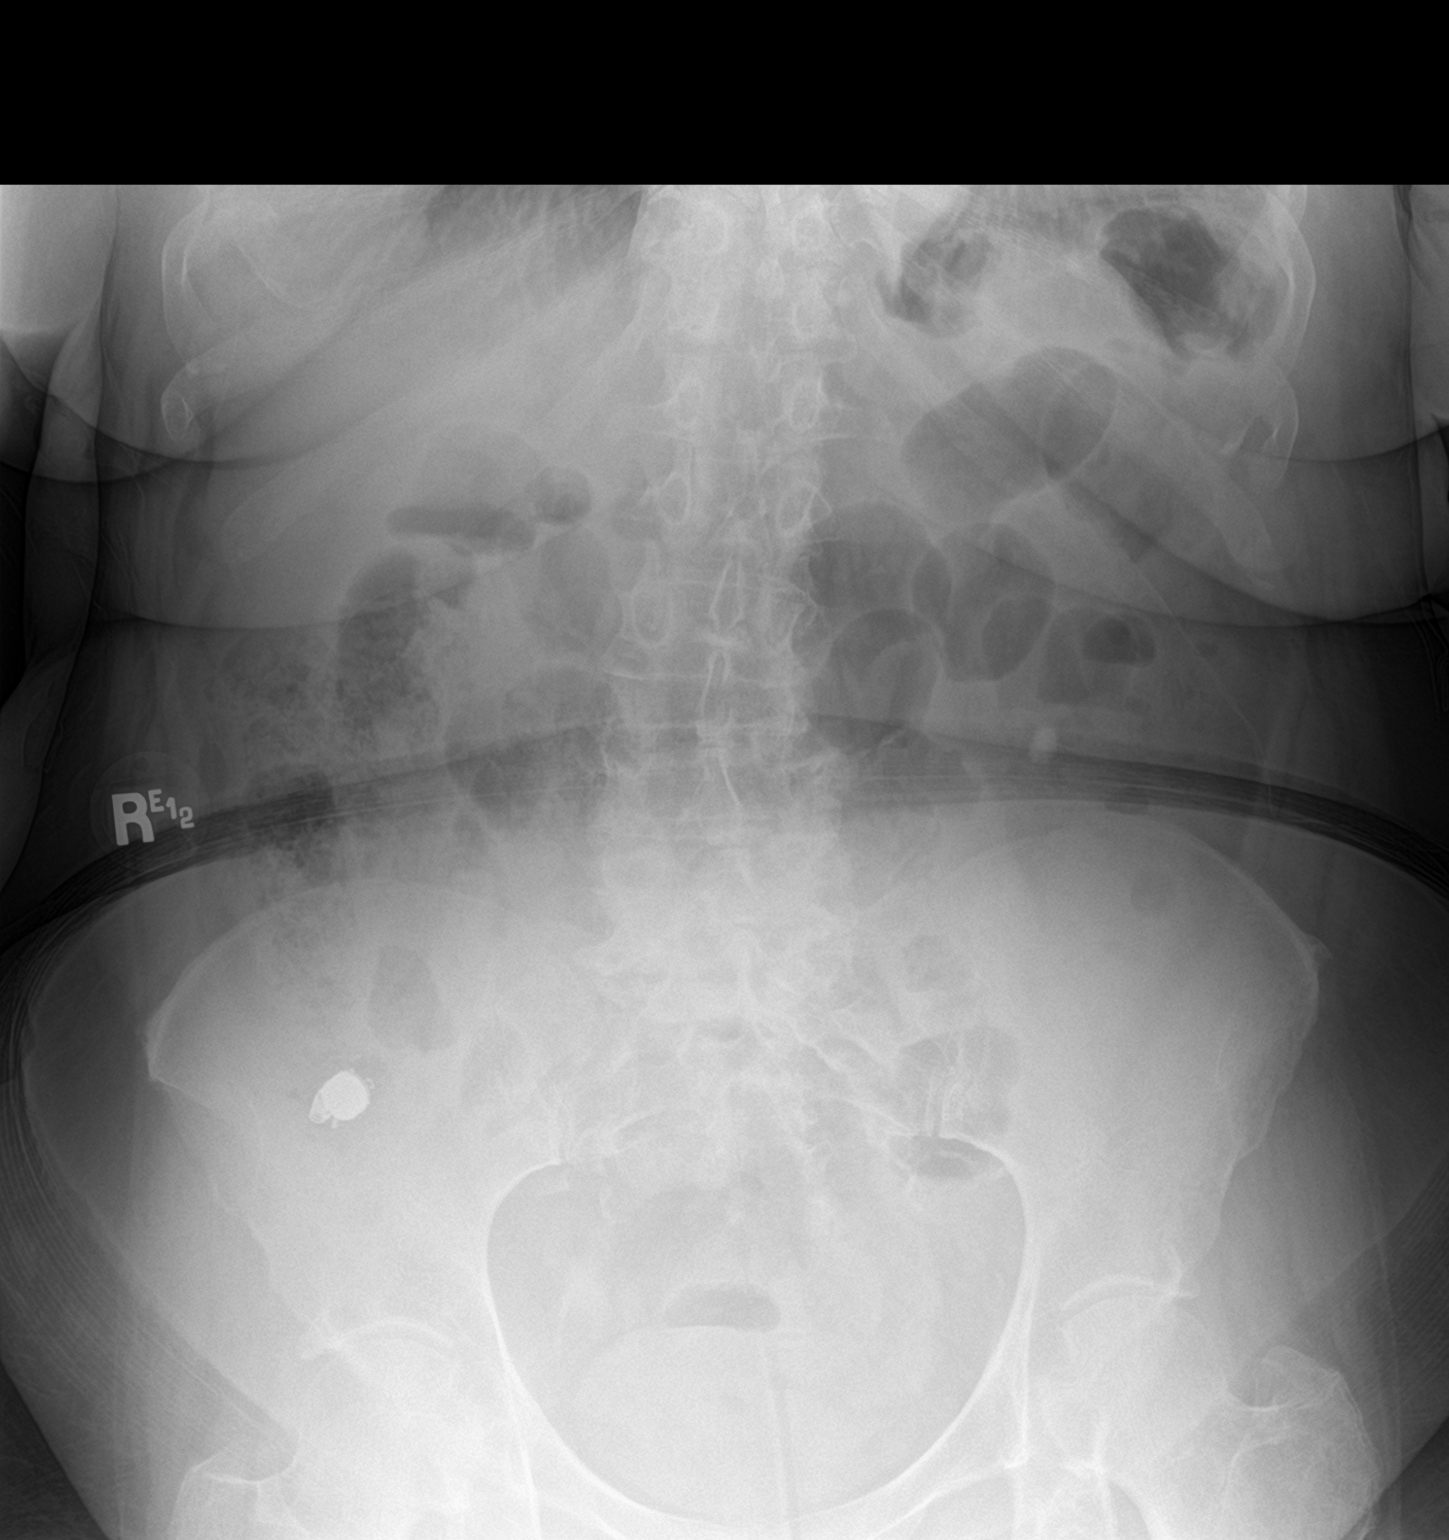
[im 2/3]
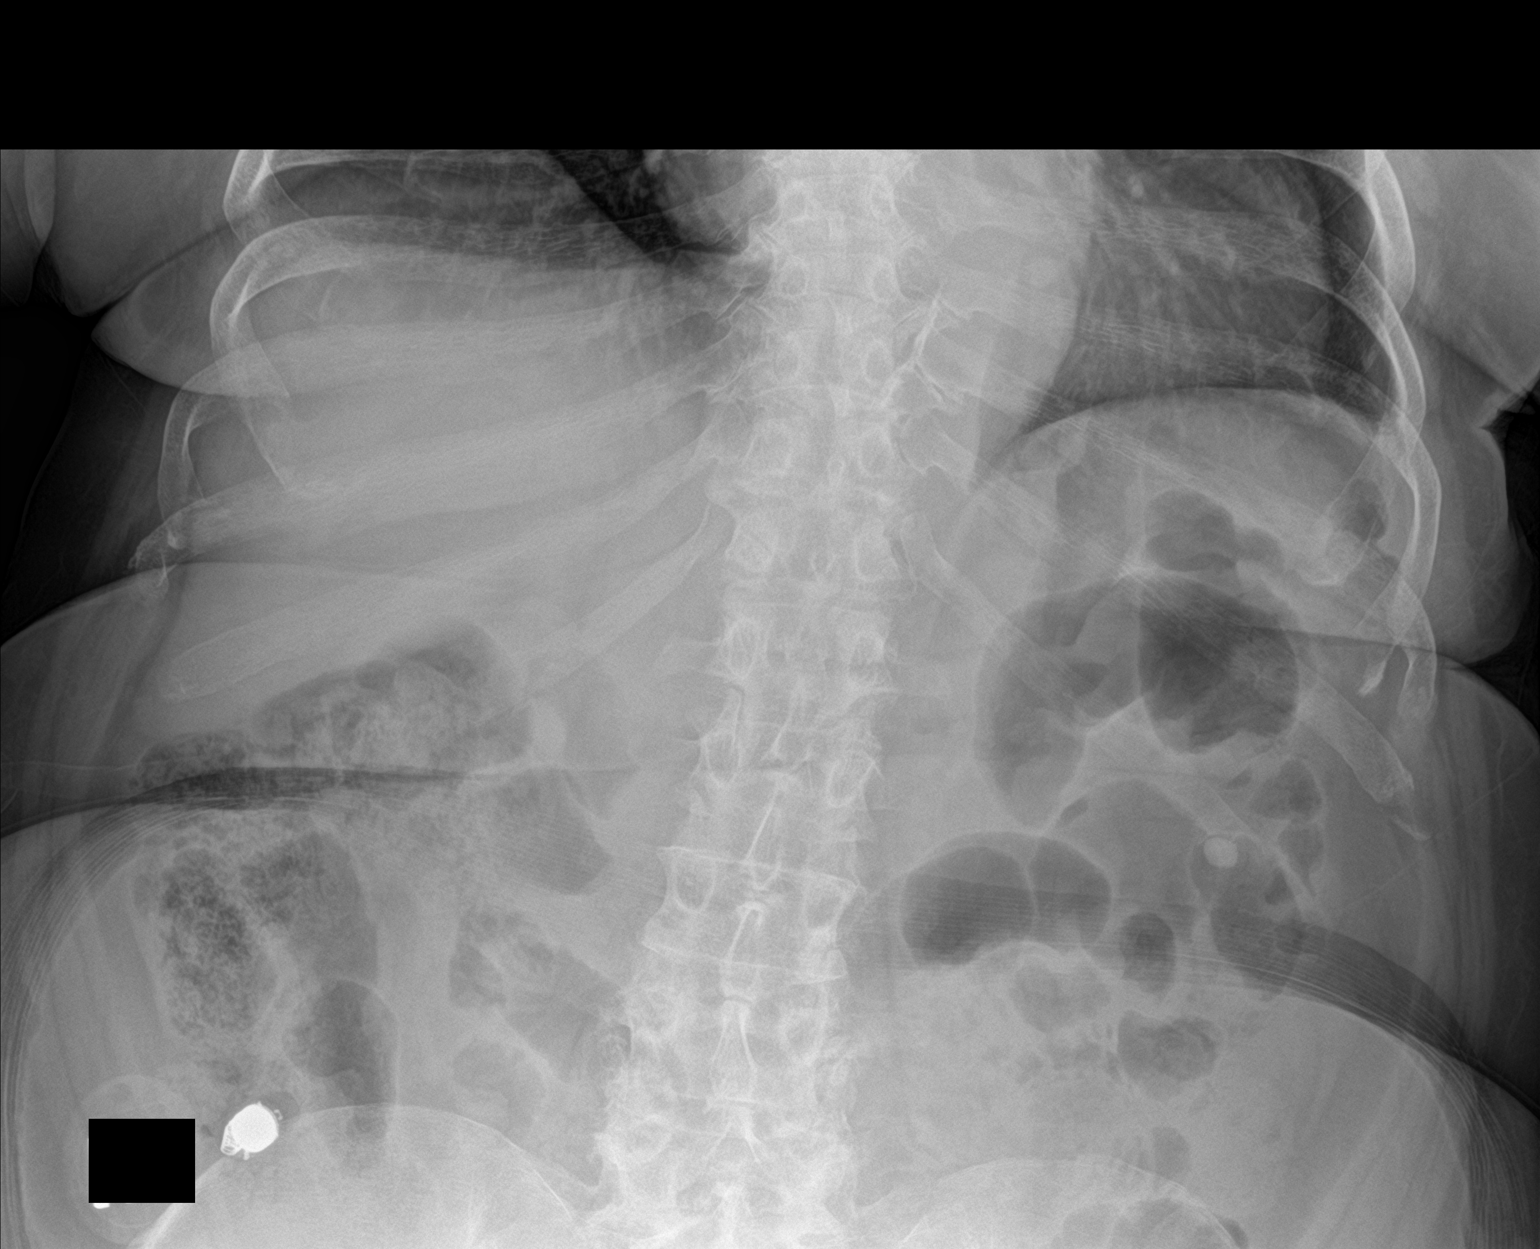
[im 3/3]
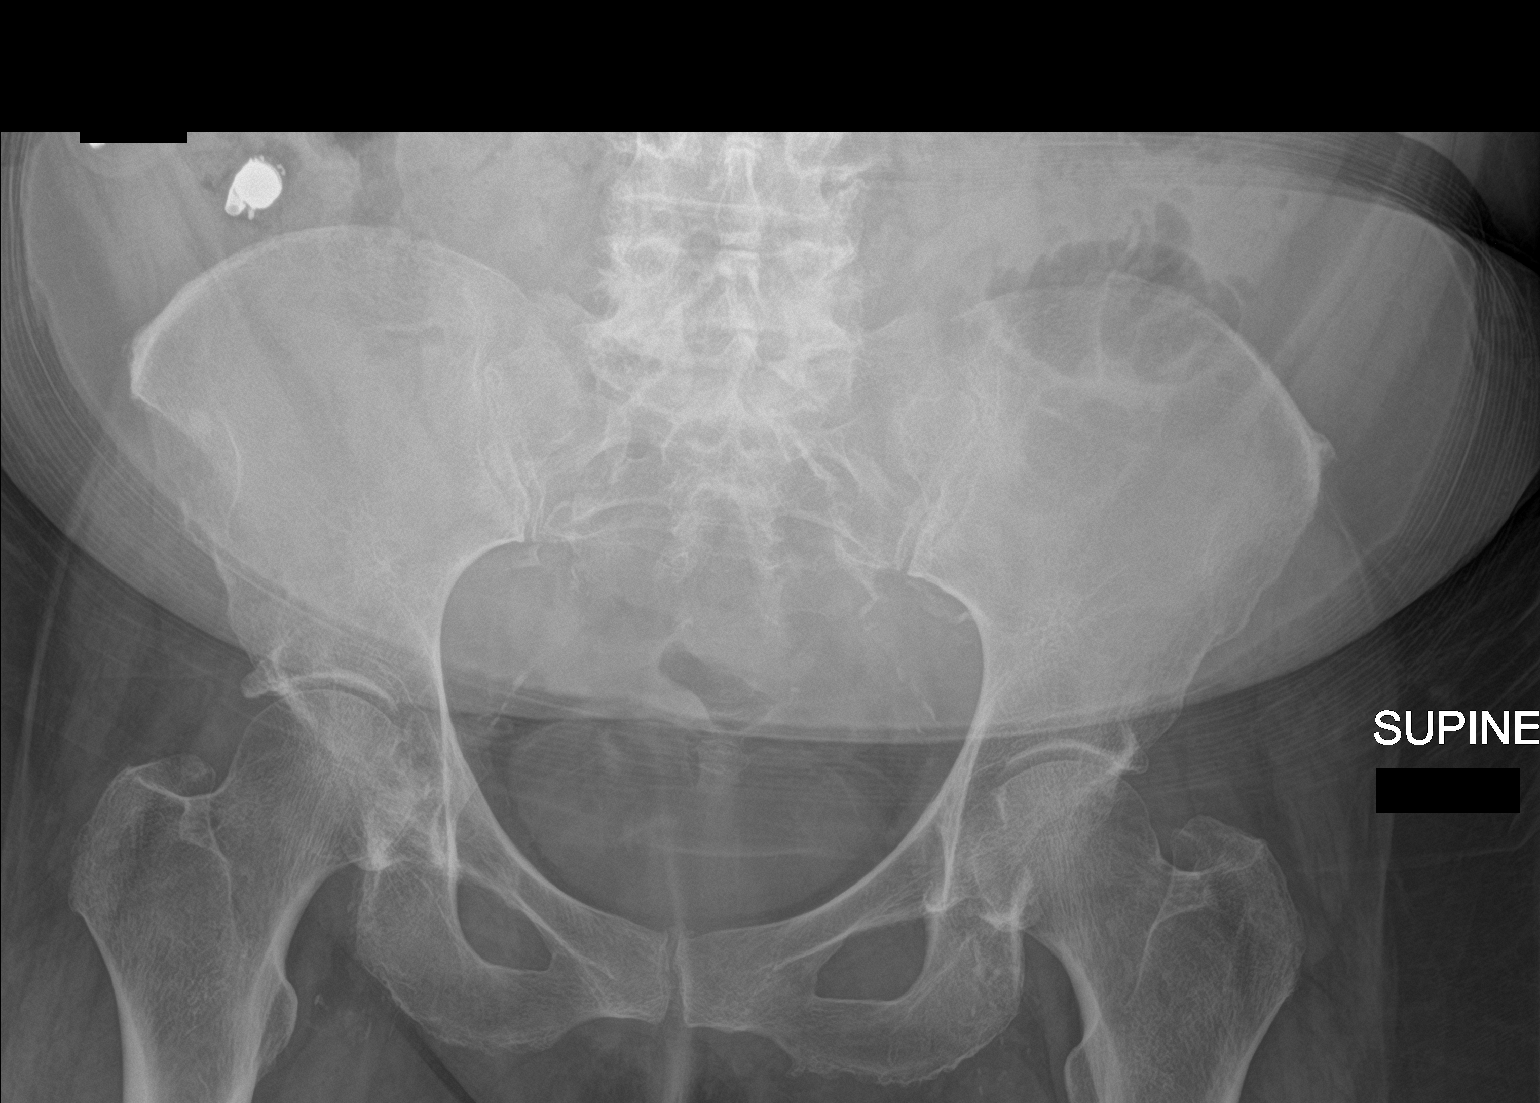

[3 of 3 positions shown; findings below may reference images not displayed]

FINDINGS: The capsule endoscope is visualized in the right lower quadrant,
projecting over the cecum.

Mild degenerative arthropathy of the hips. Lower lumbar spondylosis
lower thoracic spondylosis.

Nonspecific 9 mm oval-shaped calcification projecting over the left
abdomen

Potential mildly dilated small bowel loops in the left abdomen.
IMPRESSION: 1. Capsule endoscope in the right lower quadrant projecting over the
cecum.
2. Several mildly dilated loops of small bowel in the left abdomen,
significance uncertain.
3. Oval-shaped density projecting over the left abdomen, possibly a
renal calculus or vascular calcification, proximally in the bowel.
4. Thoracolumbar spondylosis.

## 2022-02-28 IMAGING — DX DG ABDOMEN 1V
2 series · 2 of 2 positions shown · non-contrast
Comparison: Radiograph 03/11/2021

CLINICAL DATA: Acute abdominal pain

EXAM:
ABDOMEN - 1 VIEW

[abdomen supine (1 of 2)]
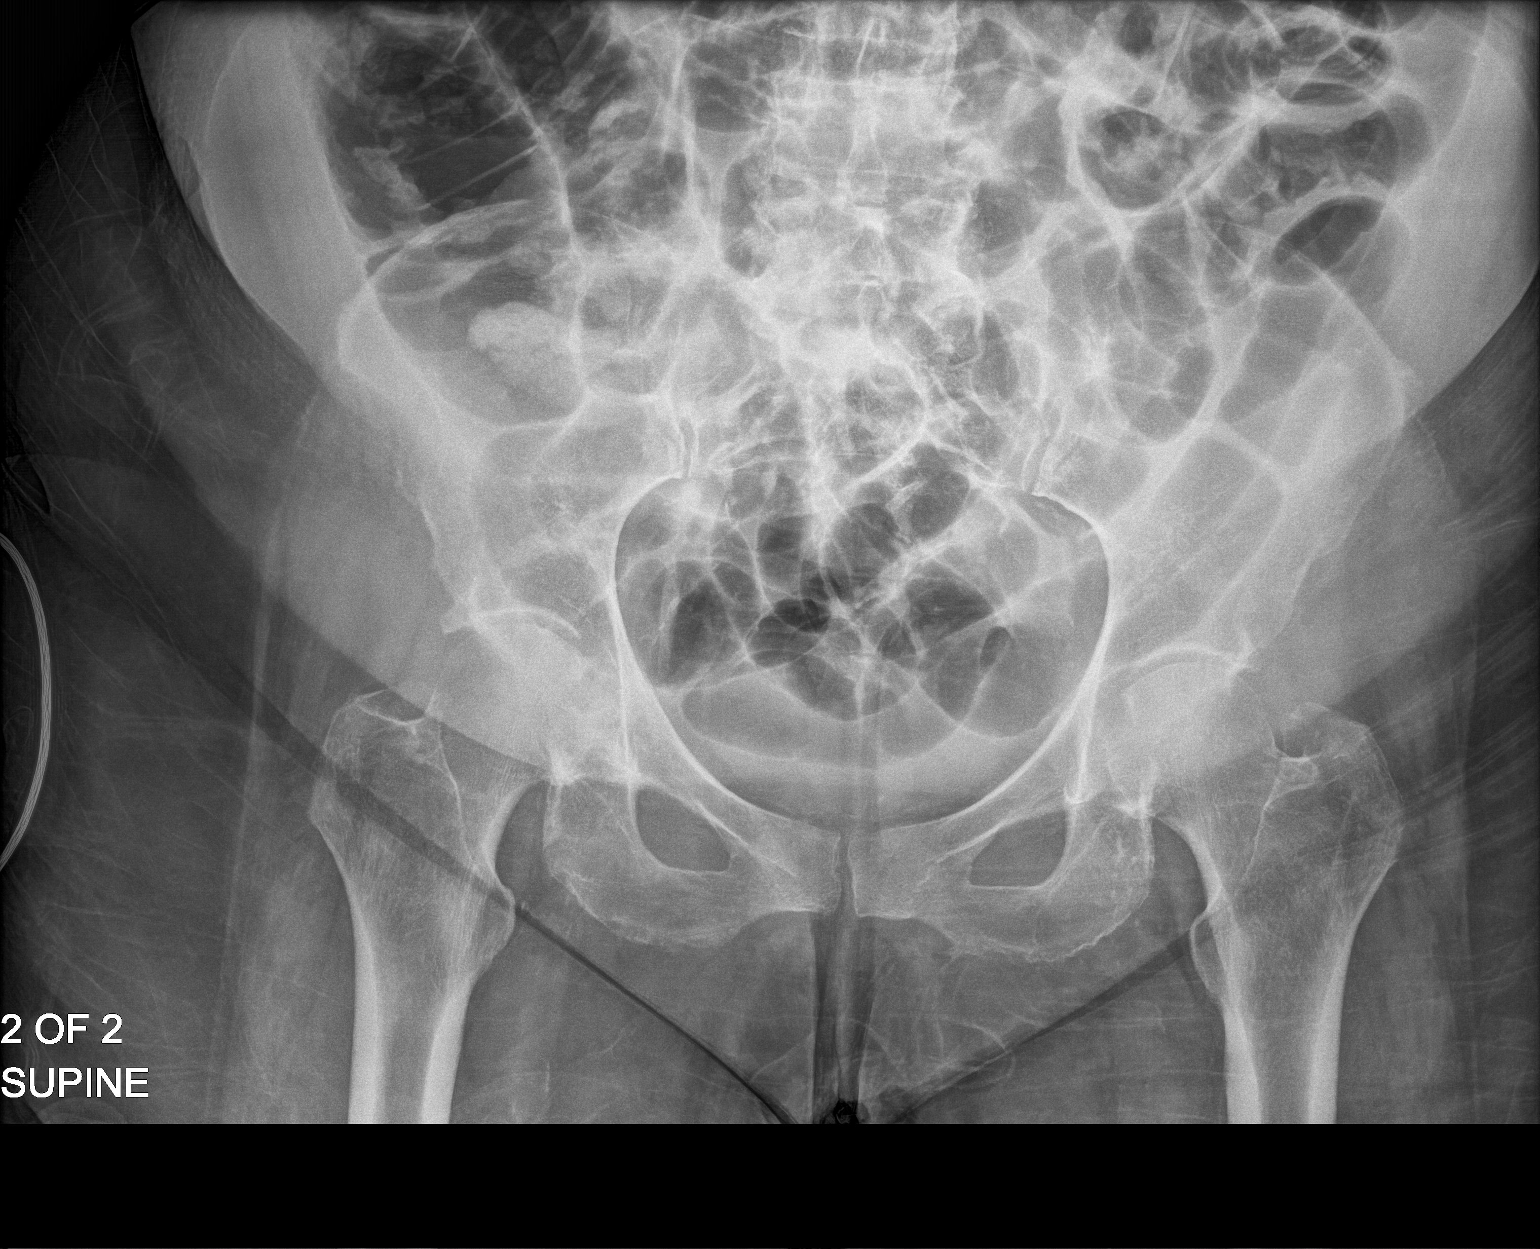

[abdomen supine (2 of 2)]
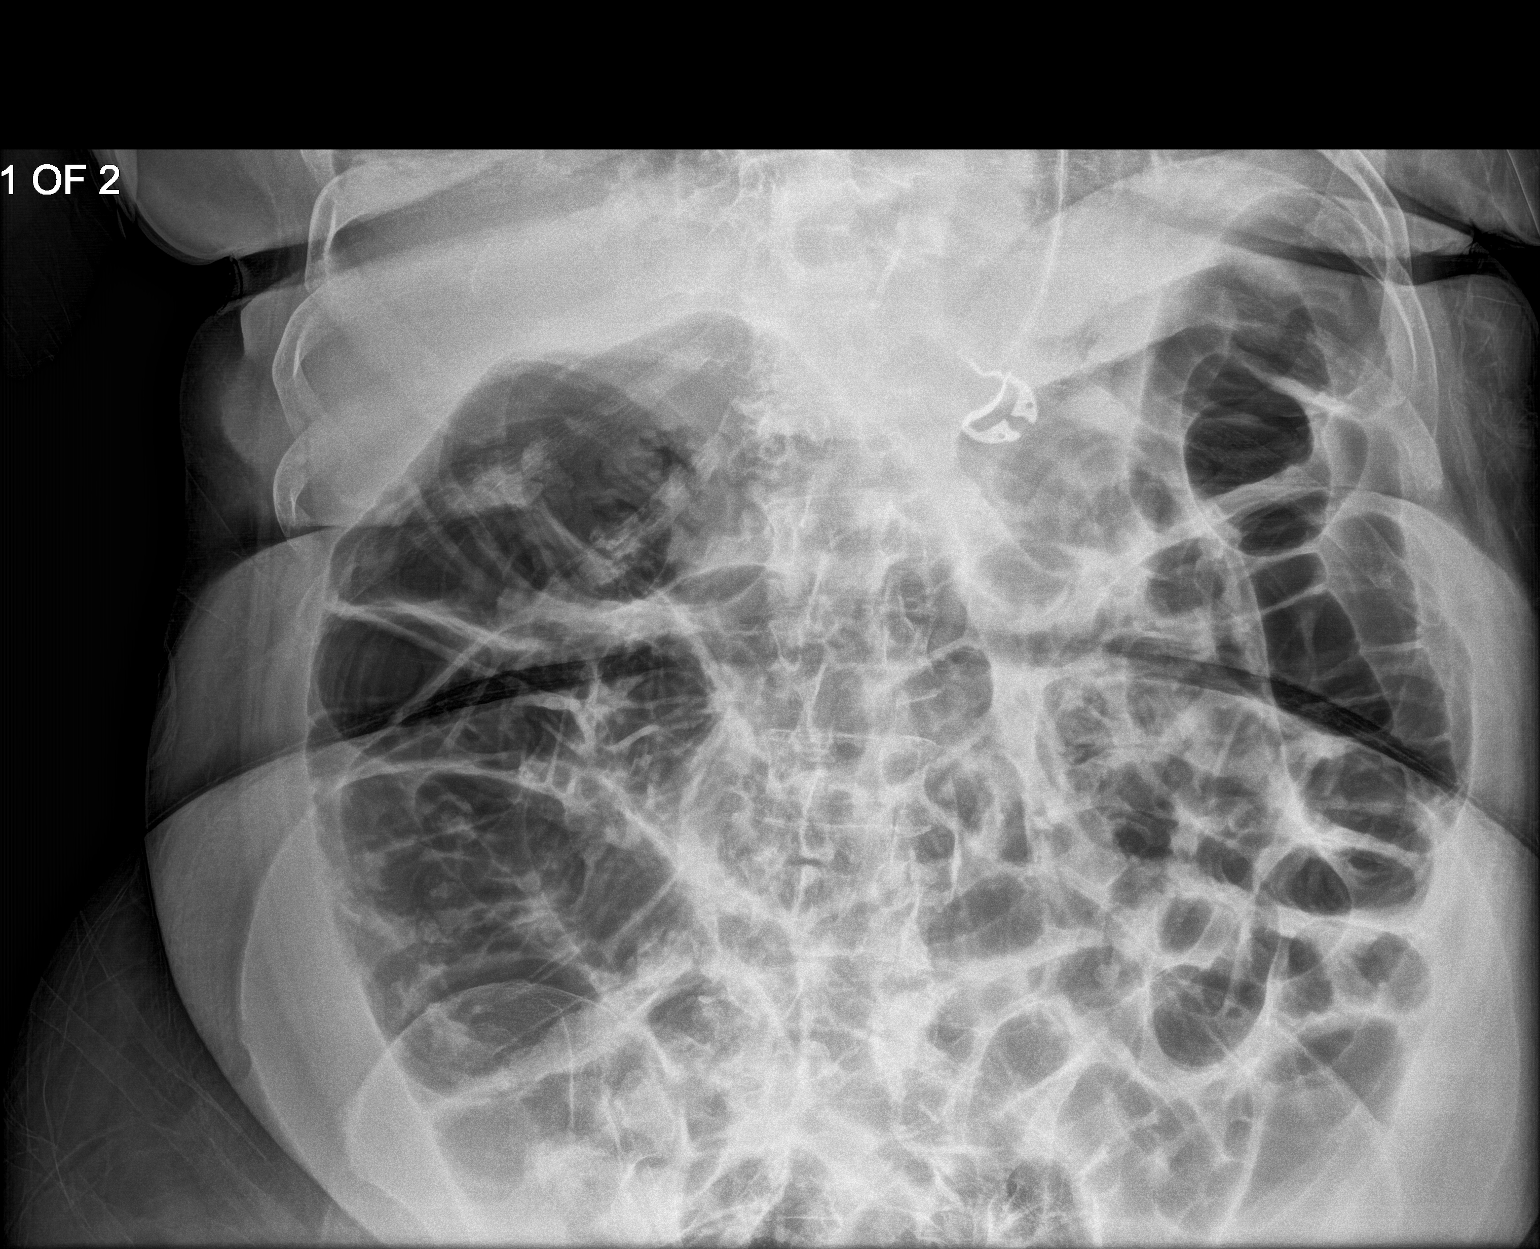

[2 of 2 positions shown; findings below may reference images not displayed]

FINDINGS: There is diffuse gaseous distension of small and large bowel, with
some dilated loops. There are no findings on supine radiograph to
suggest pneumoperitoneum. No acute osseous abnormality.
IMPRESSION: Diffuse gaseous distension of small and large bowel, with some
dilated loops.

## 2022-03-13 ENCOUNTER — Inpatient Hospital Stay: Payer: Medicare Other

## 2022-03-13 ENCOUNTER — Inpatient Hospital Stay: Payer: Medicare Other | Attending: Oncology

## 2022-03-13 DIAGNOSIS — D509 Iron deficiency anemia, unspecified: Secondary | ICD-10-CM | POA: Insufficient documentation

## 2022-03-13 DIAGNOSIS — Z79899 Other long term (current) drug therapy: Secondary | ICD-10-CM | POA: Insufficient documentation

## 2022-03-13 LAB — CBC WITH DIFFERENTIAL/PLATELET
Abs Immature Granulocytes: 0.04 10*3/uL (ref 0.00–0.07)
Basophils Absolute: 0 10*3/uL (ref 0.0–0.1)
Basophils Relative: 1 %
Eosinophils Absolute: 0.1 10*3/uL (ref 0.0–0.5)
Eosinophils Relative: 1 %
HCT: 36.5 % (ref 36.0–46.0)
Hemoglobin: 11.7 g/dL — ABNORMAL LOW (ref 12.0–15.0)
Immature Granulocytes: 1 %
Lymphocytes Relative: 24 %
Lymphs Abs: 1.8 10*3/uL (ref 0.7–4.0)
MCH: 27.3 pg (ref 26.0–34.0)
MCHC: 32.1 g/dL (ref 30.0–36.0)
MCV: 85.3 fL (ref 80.0–100.0)
Monocytes Absolute: 0.5 10*3/uL (ref 0.1–1.0)
Monocytes Relative: 6 %
Neutro Abs: 5.1 10*3/uL (ref 1.7–7.7)
Neutrophils Relative %: 67 %
Platelets: 295 10*3/uL (ref 150–400)
RBC: 4.28 MIL/uL (ref 3.87–5.11)
RDW: 16.9 % — ABNORMAL HIGH (ref 11.5–15.5)
WBC: 7.5 10*3/uL (ref 4.0–10.5)
nRBC: 0 % (ref 0.0–0.2)

## 2022-03-13 LAB — FERRITIN: Ferritin: 36 ng/mL (ref 11–307)

## 2022-03-13 LAB — IRON AND TIBC
Iron: 76 ug/dL (ref 28–170)
Saturation Ratios: 22 % (ref 10.4–31.8)
TIBC: 342 ug/dL (ref 250–450)
UIBC: 266 ug/dL

## 2022-03-13 LAB — SAMPLE TO BLOOD BANK

## 2022-03-13 MED ORDER — OCTREOTIDE ACETATE 30 MG IM KIT
30.0000 mg | PACK | Freq: Once | INTRAMUSCULAR | Status: AC
  Administered 2022-03-13: 30 mg via INTRAMUSCULAR
  Filled 2022-03-13: qty 1

## 2022-04-13 ENCOUNTER — Inpatient Hospital Stay: Payer: Medicare Other

## 2022-04-13 ENCOUNTER — Inpatient Hospital Stay: Payer: Medicare Other | Attending: Oncology

## 2022-04-13 DIAGNOSIS — D509 Iron deficiency anemia, unspecified: Secondary | ICD-10-CM

## 2022-04-13 DIAGNOSIS — Z79899 Other long term (current) drug therapy: Secondary | ICD-10-CM | POA: Insufficient documentation

## 2022-04-13 LAB — CBC WITH DIFFERENTIAL/PLATELET
Abs Immature Granulocytes: 0.02 10*3/uL (ref 0.00–0.07)
Basophils Absolute: 0 10*3/uL (ref 0.0–0.1)
Basophils Relative: 0 %
Eosinophils Absolute: 0 10*3/uL (ref 0.0–0.5)
Eosinophils Relative: 1 %
HCT: 34.9 % — ABNORMAL LOW (ref 36.0–46.0)
Hemoglobin: 10.9 g/dL — ABNORMAL LOW (ref 12.0–15.0)
Immature Granulocytes: 0 %
Lymphocytes Relative: 25 %
Lymphs Abs: 1.3 10*3/uL (ref 0.7–4.0)
MCH: 26.5 pg (ref 26.0–34.0)
MCHC: 31.2 g/dL (ref 30.0–36.0)
MCV: 84.9 fL (ref 80.0–100.0)
Monocytes Absolute: 0.5 10*3/uL (ref 0.1–1.0)
Monocytes Relative: 9 %
Neutro Abs: 3.3 10*3/uL (ref 1.7–7.7)
Neutrophils Relative %: 65 %
Platelets: 295 10*3/uL (ref 150–400)
RBC: 4.11 MIL/uL (ref 3.87–5.11)
RDW: 15.3 % (ref 11.5–15.5)
WBC: 5.2 10*3/uL (ref 4.0–10.5)
nRBC: 0 % (ref 0.0–0.2)

## 2022-04-13 LAB — IRON AND TIBC
Iron: 225 ug/dL — ABNORMAL HIGH (ref 28–170)
Saturation Ratios: 64 % — ABNORMAL HIGH (ref 10.4–31.8)
TIBC: 353 ug/dL (ref 250–450)
UIBC: 128 ug/dL

## 2022-04-13 LAB — SAMPLE TO BLOOD BANK

## 2022-04-13 LAB — FERRITIN: Ferritin: 22 ng/mL (ref 11–307)

## 2022-04-13 MED ORDER — OCTREOTIDE ACETATE 30 MG IM KIT
30.0000 mg | PACK | Freq: Once | INTRAMUSCULAR | Status: AC
  Administered 2022-04-13: 30 mg via INTRAMUSCULAR
  Filled 2022-04-13: qty 1

## 2022-05-15 ENCOUNTER — Inpatient Hospital Stay: Payer: Medicare Other | Attending: Oncology

## 2022-05-15 ENCOUNTER — Inpatient Hospital Stay: Payer: Medicare Other

## 2022-05-15 DIAGNOSIS — D509 Iron deficiency anemia, unspecified: Secondary | ICD-10-CM | POA: Diagnosis present

## 2022-05-15 DIAGNOSIS — Z79899 Other long term (current) drug therapy: Secondary | ICD-10-CM | POA: Insufficient documentation

## 2022-05-15 LAB — CBC WITH DIFFERENTIAL/PLATELET
Abs Immature Granulocytes: 0.01 10*3/uL (ref 0.00–0.07)
Basophils Absolute: 0 10*3/uL (ref 0.0–0.1)
Basophils Relative: 1 %
Eosinophils Absolute: 0 10*3/uL (ref 0.0–0.5)
Eosinophils Relative: 1 %
HCT: 35.4 % — ABNORMAL LOW (ref 36.0–46.0)
Hemoglobin: 11.3 g/dL — ABNORMAL LOW (ref 12.0–15.0)
Immature Granulocytes: 0 %
Lymphocytes Relative: 29 %
Lymphs Abs: 1.3 10*3/uL (ref 0.7–4.0)
MCH: 26.3 pg (ref 26.0–34.0)
MCHC: 31.9 g/dL (ref 30.0–36.0)
MCV: 82.5 fL (ref 80.0–100.0)
Monocytes Absolute: 0.5 10*3/uL (ref 0.1–1.0)
Monocytes Relative: 11 %
Neutro Abs: 2.6 10*3/uL (ref 1.7–7.7)
Neutrophils Relative %: 58 %
Platelets: 261 10*3/uL (ref 150–400)
RBC: 4.29 MIL/uL (ref 3.87–5.11)
RDW: 15 % (ref 11.5–15.5)
WBC: 4.4 10*3/uL (ref 4.0–10.5)
nRBC: 0 % (ref 0.0–0.2)

## 2022-05-15 LAB — IRON AND TIBC
Iron: 50 ug/dL (ref 28–170)
Saturation Ratios: 13 % (ref 10.4–31.8)
TIBC: 372 ug/dL (ref 250–450)
UIBC: 322 ug/dL

## 2022-05-15 LAB — SAMPLE TO BLOOD BANK

## 2022-05-15 LAB — FERRITIN: Ferritin: 8 ng/mL — ABNORMAL LOW (ref 11–307)

## 2022-05-15 MED ORDER — OCTREOTIDE ACETATE 30 MG IM KIT
30.0000 mg | PACK | Freq: Once | INTRAMUSCULAR | Status: AC
  Administered 2022-05-15: 30 mg via INTRAMUSCULAR
  Filled 2022-05-15: qty 1

## 2022-06-12 MED FILL — Iron Sucrose Inj 20 MG/ML (Fe Equiv): INTRAVENOUS | Qty: 10 | Status: AC

## 2022-06-13 ENCOUNTER — Inpatient Hospital Stay: Payer: 59

## 2022-06-13 ENCOUNTER — Inpatient Hospital Stay: Payer: 59 | Admitting: Oncology

## 2022-06-20 ENCOUNTER — Telehealth: Payer: Self-pay | Admitting: Gastroenterology

## 2022-06-20 NOTE — Telephone Encounter (Signed)
Call pt when May sched opens to get her sched

## 2022-06-21 ENCOUNTER — Encounter: Payer: Self-pay | Admitting: Oncology

## 2022-06-21 MED FILL — Iron Sucrose Inj 20 MG/ML (Fe Equiv): INTRAVENOUS | Qty: 10 | Status: AC

## 2022-06-22 ENCOUNTER — Encounter: Payer: Self-pay | Admitting: Oncology

## 2022-06-22 ENCOUNTER — Inpatient Hospital Stay: Payer: 59

## 2022-06-22 ENCOUNTER — Inpatient Hospital Stay: Payer: 59 | Attending: Oncology

## 2022-06-22 ENCOUNTER — Inpatient Hospital Stay (HOSPITAL_BASED_OUTPATIENT_CLINIC_OR_DEPARTMENT_OTHER): Payer: 59 | Admitting: Oncology

## 2022-06-22 VITALS — BP 135/96 | HR 95 | Temp 96.5°F | Resp 17 | Wt 177.8 lb

## 2022-06-22 DIAGNOSIS — D509 Iron deficiency anemia, unspecified: Secondary | ICD-10-CM

## 2022-06-22 DIAGNOSIS — Z79899 Other long term (current) drug therapy: Secondary | ICD-10-CM | POA: Insufficient documentation

## 2022-06-22 DIAGNOSIS — Z853 Personal history of malignant neoplasm of breast: Secondary | ICD-10-CM | POA: Diagnosis not present

## 2022-06-22 LAB — SAMPLE TO BLOOD BANK

## 2022-06-22 LAB — IRON AND TIBC
Iron: 28 ug/dL (ref 28–170)
Saturation Ratios: 7 % — ABNORMAL LOW (ref 10.4–31.8)
TIBC: 399 ug/dL (ref 250–450)
UIBC: 371 ug/dL

## 2022-06-22 LAB — CBC WITH DIFFERENTIAL/PLATELET
Abs Immature Granulocytes: 0.01 10*3/uL (ref 0.00–0.07)
Basophils Absolute: 0 10*3/uL (ref 0.0–0.1)
Basophils Relative: 1 %
Eosinophils Absolute: 0 10*3/uL (ref 0.0–0.5)
Eosinophils Relative: 1 %
HCT: 34.4 % — ABNORMAL LOW (ref 36.0–46.0)
Hemoglobin: 11.1 g/dL — ABNORMAL LOW (ref 12.0–15.0)
Immature Granulocytes: 0 %
Lymphocytes Relative: 29 %
Lymphs Abs: 1.6 10*3/uL (ref 0.7–4.0)
MCH: 26.1 pg (ref 26.0–34.0)
MCHC: 32.3 g/dL (ref 30.0–36.0)
MCV: 80.8 fL (ref 80.0–100.0)
Monocytes Absolute: 0.5 10*3/uL (ref 0.1–1.0)
Monocytes Relative: 10 %
Neutro Abs: 3.2 10*3/uL (ref 1.7–7.7)
Neutrophils Relative %: 59 %
Platelets: 284 10*3/uL (ref 150–400)
RBC: 4.26 MIL/uL (ref 3.87–5.11)
RDW: 15.6 % — ABNORMAL HIGH (ref 11.5–15.5)
WBC: 5.4 10*3/uL (ref 4.0–10.5)
nRBC: 0 % (ref 0.0–0.2)

## 2022-06-22 LAB — FERRITIN: Ferritin: 6 ng/mL — ABNORMAL LOW (ref 11–307)

## 2022-06-22 MED ORDER — OCTREOTIDE ACETATE 30 MG IM KIT
30.0000 mg | PACK | Freq: Once | INTRAMUSCULAR | Status: AC
  Administered 2022-06-22: 30 mg via INTRAMUSCULAR
  Filled 2022-06-22: qty 1

## 2022-06-22 NOTE — Progress Notes (Signed)
[place]  [phone]) [phone] [phone]) [phone]  ID: Hailey Farrell OB: 04-01-1951  MR#: [id number]  NKN#:[id number]  Patient Care Team: Letta Median, MD as PCP - General (Family Medicine) Lloyd Huger, MD as Consulting Physician (Oncology)  CHIEF COMPLAINT: Iron deficiency anemia  INTERVAL HISTORY: Patient returns to clinic today for repeat laboratory work, further evaluation, and consideration of additional IV Venofer and IM octreotide.  She currently feels well and is asymptomatic.  She has had no further GI bleed. She does not complain of any weakness or fatigue.  She has no neurologic complaints.  She denies any recent fevers or illnesses.  She has a good appetite and denies weight loss.    She denies any chest pain, shortness of breath, cough, or hemoptysis.  She denies any nausea, vomiting, constipation, or diarrhea. She has no urinary complaints.  Patient offers no specific complaints today.  REVIEW OF SYSTEMS:   Review of Systems  Constitutional: Negative.  Negative for fever, malaise/fatigue and weight loss.  Respiratory: Negative.  Negative for cough and shortness of breath.   Cardiovascular: Negative.  Negative for chest pain and leg swelling.  Gastrointestinal: Negative.  Negative for abdominal pain, blood in stool and melena.  Genitourinary: Negative.  Negative for hematuria.  Musculoskeletal: Negative.  Negative for back pain.  Skin: Negative.  Negative for rash.  Neurological: Negative.  Negative for dizziness, focal weakness, weakness and headaches.  Psychiatric/Behavioral: Negative.  The patient is not nervous/anxious.     As per HPI. Otherwise, a complete review of systems is negative.  PAST MEDICAL HISTORY: Past Medical History:  Diagnosis Date   Anemia    Arthritis    Breast cancer ([place]) 1990   Right   COPD (chronic obstructive pulmonary disease) ([place])    Dyspnea    DOE   Elevated lipids    Hypertension    Lower  extremity edema    Migraines    Migraines    Personal history of radiation therapy 1990   Breast   Restless leg syndrome    Rotator cuff injury     PAST SURGICAL HISTORY: Past Surgical History:  Procedure Laterality Date   ABDOMINAL HYSTERECTOMY     BACK SURGERY  08/2017   CERVICAL FUSION   BREAST BIOPSY Right 1990   LUMPECTOMY.  took lymph nodes   BREAST LUMPECTOMY     COLONOSCOPY WITH PROPOFOL N/A 07/20/2017   Procedure: COLONOSCOPY WITH PROPOFOL;  Surgeon: Virgel Manifold, MD;  Location: [facility] ENDOSCOPY;  Service: Endoscopy;  Laterality: N/A;   COLONOSCOPY WITH PROPOFOL N/A 01/27/2022   Procedure: COLONOSCOPY WITH PROPOFOL;  Surgeon: [name], MD;  Location: [facility] ENDOSCOPY;  Service: Gastroenterology;  Laterality: N/A;   ENTEROSCOPY N/A 08/08/2017   Procedure: ENTEROSCOPY;  Surgeon: Lin Landsman, MD;  Location: [facility] ENDOSCOPY;  Service: Gastroenterology;  Laterality: N/A;   ENTEROSCOPY N/A 05/07/2020   Procedure: ENTEROSCOPY with Adult colonoscope;  Surgeon: Virgel Manifold, MD;  Location: [facility] ENDOSCOPY;  Service: Endoscopy;  Laterality: N/A;   ENTEROSCOPY N/A 03/17/2021   Procedure: ENTEROSCOPY;  Surgeon: Virgel Manifold, MD;  Location: [facility] ENDOSCOPY;  Service: Endoscopy;  Laterality: N/A;  PUSH   ENTEROSCOPY N/A 07/13/2021   Procedure: ENTEROSCOPY;  Surgeon: Lin Landsman, MD;  Location: [facility] ENDOSCOPY;  Service: Gastroenterology;  Laterality: N/A;   ENTEROSCOPY N/A 10/26/2021   Procedure: ENTEROSCOPY;  Surgeon: Lin Landsman, MD;  Location: [facility] ENDOSCOPY;  Service: Gastroenterology;  Laterality: N/A;  push   ESOPHAGOGASTRODUODENOSCOPY  Left 04/13/2017   Procedure: ESOPHAGOGASTRODUODENOSCOPY (EGD);  Surgeon: Virgel Manifold, MD;  Location: [facility] ENDOSCOPY;  Service: Endoscopy;  Laterality: Left;   ESOPHAGOGASTRODUODENOSCOPY (EGD) WITH PROPOFOL N/A 07/20/2017   Procedure: ESOPHAGOGASTRODUODENOSCOPY (EGD) WITH PROPOFOL;  Surgeon: Virgel Manifold, MD;  Location: [facility] ENDOSCOPY;  Service: Endoscopy;  Laterality: N/A;   ESOPHAGOGASTRODUODENOSCOPY (EGD) WITH PROPOFOL N/A 12/12/2017   Procedure: ESOPHAGOGASTRODUODENOSCOPY (EGD) WITH PROPOFOL;  Surgeon: Virgel Manifold, MD;  Location: [facility] ENDOSCOPY;  Service: Endoscopy;  Laterality: N/A;   ESOPHAGOGASTRODUODENOSCOPY (EGD) WITH PROPOFOL N/A 12/23/2020   Procedure: ESOPHAGOGASTRODUODENOSCOPY (EGD) WITH PROPOFOL;  Surgeon: Virgel Manifold, MD;  Location: [facility] ENDOSCOPY;  Service: Endoscopy;  Laterality: N/A;   ESOPHAGOGASTRODUODENOSCOPY (EGD) WITH PROPOFOL N/A 01/27/2022   Procedure: ESOPHAGOGASTRODUODENOSCOPY (EGD) WITH PROPOFOL;  Surgeon: [name], MD;  Location[facility] ENDOSCOPY;  Service: Gastroenterology;  Laterality: N/A;   GIVENS CAPSULE STUDY N/A 03/10/2021   Procedure: GIVENS CAPSULE STUDY;  Surgeon: Virgel Manifold, MD;  Location: [facility] ENDOSCOPY;  Service: Endoscopy;  Laterality: N/A;   MEDIAL PARTIAL KNEE REPLACEMENT Left 1990   torn ligament. no knee replacement at that time   PARTIAL HYSTERECTOMY     SHOULDER ARTHROSCOPY WITH OPEN ROTATOR CUFF REPAIR Left 03/01/2016   Procedure: SHOULDER ARTHROSCOPY WITH OPEN ROTATOR CUFF REPAIR;  Surgeon: Thornton Park, MD;  Location: [facility] ORS;  Service: Orthopedics;  Laterality: Left;   TOTAL KNEE ARTHROPLASTY Left 02/14/2018   Procedure: TOTAL KNEE ARTHROPLASTY;  Surgeon: Thornton Park, MD;  Location: [facility] ORS;  Service: Orthopedics;  Laterality: Left;    FAMILY HISTORY: Family History  Problem Relation Age of Onset   CAD Mother    CAD Sister    Breast cancer Neg Hx     ADVANCED DIRECTIVES (Y/N):  N  HEALTH MAINTENANCE: Social History   Tobacco Use   Smoking status: Some Days    Packs/day: 0.25    Years: 50.00    Total pack years: 12.50    Types: Cigarettes    Last attempt to quit: 03/13/2016    Years since quitting: 6.2   Smokeless tobacco: Never  Vaping Use   Vaping Use: Never used  Substance  Use Topics   Alcohol use: No   Drug use: No     Colonoscopy:  PAP:  Bone density:  Lipid panel:  Allergies  Allergen Reactions   Latex Rash    Welts over body    Current Outpatient Medications  Medication Sig Dispense Refill   acetaminophen (TYLENOL) 650 MG CR tablet Take 650 mg by mouth every 8 (eight) hours as needed for pain.     albuterol (VENTOLIN HFA) 108 (90 Base) MCG/ACT inhaler Inhale 2 puffs into the lungs every 6 (six) hours as needed for wheezing.     amitriptyline (ELAVIL) 25 MG tablet Take 25 mg by mouth at bedtime.     budesonide-formoterol (SYMBICORT) 160-4.5 MCG/ACT inhaler Inhale 2 puffs into the lungs 2 (two) times daily.     diphenhydramine-acetaminophen (TYLENOL PM) 25-500 MG TABS tablet Take 1-2 tablets by mouth at bedtime as needed (pain).     Fe Fum-Fe Poly-Vit C-Lactobac (FUSION PO) Take 1 tablet by mouth daily.     furosemide (LASIX) 20 MG tablet Take 20 mg daily by mouth.      lisinopril (PRINIVIL,ZESTRIL) 20 MG tablet Take 20 mg by mouth daily.     potassium chloride (MICRO-K) 10 MEQ CR capsule Take 10 mEq by mouth daily.     rOPINIRole (REQUIP) 2 MG tablet Take 2-4  mg by mouth at bedtime as needed (restless leg syndrome).     simvastatin (ZOCOR) 20 MG tablet Take 20 mg by mouth daily.      tiotropium (SPIRIVA) 18 MCG inhalation capsule Place 1 capsule (18 mcg total) into inhaler and inhale daily. 30 capsule 0   vitamin B-12 (CYANOCOBALAMIN) 1000 MCG tablet Take 1,000 mcg by mouth daily.     No current facility-administered medications for this visit.   Facility-Administered Medications Ordered in Other Visits  Medication Dose Route Frequency Provider Last Rate Last Admin   0.9 %  sodium chloride infusion   Intravenous Continuous Sindy Guadeloupe, MD 20 mL/hr at 10/26/21 1058 Continued from Pre-op at 10/26/21 1058    OBJECTIVE: Vitals:   06/22/22 1406  BP: (!) 135/96  Pulse: 95  Resp: 17  Temp: (!) 96.5 F (35.8 C)  SpO2: 99%     Body mass  index is 30.52 kg/m.    ECOG FS:0 - Asymptomatic  General: Well-developed, well-nourished, no acute distress. Eyes: Pink conjunctiva, anicteric sclera. HEENT: Normocephalic, moist mucous membranes. Lungs: No audible wheezing or coughing. Heart: Regular rate and rhythm. Abdomen: Soft, nontender, no obvious distention. Musculoskeletal: No edema, cyanosis, or clubbing. Neuro: Alert, answering all questions appropriately. Cranial nerves grossly intact. Skin: No rashes or petechiae noted. Psych: Normal affect.  LAB RESULTS:  Lab Results  Component Value Date   NA 141 01/27/2022   K 3.7 01/27/2022   CL 112 (H) 01/27/2022   CO2 25 01/27/2022   GLUCOSE 86 01/27/2022   BUN 10 01/27/2022   CREATININE 0.72 01/27/2022   CALCIUM 8.5 (L) 01/27/2022   PROT 6.2 (L) 01/25/2022   ALBUMIN 3.8 01/25/2022   AST 15 01/25/2022   ALT 13 01/25/2022   ALKPHOS 30 (L) 01/25/2022   BILITOT 0.5 01/25/2022   GFRNONAA >60 01/27/2022   GFRAA >60 02/16/2018    Lab Results  Component Value Date   WBC 5.4 06/22/2022   NEUTROABS 3.2 06/22/2022   HGB 11.1 (L) 06/22/2022   HCT 34.4 (L) 06/22/2022   MCV 80.8 06/22/2022   PLT 284 06/22/2022   Lab Results  Component Value Date   IRON 28 06/22/2022   TIBC 399 06/22/2022   IRONPCTSAT 7 (L) 06/22/2022   Lab Results  Component Value Date   FERRITIN 6 (L) 06/22/2022     STUDIES: No results found.   ASSESSMENT: Iron deficiency anemia.  PLAN:    1. 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  Argon plasma was used for hemostasis.  Patient's hemoglobin is stable at 11.1, but her iron stores remain decreased.  Return to clinic 5 times over the next 2 to 3 weeks to receive 200 mg IV Venofer.  Patient will then return to clinic in 4 months with repeat laboratory work, further evaluation, and continuation of treatment if needed.   2.  History of AVMs: Appreciate GI  input.  Continue 30 mg M octreotide monthly for at least an additional 6 months.  Follow-up as above.  I spent a total of 30 minutes reviewing chart data, face-to-face evaluation with the patient, counseling and coordination of care as detailed above.  Patient expressed understanding and was in agreement with this plan. She also understands that She can call clinic at any time with any questions, concerns, or complaints.    Lloyd Huger, MD   06/23/2022 8:46 AM

## 2022-06-22 NOTE — Progress Notes (Signed)
Denies dizziness. Some dyspnea with exertion; uses her inhalers. No visible blood. Denies weakness or fatigue.

## 2022-06-23 ENCOUNTER — Encounter: Payer: Self-pay | Admitting: Oncology

## 2022-06-23 ENCOUNTER — Telehealth: Payer: Self-pay

## 2022-06-23 NOTE — Telephone Encounter (Signed)
Per Dr. Devota Pace, please schedule her in Feb for follow up appt, ok to overbook, [employer] and left a message for call back to schedule a appointment

## 2022-06-25 IMAGING — CR DG CHEST 2V
1 series · 2 of 2 positions shown · non-contrast
Comparison: 02/17/2018

CLINICAL DATA: Weakness and dizziness

EXAM:
CHEST - 2 VIEW

[Series 1: dg chest 2 view · 0.14mm/px · 2 of 2 slices shown]
[im 1/2]
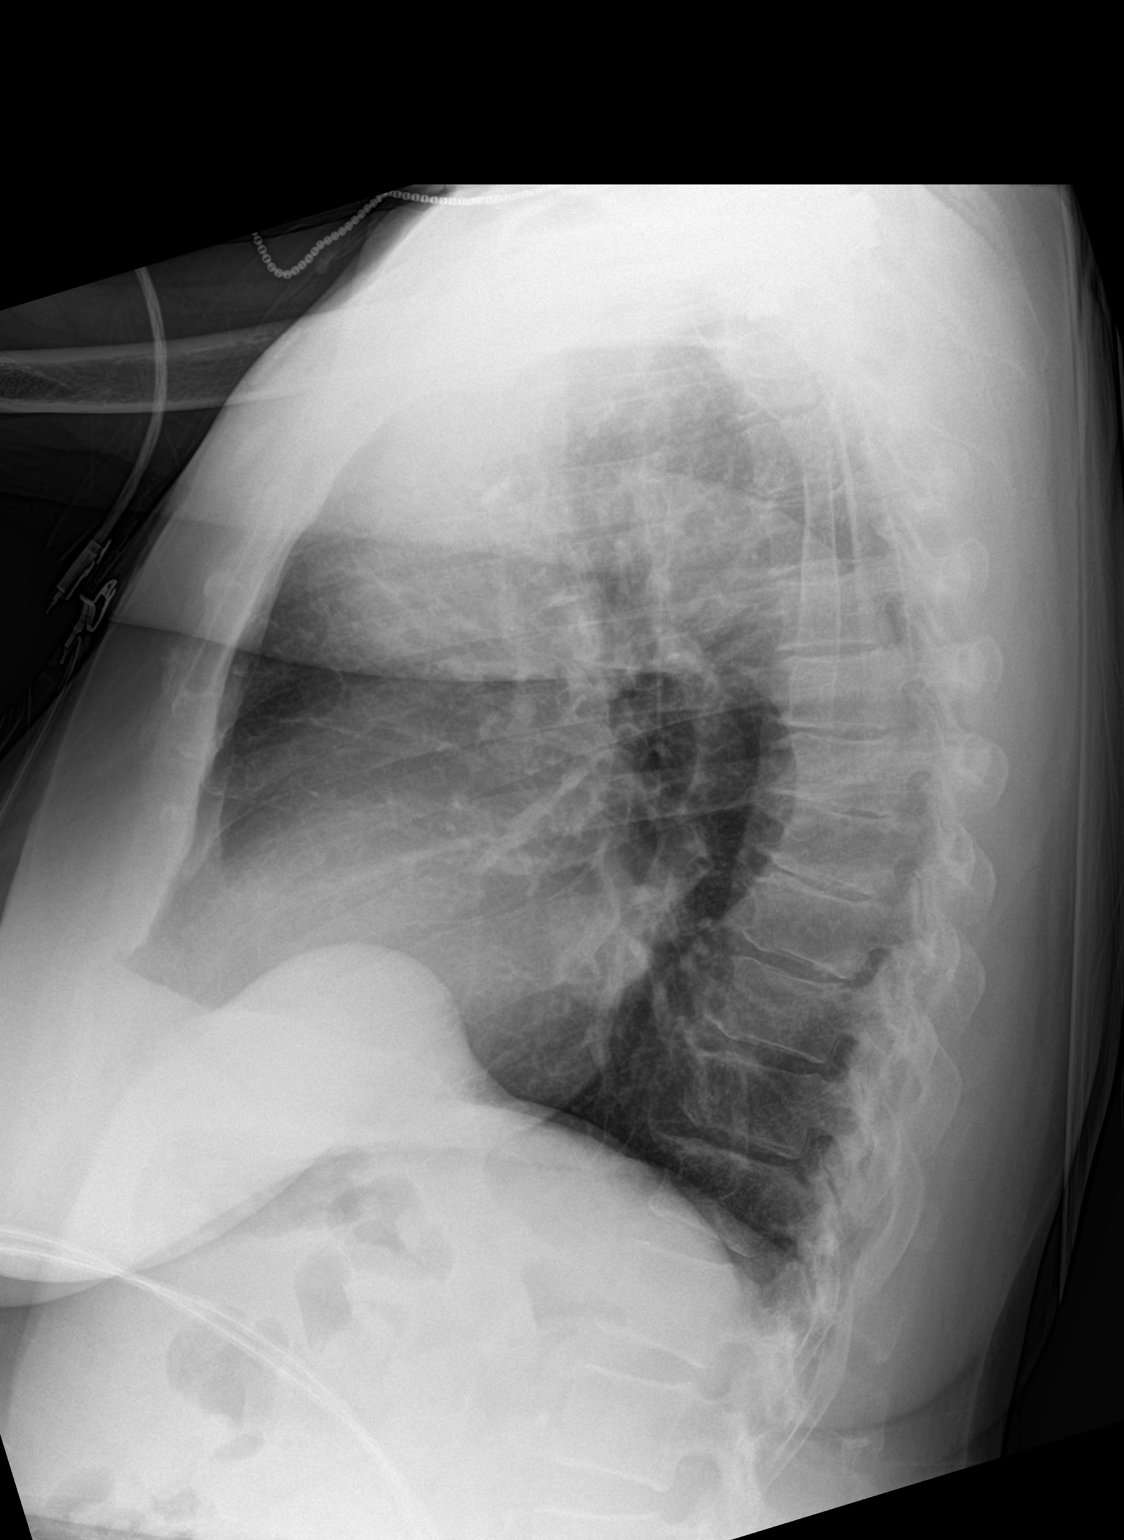
[im 2/2]
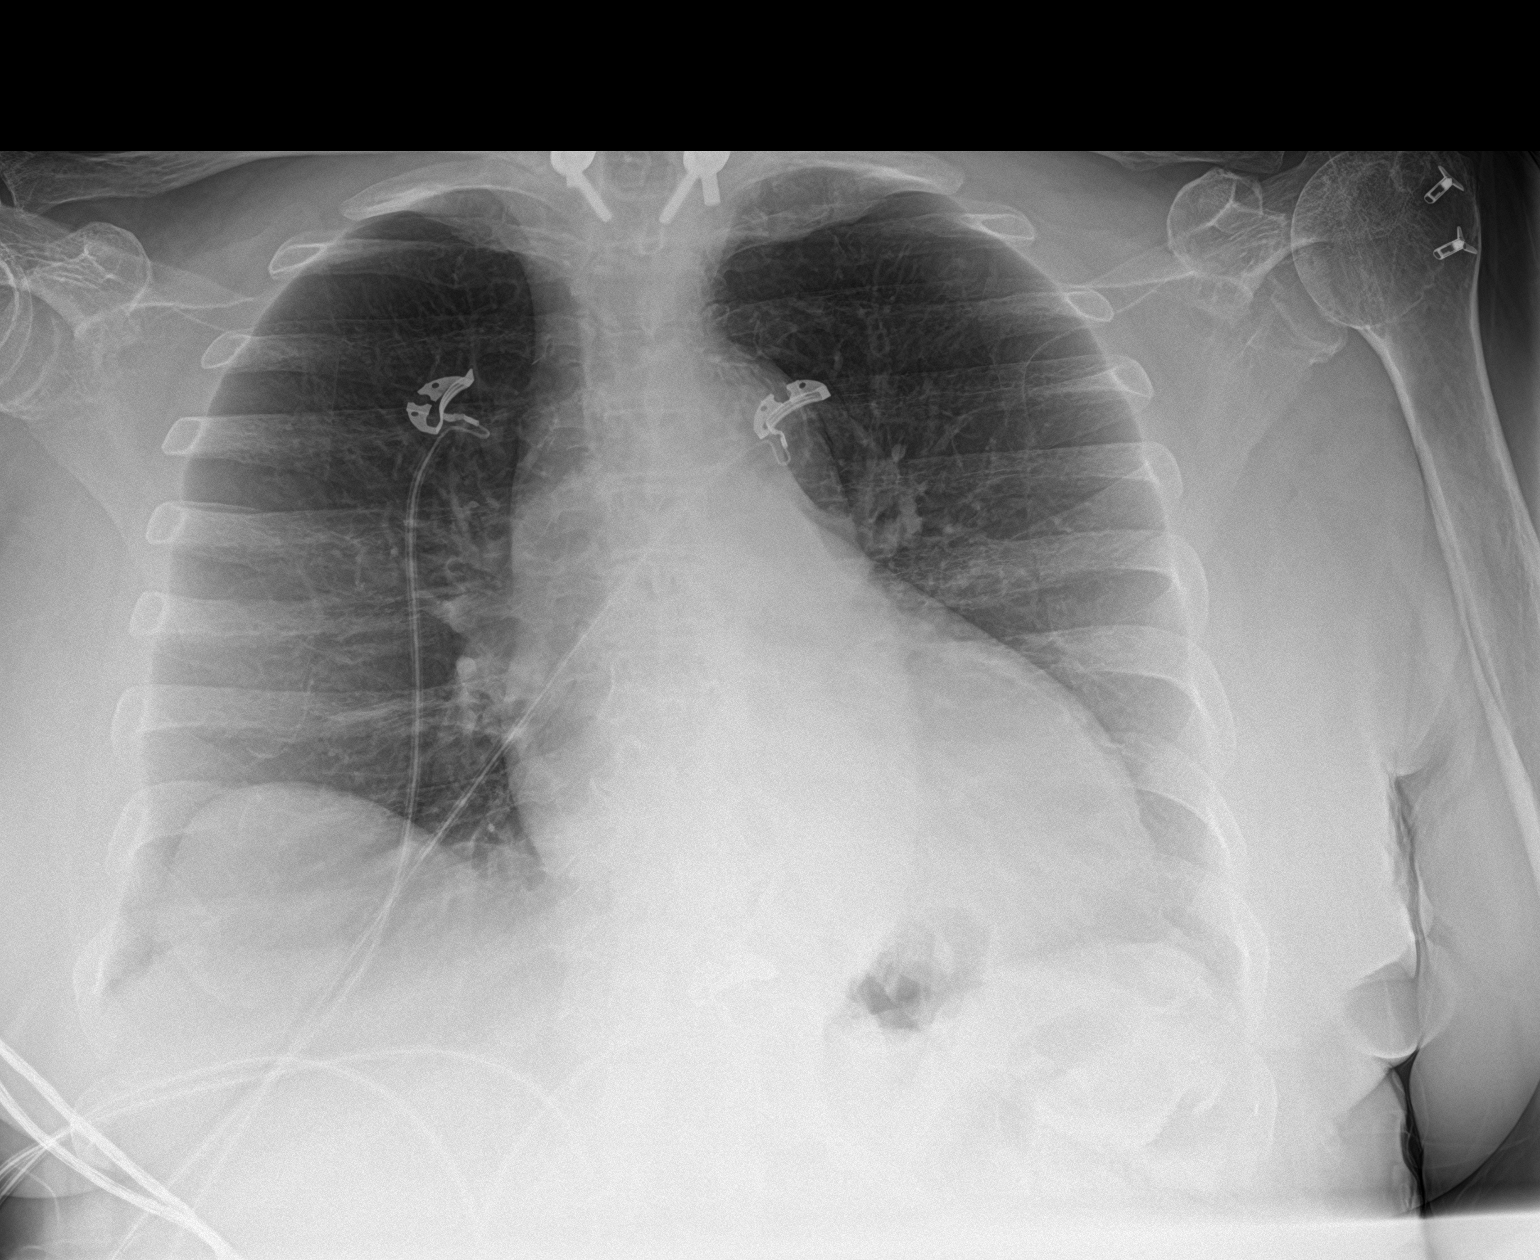

[2 of 2 positions shown; findings below may reference images not displayed]

FINDINGS: Heart size enlarged. Normal vascularity. Lungs are clear without
infiltrate or effusion

Posterior fusion cervical spine. Thoracic disc degeneration without
acute skeletal abnormality. Surgical anchors in the left proximal
humerus.
IMPRESSION: Cardiac enlargement.  No acute cardiopulmonary abnormality

## 2022-06-26 NOTE — Telephone Encounter (Signed)
Called and left a message for call back  

## 2022-06-26 NOTE — Telephone Encounter (Signed)
Called and left a message for call back for patient. Called patient husband Fritz Pickerel which is on patient DPR and left a message for call back.   Dr. Marius Ditch I am unable to get a hold of patient what do you recommend

## 2022-06-26 NOTE — Telephone Encounter (Signed)
Please send her letter with appointment date and time  RV

## 2022-06-27 NOTE — Telephone Encounter (Signed)
Made appointment and printed reminder and mailed to patient

## 2022-06-30 ENCOUNTER — Inpatient Hospital Stay: Payer: 59

## 2022-06-30 VITALS — BP 130/72 | HR 90 | Temp 97.6°F | Resp 17

## 2022-06-30 DIAGNOSIS — D509 Iron deficiency anemia, unspecified: Secondary | ICD-10-CM | POA: Diagnosis not present

## 2022-06-30 MED ORDER — SODIUM CHLORIDE 0.9 % IV SOLN
200.0000 mg | Freq: Once | INTRAVENOUS | Status: AC
  Administered 2022-06-30: 200 mg via INTRAVENOUS
  Filled 2022-06-30: qty 10

## 2022-06-30 NOTE — Patient Instructions (Signed)
Iron Sucrose Injection What is this medication? IRON SUCROSE (EYE ern SOO krose) treats low levels of iron (iron deficiency anemia) in people with kidney disease. Iron is a mineral that plays an important role in making red blood cells, which carry oxygen from your lungs to the rest of your body. This medicine may be used for other purposes; ask your health care provider or pharmacist if you have questions. COMMON BRAND NAME(S): Venofer What should I tell my care team before I take this medication? They need to know if you have any of these conditions: Anemia not caused by low iron levels Heart disease High levels of iron in the blood Kidney disease Liver disease An unusual or allergic reaction to iron, other medications, foods, dyes, or preservatives Pregnant or trying to get pregnant Breastfeeding How should I use this medication? This medication is for infusion into a vein. It is given in a hospital or clinic setting. Talk to your care team about the use of this medication in children. While this medication may be prescribed for children as young as 2 years for selected conditions, precautions do apply. Overdosage: If you think you have taken too much of this medicine contact a poison control center or emergency room at once. NOTE: This medicine is only for you. Do not share this medicine with others. What if I miss a dose? Keep appointments for follow-up doses. It is important not to miss your dose. Call your care team if you are unable to keep an appointment. What may interact with this medication? Do not take this medication with any of the following: Deferoxamine Dimercaprol Other iron products This medication may also interact with the following: Chloramphenicol Deferasirox This list may not describe all possible interactions. Give your health care provider a list of all the medicines, herbs, non-prescription drugs, or dietary supplements you use. Also tell them if you smoke,  drink alcohol, or use illegal drugs. Some items may interact with your medicine. What should I watch for while using this medication? Visit your care team regularly. Tell your care team if your symptoms do not start to get better or if they get worse. You may need blood work done while you are taking this medication. You may need to follow a special diet. Talk to your care team. Foods that contain iron include: whole grains/cereals, dried fruits, beans, or peas, leafy green vegetables, and organ meats (liver, kidney). What side effects may I notice from receiving this medication? Side effects that you should report to your care team as soon as possible: Allergic reactions--skin rash, itching, hives, swelling of the face, lips, tongue, or throat Low blood pressure--dizziness, feeling faint or lightheaded, blurry vision Shortness of breath Side effects that usually do not require medical attention (report to your care team if they continue or are bothersome): Flushing Headache Joint pain Muscle pain Nausea Pain, redness, or irritation at injection site This list may not describe all possible side effects. Call your doctor for medical advice about side effects. You may report side effects to FDA at 1-800-FDA-1088. Where should I keep my medication? This medication is given in a hospital or clinic and will not be stored at home. NOTE: This sheet is a summary. It may not cover all possible information. If you have questions about this medicine, talk to your doctor, pharmacist, or health care provider.  2023 Elsevier/Gold Standard (2020-09-02 00:00:00)

## 2022-07-04 ENCOUNTER — Inpatient Hospital Stay: Payer: 59

## 2022-07-04 VITALS — BP 153/82 | HR 82 | Resp 18

## 2022-07-04 DIAGNOSIS — D509 Iron deficiency anemia, unspecified: Secondary | ICD-10-CM

## 2022-07-04 MED ORDER — SODIUM CHLORIDE 0.9 % IV SOLN
INTRAVENOUS | Status: DC
  Filled 2022-07-04: qty 250

## 2022-07-04 MED ORDER — SODIUM CHLORIDE 0.9 % IV SOLN
200.0000 mg | Freq: Once | INTRAVENOUS | Status: AC
  Administered 2022-07-04: 200 mg via INTRAVENOUS
  Filled 2022-07-04: qty 200

## 2022-07-04 NOTE — Patient Instructions (Signed)
Iron Sucrose Injection What is this medication? IRON SUCROSE (EYE ern SOO krose) treats low levels of iron (iron deficiency anemia) in people with kidney disease. Iron is a mineral that plays an important role in making red blood cells, which carry oxygen from your lungs to the rest of your body. This medicine may be used for other purposes; ask your health care provider or pharmacist if you have questions. COMMON BRAND NAME(S): Venofer What should I tell my care team before I take this medication? They need to know if you have any of these conditions: Anemia not caused by low iron levels Heart disease High levels of iron in the blood Kidney disease Liver disease An unusual or allergic reaction to iron, other medications, foods, dyes, or preservatives Pregnant or trying to get pregnant Breastfeeding How should I use this medication? This medication is for infusion into a vein. It is given in a hospital or clinic setting. Talk to your care team about the use of this medication in children. While this medication may be prescribed for children as young as 2 years for selected conditions, precautions do apply. Overdosage: If you think you have taken too much of this medicine contact a poison control center or emergency room at once. NOTE: This medicine is only for you. Do not share this medicine with others. What if I miss a dose? Keep appointments for follow-up doses. It is important not to miss your dose. Call your care team if you are unable to keep an appointment. What may interact with this medication? Do not take this medication with any of the following: Deferoxamine Dimercaprol Other iron products This medication may also interact with the following: Chloramphenicol Deferasirox This list may not describe all possible interactions. Give your health care provider a list of all the medicines, herbs, non-prescription drugs, or dietary supplements you use. Also tell them if you smoke,  drink alcohol, or use illegal drugs. Some items may interact with your medicine. What should I watch for while using this medication? Visit your care team regularly. Tell your care team if your symptoms do not start to get better or if they get worse. You may need blood work done while you are taking this medication. You may need to follow a special diet. Talk to your care team. Foods that contain iron include: whole grains/cereals, dried fruits, beans, or peas, leafy green vegetables, and organ meats (liver, kidney). What side effects may I notice from receiving this medication? Side effects that you should report to your care team as soon as possible: Allergic reactions--skin rash, itching, hives, swelling of the face, lips, tongue, or throat Low blood pressure--dizziness, feeling faint or lightheaded, blurry vision Shortness of breath Side effects that usually do not require medical attention (report to your care team if they continue or are bothersome): Flushing Headache Joint pain Muscle pain Nausea Pain, redness, or irritation at injection site This list may not describe all possible side effects. Call your doctor for medical advice about side effects. You may report side effects to FDA at 1-800-FDA-1088. Where should I keep my medication? This medication is given in a hospital or clinic and will not be stored at home. NOTE: This sheet is a summary. It may not cover all possible information. If you have questions about this medicine, talk to your doctor, pharmacist, or health care provider.  2023 Elsevier/Gold Standard (2020-09-02 00:00:00)

## 2022-07-07 ENCOUNTER — Inpatient Hospital Stay: Payer: 59 | Attending: Oncology

## 2022-07-07 ENCOUNTER — Inpatient Hospital Stay: Payer: 59

## 2022-07-07 VITALS — BP 138/87 | HR 82 | Temp 96.9°F | Resp 16

## 2022-07-07 DIAGNOSIS — D509 Iron deficiency anemia, unspecified: Secondary | ICD-10-CM | POA: Diagnosis present

## 2022-07-07 DIAGNOSIS — Z79899 Other long term (current) drug therapy: Secondary | ICD-10-CM | POA: Diagnosis not present

## 2022-07-07 MED ORDER — SODIUM CHLORIDE 0.9 % IV SOLN
INTRAVENOUS | Status: DC
  Filled 2022-07-07: qty 250

## 2022-07-07 MED ORDER — SODIUM CHLORIDE 0.9 % IV SOLN
200.0000 mg | Freq: Once | INTRAVENOUS | Status: AC
  Administered 2022-07-07: 200 mg via INTRAVENOUS
  Filled 2022-07-07: qty 200

## 2022-07-07 NOTE — Patient Instructions (Signed)

## 2022-07-11 ENCOUNTER — Inpatient Hospital Stay: Payer: 59

## 2022-07-14 ENCOUNTER — Inpatient Hospital Stay: Payer: 59

## 2022-07-14 VITALS — BP 164/86 | HR 76 | Temp 97.5°F | Resp 18

## 2022-07-14 DIAGNOSIS — D509 Iron deficiency anemia, unspecified: Secondary | ICD-10-CM | POA: Diagnosis not present

## 2022-07-14 MED ORDER — SODIUM CHLORIDE 0.9 % IV SOLN
INTRAVENOUS | Status: DC
  Filled 2022-07-14: qty 250

## 2022-07-14 MED ORDER — SODIUM CHLORIDE 0.9 % IV SOLN
200.0000 mg | Freq: Once | INTRAVENOUS | Status: AC
  Administered 2022-07-14: 200 mg via INTRAVENOUS
  Filled 2022-07-14: qty 200

## 2022-07-14 NOTE — Patient Instructions (Signed)

## 2022-07-14 NOTE — Progress Notes (Signed)
Refused 30 minute observation post-infusion. VSS at discharge. Aware of potential complications and when to seek medical attention.

## 2022-07-21 ENCOUNTER — Inpatient Hospital Stay: Payer: 59

## 2022-07-21 ENCOUNTER — Other Ambulatory Visit: Payer: Self-pay

## 2022-07-21 VITALS — BP 132/83 | HR 88 | Temp 97.6°F

## 2022-07-21 DIAGNOSIS — D509 Iron deficiency anemia, unspecified: Secondary | ICD-10-CM | POA: Diagnosis not present

## 2022-07-21 MED ORDER — SODIUM CHLORIDE 0.9 % IV SOLN
200.0000 mg | Freq: Once | INTRAVENOUS | Status: AC
  Administered 2022-07-21: 200 mg via INTRAVENOUS
  Filled 2022-07-21: qty 200

## 2022-07-21 MED ORDER — SODIUM CHLORIDE 0.9 % IV SOLN
INTRAVENOUS | Status: DC
  Filled 2022-07-21: qty 250

## 2022-07-21 NOTE — Progress Notes (Signed)
Pt has been educated and understands. Pt refused to stay 30 mins after iron infusion. VSS.

## 2022-07-24 ENCOUNTER — Inpatient Hospital Stay: Payer: 59

## 2022-07-24 DIAGNOSIS — D509 Iron deficiency anemia, unspecified: Secondary | ICD-10-CM

## 2022-07-24 MED ORDER — OCTREOTIDE ACETATE 30 MG IM KIT
30.0000 mg | PACK | Freq: Once | INTRAMUSCULAR | Status: AC
  Administered 2022-07-24: 30 mg via INTRAMUSCULAR
  Filled 2022-07-24: qty 1

## 2022-07-26 ENCOUNTER — Encounter: Payer: Self-pay | Admitting: Gastroenterology

## 2022-07-26 ENCOUNTER — Other Ambulatory Visit: Payer: Self-pay

## 2022-07-26 ENCOUNTER — Other Ambulatory Visit
Admission: RE | Admit: 2022-07-26 | Discharge: 2022-07-26 | Disposition: A | Discharge status: Home / Self Care | Payer: 59 | Source: Ambulatory Visit | Attending: Gastroenterology | Admitting: Gastroenterology

## 2022-07-26 ENCOUNTER — Ambulatory Visit (INDEPENDENT_AMBULATORY_CARE_PROVIDER_SITE_OTHER): Payer: 59 | Admitting: Gastroenterology

## 2022-07-26 VITALS — BP 137/78 | HR 97 | Temp 98.9°F | Ht 64.0 in | Wt 179.1 lb

## 2022-07-26 DIAGNOSIS — K921 Melena: Secondary | ICD-10-CM | POA: Diagnosis present

## 2022-07-26 DIAGNOSIS — Q2730 Arteriovenous malformation, site unspecified: Secondary | ICD-10-CM

## 2022-07-26 DIAGNOSIS — D500 Iron deficiency anemia secondary to blood loss (chronic): Secondary | ICD-10-CM

## 2022-07-26 LAB — CBC WITH DIFFERENTIAL/PLATELET
Abs Immature Granulocytes: 0.01 K/uL (ref 0.00–0.07)
Basophils Absolute: 0 K/uL (ref 0.0–0.1)
Basophils Relative: 1 %
Eosinophils Absolute: 0 K/uL (ref 0.0–0.5)
Eosinophils Relative: 0 %
HCT: 34.1 % — ABNORMAL LOW (ref 36.0–46.0)
Hemoglobin: 10.4 g/dL — ABNORMAL LOW (ref 12.0–15.0)
Immature Granulocytes: 0 %
Lymphocytes Relative: 41 %
Lymphs Abs: 1.2 K/uL (ref 0.7–4.0)
MCH: 25.9 pg — ABNORMAL LOW (ref 26.0–34.0)
MCHC: 30.5 g/dL (ref 30.0–36.0)
MCV: 84.8 fL (ref 80.0–100.0)
Monocytes Absolute: 0.2 K/uL (ref 0.1–1.0)
Monocytes Relative: 8 %
Neutro Abs: 1.4 K/uL — ABNORMAL LOW (ref 1.7–7.7)
Neutrophils Relative %: 50 %
Platelets: 219 K/uL (ref 150–400)
RBC: 4.02 MIL/uL (ref 3.87–5.11)
RDW: 19.1 % — ABNORMAL HIGH (ref 11.5–15.5)
WBC: 2.9 K/uL — ABNORMAL LOW (ref 4.0–10.5)
nRBC: 0 % (ref 0.0–0.2)

## 2022-07-26 NOTE — Progress Notes (Signed)
Cephas Darby, MD [address]  [place]  [place], [place] [address]  Main: [phone]  Fax: [phone]    Gastroenterology Consultation  Referring Provider:     Letta Median, MD Primary Care Physician:  Letta Median, MD Primary Gastroenterologist:  Dr. Cephas Darby Reason for Consultation:   Iron deficiency anemia, melena        HPI:   Hailey Farrell is a 72 y.o. female referred by Dr. Rebeca Alert, Durene Cal, MD  for consultation & management of chronic iron deficiency anemia.  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 pylori.  Patient has been receiving parenteral iron therapy.  Patient was admitted to [facility] on 07/12/2021 secondary to acute on chronic iron deficiency anemia, hemoglobin dropped to 5.8 from 8.2, received blood transfusion as well as iron infusions.  She reports followed by hematology since discharge, has been receiving parenteral iron therapy.  She recently had another acute on chronic blood loss anemia on 5/8, preceding this she reported 2 to 3 days of black stools, her repeat hemoglobin dropped from 9.8 to 6.5, she received 1 unit of PRBCs at the cancer center along with IV iron.  She is currently scheduled to receive weekly iron infusions.  Patient currently reports having brown bowel movements.  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  Patient was recently admitted to [facility] on 01/25/2022 secondary to 2 weeks history of melena.  Her hemoglobin on admission was 5.1, received 4 units of PRBCs and underwent colonoscopy with small bowel enteroscopy with no source of bleeding  identified.  Her hemoglobin at the time of discharge was 8.7 on 01/28/2022.  Patient tells me that she waited so long hoping that her bleeding would stop but she became very weak.  She is currently having brown bowel movements daily.  She is accompanied by her husband.  Video capsule endoscopy from 01/27/2022 revealed a nonbleeding small AVM in small bowel.  Follow-up visit 07/26/2022 Patient reports 2 days history of black tarry stools.  She had 1 bowel movement day before yesterday, 2 yesterday and 1 this morning.  She feels slightly tired.  She is tearful about bleeding again and is frustrated with recurrent bleeding and multiple procedures.  She is receiving octreotide injections as well as iron infusions.  Patient is accompanied by her husband today.  She denies any other GI symptoms.  Denies any alcohol use, NSAID use.  She is not taking any oral iron supplements  NSAIDs: None  Antiplts/Anticoagulants/Anti thrombotics: None  GI Procedures: Reviewed under procedures tab  Past Medical History:  Diagnosis Date   Anemia    Arthritis    Breast cancer ([place]) 1990   Right   COPD (chronic obstructive pulmonary disease) (HCC)    Dyspnea    DOE   Elevated lipids    Hypertension    Lower extremity edema    Migraines    Migraines    Personal history of radiation therapy 1990   Breast   Restless leg syndrome    Rotator cuff injury     Past Surgical History:  Procedure Laterality Date  ABDOMINAL HYSTERECTOMY     BACK SURGERY  08/2017   CERVICAL FUSION   BREAST BIOPSY Right 1990   LUMPECTOMY.  took lymph nodes   BREAST LUMPECTOMY     COLONOSCOPY WITH PROPOFOL N/A 07/20/2017   Procedure: COLONOSCOPY WITH PROPOFOL;  Surgeon: Virgel Manifold, MD;  Location: [facility] ENDOSCOPY;  Service: Endoscopy;  Laterality: N/A;   COLONOSCOPY WITH PROPOFOL N/A 01/27/2022   Procedure: COLONOSCOPY WITH PROPOFOL;  Surgeon: [name], MD;  Location: [facility] ENDOSCOPY;  Service: Gastroenterology;  Laterality:  N/A;   ENTEROSCOPY N/A 08/08/2017   Procedure: ENTEROSCOPY;  Surgeon: Lin Landsman, MD;  Location: [facility] ENDOSCOPY;  Service: Gastroenterology;  Laterality: N/A;   ENTEROSCOPY N/A 05/07/2020   Procedure: ENTEROSCOPY with Adult colonoscope;  Surgeon: Virgel Manifold, MD;  Location: [facility] ENDOSCOPY;  Service: Endoscopy;  Laterality: N/A;   ENTEROSCOPY N/A 03/17/2021   Procedure: ENTEROSCOPY;  Surgeon: Virgel Manifold, MD;  Location: [facility] ENDOSCOPY;  Service: Endoscopy;  Laterality: N/A;  PUSH   ENTEROSCOPY N/A 07/13/2021   Procedure: ENTEROSCOPY;  Surgeon: Lin Landsman, MD;  Location: [facility] ENDOSCOPY;  Service: Gastroenterology;  Laterality: N/A;   ENTEROSCOPY N/A 10/26/2021   Procedure: ENTEROSCOPY;  Surgeon: Lin Landsman, MD;  Location: [facility] ENDOSCOPY;  Service: Gastroenterology;  Laterality: N/A;  push   ESOPHAGOGASTRODUODENOSCOPY Left 04/13/2017   Procedure: ESOPHAGOGASTRODUODENOSCOPY (EGD);  Surgeon: Virgel Manifold, MD;  Location: [facility] ENDOSCOPY;  Service: Endoscopy;  Laterality: Left;   ESOPHAGOGASTRODUODENOSCOPY (EGD) WITH PROPOFOL N/A 07/20/2017   Procedure: ESOPHAGOGASTRODUODENOSCOPY (EGD) WITH PROPOFOL;  Surgeon: Virgel Manifold, MD;  Location: [facility] ENDOSCOPY;  Service: Endoscopy;  Laterality: N/A;   ESOPHAGOGASTRODUODENOSCOPY (EGD) WITH PROPOFOL N/A 12/12/2017   Procedure: ESOPHAGOGASTRODUODENOSCOPY (EGD) WITH PROPOFOL;  Surgeon: Virgel Manifold, MD;  Location: [facility] ENDOSCOPY;  Service: Endoscopy;  Laterality: N/A;   ESOPHAGOGASTRODUODENOSCOPY (EGD) WITH PROPOFOL N/A 12/23/2020   Procedure: ESOPHAGOGASTRODUODENOSCOPY (EGD) WITH PROPOFOL;  Surgeon: Virgel Manifold, MD;  Location: [facility] ENDOSCOPY;  Service: Endoscopy;  Laterality: N/A;   ESOPHAGOGASTRODUODENOSCOPY (EGD) WITH PROPOFOL N/A 01/27/2022   Procedure: ESOPHAGOGASTRODUODENOSCOPY (EGD) WITH PROPOFOL;  Surgeon: [name], MD;  Location: [facility] ENDOSCOPY;  Service: Gastroenterology;   Laterality: N/A;   GIVENS CAPSULE STUDY N/A 03/10/2021   Procedure: GIVENS CAPSULE STUDY;  Surgeon: Virgel Manifold, MD;  Location: [facility] ENDOSCOPY;  Service: Endoscopy;  Laterality: N/A;   MEDIAL PARTIAL KNEE REPLACEMENT Left 1990   torn ligament. no knee replacement at that time   PARTIAL HYSTERECTOMY     SHOULDER ARTHROSCOPY WITH OPEN ROTATOR CUFF REPAIR Left 03/01/2016   Procedure: SHOULDER ARTHROSCOPY WITH OPEN ROTATOR CUFF REPAIR;  Surgeon: Thornton Park, MD;  Location: [facility] ORS;  Service: Orthopedics;  Laterality: Left;   TOTAL KNEE ARTHROPLASTY Left 02/14/2018   Procedure: TOTAL KNEE ARTHROPLASTY;  Surgeon: Thornton Park, MD;  Location: [facility] ORS;  Service: Orthopedics;  Laterality: Left;    Current Outpatient Medications:    acetaminophen (TYLENOL) 650 MG CR tablet, Take 650 mg by mouth every 8 (eight) hours as needed for pain., Disp: , Rfl:    albuterol (VENTOLIN HFA) 108 (90 Base) MCG/ACT inhaler, Inhale 2 puffs into the lungs every 6 (six) hours as needed for wheezing., Disp: , Rfl:    amitriptyline (ELAVIL) 25 MG tablet, Take 25 mg by mouth at bedtime., Disp: , Rfl:    budesonide-formoterol (SYMBICORT) 160-4.5 MCG/ACT inhaler, Inhale 2 puffs into the lungs 2 (two) times daily., Disp: , Rfl:    diphenhydramine-acetaminophen (TYLENOL PM) 25-500 MG TABS tablet, Take 1-2  tablets by mouth at bedtime as needed (pain)., Disp: , Rfl:    HYDROcodone-acetaminophen (NORCO/VICODIN) 5-325 MG tablet, Take 1 tablet by mouth every 6 (six) hours as needed., Disp: , Rfl:    lisinopril (PRINIVIL,ZESTRIL) 20 MG tablet, Take 20 mg by mouth daily., Disp: , Rfl:    potassium chloride (KLOR-CON M) 10 MEQ tablet, Take 10 mEq by mouth daily., Disp: , Rfl:    rOPINIRole (REQUIP) 2 MG tablet, Take 2-4 mg by mouth at bedtime as needed (restless leg syndrome)., Disp: , Rfl:    simvastatin (ZOCOR) 20 MG tablet, Take 20 mg by mouth daily. , Disp: , Rfl:    tiotropium (SPIRIVA) 18 MCG inhalation capsule,  Place 1 capsule (18 mcg total) into inhaler and inhale daily., Disp: 30 capsule, Rfl: 0   vitamin B-12 (CYANOCOBALAMIN) 1000 MCG tablet, Take 1,000 mcg by mouth daily., Disp: , Rfl:    furosemide (LASIX) 20 MG tablet, Take 20 mg daily by mouth. , Disp: , Rfl:  No current facility-administered medications for this visit.  Facility-Administered Medications Ordered in Other Visits:    0.9 %  sodium chloride infusion, , Intravenous, Continuous, Sindy Guadeloupe, MD, Last Rate: 20 mL/hr at 10/26/21 1058, Continued from Pre-op at 10/26/21 1058    Family History  Problem Relation Age of Onset   CAD Mother    CAD Sister    Breast cancer Neg Hx      Social History   Tobacco Use   Smoking status: Some Days    Packs/day: 0.25    Years: 50.00    Total pack years: 12.50    Types: Cigarettes    Last attempt to quit: 03/13/2016    Years since quitting: 6.3   Smokeless tobacco: Never  Vaping Use   Vaping Use: Never used  Substance Use Topics   Alcohol use: No   Drug use: No    Allergies as of 07/26/2022 - Review Complete 07/26/2022  Allergen Reaction Noted   Latex Rash 02/03/2015    Review of Systems:    All systems reviewed and negative except where noted in HPI.   Physical Exam:  BP 137/78 (BP Location: Left Arm, Patient Position: Sitting, Cuff Size: Normal)   Pulse 97   Temp 98.9 F (37.2 C) (Oral)   Ht 5' 4"$  (1.626 m)   Wt 179 lb 2 oz (81.3 kg)   BMI 30.75 kg/m  No LMP recorded. Patient has had a hysterectomy.  General:   Alert,  Well-developed, well-nourished, pleasant and cooperative in NAD Head:  Normocephalic and atraumatic. Eyes:  Sclera clear, no icterus.   Conjunctiva pink. Ears:  Normal auditory acuity. Nose:  No deformity, discharge, or lesions. Mouth:  No deformity or lesions,oropharynx pink & moist. Neck:  Supple; no masses or thyromegaly. Lungs:  Respirations even and unlabored.  Clear throughout to auscultation.   No wheezes, crackles, or rhonchi. No acute  distress. Heart:  Regular rate and rhythm; no murmurs, clicks, rubs, or gallops. Abdomen:  Normal bowel sounds. Soft, non-tender and non-distended without masses, hepatosplenomegaly or hernias noted.  No guarding or rebound tenderness.   Rectal: Not performed Msk:  Symmetrical without gross deformities. Good, equal movement & strength bilaterally. Pulses:  Normal pulses noted. Extremities:  No clubbing or edema.  No cyanosis. Neurologic:  Alert and oriented x3;  grossly normal neurologically. Skin:  Intact without significant lesions or rashes. No jaundice. Psych:  Alert and cooperative. Normal mood and affect.  Imaging Studies: No abdominal imaging  Assessment and Plan:   HADEEL MENSINGER is a 72 y.o. 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  Patient underwent repeat colonoscopy as well as small bowel enteroscopy, small bowel AVMs were not identified.  She also underwent video capsule endoscopy on 01/27/2022 which revealed a small nonbleeding AVM in the small intestine.  Has started monthly octreotide injections, she has 2 more shots to be administered.  She is also receiving parenteral iron therapy due to drop in iron levels since November.  Most recently 6 on 06/22/2022.  Patient now presents with 3 days history of melena. Check stat CBC.  Based on her hemoglobin levels, will decide timing of small bowel enteroscopy Will check intrinsic factor antibodies and parietal cell antibodies    Follow up in 4 to 6 months   Cephas Darby, MD

## 2022-07-27 ENCOUNTER — Encounter
Admission: RE | Disposition: A | Discharge status: Home / Self Care | Payer: Self-pay | Source: Ambulatory Visit | Attending: Gastroenterology

## 2022-07-27 ENCOUNTER — Other Ambulatory Visit: Payer: Self-pay

## 2022-07-27 ENCOUNTER — Ambulatory Visit: Payer: 59 | Admitting: Registered Nurse

## 2022-07-27 ENCOUNTER — Encounter: Payer: Self-pay | Admitting: Gastroenterology

## 2022-07-27 ENCOUNTER — Ambulatory Visit
Admission: RE | Admit: 2022-07-27 | Discharge: 2022-07-27 | Disposition: A | Discharge status: Home / Self Care | Payer: 59 | Source: Ambulatory Visit | Attending: Gastroenterology | Admitting: Gastroenterology

## 2022-07-27 DIAGNOSIS — Z08 Encounter for follow-up examination after completed treatment for malignant neoplasm: Secondary | ICD-10-CM | POA: Diagnosis not present

## 2022-07-27 DIAGNOSIS — D500 Iron deficiency anemia secondary to blood loss (chronic): Secondary | ICD-10-CM | POA: Diagnosis not present

## 2022-07-27 DIAGNOSIS — D509 Iron deficiency anemia, unspecified: Secondary | ICD-10-CM | POA: Diagnosis present

## 2022-07-27 DIAGNOSIS — K449 Diaphragmatic hernia without obstruction or gangrene: Secondary | ICD-10-CM | POA: Insufficient documentation

## 2022-07-27 DIAGNOSIS — J449 Chronic obstructive pulmonary disease, unspecified: Secondary | ICD-10-CM | POA: Diagnosis not present

## 2022-07-27 DIAGNOSIS — K3189 Other diseases of stomach and duodenum: Secondary | ICD-10-CM | POA: Insufficient documentation

## 2022-07-27 DIAGNOSIS — K921 Melena: Secondary | ICD-10-CM | POA: Diagnosis not present

## 2022-07-27 DIAGNOSIS — I10 Essential (primary) hypertension: Secondary | ICD-10-CM | POA: Diagnosis not present

## 2022-07-27 DIAGNOSIS — Q2730 Arteriovenous malformation, site unspecified: Secondary | ICD-10-CM

## 2022-07-27 DIAGNOSIS — Z853 Personal history of malignant neoplasm of breast: Secondary | ICD-10-CM | POA: Diagnosis not present

## 2022-07-27 HISTORY — PX: ENTEROSCOPY: SHX5533

## 2022-07-27 SURGERY — ENTEROSCOPY
Anesthesia: General

## 2022-07-27 MED ORDER — SODIUM CHLORIDE 0.9 % IV SOLN: INTRAVENOUS | Status: DC

## 2022-07-27 MED ORDER — LIDOCAINE HCL (PF) 1 % IJ SOLN
INTRAMUSCULAR | Status: AC
  Administered 2022-07-27: 1 mL
  Filled 2022-07-27: qty 2

## 2022-07-27 MED ORDER — PROPOFOL 10 MG/ML IV BOLUS
INTRAVENOUS | Status: DC | PRN
  Administered 2022-07-27: 80 mg via INTRAVENOUS

## 2022-07-27 MED ORDER — PANTOPRAZOLE SODIUM 40 MG PO TBEC: 40.0000 mg | DELAYED_RELEASE_TABLET | Freq: Every day | ORAL | 3 refills | Status: DC

## 2022-07-27 MED ORDER — PROPOFOL 500 MG/50ML IV EMUL
INTRAVENOUS | Status: DC | PRN
  Administered 2022-07-27: 150 ug/kg/min via INTRAVENOUS

## 2022-07-27 MED ORDER — LIDOCAINE HCL (CARDIAC) PF 100 MG/5ML IV SOSY
PREFILLED_SYRINGE | INTRAVENOUS | Status: DC | PRN
  Administered 2022-07-27: 100 mg via INTRAVENOUS

## 2022-07-27 NOTE — Anesthesia Preprocedure Evaluation (Signed)
Anesthesia Evaluation  Patient identified by MRN, date of birth, ID band Patient awake    Reviewed: Allergy & Precautions, NPO status , Patient's Chart, lab work & pertinent test results  Airway Mallampati: III  TM Distance: >3 FB Neck ROM: Full    Dental  (+) Missing   Pulmonary neg pulmonary ROS, COPD, Patient did not abstain from smoking., former smoker   Pulmonary exam normal  + decreased breath sounds      Cardiovascular Exercise Tolerance: Poor hypertension, Pt. on medications negative cardio ROS Normal cardiovascular exam Rhythm:Regular Rate:Normal     Neuro/Psych  Headaches negative neurological ROS  negative psych ROS   GI/Hepatic negative GI ROS, Neg liver ROS, hiatal hernia,,,  Endo/Other  negative endocrine ROS    Renal/GU negative Renal ROS  negative genitourinary   Musculoskeletal   Abdominal  (+) + obese  Peds negative pediatric ROS (+)  Hematology negative hematology ROS (+) Blood dyscrasia, anemia   Anesthesia Other Findings Past Medical History: No date: Anemia No date: Arthritis 1990: Breast cancer ([place])     Comment:  Right No date: COPD (chronic obstructive pulmonary disease) (HCC) No date: Dyspnea     Comment:  DOE No date: Elevated lipids No date: Hypertension No date: Lower extremity edema No date: Migraines No date: Migraines 1990: Personal history of radiation therapy     Comment:  Breast No date: Restless leg syndrome No date: Rotator cuff injury  Past Surgical History: No date: ABDOMINAL HYSTERECTOMY 08/2017: BACK SURGERY     Comment:  CERVICAL FUSION 1990: BREAST BIOPSY; Right     Comment:  LUMPECTOMY.  took lymph nodes No date: BREAST LUMPECTOMY 07/20/2017: COLONOSCOPY WITH PROPOFOL; N/A     Comment:  Procedure: COLONOSCOPY WITH PROPOFOL;  Surgeon:               Virgel Manifold, MD;  Location: [facility] ENDOSCOPY;                Service: Endoscopy;  Laterality:  N/A; 01/27/2022: COLONOSCOPY WITH PROPOFOL; N/A     Comment:  Procedure: COLONOSCOPY WITH PROPOFOL;  Surgeon: [name], MD;  Location: [facility] ENDOSCOPY;  Service:               Gastroenterology;  Laterality: N/A; 08/08/2017: ENTEROSCOPY; N/A     Comment:  Procedure: ENTEROSCOPY;  Surgeon: Lin Landsman,               MD;  Location: [facility] ENDOSCOPY;  Service:               Gastroenterology;  Laterality: N/A; 05/07/2020: ENTEROSCOPY; N/A     Comment:  Procedure: ENTEROSCOPY with Adult colonoscope;  Surgeon:              Virgel Manifold, MD;  Location: [facility] ENDOSCOPY;                Service: Endoscopy;  Laterality: N/A; 03/17/2021: ENTEROSCOPY; N/A     Comment:  Procedure: ENTEROSCOPY;  Surgeon: Virgel Manifold,               MD;  Location: [facility] ENDOSCOPY;  Service: Endoscopy;                Laterality: N/A;  PUSH 07/13/2021: ENTEROSCOPY; N/A     Comment:  Procedure: ENTEROSCOPY;  Surgeon: Lin Landsman,  MD;  Location: [facility] ENDOSCOPY;  Service:               Gastroenterology;  Laterality: N/A; 10/26/2021: ENTEROSCOPY; N/A     Comment:  Procedure: ENTEROSCOPY;  Surgeon: Lin Landsman,               MD;  Location: [facility] ENDOSCOPY;  Service:               Gastroenterology;  Laterality: N/A;  push 04/13/2017: ESOPHAGOGASTRODUODENOSCOPY; Left     Comment:  Procedure: ESOPHAGOGASTRODUODENOSCOPY (EGD);  Surgeon:               Virgel Manifold, MD;  Location: [facility] ENDOSCOPY;                Service: Endoscopy;  Laterality: Left; 07/20/2017: ESOPHAGOGASTRODUODENOSCOPY (EGD) WITH PROPOFOL; N/A     Comment:  Procedure: ESOPHAGOGASTRODUODENOSCOPY (EGD) WITH               PROPOFOL;  Surgeon: Virgel Manifold, MD;  Location:               [facility] ENDOSCOPY;  Service: Endoscopy;  Laterality: N/A; 12/12/2017: ESOPHAGOGASTRODUODENOSCOPY (EGD) WITH PROPOFOL; N/A     Comment:  Procedure: ESOPHAGOGASTRODUODENOSCOPY (EGD) WITH               PROPOFOL;   Surgeon: Virgel Manifold, MD;  Location:               [facility] ENDOSCOPY;  Service: Endoscopy;  Laterality: N/A; 12/23/2020: ESOPHAGOGASTRODUODENOSCOPY (EGD) WITH PROPOFOL; N/A     Comment:  Procedure: ESOPHAGOGASTRODUODENOSCOPY (EGD) WITH               PROPOFOL;  Surgeon: Virgel Manifold, MD;  Location:               [facility] ENDOSCOPY;  Service: Endoscopy;  Laterality: N/A; 01/27/2022: ESOPHAGOGASTRODUODENOSCOPY (EGD) WITH PROPOFOL; N/A     Comment:  Procedure: ESOPHAGOGASTRODUODENOSCOPY (EGD) WITH               PROPOFOL;  Surgeon: [name], MD;  Location: [facility]               ENDOSCOPY;  Service: Gastroenterology;  Laterality: N/A; 03/10/2021: GIVENS CAPSULE STUDY; N/A     Comment:  Procedure: GIVENS CAPSULE STUDY;  Surgeon: Virgel Manifold, MD;  Location: [facility] ENDOSCOPY;  Service:               Endoscopy;  Laterality: N/A; 1990: MEDIAL PARTIAL KNEE REPLACEMENT; Left     Comment:  torn ligament. no knee replacement at that time No date: PARTIAL HYSTERECTOMY 03/01/2016: SHOULDER ARTHROSCOPY WITH OPEN ROTATOR CUFF REPAIR; Left     Comment:  Procedure: SHOULDER ARTHROSCOPY WITH OPEN ROTATOR CUFF               REPAIR;  Surgeon: Thornton Park, MD;  Location: [facility]               ORS;  Service: Orthopedics;  Laterality: Left; 02/14/2018: TOTAL KNEE ARTHROPLASTY; Left     Comment:  Procedure: TOTAL KNEE ARTHROPLASTY;  Surgeon: Thornton Park, MD;  Location: [facility] ORS;  Service: Orthopedics;                Laterality: Left;  BMI    Body Mass Index: 29.35 kg/m  Reproductive/Obstetrics negative OB ROS                             Anesthesia Physical Anesthesia Plan  ASA: 3  Anesthesia Plan: General   Post-op Pain Management:    Induction: Intravenous  PONV Risk Score and Plan: Propofol infusion and TIVA  Airway Management Planned: Natural Airway and Nasal Cannula  Additional Equipment:   Intra-op Plan:    Post-operative Plan:   Informed Consent: I have reviewed the patients History and Physical, chart, labs and discussed the procedure including the risks, benefits and alternatives for the proposed anesthesia with the patient or authorized representative who has indicated his/her understanding and acceptance.     Dental Advisory Given  Plan Discussed with: CRNA and Surgeon  Anesthesia Plan Comments:        Anesthesia Quick Evaluation

## 2022-07-27 NOTE — H&P (Signed)
Hailey Lame, MD [address] [place], [place] [address] [phone] Fax : [phone]  Primary Care Physician:  Letta Median, MD Primary Gastroenterologist:  Dr. Allen Norris  Pre-Procedure History & Physical: HPI:  Hailey Farrell is a 72 y.o. female is here for an endoscopy.   Past Medical History:  Diagnosis Date   Anemia    Arthritis    Breast cancer ([place]) 1990   Right   COPD (chronic obstructive pulmonary disease) ([place])    Dyspnea    DOE   Elevated lipids    Hypertension    Lower extremity edema    Migraines    Migraines    Personal history of radiation therapy 1990   Breast   Restless leg syndrome    Rotator cuff injury     Past Surgical History:  Procedure Laterality Date   ABDOMINAL HYSTERECTOMY     BACK SURGERY  08/2017   CERVICAL FUSION   BREAST BIOPSY Right 1990   LUMPECTOMY.  took lymph nodes   BREAST LUMPECTOMY     COLONOSCOPY WITH PROPOFOL N/A 07/20/2017   Procedure: COLONOSCOPY WITH PROPOFOL;  Surgeon: Virgel Manifold, MD;  Location: [facility] ENDOSCOPY;  Service: Endoscopy;  Laterality: N/A;   COLONOSCOPY WITH PROPOFOL N/A 01/27/2022   Procedure: COLONOSCOPY WITH PROPOFOL;  Surgeon: [name], MD;  Location: [facility] ENDOSCOPY;  Service: Gastroenterology;  Laterality: N/A;   ENTEROSCOPY N/A 08/08/2017   Procedure: ENTEROSCOPY;  Surgeon: Lin Landsman, MD;  Location: [facility] ENDOSCOPY;  Service: Gastroenterology;  Laterality: N/A;   ENTEROSCOPY N/A 05/07/2020   Procedure: ENTEROSCOPY with Adult colonoscope;  Surgeon: Virgel Manifold, MD;  Location: [facility] ENDOSCOPY;  Service: Endoscopy;  Laterality: N/A;   ENTEROSCOPY N/A 03/17/2021   Procedure: ENTEROSCOPY;  Surgeon: Virgel Manifold, MD;  Location: [facility] ENDOSCOPY;  Service: Endoscopy;  Laterality: N/A;  PUSH   ENTEROSCOPY N/A 07/13/2021   Procedure: ENTEROSCOPY;  Surgeon: Lin Landsman, MD;  Location: [facility] ENDOSCOPY;  Service: Gastroenterology;  Laterality: N/A;    ENTEROSCOPY N/A 10/26/2021   Procedure: ENTEROSCOPY;  Surgeon: Lin Landsman, MD;  Location: [facility] ENDOSCOPY;  Service: Gastroenterology;  Laterality: N/A;  push   ESOPHAGOGASTRODUODENOSCOPY Left 04/13/2017   Procedure: ESOPHAGOGASTRODUODENOSCOPY (EGD);  Surgeon: Virgel Manifold, MD;  Location: [facility] ENDOSCOPY;  Service: Endoscopy;  Laterality: Left;   ESOPHAGOGASTRODUODENOSCOPY (EGD) WITH PROPOFOL N/A 07/20/2017   Procedure: ESOPHAGOGASTRODUODENOSCOPY (EGD) WITH PROPOFOL;  Surgeon: Virgel Manifold, MD;  Location: [facility] ENDOSCOPY;  Service: Endoscopy;  Laterality: N/A;   ESOPHAGOGASTRODUODENOSCOPY (EGD) WITH PROPOFOL N/A 12/12/2017   Procedure: ESOPHAGOGASTRODUODENOSCOPY (EGD) WITH PROPOFOL;  Surgeon: Virgel Manifold, MD;  Location: [facility] ENDOSCOPY;  Service: Endoscopy;  Laterality: N/A;   ESOPHAGOGASTRODUODENOSCOPY (EGD) WITH PROPOFOL N/A 12/23/2020   Procedure: ESOPHAGOGASTRODUODENOSCOPY (EGD) WITH PROPOFOL;  Surgeon: Virgel Manifold, MD;  Location: [facility] ENDOSCOPY;  Service: Endoscopy;  Laterality: N/A;   ESOPHAGOGASTRODUODENOSCOPY (EGD) WITH PROPOFOL N/A 01/27/2022   Procedure: ESOPHAGOGASTRODUODENOSCOPY (EGD) WITH PROPOFOL;  Surgeon: [name], MD;  Location: [facility] ENDOSCOPY;  Service: Gastroenterology;  Laterality: N/A;   GIVENS CAPSULE STUDY N/A 03/10/2021   Procedure: GIVENS CAPSULE STUDY;  Surgeon: Virgel Manifold, MD;  Location: [facility] ENDOSCOPY;  Service: Endoscopy;  Laterality: N/A;   MEDIAL PARTIAL KNEE REPLACEMENT Left 1990   torn ligament. no knee replacement at that time   PARTIAL HYSTERECTOMY     SHOULDER ARTHROSCOPY WITH OPEN ROTATOR CUFF REPAIR Left 03/01/2016   Procedure: SHOULDER ARTHROSCOPY WITH OPEN ROTATOR CUFF REPAIR;  Surgeon: Thornton Park,  MD;  Location: [facility] ORS;  Service: Orthopedics;  Laterality: Left;   TOTAL KNEE ARTHROPLASTY Left 02/14/2018   Procedure: TOTAL KNEE ARTHROPLASTY;  Surgeon: Thornton Park, MD;  Location: [facility] ORS;  Service:  Orthopedics;  Laterality: Left;    Prior to Admission medications   Medication Sig Start Date End Date Taking? Authorizing Provider  acetaminophen (TYLENOL) 650 MG CR tablet Take 650 mg by mouth every 8 (eight) hours as needed for pain.   Yes [provider]  albuterol (VENTOLIN HFA) 108 (90 Base) MCG/ACT inhaler Inhale 2 puffs into the lungs every 6 (six) hours as needed for wheezing.   Yes [provider]  amitriptyline (ELAVIL) 25 MG tablet Take 25 mg by mouth at bedtime. 04/13/20  Yes [provider]  budesonide-formoterol (SYMBICORT) 160-4.5 MCG/ACT inhaler Inhale 2 puffs into the lungs 2 (two) times daily.   Yes [provider]  diphenhydramine-acetaminophen (TYLENOL PM) 25-500 MG TABS tablet Take 1-2 tablets by mouth at bedtime as needed (pain).   Yes [provider]  furosemide (LASIX) 20 MG tablet Take 20 mg daily by mouth.    Yes [provider]  lisinopril (PRINIVIL,ZESTRIL) 20 MG tablet Take 20 mg by mouth daily.   Yes [provider]  potassium chloride (KLOR-CON M) 10 MEQ tablet Take 10 mEq by mouth daily. 05/11/22  Yes [provider]  rOPINIRole (REQUIP) 2 MG tablet Take 2-4 mg by mouth at bedtime as needed (restless leg syndrome).   Yes [provider]  simvastatin (ZOCOR) 20 MG tablet Take 20 mg by mouth daily.    Yes [provider]  tiotropium (SPIRIVA) 18 MCG inhalation capsule Place 1 capsule (18 mcg total) into inhaler and inhale daily. 07/14/21  Yes Sharen Hones, MD  vitamin B-12 (CYANOCOBALAMIN) 1000 MCG tablet Take 1,000 mcg by mouth daily.   Yes [provider]  HYDROcodone-acetaminophen (NORCO/VICODIN) 5-325 MG tablet Take 1 tablet by mouth every 6 (six) hours as needed.    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  Tobacco Use   Smoking status: Former    Packs/day: 0.25    Years: 50.00    Total pack years: 12.50    Types: Cigarettes    Quit date: 03/13/2016    Years since quitting: 6.3   Smokeless tobacco: Never  Vaping Use   Vaping Use: Never used  Substance and Sexual Activity   Alcohol use: No   Drug use: No   Sexual activity: Not on file  Other Topics Concern   Not on file  Social History Narrative   Not on file   Social Determinants of Health   Financial Resource Strain: Not on file  Food Insecurity: Not on file  Transportation Needs: Not on file  Physical Activity: Not on file  Stress: Not on file  Social Connections: Not on file  Intimate Partner Violence: Not on file  Review of Systems: See HPI, otherwise negative ROS  Physical Exam: BP (!) 154/81   Pulse (!) 102   Temp (!) 97.2 F (36.2 C) (Temporal)   Resp 18   Ht 5' 4"$  (1.626 m)   Wt 77.6 kg   SpO2 100%   BMI 29.35 kg/m  General:   Alert,  pleasant and cooperative in NAD Head:  Normocephalic and atraumatic. Neck:  Supple; no masses or thyromegaly. Lungs:  Clear throughout to auscultation.    Heart:  Regular rate and rhythm. Abdomen:  Soft, nontender and nondistended. Normal bowel sounds, without guarding, and without rebound.   Neurologic:  Alert and  oriented x4;  grossly normal neurologically.  Impression/Plan: Hailey Farrell is here for an endoscopy to be performed for melena  Risks, benefits, limitations, and alternatives regarding  endoscopy have been reviewed with the patient.  Questions have been answered.  All parties agreeable.   Hailey Lame, MD  07/27/2022, 11:56 AM

## 2022-07-27 NOTE — Transfer of Care (Signed)
Immediate Anesthesia Transfer of Care Note  Patient: Hailey Farrell  Procedure(s) Performed: ENTEROSCOPY  Patient Location: Endoscopy Unit  Anesthesia Type:General  Level of Consciousness: drowsy  Airway & Oxygen Therapy: Patient Spontanous Breathing and Patient connected to nasal cannula oxygen  Post-op Assessment: Report given to RN and Post -op Vital signs reviewed and stable  Post vital signs: Reviewed and stable  Last Vitals:  Vitals Value Taken Time  BP    Temp    Pulse 91 07/27/22 1226  Resp 23 07/27/22 1226  SpO2 92 % 07/27/22 1226  Vitals shown include unvalidated device data.  Last Pain:  Vitals:   07/27/22 1227  TempSrc: (P) Temporal  PainSc:          Complications: No notable events documented.

## 2022-07-27 NOTE — Op Note (Signed)
[facility] Gastroenterology Patient Name: Hailey Farrell Procedure Date: 07/27/2022 11:53 AM MRN: [id number] Account #: [id number] Date of Birth: [birth year] Admit Type: Outpatient Age: 72 Room: [facility] ENDO ROOM 3 Gender: Female Note Status: Finalized Instrument Name: Peds Colonoscope [id number] Procedure:             Small bowel enteroscopy Indications:           Iron deficiency anemia, Melena Providers:             Lucilla Lame MD, MD Referring MD:          Baxter Kail. Rebeca Alert MD, MD (Referring MD) Medicines:             Propofol per Anesthesia Complications:         No immediate complications. Procedure:             Pre-Anesthesia Assessment:                        - Prior to the procedure, a History and Physical was                         performed, and patient medications and allergies were                         reviewed. The patient's tolerance of previous                         anesthesia was also reviewed. The risks and benefits                         of the procedure and the sedation options and risks                         were discussed with the patient. All questions were                         answered, and informed consent was obtained. Prior                         Anticoagulants: The patient has taken no anticoagulant                         or antiplatelet agents. ASA Grade Assessment: II - A                         patient with mild systemic disease. After reviewing                         the risks and benefits, the patient was deemed in                         satisfactory condition to undergo the procedure.                        After obtaining informed consent, the endoscope was                         passed under direct vision. Throughout the procedure,  the patient's blood pressure, pulse, and oxygen                         saturations were monitored continuously. The                         Colonoscope was introduced  through the mouth and                         advanced to the proximal jejunum. The small bowel                         enteroscopy was accomplished without difficulty. The                         patient tolerated the procedure well. Findings:      A small hiatal hernia was present.      Multiple localized small erosions with old blood on the erosions were       found in the gastric antrum.      There was no evidence of significant pathology in the entire examined       duodenum.      There was no evidence of significant pathology in the entire examined       portion of jejunum. Impression:            - Small hiatal hernia.                        - Erosive gastropathy with old blood on the erosions.                        - Normal examined duodenum.                        - The examined portion of the jejunum was normal.                        - No specimens collected. Recommendation:        - Discharge patient to home.                        - Resume previous diet.                        - Use a proton pump inhibitor PO daily. Procedure Code(s):     --- Professional ---                        [phone], Small intestinal endoscopy, enteroscopy beyond                         second portion of duodenum, not including ileum;                         diagnostic, including collection of specimen(s) by                         brushing or washing, when performed (separate  procedure) Diagnosis Code(s):     --- Professional ---                        K92.1, Melena (includes Hematochezia) CPT copyright 2022 American [employer] Association. All rights reserved. The codes documented in this report are preliminary and upon coder review may  be revised to meet current compliance requirements. Lucilla Lame MD, MD 07/27/2022 [id number] PM This report has been signed electronically. Number of Addenda: 0 Note Initiated On: 07/27/2022 11:53 AM Estimated Blood Loss:  Estimated blood loss:  none.      [facility]

## 2022-07-27 NOTE — Anesthesia Postprocedure Evaluation (Signed)
Anesthesia Post Note  Patient: Hailey Farrell  Procedure(s) Performed: ENTEROSCOPY  Patient location during evaluation: PACU Anesthesia Type: General Level of consciousness: awake Pain management: satisfactory to patient Vital Signs Assessment: post-procedure vital signs reviewed and stable Respiratory status: nonlabored ventilation and respiratory function stable Cardiovascular status: stable Anesthetic complications: no   No notable events documented.   Last Vitals:  Vitals:   07/27/22 1237 07/27/22 1247  BP: 105/75 135/78  Pulse: 85   Resp: 20 17  Temp:    SpO2: 96%     Last Pain:  Vitals:   07/27/22 1247  TempSrc:   PainSc: 0-No pain                 VAN STAVEREN,Khayri Kargbo

## 2022-07-28 ENCOUNTER — Encounter: Payer: Self-pay | Admitting: Gastroenterology

## 2022-07-31 ENCOUNTER — Telehealth: Payer: Self-pay

## 2022-07-31 ENCOUNTER — Inpatient Hospital Stay: Payer: 59

## 2022-07-31 NOTE — Telephone Encounter (Signed)
Tried to call patient but unable to leave a voicemail. Called patient husband which is on patient DPR he is not with patient. He states he will have her give [place] a call.

## 2022-07-31 NOTE — Telephone Encounter (Signed)
Tried to call patient but unable to leave a voicemail because voicemail is full

## 2022-07-31 NOTE — Telephone Encounter (Signed)
-----   Message from Lin Landsman, MD sent at 07/30/2022  9:46 PM EST ----- I see that her small bowel enteroscopy did not reveal any AVMs.  Please check with patient if she is still having black stools, if yes, let's repeat her hemoglobin  Thanks RV

## 2022-08-01 NOTE — Telephone Encounter (Signed)
Tried to call patient but unable to leave a voicemail because voicemail is full

## 2022-08-02 NOTE — Telephone Encounter (Signed)
Patient husband states he is on the way home and will get her to call us when he gets home

## 2022-08-02 NOTE — Telephone Encounter (Signed)
Called and left a message for call back  

## 2022-08-02 NOTE — Telephone Encounter (Signed)
Unable to reach patient please advise

## 2022-08-03 NOTE — Telephone Encounter (Signed)
Called and left a message for call back. Unable to reach patient please advise

## 2022-08-03 NOTE — Telephone Encounter (Signed)
Nothing to worry

## 2022-08-07 ENCOUNTER — Inpatient Hospital Stay: Payer: 59

## 2022-08-22 ENCOUNTER — Inpatient Hospital Stay: Payer: 59 | Attending: Oncology

## 2022-08-22 ENCOUNTER — Inpatient Hospital Stay: Payer: 59

## 2022-08-22 DIAGNOSIS — Z79899 Other long term (current) drug therapy: Secondary | ICD-10-CM | POA: Diagnosis not present

## 2022-08-22 DIAGNOSIS — D509 Iron deficiency anemia, unspecified: Secondary | ICD-10-CM | POA: Insufficient documentation

## 2022-08-22 MED ORDER — OCTREOTIDE ACETATE 30 MG IM KIT
30.0000 mg | PACK | Freq: Once | INTRAMUSCULAR | Status: AC
  Administered 2022-08-22: 30 mg via INTRAMUSCULAR
  Filled 2022-08-22: qty 1

## 2022-09-22 ENCOUNTER — Inpatient Hospital Stay: Payer: 59

## 2022-09-22 ENCOUNTER — Inpatient Hospital Stay: Payer: 59 | Attending: Oncology

## 2022-09-22 DIAGNOSIS — Z79899 Other long term (current) drug therapy: Secondary | ICD-10-CM | POA: Diagnosis not present

## 2022-09-22 DIAGNOSIS — D509 Iron deficiency anemia, unspecified: Secondary | ICD-10-CM | POA: Insufficient documentation

## 2022-09-22 MED ORDER — OCTREOTIDE ACETATE 30 MG IM KIT
30.0000 mg | PACK | Freq: Once | INTRAMUSCULAR | Status: AC
  Administered 2022-09-22: 30 mg via INTRAMUSCULAR
  Filled 2022-09-22: qty 1

## 2022-10-23 ENCOUNTER — Other Ambulatory Visit: Payer: Self-pay | Admitting: *Deleted

## 2022-10-23 DIAGNOSIS — D509 Iron deficiency anemia, unspecified: Secondary | ICD-10-CM

## 2022-10-24 ENCOUNTER — Inpatient Hospital Stay (HOSPITAL_BASED_OUTPATIENT_CLINIC_OR_DEPARTMENT_OTHER): Payer: 59 | Admitting: Oncology

## 2022-10-24 ENCOUNTER — Encounter: Payer: Self-pay | Admitting: Oncology

## 2022-10-24 ENCOUNTER — Other Ambulatory Visit: Payer: 59

## 2022-10-24 ENCOUNTER — Ambulatory Visit: Payer: 59 | Admitting: Oncology

## 2022-10-24 ENCOUNTER — Encounter: Payer: Self-pay | Admitting: Pulmonary Disease

## 2022-10-24 ENCOUNTER — Inpatient Hospital Stay: Payer: 59 | Attending: Oncology

## 2022-10-24 VITALS — BP 122/70 | HR 95 | Temp 97.0°F | Resp 17 | Wt 174.0 lb

## 2022-10-24 DIAGNOSIS — D509 Iron deficiency anemia, unspecified: Secondary | ICD-10-CM | POA: Insufficient documentation

## 2022-10-24 LAB — IRON AND TIBC
Iron: 82 ug/dL (ref 28–170)
Saturation Ratios: 21 % (ref 10.4–31.8)
TIBC: 388 ug/dL (ref 250–450)
UIBC: 306 ug/dL

## 2022-10-24 LAB — CBC WITH DIFFERENTIAL/PLATELET
Abs Immature Granulocytes: 0.01 10*3/uL (ref 0.00–0.07)
Basophils Absolute: 0 10*3/uL (ref 0.0–0.1)
Basophils Relative: 1 %
Eosinophils Absolute: 0 10*3/uL (ref 0.0–0.5)
Eosinophils Relative: 1 %
HCT: 41.6 % (ref 36.0–46.0)
Hemoglobin: 13.4 g/dL (ref 12.0–15.0)
Immature Granulocytes: 0 %
Lymphocytes Relative: 32 %
Lymphs Abs: 1.4 10*3/uL (ref 0.7–4.0)
MCH: 26.9 pg (ref 26.0–34.0)
MCHC: 32.2 g/dL (ref 30.0–36.0)
MCV: 83.4 fL (ref 80.0–100.0)
Monocytes Absolute: 0.4 10*3/uL (ref 0.1–1.0)
Monocytes Relative: 9 %
Neutro Abs: 2.5 10*3/uL (ref 1.7–7.7)
Neutrophils Relative %: 57 %
Platelets: 261 10*3/uL (ref 150–400)
RBC: 4.99 MIL/uL (ref 3.87–5.11)
RDW: 14.9 % (ref 11.5–15.5)
WBC: 4.3 10*3/uL (ref 4.0–10.5)
nRBC: 0 % (ref 0.0–0.2)

## 2022-10-24 LAB — FERRITIN: Ferritin: 20 ng/mL (ref 11–307)

## 2022-10-24 MED FILL — Iron Sucrose Inj 20 MG/ML (Fe Equiv): INTRAVENOUS | Qty: 10 | Status: AC

## 2022-10-24 NOTE — Progress Notes (Unsigned)
No new concerns 

## 2022-10-24 NOTE — Progress Notes (Signed)
[facility]  [phone] [phone]  ID: Hailey Farrell OB: September 21, 1950  MR#: [id number]  DGU#:[id number]  Patient Care Team: Oswaldo Conroy, MD as PCP - General (Family Medicine) Jeralyn Ruths, MD as Consulting Physician (Oncology)  CHIEF COMPLAINT: Iron deficiency anemia  INTERVAL HISTORY: Patient returns to clinic today for repeat laboratory work, further evaluation, and consideration of additional IV Venofer.  She currently feels well and is asymptomatic.  She does not complain of any further GI bleeds.  She denies any weakness or fatigue.  She has no neurologic complaints.  She denies any recent fevers or illnesses.  She has a good appetite and denies weight loss. She denies any chest pain, shortness of breath, cough, or hemoptysis.  She denies any nausea, vomiting, constipation, or diarrhea.  She has no melena or hematochezia.  She has no urinary complaints.  Patient offers no specific complaints today.  REVIEW OF SYSTEMS:   Review of Systems  Constitutional: Negative.  Negative for fever, malaise/fatigue and weight loss.  Respiratory: Negative.  Negative for cough and shortness of breath.   Cardiovascular: Negative.  Negative for chest pain and leg swelling.  Gastrointestinal: Negative.  Negative for abdominal pain, blood in stool and melena.  Genitourinary: Negative.  Negative for hematuria.  Musculoskeletal: Negative.  Negative for back pain.  Skin: Negative.  Negative for rash.  Neurological: Negative.  Negative for dizziness, focal weakness, weakness and headaches.  Psychiatric/Behavioral: Negative.  The patient is not nervous/anxious.     As per HPI. Otherwise, a complete review of systems is negative.  PAST MEDICAL HISTORY: Past Medical History:  Diagnosis Date   Anemia    Arthritis    Breast cancer (HCC) 1990   Right   COPD (chronic obstructive pulmonary disease) (HCC)    Dyspnea    DOE   Elevated lipids     Hypertension    Lower extremity edema    Migraines    Migraines    Personal history of radiation therapy 1990   Breast   Restless leg syndrome    Rotator cuff injury     PAST SURGICAL HISTORY: Past Surgical History:  Procedure Laterality Date   ABDOMINAL HYSTERECTOMY     BACK SURGERY  08/2017   CERVICAL FUSION   BREAST BIOPSY Right 1990   LUMPECTOMY.  took lymph nodes   BREAST LUMPECTOMY     COLONOSCOPY WITH PROPOFOL N/A 07/20/2017   Procedure: COLONOSCOPY WITH PROPOFOL;  Surgeon: Pasty Spillers, MD;  Location: [facility] ENDOSCOPY;  Service: Endoscopy;  Laterality: N/A;   COLONOSCOPY WITH PROPOFOL N/A 01/27/2022   Procedure: COLONOSCOPY WITH PROPOFOL;  Surgeon: [name], MD;  Location: [facility] ENDOSCOPY;  Service: Gastroenterology;  Laterality: N/A;   ENTEROSCOPY N/A 08/08/2017   Procedure: ENTEROSCOPY;  Surgeon: Toney Reil, MD;  Location: [facility] ENDOSCOPY;  Service: Gastroenterology;  Laterality: N/A;   ENTEROSCOPY N/A 05/07/2020   Procedure: ENTEROSCOPY with Adult colonoscope;  Surgeon: Pasty Spillers, MD;  Location: [facility] ENDOSCOPY;  Service: Endoscopy;  Laterality: N/A;   ENTEROSCOPY N/A 03/17/2021   Procedure: ENTEROSCOPY;  Surgeon: Pasty Spillers, MD;  Location: [facility] ENDOSCOPY;  Service: Endoscopy;  Laterality: N/A;  PUSH   ENTEROSCOPY N/A 07/13/2021   Procedure: ENTEROSCOPY;  Surgeon: Toney Reil, MD;  Location: [facility] ENDOSCOPY;  Service: Gastroenterology;  Laterality: N/A;   ENTEROSCOPY N/A 10/26/2021   Procedure: ENTEROSCOPY;  Surgeon: Toney Reil, MD;  Location: [facility] ENDOSCOPY;  Service: Gastroenterology;  Laterality: N/A;  push  ENTEROSCOPY N/A 07/27/2022   Procedure: ENTEROSCOPY;  Surgeon: Midge Minium, MD;  Location: [facility] ENDOSCOPY;  Service: Endoscopy;  Laterality: N/A;  Push   ESOPHAGOGASTRODUODENOSCOPY Left 04/13/2017   Procedure: ESOPHAGOGASTRODUODENOSCOPY (EGD);  Surgeon: Pasty Spillers, MD;  Location: [facility] ENDOSCOPY;  Service:  Endoscopy;  Laterality: Left;   ESOPHAGOGASTRODUODENOSCOPY (EGD) WITH PROPOFOL N/A 07/20/2017   Procedure: ESOPHAGOGASTRODUODENOSCOPY (EGD) WITH PROPOFOL;  Surgeon: Pasty Spillers, MD;  Location: [facility] ENDOSCOPY;  Service: Endoscopy;  Laterality: N/A;   ESOPHAGOGASTRODUODENOSCOPY (EGD) WITH PROPOFOL N/A 12/12/2017   Procedure: ESOPHAGOGASTRODUODENOSCOPY (EGD) WITH PROPOFOL;  Surgeon: Pasty Spillers, MD;  Location: [facility] ENDOSCOPY;  Service: Endoscopy;  Laterality: N/A;   ESOPHAGOGASTRODUODENOSCOPY (EGD) WITH PROPOFOL N/A 12/23/2020   Procedure: ESOPHAGOGASTRODUODENOSCOPY (EGD) WITH PROPOFOL;  Surgeon: Pasty Spillers, MD;  Location: [facility] ENDOSCOPY;  Service: Endoscopy;  Laterality: N/A;   ESOPHAGOGASTRODUODENOSCOPY (EGD) WITH PROPOFOL N/A 01/27/2022   Procedure: ESOPHAGOGASTRODUODENOSCOPY (EGD) WITH PROPOFOL;  Surgeon: [name], MD;  Location: [facility] ENDOSCOPY;  Service: Gastroenterology;  Laterality: N/A;   GIVENS CAPSULE STUDY N/A 03/10/2021   Procedure: GIVENS CAPSULE STUDY;  Surgeon: Pasty Spillers, MD;  Location: [facility] ENDOSCOPY;  Service: Endoscopy;  Laterality: N/A;   MEDIAL PARTIAL KNEE REPLACEMENT Left 1990   torn ligament. no knee replacement at that time   PARTIAL HYSTERECTOMY     SHOULDER ARTHROSCOPY WITH OPEN ROTATOR CUFF REPAIR Left 03/01/2016   Procedure: SHOULDER ARTHROSCOPY WITH OPEN ROTATOR CUFF REPAIR;  Surgeon: Juanell Fairly, MD;  Location: [facility] ORS;  Service: Orthopedics;  Laterality: Left;   TOTAL KNEE ARTHROPLASTY Left 02/14/2018   Procedure: TOTAL KNEE ARTHROPLASTY;  Surgeon: Juanell Fairly, MD;  Location: [facility] ORS;  Service: Orthopedics;  Laterality: Left;    FAMILY HISTORY: Family History  Problem Relation Age of Onset   CAD Mother    CAD Sister    Breast cancer Neg Hx     ADVANCED DIRECTIVES (Y/N):  N  HEALTH MAINTENANCE: Social History   Tobacco Use   Smoking status: Former    Packs/day: 0.25    Years: 50.00    Additional pack  years: 0.00    Total pack years: 12.50    Types: Cigarettes    Quit date: 03/13/2016    Years since quitting: 6.6   Smokeless tobacco: Never  Vaping Use   Vaping Use: Never used  Substance Use Topics   Alcohol use: No   Drug use: No     Colonoscopy:  PAP:  Bone density:  Lipid panel:  Allergies  Allergen Reactions   Latex Rash    Welts over body    Current Outpatient Medications  Medication Sig Dispense Refill   acetaminophen (TYLENOL) 650 MG CR tablet Take 650 mg by mouth every 8 (eight) hours as needed for pain.     albuterol (VENTOLIN HFA) 108 (90 Base) MCG/ACT inhaler Inhale 2 puffs into the lungs every 6 (six) hours as needed for wheezing.     amitriptyline (ELAVIL) 25 MG tablet Take 25 mg by mouth at bedtime.     budesonide-formoterol (SYMBICORT) 160-4.5 MCG/ACT inhaler Inhale 2 puffs into the lungs 2 (two) times daily.     diphenhydramine-acetaminophen (TYLENOL PM) 25-500 MG TABS tablet Take 1-2 tablets by mouth at bedtime as needed (pain).     furosemide (LASIX) 20 MG tablet Take 20 mg daily by mouth.      lisinopril (PRINIVIL,ZESTRIL) 20 MG tablet Take 20 mg by mouth daily.     pantoprazole (PROTONIX) 40 MG tablet Take 1 tablet (40  mg total) by mouth daily. 30 tablet 3   potassium chloride (KLOR-CON M) 10 MEQ tablet Take 10 mEq by mouth daily.     rOPINIRole (REQUIP) 2 MG tablet Take 2-4 mg by mouth at bedtime as needed (restless leg syndrome).     simvastatin (ZOCOR) 20 MG tablet Take 20 mg by mouth daily.      tiotropium (SPIRIVA) 18 MCG inhalation capsule Place 1 capsule (18 mcg total) into inhaler and inhale daily. 30 capsule 0   vitamin B-12 (CYANOCOBALAMIN) 1000 MCG tablet Take 1,000 mcg by mouth daily.     HYDROcodone-acetaminophen (NORCO/VICODIN) 5-325 MG tablet Take 1 tablet by mouth every 6 (six) hours as needed. (Patient not taking: Reported on 10/24/2022)     No current facility-administered medications for this visit.   Facility-Administered Medications  Ordered in Other Visits  Medication Dose Route Frequency Provider Last Rate Last Admin   0.9 %  sodium chloride infusion   Intravenous Continuous Creig Hines, MD 20 mL/hr at 10/26/21 1058 Continued from Pre-op at 10/26/21 1058    OBJECTIVE: Vitals:   10/24/22 1348  BP: 122/70  Pulse: 95  Resp: 17  Temp: (!) 97 F (36.1 C)  SpO2: 99%     Body mass index is 29.87 kg/m.    ECOG FS:0 - Asymptomatic  General: Well-developed, well-nourished, no acute distress. Eyes: Pink conjunctiva, anicteric sclera. HEENT: Normocephalic, moist mucous membranes. Lungs: No audible wheezing or coughing. Heart: Regular rate and rhythm. Abdomen: Soft, nontender, no obvious distention. Musculoskeletal: No edema, cyanosis, or clubbing. Neuro: Alert, answering all questions appropriately. Cranial nerves grossly intact. Skin: No rashes or petechiae noted. Psych: Normal affect.  LAB RESULTS:  Lab Results  Component Value Date   NA 141 01/27/2022   K 3.7 01/27/2022   CL 112 (H) 01/27/2022   CO2 25 01/27/2022   GLUCOSE 86 01/27/2022   BUN 10 01/27/2022   CREATININE 0.72 01/27/2022   CALCIUM 8.5 (L) 01/27/2022   PROT 6.2 (L) 01/25/2022   ALBUMIN 3.8 01/25/2022   AST 15 01/25/2022   ALT 13 01/25/2022   ALKPHOS 30 (L) 01/25/2022   BILITOT 0.5 01/25/2022   GFRNONAA >60 01/27/2022   GFRAA >60 02/16/2018    Lab Results  Component Value Date   WBC 4.3 10/24/2022   NEUTROABS 2.5 10/24/2022   HGB 13.4 10/24/2022   HCT 41.6 10/24/2022   MCV 83.4 10/24/2022   PLT 261 10/24/2022   Lab Results  Component Value Date   IRON 28 06/22/2022   TIBC 399 06/22/2022   IRONPCTSAT 7 (L) 06/22/2022   Lab Results  Component Value Date   FERRITIN 6 (L) 06/22/2022     STUDIES: No results found.   ASSESSMENT: Iron deficiency anemia.  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  Argon plasma was used  for hemostasis.  Patient's hemoglobin has improved to 13.4.  She has a mildly decreased iron saturation ratio and ferritin today.  Patient does not wish to pursue any further IV Venofer at this time.  She last received treatment on July 21, 2022.  Return to clinic in 4 months with repeat laboratory work, further evaluation, and continuation of treatment if needed. History of AVMs: Appreciate GI input.  Patient has declined further IM octreotide at this time.  Patient has received a total of 7 monthly doses and last received treatment on September 22, 2022.  Follow-up as above.      Patient expressed understanding and was in agreement with this plan. She also understands that She can call clinic at any time with any questions, concerns, or complaints.    Jeralyn Ruths, MD   10/24/2022 2:23 PM

## 2022-10-25 ENCOUNTER — Inpatient Hospital Stay: Payer: 59

## 2022-10-25 ENCOUNTER — Ambulatory Visit: Payer: 59

## 2022-10-25 ENCOUNTER — Encounter: Payer: Self-pay | Admitting: Oncology

## 2022-11-27 ENCOUNTER — Encounter: Payer: Self-pay | Admitting: Student in an Organized Health Care Education/Training Program

## 2022-11-27 ENCOUNTER — Ambulatory Visit (INDEPENDENT_AMBULATORY_CARE_PROVIDER_SITE_OTHER): Payer: 59 | Admitting: Student in an Organized Health Care Education/Training Program

## 2022-11-27 VITALS — BP 138/74 | HR 100 | Temp 97.8°F | Ht 64.0 in | Wt 172.2 lb

## 2022-11-27 DIAGNOSIS — R0602 Shortness of breath: Secondary | ICD-10-CM

## 2022-11-27 DIAGNOSIS — Z720 Tobacco use: Secondary | ICD-10-CM

## 2022-11-27 NOTE — Progress Notes (Signed)
Synopsis: Referred in for history of COPD by Oswaldo Conroy, MD  Assessment & Plan:   # History of COPD  Reported history of COPD but patient does not have any respiratory symptoms.  She does have a a smoking history of only 10 pack years and does not qualify for lung cancer screening.  She is on Symbicort but does not feel any difference with or without it.  Lung exam today shows clear lungs.  I will obtain a pulmonary function test to assess for obstruction (off the Symbicort) and assess for potential for COPD.  If she does have obstruction, I will consider switching her to a LABA/LAMA regimen. Otherwise I will consider discontinuing her long acting inhaler on follow up.  - Pulmonary Function Test [facility] Only; Future   Return in about 3 months (around 02/27/2023).  I spent 45 minutes caring for this patient today, including preparing to see the patient, obtaining a medical history , reviewing a separately obtained history, performing a medically appropriate examination and/or evaluation, counseling and educating the patient/family/caregiver, ordering medications, tests, or procedures, and documenting clinical information in the electronic health record  Raechel Chute, MD [facility] 11/27/2022 9:54 AM    End of visit medications:  No orders of the defined types were placed in this encounter.    Current Outpatient Medications:    acetaminophen (TYLENOL) 650 MG CR tablet, Take 650 mg by mouth every 8 (eight) hours as needed for pain., Disp: , Rfl:    albuterol (VENTOLIN HFA) 108 (90 Base) MCG/ACT inhaler, Inhale 2 puffs into the lungs every 6 (six) hours as needed for wheezing., Disp: , Rfl:    amitriptyline (ELAVIL) 25 MG tablet, Take 25 mg by mouth at bedtime., Disp: , Rfl:    budesonide-formoterol (SYMBICORT) 160-4.5 MCG/ACT inhaler, Inhale 2 puffs into the lungs 2 (two) times daily., Disp: , Rfl:    diphenhydramine-acetaminophen (TYLENOL PM) 25-500 MG  TABS tablet, Take 1-2 tablets by mouth at bedtime as needed (pain)., Disp: , Rfl:    furosemide (LASIX) 20 MG tablet, Take 20 mg daily by mouth. , Disp: , Rfl:    HYDROcodone-acetaminophen (NORCO/VICODIN) 5-325 MG tablet, Take 1 tablet by mouth every 6 (six) hours as needed., Disp: , Rfl:    lisinopril (PRINIVIL,ZESTRIL) 20 MG tablet, Take 20 mg by mouth daily., Disp: , Rfl:    pantoprazole (PROTONIX) 40 MG tablet, Take 1 tablet (40 mg total) by mouth daily., Disp: 30 tablet, Rfl: 3   potassium chloride (KLOR-CON M) 10 MEQ tablet, Take 10 mEq by mouth daily., Disp: , Rfl:    rOPINIRole (REQUIP) 2 MG tablet, Take 2-4 mg by mouth at bedtime as needed (restless leg syndrome)., Disp: , Rfl:    simvastatin (ZOCOR) 20 MG tablet, Take 20 mg by mouth daily. , Disp: , Rfl:    tiotropium (SPIRIVA) 18 MCG inhalation capsule, Place 1 capsule (18 mcg total) into inhaler and inhale daily., Disp: 30 capsule, Rfl: 0   vitamin B-12 (CYANOCOBALAMIN) 1000 MCG tablet, Take 1,000 mcg by mouth daily., Disp: , Rfl:  No current facility-administered medications for this visit.  Facility-Administered Medications Ordered in Other Visits:    0.9 %  sodium chloride infusion, , Intravenous, Continuous, Creig Hines, MD, Last Rate: 20 mL/hr at 10/26/21 1058, Continued from Pre-op at 10/26/21 1058   Subjective:   PATIENT ID: Hailey Farrell GENDER: female DOB: 01/21/1951, MRN: [id number]  Chief Complaint  Patient presents with   pulmonary consult  COPD- Occ SOB with exertion.    HPI  Patient is a pleasant 72 year old female presenting to clinic for an evaluation.  Patient is unsure why she is in clinic today aside from being asked to.  She was seen by her primary care physician recently and a referral was sent to pulmonology.  Patient does not have any shortness of breath, denies any cough, and denies any wheezing or chest tightness.  She does not bring up any phlegm, and has not had any change to her respiratory  symptoms over the past few years.  She overall does not have any respiratory symptoms and is able to go up flights of stairs without any limitation.  She has not had any exacerbations to her breathing with no episodes of wheeze, cough, or sputum production.  Patient was told by her primary care physician many years ago that she has COPD and was prescribed Symbicort as well as an as needed albuterol.  She uses the Symbicort once daily (2 puffs) and albuterol sometimes instead of the Symbicort.  She feels unsure as to why she is on the Symbicort and has not felt any different on or off of it.  She is wondering if she could be taken off the Symbicort.  Patient has no notable occupational exposures but does report history of smoking.  She tells me that she never smoked more than 3 or 4 cigarettes a day.  She started smoking at the age of 75 and quit 3 years ago.  Overall, she has 10 pack years of smoking history. She previously lived in [place].  Ancillary information including prior medications, full medical/surgical/family/social histories, and PFTs (when available) are listed below and have been reviewed.   Review of Systems  Constitutional:  Negative for chills, fever, malaise/fatigue and weight loss.  Respiratory:  Negative for cough, hemoptysis, sputum production, shortness of breath and wheezing.   Cardiovascular:  Negative for chest pain.     Objective:   Vitals:   11/27/22 0928  BP: 138/74  Pulse: 100  Temp: 97.8 F (36.6 C)  TempSrc: Temporal  SpO2: 98%  Weight: 172 lb 3.2 oz (78.1 kg)  Height: 5\' 4"  (1.626 m)   98% on RA BMI Readings from Last 3 Encounters:  11/27/22 29.56 kg/m  10/24/22 29.87 kg/m  07/27/22 29.35 kg/m   Wt Readings from Last 3 Encounters:  11/27/22 172 lb 3.2 oz (78.1 kg)  10/24/22 174 lb (78.9 kg)  07/27/22 171 lb (77.6 kg)    Physical Exam Constitutional:      Appearance: Normal appearance. She is not ill-appearing.  Cardiovascular:     Rate and  Rhythm: Normal rate and regular rhythm.     Pulses: Normal pulses.     Heart sounds: Normal heart sounds.  Pulmonary:     Effort: Pulmonary effort is normal.     Breath sounds: Normal breath sounds.  Neurological:     General: No focal deficit present.     Mental Status: She is alert and oriented to person, place, and time. Mental status is at baseline.       Ancillary Information    Past Medical History:  Diagnosis Date   Anemia    Arthritis    Breast cancer (HCC) 1990   Right   COPD (chronic obstructive pulmonary disease) (HCC)    Dyspnea    DOE   Elevated lipids    Hypertension    Lower extremity edema    Migraines    Migraines  Personal history of radiation therapy 1990   Breast   Restless leg syndrome    Rotator cuff injury      Family History  Problem Relation Age of Onset   CAD Mother    CAD Sister    Breast cancer Neg Hx      Past Surgical History:  Procedure Laterality Date   ABDOMINAL HYSTERECTOMY     BACK SURGERY  08/2017   CERVICAL FUSION   BREAST BIOPSY Right 1990   LUMPECTOMY.  took lymph nodes   BREAST LUMPECTOMY     COLONOSCOPY WITH PROPOFOL N/A 07/20/2017   Procedure: COLONOSCOPY WITH PROPOFOL;  Surgeon: Pasty Spillers, MD;  Location: [facility] ENDOSCOPY;  Service: Endoscopy;  Laterality: N/A;   COLONOSCOPY WITH PROPOFOL N/A 01/27/2022   Procedure: COLONOSCOPY WITH PROPOFOL;  Surgeon: [name], MD;  Location: [facility] ENDOSCOPY;  Service: Gastroenterology;  Laterality: N/A;   ENTEROSCOPY N/A 08/08/2017   Procedure: ENTEROSCOPY;  Surgeon: Toney Reil, MD;  Location: [facility] ENDOSCOPY;  Service: Gastroenterology;  Laterality: N/A;   ENTEROSCOPY N/A 05/07/2020   Procedure: ENTEROSCOPY with Adult colonoscope;  Surgeon: Pasty Spillers, MD;  Location: [facility] ENDOSCOPY;  Service: Endoscopy;  Laterality: N/A;   ENTEROSCOPY N/A 03/17/2021   Procedure: ENTEROSCOPY;  Surgeon: Pasty Spillers, MD;  Location: [facility] ENDOSCOPY;  Service:  Endoscopy;  Laterality: N/A;  PUSH   ENTEROSCOPY N/A 07/13/2021   Procedure: ENTEROSCOPY;  Surgeon: Toney Reil, MD;  Location: [facility] ENDOSCOPY;  Service: Gastroenterology;  Laterality: N/A;   ENTEROSCOPY N/A 10/26/2021   Procedure: ENTEROSCOPY;  Surgeon: Toney Reil, MD;  Location: [facility] ENDOSCOPY;  Service: Gastroenterology;  Laterality: N/A;  push   ENTEROSCOPY N/A 07/27/2022   Procedure: ENTEROSCOPY;  Surgeon: Midge Minium, MD;  Location: [facility] ENDOSCOPY;  Service: Endoscopy;  Laterality: N/A;  Push   ESOPHAGOGASTRODUODENOSCOPY Left 04/13/2017   Procedure: ESOPHAGOGASTRODUODENOSCOPY (EGD);  Surgeon: Pasty Spillers, MD;  Location: [facility] ENDOSCOPY;  Service: Endoscopy;  Laterality: Left;   ESOPHAGOGASTRODUODENOSCOPY (EGD) WITH PROPOFOL N/A 07/20/2017   Procedure: ESOPHAGOGASTRODUODENOSCOPY (EGD) WITH PROPOFOL;  Surgeon: Pasty Spillers, MD;  Location: [facility] ENDOSCOPY;  Service: Endoscopy;  Laterality: N/A;   ESOPHAGOGASTRODUODENOSCOPY (EGD) WITH PROPOFOL N/A 12/12/2017   Procedure: ESOPHAGOGASTRODUODENOSCOPY (EGD) WITH PROPOFOL;  Surgeon: Pasty Spillers, MD;  Location: [facility] ENDOSCOPY;  Service: Endoscopy;  Laterality: N/A;   ESOPHAGOGASTRODUODENOSCOPY (EGD) WITH PROPOFOL N/A 12/23/2020   Procedure: ESOPHAGOGASTRODUODENOSCOPY (EGD) WITH PROPOFOL;  Surgeon: Pasty Spillers, MD;  Location: [facility] ENDOSCOPY;  Service: Endoscopy;  Laterality: N/A;   ESOPHAGOGASTRODUODENOSCOPY (EGD) WITH PROPOFOL N/A 01/27/2022   Procedure: ESOPHAGOGASTRODUODENOSCOPY (EGD) WITH PROPOFOL;  Surgeon: [name], MD;  Location: [facility] ENDOSCOPY;  Service: Gastroenterology;  Laterality: N/A;   GIVENS CAPSULE STUDY N/A 03/10/2021   Procedure: GIVENS CAPSULE STUDY;  Surgeon: Pasty Spillers, MD;  Location: [facility] ENDOSCOPY;  Service: Endoscopy;  Laterality: N/A;   MEDIAL PARTIAL KNEE REPLACEMENT Left 1990   torn ligament. no knee replacement at that time   PARTIAL HYSTERECTOMY     SHOULDER  ARTHROSCOPY WITH OPEN ROTATOR CUFF REPAIR Left 03/01/2016   Procedure: SHOULDER ARTHROSCOPY WITH OPEN ROTATOR CUFF REPAIR;  Surgeon: Juanell Fairly, MD;  Location: [facility] ORS;  Service: Orthopedics;  Laterality: Left;   TOTAL KNEE ARTHROPLASTY Left 02/14/2018   Procedure: TOTAL KNEE ARTHROPLASTY;  Surgeon: Juanell Fairly, MD;  Location: [facility] ORS;  Service: Orthopedics;  Laterality: Left;    Social History   Socioeconomic History   Marital status: Married    Spouse name: Peyton Najjar  Number of children: Not on file   Years of education: Not on file   Highest education level: Not on file  Occupational History   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  Tobacco Use   Smoking status: Former    Packs/day: 0.25    Years: 50.00    Additional pack years: 0.00    Total pack years: 12.50    Types: Cigarettes    Quit date: 03/13/2016    Years since quitting: 6.7   Smokeless tobacco: Never  Vaping Use   Vaping Use: Never used  Substance and Sexual Activity   Alcohol use: No   Drug use: No   Sexual activity: Not on file  Other Topics Concern   Not on file  Social History Narrative   Not on file   Social Determinants of Health   Financial Resource Strain: Not on file  Food Insecurity: Not on file  Transportation Needs: Not on file  Physical Activity: Not on file  Stress: Not on file  Social Connections: Not on file  Intimate Partner Violence: Not on file     Allergies  Allergen Reactions   Latex Rash    Welts over body     CBC    Component Value Date/Time   WBC 4.3 10/24/2022 1320   RBC 4.99 10/24/2022 1320   HGB 13.4 10/24/2022 1320   HGB 8.5 (L) 03/31/2021 1352   HCT 41.6 10/24/2022 1320   PLT 261 10/24/2022 1320   MCV 83.4 10/24/2022 1320   MCH 26.9 10/24/2022 1320   MCHC 32.2 10/24/2022 1320   RDW 14.9 10/24/2022 1320   LYMPHSABS 1.4 10/24/2022 1320   MONOABS 0.4 10/24/2022 1320   EOSABS 0.0 10/24/2022 1320   BASOSABS 0.0 10/24/2022 1320    Pulmonary  Functions Testing Results:     No data to display          Outpatient Medications Prior to Visit  Medication Sig Dispense Refill   acetaminophen (TYLENOL) 650 MG CR tablet Take 650 mg by mouth every 8 (eight) hours as needed for pain.     albuterol (VENTOLIN HFA) 108 (90 Base) MCG/ACT inhaler Inhale 2 puffs into the lungs every 6 (six) hours as needed for wheezing.     amitriptyline (ELAVIL) 25 MG tablet Take 25 mg by mouth at bedtime.     budesonide-formoterol (SYMBICORT) 160-4.5 MCG/ACT inhaler Inhale 2 puffs into the lungs 2 (two) times daily.     diphenhydramine-acetaminophen (TYLENOL PM) 25-500 MG TABS tablet Take 1-2 tablets by mouth at bedtime as needed (pain).     furosemide (LASIX) 20 MG tablet Take 20 mg daily by mouth.      HYDROcodone-acetaminophen (NORCO/VICODIN) 5-325 MG tablet Take 1 tablet by mouth every 6 (six) hours as needed.     lisinopril (PRINIVIL,ZESTRIL) 20 MG tablet Take 20 mg by mouth daily.     pantoprazole (PROTONIX) 40 MG tablet Take 1 tablet (40 mg total) by mouth daily. 30 tablet 3   potassium chloride (KLOR-CON M) 10 MEQ tablet Take 10 mEq by mouth daily.     rOPINIRole (REQUIP) 2 MG tablet Take 2-4 mg by mouth at bedtime as needed (restless leg syndrome).     simvastatin (ZOCOR) 20 MG tablet Take 20 mg by mouth daily.      tiotropium (SPIRIVA) 18 MCG inhalation capsule Place 1 capsule (18 mcg total) into inhaler and inhale daily. 30 capsule 0   vitamin B-12 (CYANOCOBALAMIN) 1000 MCG tablet Take 1,000  mcg by mouth daily.     Facility-Administered Medications Prior to Visit  Medication Dose Route Frequency Provider Last Rate Last Admin   0.9 %  sodium chloride infusion   Intravenous Continuous Creig Hines, MD 20 mL/hr at 10/26/21 1058 Continued from Pre-op at 10/26/21 1058

## 2023-02-23 ENCOUNTER — Other Ambulatory Visit: Payer: Self-pay | Admitting: *Deleted

## 2023-02-23 DIAGNOSIS — D509 Iron deficiency anemia, unspecified: Secondary | ICD-10-CM

## 2023-02-26 ENCOUNTER — Other Ambulatory Visit: Payer: Self-pay | Admitting: *Deleted

## 2023-02-26 ENCOUNTER — Inpatient Hospital Stay: Payer: 59

## 2023-02-26 ENCOUNTER — Encounter: Payer: Self-pay | Admitting: Oncology

## 2023-02-26 ENCOUNTER — Telehealth: Payer: Self-pay

## 2023-02-26 ENCOUNTER — Inpatient Hospital Stay: Payer: 59 | Attending: Oncology

## 2023-02-26 ENCOUNTER — Inpatient Hospital Stay (HOSPITAL_BASED_OUTPATIENT_CLINIC_OR_DEPARTMENT_OTHER): Payer: 59 | Admitting: Oncology

## 2023-02-26 VITALS — BP 114/71 | HR 91 | Temp 96.8°F | Resp 16 | Ht 64.0 in | Wt 165.8 lb

## 2023-02-26 DIAGNOSIS — D509 Iron deficiency anemia, unspecified: Secondary | ICD-10-CM

## 2023-02-26 DIAGNOSIS — Z79899 Other long term (current) drug therapy: Secondary | ICD-10-CM | POA: Insufficient documentation

## 2023-02-26 LAB — CBC WITH DIFFERENTIAL/PLATELET
Abs Immature Granulocytes: 0.01 10*3/uL (ref 0.00–0.07)
Basophils Absolute: 0 10*3/uL (ref 0.0–0.1)
Basophils Relative: 1 %
Eosinophils Absolute: 0.1 10*3/uL (ref 0.0–0.5)
Eosinophils Relative: 1 %
HCT: 19.8 % — ABNORMAL LOW (ref 36.0–46.0)
Hemoglobin: 5.8 g/dL — CL (ref 12.0–15.0)
Immature Granulocytes: 0 %
Lymphocytes Relative: 22 %
Lymphs Abs: 0.9 10*3/uL (ref 0.7–4.0)
MCH: 21 pg — ABNORMAL LOW (ref 26.0–34.0)
MCHC: 29.3 g/dL — ABNORMAL LOW (ref 30.0–36.0)
MCV: 71.7 fL — ABNORMAL LOW (ref 80.0–100.0)
Monocytes Absolute: 0.6 10*3/uL (ref 0.1–1.0)
Monocytes Relative: 14 %
Neutro Abs: 2.5 10*3/uL (ref 1.7–7.7)
Neutrophils Relative %: 62 %
Platelets: 350 10*3/uL (ref 150–400)
RBC: 2.76 MIL/uL — ABNORMAL LOW (ref 3.87–5.11)
RDW: 19.9 % — ABNORMAL HIGH (ref 11.5–15.5)
WBC: 4 10*3/uL (ref 4.0–10.5)
nRBC: 0.7 % — ABNORMAL HIGH (ref 0.0–0.2)

## 2023-02-26 LAB — PREPARE RBC (CROSSMATCH)

## 2023-02-26 LAB — IRON AND TIBC
Iron: 14 ug/dL — ABNORMAL LOW (ref 28–170)
Saturation Ratios: 3 % — ABNORMAL LOW (ref 10.4–31.8)
TIBC: 427 ug/dL (ref 250–450)
UIBC: 413 ug/dL

## 2023-02-26 LAB — SAMPLE TO BLOOD BANK

## 2023-02-26 LAB — FERRITIN: Ferritin: 4 ng/mL — ABNORMAL LOW (ref 11–307)

## 2023-02-26 NOTE — Telephone Encounter (Signed)
LVM with Dr. Verdis Prime office to return my call in regards to patient having another possible GI bleed due to hgb level at 5.8.

## 2023-02-26 NOTE — Progress Notes (Signed)
1008-Critical hgb called by Almeta Monas in cancer center lab Read back process performed with lab tech.  1010- Dr. Orlie Dakin provided with critical hgb. Read back process performed with provider.

## 2023-02-26 NOTE — Progress Notes (Signed)
[facility]  Telephone:([phone]) [phone] Fax:(336) [phone]  ID: Hailey Farrell OB: [birth year]  MR#: [id number]  EGB#:[id number]  Patient Care Team: Oswaldo Conroy, MD as PCP - General (Family Medicine) Jeralyn Ruths, MD as Consulting Physician (Oncology)  CHIEF COMPLAINT: Iron deficiency anemia  INTERVAL HISTORY: Patient returns to clinic today for repeat laboratory work and further evaluation.  She states she was in her usual state of health until approximately 1 week ago when she noticed a decrease in her appetite.  She denies any weakness or fatigue.  She denies any dark stools or GI bleeding. She has no neurologic complaints.  She denies any recent fevers or illnesses.  She has a good appetite and denies weight loss. She denies any chest pain, shortness of breath, cough, or hemoptysis.  She denies any nausea, vomiting, constipation, or diarrhea.  She has no melena or hematochezia.  She has no urinary complaints.  Patient offers no further specific complaints today.  REVIEW OF SYSTEMS:   Review of Systems  Constitutional: Negative.  Negative for fever, malaise/fatigue and weight loss.  Respiratory: Negative.  Negative for cough and shortness of breath.   Cardiovascular: Negative.  Negative for chest pain and leg swelling.  Gastrointestinal: Negative.  Negative for abdominal pain, Farrell in stool and melena.  Genitourinary: Negative.  Negative for hematuria.  Musculoskeletal: Negative.  Negative for back pain.  Skin: Negative.  Negative for rash.  Neurological: Negative.  Negative for dizziness, focal weakness, weakness and headaches.  Psychiatric/Behavioral: Negative.  The patient is not nervous/anxious.     As per HPI. Otherwise, a complete review of systems is negative.  PAST MEDICAL HISTORY: Past Medical History:  Diagnosis Date   Anemia    Arthritis    Breast cancer (HCC) 1990   Right   COPD (chronic obstructive pulmonary disease) (HCC)    Dyspnea     DOE   Elevated lipids    Hypertension    Lower extremity edema    Migraines    Migraines    Personal history of radiation therapy 1990   Breast   Restless leg syndrome    Rotator cuff injury     PAST SURGICAL HISTORY: Past Surgical History:  Procedure Laterality Date   ABDOMINAL HYSTERECTOMY     BACK SURGERY  08/2017   CERVICAL FUSION   BREAST BIOPSY Right 1990   LUMPECTOMY.  took lymph nodes   BREAST LUMPECTOMY     COLONOSCOPY WITH PROPOFOL N/A 07/20/2017   Procedure: COLONOSCOPY WITH PROPOFOL;  Surgeon: Pasty Spillers, MD;  Location: [facility] ENDOSCOPY;  Service: Endoscopy;  Laterality: N/A;   COLONOSCOPY WITH PROPOFOL N/A 01/27/2022   Procedure: COLONOSCOPY WITH PROPOFOL;  Surgeon: [name], MD;  Location: [facility] ENDOSCOPY;  Service: Gastroenterology;  Laterality: N/A;   ENTEROSCOPY N/A 08/08/2017   Procedure: ENTEROSCOPY;  Surgeon: Toney Reil, MD;  Location: [facility] ENDOSCOPY;  Service: Gastroenterology;  Laterality: N/A;   ENTEROSCOPY N/A 05/07/2020   Procedure: ENTEROSCOPY with Adult colonoscope;  Surgeon: Pasty Spillers, MD;  Location: [facility] ENDOSCOPY;  Service: Endoscopy;  Laterality: N/A;   ENTEROSCOPY N/A 03/17/2021   Procedure: ENTEROSCOPY;  Surgeon: Pasty Spillers, MD;  Location: [facility] ENDOSCOPY;  Service: Endoscopy;  Laterality: N/A;  PUSH   ENTEROSCOPY N/A 07/13/2021   Procedure: ENTEROSCOPY;  Surgeon: Toney Reil, MD;  Location: [facility] ENDOSCOPY;  Service: Gastroenterology;  Laterality: N/A;   ENTEROSCOPY N/A 10/26/2021   Procedure: ENTEROSCOPY;  Surgeon: Toney Reil, MD;  Location: [facility]  ENDOSCOPY;  Service: Gastroenterology;  Laterality: N/A;  push   ENTEROSCOPY N/A 07/27/2022   Procedure: ENTEROSCOPY;  Surgeon: Midge Minium, MD;  Location: [facility] ENDOSCOPY;  Service: Endoscopy;  Laterality: N/A;  Push   ESOPHAGOGASTRODUODENOSCOPY Left 04/13/2017   Procedure: ESOPHAGOGASTRODUODENOSCOPY (EGD);  Surgeon: Pasty Spillers, MD;   Location: [facility] ENDOSCOPY;  Service: Endoscopy;  Laterality: Left;   ESOPHAGOGASTRODUODENOSCOPY (EGD) WITH PROPOFOL N/A 07/20/2017   Procedure: ESOPHAGOGASTRODUODENOSCOPY (EGD) WITH PROPOFOL;  Surgeon: Pasty Spillers, MD;  Location: [facility] ENDOSCOPY;  Service: Endoscopy;  Laterality: N/A;   ESOPHAGOGASTRODUODENOSCOPY (EGD) WITH PROPOFOL N/A 12/12/2017   Procedure: ESOPHAGOGASTRODUODENOSCOPY (EGD) WITH PROPOFOL;  Surgeon: Pasty Spillers, MD;  Location: [facility] ENDOSCOPY;  Service: Endoscopy;  Laterality: N/A;   ESOPHAGOGASTRODUODENOSCOPY (EGD) WITH PROPOFOL N/A 12/23/2020   Procedure: ESOPHAGOGASTRODUODENOSCOPY (EGD) WITH PROPOFOL;  Surgeon: Pasty Spillers, MD;  Location: [facility] ENDOSCOPY;  Service: Endoscopy;  Laterality: N/A;   ESOPHAGOGASTRODUODENOSCOPY (EGD) WITH PROPOFOL N/A 01/27/2022   Procedure: ESOPHAGOGASTRODUODENOSCOPY (EGD) WITH PROPOFOL;  Surgeon: [name], MD;  Location: [facility] ENDOSCOPY;  Service: Gastroenterology;  Laterality: N/A;   GIVENS CAPSULE STUDY N/A 03/10/2021   Procedure: GIVENS CAPSULE STUDY;  Surgeon: Pasty Spillers, MD;  Location: [facility] ENDOSCOPY;  Service: Endoscopy;  Laterality: N/A;   MEDIAL PARTIAL KNEE REPLACEMENT Left 1990   torn ligament. no knee replacement at that time   PARTIAL HYSTERECTOMY     SHOULDER ARTHROSCOPY WITH OPEN ROTATOR CUFF REPAIR Left 03/01/2016   Procedure: SHOULDER ARTHROSCOPY WITH OPEN ROTATOR CUFF REPAIR;  Surgeon: Juanell Fairly, MD;  Location: [facility] ORS;  Service: Orthopedics;  Laterality: Left;   TOTAL KNEE ARTHROPLASTY Left 02/14/2018   Procedure: TOTAL KNEE ARTHROPLASTY;  Surgeon: Juanell Fairly, MD;  Location: [facility] ORS;  Service: Orthopedics;  Laterality: Left;    FAMILY HISTORY: Family History  Problem Relation Age of Onset   CAD Mother    CAD Sister    Breast cancer Neg Hx     ADVANCED DIRECTIVES (Y/N):  N  HEALTH MAINTENANCE: Social History   Tobacco Use   Smoking status: Former    Current packs/day:  0.00    Average packs/day: 0.3 packs/day for 50.0 years (12.5 ttl pk-yrs)    Types: Cigarettes    Start date: 03/13/1966    Quit date: 03/13/2016    Years since quitting: 6.9   Smokeless tobacco: Never  Vaping Use   Vaping status: Never Used  Substance Use Topics   Alcohol use: No   Drug use: No     Colonoscopy:  PAP:  Bone density:  Lipid panel:  Allergies  Allergen Reactions   Latex Rash    Welts over body    Current Outpatient Medications  Medication Sig Dispense Refill   acetaminophen (TYLENOL) 650 MG CR tablet Take 650 mg by mouth every 8 (eight) hours as needed for pain.     albuterol (VENTOLIN HFA) 108 (90 Base) MCG/ACT inhaler Inhale 2 puffs into the lungs every 6 (six) hours as needed for wheezing.     amitriptyline (ELAVIL) 25 MG tablet Take 25 mg by mouth at bedtime.     budesonide-formoterol (SYMBICORT) 160-4.5 MCG/ACT inhaler Inhale 2 puffs into the lungs 2 (two) times daily.     diphenhydramine-acetaminophen (TYLENOL PM) 25-500 MG TABS tablet Take 1-2 tablets by mouth at bedtime as needed (pain).     furosemide (LASIX) 20 MG tablet Take 20 mg daily by mouth.      lisinopril (PRINIVIL,ZESTRIL) 20 MG tablet Take 20 mg by mouth daily.  potassium chloride (KLOR-CON M) 10 MEQ tablet Take 10 mEq by mouth daily.     rOPINIRole (REQUIP) 2 MG tablet Take 2-4 mg by mouth at bedtime as needed (restless leg syndrome).     simvastatin (ZOCOR) 20 MG tablet Take 20 mg by mouth daily.      tiotropium (SPIRIVA) 18 MCG inhalation capsule Place 1 capsule (18 mcg total) into inhaler and inhale daily. 30 capsule 0   vitamin B-12 (CYANOCOBALAMIN) 1000 MCG tablet Take 1,000 mcg by mouth daily.     HYDROcodone-acetaminophen (NORCO/VICODIN) 5-325 MG tablet Take 1 tablet by mouth every 6 (six) hours as needed. (Patient not taking: Reported on 02/26/2023)     pantoprazole (PROTONIX) 40 MG tablet Take 1 tablet (40 mg total) by mouth daily. (Patient not taking: Reported on 02/26/2023) 30  tablet 3   No current facility-administered medications for this visit.   Facility-Administered Medications Ordered in Other Visits  Medication Dose Route Frequency Provider Last Rate Last Admin   0.9 %  sodium chloride infusion   Intravenous Continuous Creig Hines, MD 20 mL/hr at 10/26/21 1058 Continued from Pre-op at 10/26/21 1058    OBJECTIVE: Vitals:   02/26/23 1001  BP: 114/71  Pulse: 91  Resp: 16  Temp: (!) 96.8 F (36 C)  SpO2: 100%      Body mass index is 28.46 kg/m.    ECOG FS:0 - Asymptomatic  General: Well-developed, well-nourished, no acute distress. Eyes: Pink conjunctiva, anicteric sclera. HEENT: Normocephalic, moist mucous membranes. Lungs: No audible wheezing or coughing. Heart: Regular rate and rhythm. Abdomen: Soft, nontender, no obvious distention. Musculoskeletal: No edema, cyanosis, or clubbing. Neuro: Alert, answering all questions appropriately. Cranial nerves grossly intact. Skin: No rashes or petechiae noted. Psych: Normal affect.  LAB RESULTS:  Lab Results  Component Value Date   NA 141 01/27/2022   K 3.7 01/27/2022   CL 112 (H) 01/27/2022   CO2 25 01/27/2022   GLUCOSE 86 01/27/2022   BUN 10 01/27/2022   CREATININE 0.72 01/27/2022   CALCIUM 8.5 (L) 01/27/2022   PROT 6.2 (L) 01/25/2022   ALBUMIN 3.8 01/25/2022   AST 15 01/25/2022   ALT 13 01/25/2022   ALKPHOS 30 (L) 01/25/2022   BILITOT 0.5 01/25/2022   GFRNONAA >60 01/27/2022   GFRAA >60 02/16/2018    Lab Results  Component Value Date   WBC 4.0 02/26/2023   NEUTROABS 2.5 02/26/2023   HGB 5.8 (LL) 02/26/2023   HCT 19.8 (L) 02/26/2023   MCV 71.7 (L) 02/26/2023   PLT 350 02/26/2023   Lab Results  Component Value Date   IRON 14 (L) 02/26/2023   TIBC 427 02/26/2023   IRONPCTSAT 3 (L) 02/26/2023   Lab Results  Component Value Date   FERRITIN 4 (L) 02/26/2023     STUDIES: No results found.   ASSESSMENT: Iron deficiency anemia.  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  Argon plasma was used for hemostasis.  Despite patient's dramatic decrease in hemoglobin and iron stores, she is relatively asymptomatic.  Suspect she has had another GI bleed, but patient denies any change in stool color for GI bleeds.  Return to clinic on Wednesday for 2 units of packed red Farrell cells.  Patient will then return to clinic in 1 week for further evaluation and additional Farrell if needed.   History of AVMs: Appreciate GI input.  Patient  has declined further IM octreotide at this time.  Patient has received a total of 7 monthly doses and last received treatment on September 22, 2022.  A referral has been sent back to GI for further evaluation.  I spent a total of 30 minutes reviewing chart data, face-to-face evaluation with the patient, counseling and coordination of care as detailed above.   Patient expressed understanding and was in agreement with this plan. She also understands that She can call clinic at any time with any questions, concerns, or complaints.    Jeralyn Ruths, MD   02/26/2023 12:47 PM

## 2023-02-28 ENCOUNTER — Inpatient Hospital Stay: Payer: 59

## 2023-02-28 ENCOUNTER — Ambulatory Visit: Payer: 59

## 2023-02-28 DIAGNOSIS — D509 Iron deficiency anemia, unspecified: Secondary | ICD-10-CM | POA: Diagnosis not present

## 2023-02-28 MED ORDER — ACETAMINOPHEN 325 MG PO TABS
650.0000 mg | ORAL_TABLET | Freq: Once | ORAL | Status: AC
  Administered 2023-02-28: 650 mg via ORAL
  Filled 2023-02-28: qty 2

## 2023-02-28 MED ORDER — SODIUM CHLORIDE 0.9% IV SOLUTION
250.0000 mL | Freq: Once | INTRAVENOUS | Status: AC
  Administered 2023-02-28: 250 mL via INTRAVENOUS
  Filled 2023-02-28: qty 250

## 2023-02-28 MED ORDER — DIPHENHYDRAMINE HCL 50 MG/ML IJ SOLN
25.0000 mg | Freq: Once | INTRAMUSCULAR | Status: AC
  Administered 2023-02-28: 25 mg via INTRAVENOUS
  Filled 2023-02-28: qty 1

## 2023-02-28 NOTE — Patient Instructions (Signed)

## 2023-03-01 LAB — TYPE AND SCREEN
ABO/RH(D): O POS
Antibody Screen: NEGATIVE
Unit division: 0
Unit division: 0

## 2023-03-01 LAB — BPAM RBC
Blood Product Expiration Date: 202410252359
Unit Type and Rh: 5100
Unit Type and Rh: 5100
Unit Type and Rh: 5100
Unit Type and Rh: 5100

## 2023-03-05 ENCOUNTER — Encounter: Payer: Self-pay | Admitting: Student in an Organized Health Care Education/Training Program

## 2023-03-05 ENCOUNTER — Encounter: Payer: Self-pay | Admitting: Oncology

## 2023-03-05 ENCOUNTER — Ambulatory Visit (INDEPENDENT_AMBULATORY_CARE_PROVIDER_SITE_OTHER): Payer: 59 | Admitting: Student in an Organized Health Care Education/Training Program

## 2023-03-05 VITALS — BP 114/64 | HR 96 | Temp 98.0°F | Ht 64.0 in | Wt 166.0 lb

## 2023-03-05 DIAGNOSIS — Z720 Tobacco use: Secondary | ICD-10-CM | POA: Diagnosis not present

## 2023-03-05 DIAGNOSIS — R0602 Shortness of breath: Secondary | ICD-10-CM | POA: Diagnosis not present

## 2023-03-05 NOTE — Progress Notes (Signed)
Assessment & Plan:   # History of COPD   Reported history of COPD but patient does not have any respiratory symptoms.  She does have a a smoking history of only 10 pack years and does not qualify for lung cancer screening.   She is on Symbicort but does not feel any difference with or without it. Exam today again is re-assuring without wheeze or rales. She has yet to get her PFT's and I have asked that she schedule them before leaving clinic today. She continues on Symbicort which we will re-assess on follow up depending on the findings from her PFT's.   -PFT's  No follow-ups on file.  I spent 20 minutes caring for this patient today, including preparing to see the patient, obtaining a medical history , reviewing a separately obtained history, performing a medically appropriate examination and/or evaluation, counseling and educating the patient/family/caregiver, and documenting clinical information in the electronic health record  Raechel Chute, MD [facility] 03/05/2023 9:12 AM    End of visit medications:  No orders of the defined types were placed in this encounter.    Current Outpatient Medications:    acetaminophen (TYLENOL) 650 MG CR tablet, Take 650 mg by mouth every 8 (eight) hours as needed for pain., Disp: , Rfl:    albuterol (VENTOLIN HFA) 108 (90 Base) MCG/ACT inhaler, Inhale 2 puffs into the lungs every 6 (six) hours as needed for wheezing., Disp: , Rfl:    amitriptyline (ELAVIL) 25 MG tablet, Take 25 mg by mouth at bedtime., Disp: , Rfl:    budesonide-formoterol (SYMBICORT) 160-4.5 MCG/ACT inhaler, Inhale 2 puffs into the lungs 2 (two) times daily., Disp: , Rfl:    diphenhydramine-acetaminophen (TYLENOL PM) 25-500 MG TABS tablet, Take 1-2 tablets by mouth at bedtime as needed (pain)., Disp: , Rfl:    furosemide (LASIX) 20 MG tablet, Take 20 mg daily by mouth. , Disp: , Rfl:    HYDROcodone-acetaminophen (NORCO/VICODIN) 5-325 MG tablet, Take 1  tablet by mouth every 6 (six) hours as needed., Disp: , Rfl:    lisinopril (PRINIVIL,ZESTRIL) 20 MG tablet, Take 20 mg by mouth daily., Disp: , Rfl:    rOPINIRole (REQUIP) 2 MG tablet, Take 2-4 mg by mouth at bedtime as needed (restless leg syndrome)., Disp: , Rfl:    vitamin B-12 (CYANOCOBALAMIN) 1000 MCG tablet, Take 1,000 mcg by mouth daily., Disp: , Rfl:  No current facility-administered medications for this visit.  Facility-Administered Medications Ordered in Other Visits:    0.9 %  sodium chloride infusion, , Intravenous, Continuous, Creig Hines, MD, Last Rate: 20 mL/hr at 10/26/21 1058, Continued from Pre-op at 10/26/21 1058   Subjective:   PATIENT ID: Hailey Farrell GENDER: female DOB: 05-14-1951, MRN: [id number]  Chief Complaint  Patient presents with   Follow-up    No shortness of breath.     HPI  Patient is a pleasant 72 year old female presenting to clinic for an evaluation.   Patient was unsure why she presented to our clinic during her initial visit, aside from being asked to. Today, she presents for follow up but did not know that PFT's were ordered to be performed prior to the visit.  She continues on Symbicort and is minimally symptomatic. No change in symptoms since our last visit. Patient does not have any shortness of breath, denies any cough, and denies any wheezing or chest tightness.  She does not bring up any phlegm, and has not had any change to her  respiratory symptoms over the past few years.  She overall does not have any respiratory symptoms and is able to go up flights of stairs without any limitation.  She has not had any exacerbations to her breathing with no episodes of wheeze, cough, or sputum production. Patient was told by her primary care physician many years ago that she has COPD and was prescribed Symbicort as well as an as needed albuterol.    Patient has no notable occupational exposures but does report history of smoking.  She tells me that she  never smoked more than 3 or 4 cigarettes a day.  She started smoking at the age of 87 and quit 3 years ago.  Overall, she has 10 pack years of smoking history. She previously lived in [place].  Ancillary information including prior medications, full medical/surgical/family/social histories, and PFTs (when available) are listed below and have been reviewed.   Review of Systems  Constitutional:  Negative for chills, fever, malaise/fatigue and weight loss.  Respiratory:  Negative for cough, hemoptysis, sputum production, shortness of breath and wheezing.   Cardiovascular:  Negative for chest pain.     Objective:   Vitals:   03/05/23 0856  BP: 114/64  Pulse: 96  Temp: 98 F (36.7 C)  TempSrc: Temporal  SpO2: 97%  Weight: 166 lb (75.3 kg)  Height: 5\' 4"  (1.626 m)   97% on RA BMI Readings from Last 3 Encounters:  03/05/23 28.49 kg/m  02/26/23 28.46 kg/m  11/27/22 29.56 kg/m   Wt Readings from Last 3 Encounters:  03/05/23 166 lb (75.3 kg)  02/26/23 165 lb 12.8 oz (75.2 kg)  11/27/22 172 lb 3.2 oz (78.1 kg)    Physical Exam Constitutional:      Appearance: Normal appearance. She is not ill-appearing.  Cardiovascular:     Rate and Rhythm: Normal rate and regular rhythm.     Pulses: Normal pulses.     Heart sounds: Normal heart sounds.  Pulmonary:     Effort: Pulmonary effort is normal.     Breath sounds: Normal breath sounds.  Neurological:     General: No focal deficit present.     Mental Status: She is alert and oriented to person, place, and time. Mental status is at baseline.       Ancillary Information    Past Medical History:  Diagnosis Date   Anemia    Arthritis    Breast cancer (HCC) 1990   Right   COPD (chronic obstructive pulmonary disease) (HCC)    Dyspnea    DOE   Elevated lipids    Hypertension    Lower extremity edema    Migraines    Migraines    Personal history of radiation therapy 1990   Breast   Restless leg syndrome    Rotator cuff  injury      Family History  Problem Relation Age of Onset   CAD Mother    CAD Sister    Breast cancer Neg Hx      Past Surgical History:  Procedure Laterality Date   ABDOMINAL HYSTERECTOMY     BACK SURGERY  08/2017   CERVICAL FUSION   BREAST BIOPSY Right 1990   LUMPECTOMY.  took lymph nodes   BREAST LUMPECTOMY     COLONOSCOPY WITH PROPOFOL N/A 07/20/2017   Procedure: COLONOSCOPY WITH PROPOFOL;  Surgeon: Pasty Spillers, MD;  Location: [facility] ENDOSCOPY;  Service: Endoscopy;  Laterality: N/A;   COLONOSCOPY WITH PROPOFOL N/A 01/27/2022   Procedure: COLONOSCOPY WITH  PROPOFOL;  Surgeon: [name], MD;  Location: [facility] ENDOSCOPY;  Service: Gastroenterology;  Laterality: N/A;   ENTEROSCOPY N/A 08/08/2017   Procedure: ENTEROSCOPY;  Surgeon: Toney Reil, MD;  Location: [facility] ENDOSCOPY;  Service: Gastroenterology;  Laterality: N/A;   ENTEROSCOPY N/A 05/07/2020   Procedure: ENTEROSCOPY with Adult colonoscope;  Surgeon: Pasty Spillers, MD;  Location: [facility] ENDOSCOPY;  Service: Endoscopy;  Laterality: N/A;   ENTEROSCOPY N/A 03/17/2021   Procedure: ENTEROSCOPY;  Surgeon: Pasty Spillers, MD;  Location: [facility] ENDOSCOPY;  Service: Endoscopy;  Laterality: N/A;  PUSH   ENTEROSCOPY N/A 07/13/2021   Procedure: ENTEROSCOPY;  Surgeon: Toney Reil, MD;  Location: [facility] ENDOSCOPY;  Service: Gastroenterology;  Laterality: N/A;   ENTEROSCOPY N/A 10/26/2021   Procedure: ENTEROSCOPY;  Surgeon: Toney Reil, MD;  Location: [facility] ENDOSCOPY;  Service: Gastroenterology;  Laterality: N/A;  push   ENTEROSCOPY N/A 07/27/2022   Procedure: ENTEROSCOPY;  Surgeon: Midge Minium, MD;  Location: [facility] ENDOSCOPY;  Service: Endoscopy;  Laterality: N/A;  Push   ESOPHAGOGASTRODUODENOSCOPY Left 04/13/2017   Procedure: ESOPHAGOGASTRODUODENOSCOPY (EGD);  Surgeon: Pasty Spillers, MD;  Location: [facility] ENDOSCOPY;  Service: Endoscopy;  Laterality: Left;   ESOPHAGOGASTRODUODENOSCOPY (EGD) WITH PROPOFOL  N/A 07/20/2017   Procedure: ESOPHAGOGASTRODUODENOSCOPY (EGD) WITH PROPOFOL;  Surgeon: Pasty Spillers, MD;  Location: [facility] ENDOSCOPY;  Service: Endoscopy;  Laterality: N/A;   ESOPHAGOGASTRODUODENOSCOPY (EGD) WITH PROPOFOL N/A 12/12/2017   Procedure: ESOPHAGOGASTRODUODENOSCOPY (EGD) WITH PROPOFOL;  Surgeon: Pasty Spillers, MD;  Location: [facility] ENDOSCOPY;  Service: Endoscopy;  Laterality: N/A;   ESOPHAGOGASTRODUODENOSCOPY (EGD) WITH PROPOFOL N/A 12/23/2020   Procedure: ESOPHAGOGASTRODUODENOSCOPY (EGD) WITH PROPOFOL;  Surgeon: Pasty Spillers, MD;  Location: [facility] ENDOSCOPY;  Service: Endoscopy;  Laterality: N/A;   ESOPHAGOGASTRODUODENOSCOPY (EGD) WITH PROPOFOL N/A 01/27/2022   Procedure: ESOPHAGOGASTRODUODENOSCOPY (EGD) WITH PROPOFOL;  Surgeon: [name], MD;  Location: [facility] ENDOSCOPY;  Service: Gastroenterology;  Laterality: N/A;   GIVENS CAPSULE STUDY N/A 03/10/2021   Procedure: GIVENS CAPSULE STUDY;  Surgeon: Pasty Spillers, MD;  Location: [facility] ENDOSCOPY;  Service: Endoscopy;  Laterality: N/A;   MEDIAL PARTIAL KNEE REPLACEMENT Left 1990   torn ligament. no knee replacement at that time   PARTIAL HYSTERECTOMY     SHOULDER ARTHROSCOPY WITH OPEN ROTATOR CUFF REPAIR Left 03/01/2016   Procedure: SHOULDER ARTHROSCOPY WITH OPEN ROTATOR CUFF REPAIR;  Surgeon: Juanell Fairly, MD;  Location: [facility] ORS;  Service: Orthopedics;  Laterality: Left;   TOTAL KNEE ARTHROPLASTY Left 02/14/2018   Procedure: TOTAL KNEE ARTHROPLASTY;  Surgeon: Juanell Fairly, MD;  Location: [facility] ORS;  Service: Orthopedics;  Laterality: Left;    Social History   Socioeconomic History   Marital status: Married    Spouse name: Peyton Najjar   Number of children: Not on file   Years of education: Not on file   Highest education level: Not on file  Occupational History   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  Tobacco Use   Smoking status: Former    Current packs/day: 0.00    Average packs/day: 0.3  packs/day for 50.0 years (12.5 ttl pk-yrs)    Types: Cigarettes    Start date: 03/13/1966    Quit date: 03/13/2016    Years since quitting: 6.9   Smokeless tobacco: Never  Vaping Use   Vaping status: Never Used  Substance and Sexual Activity   Alcohol use: No   Drug use: No   Sexual activity: Not on file  Other Topics Concern   Not on file  Social History Narrative  Not on file   Social Determinants of Health   Financial Resource Strain: Not on file  Food Insecurity: Not on file  Transportation Needs: Not on file  Physical Activity: Not on file  Stress: Not on file  Social Connections: Not on file  Intimate Partner Violence: Not on file     Allergies  Allergen Reactions   Latex Rash    Welts over body     CBC    Component Value Date/Time   WBC 4.0 02/26/2023 0953   RBC 2.76 (L) 02/26/2023 0953   HGB 5.8 (LL) 02/26/2023 0953   HGB 8.5 (L) 03/31/2021 1352   HCT 19.8 (L) 02/26/2023 0953   PLT 350 02/26/2023 0953   MCV 71.7 (L) 02/26/2023 0953   MCH 21.0 (L) 02/26/2023 0953   MCHC 29.3 (L) 02/26/2023 0953   RDW 19.9 (H) 02/26/2023 0953   LYMPHSABS 0.9 02/26/2023 0953   MONOABS 0.6 02/26/2023 0953   EOSABS 0.1 02/26/2023 0953   BASOSABS 0.0 02/26/2023 0953    Pulmonary Functions Testing Results:     No data to display          Outpatient Medications Prior to Visit  Medication Sig Dispense Refill   acetaminophen (TYLENOL) 650 MG CR tablet Take 650 mg by mouth every 8 (eight) hours as needed for pain.     albuterol (VENTOLIN HFA) 108 (90 Base) MCG/ACT inhaler Inhale 2 puffs into the lungs every 6 (six) hours as needed for wheezing.     amitriptyline (ELAVIL) 25 MG tablet Take 25 mg by mouth at bedtime.     budesonide-formoterol (SYMBICORT) 160-4.5 MCG/ACT inhaler Inhale 2 puffs into the lungs 2 (two) times daily.     diphenhydramine-acetaminophen (TYLENOL PM) 25-500 MG TABS tablet Take 1-2 tablets by mouth at bedtime as needed (pain).     furosemide (LASIX)  20 MG tablet Take 20 mg daily by mouth.      HYDROcodone-acetaminophen (NORCO/VICODIN) 5-325 MG tablet Take 1 tablet by mouth every 6 (six) hours as needed.     lisinopril (PRINIVIL,ZESTRIL) 20 MG tablet Take 20 mg by mouth daily.     rOPINIRole (REQUIP) 2 MG tablet Take 2-4 mg by mouth at bedtime as needed (restless leg syndrome).     vitamin B-12 (CYANOCOBALAMIN) 1000 MCG tablet Take 1,000 mcg by mouth daily.     pantoprazole (PROTONIX) 40 MG tablet Take 1 tablet (40 mg total) by mouth daily. (Patient not taking: Reported on 02/26/2023) 30 tablet 3   potassium chloride (KLOR-CON M) 10 MEQ tablet Take 10 mEq by mouth daily.     simvastatin (ZOCOR) 20 MG tablet Take 20 mg by mouth daily.      tiotropium (SPIRIVA) 18 MCG inhalation capsule Place 1 capsule (18 mcg total) into inhaler and inhale daily. 30 capsule 0   Facility-Administered Medications Prior to Visit  Medication Dose Route Frequency Provider Last Rate Last Admin   0.9 %  sodium chloride infusion   Intravenous Continuous Creig Hines, MD 20 mL/hr at 10/26/21 1058 Continued from Pre-op at 10/26/21 1058

## 2023-03-07 ENCOUNTER — Inpatient Hospital Stay: Payer: 59 | Attending: Oncology

## 2023-03-07 ENCOUNTER — Inpatient Hospital Stay (HOSPITAL_BASED_OUTPATIENT_CLINIC_OR_DEPARTMENT_OTHER): Payer: 59 | Admitting: Oncology

## 2023-03-07 ENCOUNTER — Encounter: Payer: Self-pay | Admitting: Oncology

## 2023-03-07 VITALS — BP 138/91 | HR 85 | Temp 97.6°F | Resp 18 | Wt 166.0 lb

## 2023-03-07 DIAGNOSIS — D509 Iron deficiency anemia, unspecified: Secondary | ICD-10-CM | POA: Insufficient documentation

## 2023-03-07 DIAGNOSIS — Z79899 Other long term (current) drug therapy: Secondary | ICD-10-CM | POA: Insufficient documentation

## 2023-03-07 DIAGNOSIS — Z87891 Personal history of nicotine dependence: Secondary | ICD-10-CM | POA: Diagnosis not present

## 2023-03-07 LAB — CBC WITH DIFFERENTIAL (CANCER CENTER ONLY)
Abs Immature Granulocytes: 0.03 10*3/uL (ref 0.00–0.07)
Basophils Absolute: 0 10*3/uL (ref 0.0–0.1)
Basophils Relative: 1 %
Eosinophils Absolute: 0.1 10*3/uL (ref 0.0–0.5)
Eosinophils Relative: 1 %
HCT: 31.1 % — ABNORMAL LOW (ref 36.0–46.0)
Hemoglobin: 9.4 g/dL — ABNORMAL LOW (ref 12.0–15.0)
Immature Granulocytes: 1 %
Lymphocytes Relative: 26 %
Lymphs Abs: 1.2 10*3/uL (ref 0.7–4.0)
MCH: 22.8 pg — ABNORMAL LOW (ref 26.0–34.0)
MCHC: 30.2 g/dL (ref 30.0–36.0)
MCV: 75.5 fL — ABNORMAL LOW (ref 80.0–100.0)
Monocytes Absolute: 0.5 10*3/uL (ref 0.1–1.0)
Monocytes Relative: 11 %
Neutro Abs: 2.8 10*3/uL (ref 1.7–7.7)
Neutrophils Relative %: 60 %
Platelet Count: 395 10*3/uL (ref 150–400)
RBC: 4.12 MIL/uL (ref 3.87–5.11)
RDW: 22.5 % — ABNORMAL HIGH (ref 11.5–15.5)
WBC Count: 4.7 10*3/uL (ref 4.0–10.5)
nRBC: 0 % (ref 0.0–0.2)

## 2023-03-07 LAB — SAMPLE TO BLOOD BANK

## 2023-03-07 NOTE — Progress Notes (Signed)
[facility]  Telephone:(336) [phone] Fax:(336) [phone]  ID: Hailey Farrell OB: 03-21-1951  MR#: [id number]  AOZ#:[id number]  Patient Care Team: Oswaldo Conroy, MD as PCP - General (Family Medicine) Jeralyn Ruths, MD as Consulting Physician (Oncology)  CHIEF COMPLAINT: Iron deficiency anemia  INTERVAL HISTORY: Patient returns to clinic today for repeat laboratory work, further evaluation, and consideration of additional blood.  She feels significantly improved after receiving her transfusion last week.  She does not complain of any weakness or fatigue. She denies any dark stools or GI bleeding. She has no neurologic complaints.  She denies any recent fevers or illnesses.  She has a good appetite and denies weight loss. She denies any chest pain, shortness of breath, cough, or hemoptysis.  She denies any nausea, vomiting, constipation, or diarrhea.  She has no melena or hematochezia.  She has no urinary complaints.  Patient offers no specific complaints today.  REVIEW OF SYSTEMS:   Review of Systems  Constitutional: Negative.  Negative for fever, malaise/fatigue and weight loss.  Respiratory: Negative.  Negative for cough and shortness of breath.   Cardiovascular: Negative.  Negative for chest pain and leg swelling.  Gastrointestinal: Negative.  Negative for abdominal pain, blood in stool and melena.  Genitourinary: Negative.  Negative for hematuria.  Musculoskeletal: Negative.  Negative for back pain.  Skin: Negative.  Negative for rash.  Neurological: Negative.  Negative for dizziness, focal weakness, weakness and headaches.  Psychiatric/Behavioral: Negative.  The patient is not nervous/anxious.     As per HPI. Otherwise, a complete review of systems is negative.  PAST MEDICAL HISTORY: Past Medical History:  Diagnosis Date   Anemia    Arthritis    Breast cancer (HCC) 1990   Right   COPD (chronic obstructive pulmonary disease) (HCC)    Dyspnea     DOE   Elevated lipids    Hypertension    Lower extremity edema    Migraines    Migraines    Personal history of radiation therapy 1990   Breast   Restless leg syndrome    Rotator cuff injury     PAST SURGICAL HISTORY: Past Surgical History:  Procedure Laterality Date   ABDOMINAL HYSTERECTOMY     BACK SURGERY  08/2017   CERVICAL FUSION   BREAST BIOPSY Right 1990   LUMPECTOMY.  took lymph nodes   BREAST LUMPECTOMY     COLONOSCOPY WITH PROPOFOL N/A 07/20/2017   Procedure: COLONOSCOPY WITH PROPOFOL;  Surgeon: Pasty Spillers, MD;  Location: [facility] ENDOSCOPY;  Service: Endoscopy;  Laterality: N/A;   COLONOSCOPY WITH PROPOFOL N/A 01/27/2022   Procedure: COLONOSCOPY WITH PROPOFOL;  Surgeon: [name], MD;  Location: [facility] ENDOSCOPY;  Service: Gastroenterology;  Laterality: N/A;   ENTEROSCOPY N/A 08/08/2017   Procedure: ENTEROSCOPY;  Surgeon: Toney Reil, MD;  Location: [facility] ENDOSCOPY;  Service: Gastroenterology;  Laterality: N/A;   ENTEROSCOPY N/A 05/07/2020   Procedure: ENTEROSCOPY with Adult colonoscope;  Surgeon: Pasty Spillers, MD;  Location: [facility] ENDOSCOPY;  Service: Endoscopy;  Laterality: N/A;   ENTEROSCOPY N/A 03/17/2021   Procedure: ENTEROSCOPY;  Surgeon: Pasty Spillers, MD;  Location: [facility] ENDOSCOPY;  Service: Endoscopy;  Laterality: N/A;  PUSH   ENTEROSCOPY N/A 07/13/2021   Procedure: ENTEROSCOPY;  Surgeon: Toney Reil, MD;  Location: [facility] ENDOSCOPY;  Service: Gastroenterology;  Laterality: N/A;   ENTEROSCOPY N/A 10/26/2021   Procedure: ENTEROSCOPY;  Surgeon: Toney Reil, MD;  Location: [facility] ENDOSCOPY;  Service: Gastroenterology;  Laterality: N/A;  push   ENTEROSCOPY N/A 07/27/2022   Procedure: ENTEROSCOPY;  Surgeon: Midge Minium, MD;  Location: [facility] ENDOSCOPY;  Service: Endoscopy;  Laterality: N/A;  Push   ESOPHAGOGASTRODUODENOSCOPY Left 04/13/2017   Procedure: ESOPHAGOGASTRODUODENOSCOPY (EGD);  Surgeon: Pasty Spillers, MD;   Location: [facility] ENDOSCOPY;  Service: Endoscopy;  Laterality: Left;   ESOPHAGOGASTRODUODENOSCOPY (EGD) WITH PROPOFOL N/A 07/20/2017   Procedure: ESOPHAGOGASTRODUODENOSCOPY (EGD) WITH PROPOFOL;  Surgeon: Pasty Spillers, MD;  Location: [facility] ENDOSCOPY;  Service: Endoscopy;  Laterality: N/A;   ESOPHAGOGASTRODUODENOSCOPY (EGD) WITH PROPOFOL N/A 12/12/2017   Procedure: ESOPHAGOGASTRODUODENOSCOPY (EGD) WITH PROPOFOL;  Surgeon: Pasty Spillers, MD;  Location: [facility] ENDOSCOPY;  Service: Endoscopy;  Laterality: N/A;   ESOPHAGOGASTRODUODENOSCOPY (EGD) WITH PROPOFOL N/A 12/23/2020   Procedure: ESOPHAGOGASTRODUODENOSCOPY (EGD) WITH PROPOFOL;  Surgeon: Pasty Spillers, MD;  Location: [facility] ENDOSCOPY;  Service: Endoscopy;  Laterality: N/A;   ESOPHAGOGASTRODUODENOSCOPY (EGD) WITH PROPOFOL N/A 01/27/2022   Procedure: ESOPHAGOGASTRODUODENOSCOPY (EGD) WITH PROPOFOL;  Surgeon: [name], MD;  Location: [facility] ENDOSCOPY;  Service: Gastroenterology;  Laterality: N/A;   GIVENS CAPSULE STUDY N/A 03/10/2021   Procedure: GIVENS CAPSULE STUDY;  Surgeon: Pasty Spillers, MD;  Location: [facility] ENDOSCOPY;  Service: Endoscopy;  Laterality: N/A;   MEDIAL PARTIAL KNEE REPLACEMENT Left 1990   torn ligament. no knee replacement at that time   PARTIAL HYSTERECTOMY     SHOULDER ARTHROSCOPY WITH OPEN ROTATOR CUFF REPAIR Left 03/01/2016   Procedure: SHOULDER ARTHROSCOPY WITH OPEN ROTATOR CUFF REPAIR;  Surgeon: Juanell Fairly, MD;  Location: [facility] ORS;  Service: Orthopedics;  Laterality: Left;   TOTAL KNEE ARTHROPLASTY Left 02/14/2018   Procedure: TOTAL KNEE ARTHROPLASTY;  Surgeon: Juanell Fairly, MD;  Location: [facility] ORS;  Service: Orthopedics;  Laterality: Left;    FAMILY HISTORY: Family History  Problem Relation Age of Onset   CAD Mother    CAD Sister    Breast cancer Neg Hx     ADVANCED DIRECTIVES (Y/N):  N  HEALTH MAINTENANCE: Social History   Tobacco Use   Smoking status: Former    Current packs/day:  0.00    Average packs/day: 0.3 packs/day for 50.0 years (12.5 ttl pk-yrs)    Types: Cigarettes    Start date: 03/13/1966    Quit date: 03/13/2016    Years since quitting: 6.9   Smokeless tobacco: Never  Vaping Use   Vaping status: Never Used  Substance Use Topics   Alcohol use: No   Drug use: No     Colonoscopy:  PAP:  Bone density:  Lipid panel:  Allergies  Allergen Reactions   Latex Rash    Welts over body    Current Outpatient Medications  Medication Sig Dispense Refill   acetaminophen (TYLENOL) 650 MG CR tablet Take 650 mg by mouth every 8 (eight) hours as needed for pain.     albuterol (VENTOLIN HFA) 108 (90 Base) MCG/ACT inhaler Inhale 2 puffs into the lungs every 6 (six) hours as needed for wheezing.     amitriptyline (ELAVIL) 25 MG tablet Take 25 mg by mouth at bedtime.     budesonide-formoterol (SYMBICORT) 160-4.5 MCG/ACT inhaler Inhale 2 puffs into the lungs 2 (two) times daily.     diphenhydramine-acetaminophen (TYLENOL PM) 25-500 MG TABS tablet Take 1-2 tablets by mouth at bedtime as needed (pain).     furosemide (LASIX) 20 MG tablet Take 20 mg daily by mouth.      HYDROcodone-acetaminophen (NORCO/VICODIN) 5-325 MG tablet Take 1 tablet by mouth every 6 (six) hours as needed.  lisinopril (PRINIVIL,ZESTRIL) 20 MG tablet Take 20 mg by mouth daily.     rOPINIRole (REQUIP) 2 MG tablet Take 2-4 mg by mouth at bedtime as needed (restless leg syndrome).     vitamin B-12 (CYANOCOBALAMIN) 1000 MCG tablet Take 1,000 mcg by mouth daily.     No current facility-administered medications for this visit.   Facility-Administered Medications Ordered in Other Visits  Medication Dose Route Frequency Provider Last Rate Last Admin   0.9 %  sodium chloride infusion   Intravenous Continuous Creig Hines, MD 20 mL/hr at 10/26/21 1058 Continued from Pre-op at 10/26/21 1058    OBJECTIVE: Vitals:   03/07/23 0915  BP: (!) 138/91  Pulse: 85  Resp: 18  Temp: 97.6 F (36.4 C)   SpO2: 100%      Body mass index is 28.49 kg/m.    ECOG FS:0 - Asymptomatic  General: Well-developed, well-nourished, no acute distress. Eyes: Pink conjunctiva, anicteric sclera. HEENT: Normocephalic, moist mucous membranes. Lungs: No audible wheezing or coughing. Heart: Regular rate and rhythm. Abdomen: Soft, nontender, no obvious distention. Musculoskeletal: No edema, cyanosis, or clubbing. Neuro: Alert, answering all questions appropriately. Cranial nerves grossly intact. Skin: No rashes or petechiae noted. Psych: Normal affect.  LAB RESULTS:  Lab Results  Component Value Date   NA 141 01/27/2022   K 3.7 01/27/2022   CL 112 (H) 01/27/2022   CO2 25 01/27/2022   GLUCOSE 86 01/27/2022   BUN 10 01/27/2022   CREATININE 0.72 01/27/2022   CALCIUM 8.5 (L) 01/27/2022   PROT 6.2 (L) 01/25/2022   ALBUMIN 3.8 01/25/2022   AST 15 01/25/2022   ALT 13 01/25/2022   ALKPHOS 30 (L) 01/25/2022   BILITOT 0.5 01/25/2022   GFRNONAA >60 01/27/2022   GFRAA >60 02/16/2018    Lab Results  Component Value Date   WBC 4.7 03/07/2023   NEUTROABS 2.8 03/07/2023   HGB 9.4 (L) 03/07/2023   HCT 31.1 (L) 03/07/2023   MCV 75.5 (L) 03/07/2023   PLT 395 03/07/2023   Lab Results  Component Value Date   IRON 14 (L) 02/26/2023   TIBC 427 02/26/2023   IRONPCTSAT 3 (L) 02/26/2023   Lab Results  Component Value Date   FERRITIN 4 (L) 02/26/2023     STUDIES: No results found.   ASSESSMENT: Iron deficiency anemia.  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  Argon plasma was used for hemostasis.  Patient's hemoglobin has improved to 9.4 with transfusion.  She continues to feel asymptomatic. Suspect she has had another GI bleed, but patient denies any change in stool color for GI bleeds.  No transfusion is needed at this time.  Return to clinic in 1 month with repeat laboratory work, further  evaluation, and consideration of blood or IV iron. History of AVMs: Appreciate GI input.  Patient has declined further IM octreotide at this time.  Patient has received a total of 7 monthly doses and last received treatment on September 22, 2022.  A referral has been sent back to GI for further evaluation.  I spent a total of 20 minutes reviewing chart data, face-to-face evaluation with the patient, counseling and coordination of care as detailed above.    Patient expressed understanding and was in agreement with this plan. She also understands that She can call clinic at any time with any questions, concerns, or complaints.    Tollie Pizza  Orlie Dakin, MD   03/07/2023 10:49 AM

## 2023-03-07 NOTE — Progress Notes (Signed)
Patient denies any concerns today.  

## 2023-03-08 ENCOUNTER — Inpatient Hospital Stay: Payer: 59

## 2023-03-29 ENCOUNTER — Ambulatory Visit: Payer: 59

## 2023-04-02 ENCOUNTER — Ambulatory Visit: Payer: 59 | Admitting: Student in an Organized Health Care Education/Training Program

## 2023-04-16 ENCOUNTER — Inpatient Hospital Stay: Payer: 59 | Attending: Oncology

## 2023-04-16 ENCOUNTER — Encounter: Payer: Self-pay | Admitting: Student in an Organized Health Care Education/Training Program

## 2023-04-16 DIAGNOSIS — Z90710 Acquired absence of both cervix and uterus: Secondary | ICD-10-CM | POA: Insufficient documentation

## 2023-04-16 DIAGNOSIS — J449 Chronic obstructive pulmonary disease, unspecified: Secondary | ICD-10-CM | POA: Insufficient documentation

## 2023-04-16 DIAGNOSIS — Z923 Personal history of irradiation: Secondary | ICD-10-CM | POA: Insufficient documentation

## 2023-04-16 DIAGNOSIS — Z8249 Family history of ischemic heart disease and other diseases of the circulatory system: Secondary | ICD-10-CM | POA: Insufficient documentation

## 2023-04-16 DIAGNOSIS — Z79899 Other long term (current) drug therapy: Secondary | ICD-10-CM | POA: Insufficient documentation

## 2023-04-16 DIAGNOSIS — D509 Iron deficiency anemia, unspecified: Secondary | ICD-10-CM | POA: Insufficient documentation

## 2023-04-16 DIAGNOSIS — I10 Essential (primary) hypertension: Secondary | ICD-10-CM | POA: Insufficient documentation

## 2023-04-16 DIAGNOSIS — K921 Melena: Secondary | ICD-10-CM | POA: Insufficient documentation

## 2023-04-16 DIAGNOSIS — Z853 Personal history of malignant neoplasm of breast: Secondary | ICD-10-CM | POA: Insufficient documentation

## 2023-04-16 DIAGNOSIS — Z87891 Personal history of nicotine dependence: Secondary | ICD-10-CM | POA: Insufficient documentation

## 2023-04-16 DIAGNOSIS — R531 Weakness: Secondary | ICD-10-CM | POA: Insufficient documentation

## 2023-04-17 ENCOUNTER — Inpatient Hospital Stay: Payer: 59 | Admitting: Oncology

## 2023-04-17 ENCOUNTER — Inpatient Hospital Stay: Payer: 59

## 2023-04-17 DIAGNOSIS — Z87891 Personal history of nicotine dependence: Secondary | ICD-10-CM | POA: Diagnosis not present

## 2023-04-17 DIAGNOSIS — D509 Iron deficiency anemia, unspecified: Secondary | ICD-10-CM

## 2023-04-17 DIAGNOSIS — I10 Essential (primary) hypertension: Secondary | ICD-10-CM | POA: Diagnosis not present

## 2023-04-17 DIAGNOSIS — Z8249 Family history of ischemic heart disease and other diseases of the circulatory system: Secondary | ICD-10-CM | POA: Diagnosis not present

## 2023-04-17 DIAGNOSIS — K921 Melena: Secondary | ICD-10-CM | POA: Diagnosis not present

## 2023-04-17 DIAGNOSIS — Z923 Personal history of irradiation: Secondary | ICD-10-CM | POA: Diagnosis not present

## 2023-04-17 DIAGNOSIS — Z79899 Other long term (current) drug therapy: Secondary | ICD-10-CM | POA: Diagnosis not present

## 2023-04-17 DIAGNOSIS — J449 Chronic obstructive pulmonary disease, unspecified: Secondary | ICD-10-CM | POA: Diagnosis not present

## 2023-04-17 DIAGNOSIS — R531 Weakness: Secondary | ICD-10-CM | POA: Diagnosis not present

## 2023-04-17 DIAGNOSIS — Z853 Personal history of malignant neoplasm of breast: Secondary | ICD-10-CM | POA: Diagnosis not present

## 2023-04-17 DIAGNOSIS — Z90710 Acquired absence of both cervix and uterus: Secondary | ICD-10-CM | POA: Diagnosis not present

## 2023-04-17 LAB — CBC WITH DIFFERENTIAL/PLATELET
Abs Immature Granulocytes: 0 10*3/uL (ref 0.00–0.07)
Basophils Absolute: 0 10*3/uL (ref 0.0–0.1)
Basophils Relative: 1 %
Eosinophils Absolute: 0.1 10*3/uL (ref 0.0–0.5)
Eosinophils Relative: 2 %
HCT: 24.1 % — ABNORMAL LOW (ref 36.0–46.0)
Hemoglobin: 7.2 g/dL — ABNORMAL LOW (ref 12.0–15.0)
Immature Granulocytes: 0 %
Lymphocytes Relative: 32 %
Lymphs Abs: 1.3 10*3/uL (ref 0.7–4.0)
MCH: 22.1 pg — ABNORMAL LOW (ref 26.0–34.0)
MCHC: 29.9 g/dL — ABNORMAL LOW (ref 30.0–36.0)
MCV: 73.9 fL — ABNORMAL LOW (ref 80.0–100.0)
Monocytes Absolute: 0.4 10*3/uL (ref 0.1–1.0)
Monocytes Relative: 11 %
Neutro Abs: 2.2 10*3/uL (ref 1.7–7.7)
Neutrophils Relative %: 54 %
Platelets: 333 10*3/uL (ref 150–400)
RBC: 3.26 MIL/uL — ABNORMAL LOW (ref 3.87–5.11)
RDW: 22.8 % — ABNORMAL HIGH (ref 11.5–15.5)
WBC: 4 10*3/uL (ref 4.0–10.5)
nRBC: 0 % (ref 0.0–0.2)

## 2023-04-17 LAB — IRON AND TIBC
Iron: 21 ug/dL — ABNORMAL LOW (ref 28–170)
Saturation Ratios: 5 % — ABNORMAL LOW (ref 10.4–31.8)
TIBC: 427 ug/dL (ref 250–450)
UIBC: 406 ug/dL

## 2023-04-17 LAB — SAMPLE TO BLOOD BANK

## 2023-04-17 LAB — FERRITIN: Ferritin: 24 ng/mL (ref 11–307)

## 2023-04-19 ENCOUNTER — Other Ambulatory Visit: Payer: Self-pay | Admitting: *Deleted

## 2023-04-19 DIAGNOSIS — D509 Iron deficiency anemia, unspecified: Secondary | ICD-10-CM

## 2023-04-24 ENCOUNTER — Encounter: Payer: Self-pay | Admitting: Oncology

## 2023-04-24 ENCOUNTER — Inpatient Hospital Stay: Payer: 59

## 2023-04-24 ENCOUNTER — Inpatient Hospital Stay (HOSPITAL_BASED_OUTPATIENT_CLINIC_OR_DEPARTMENT_OTHER): Payer: 59 | Admitting: Oncology

## 2023-04-24 ENCOUNTER — Other Ambulatory Visit: Payer: Self-pay | Admitting: *Deleted

## 2023-04-24 VITALS — BP 114/65 | HR 92 | Temp 96.8°F | Resp 16 | Ht 64.0 in | Wt 162.0 lb

## 2023-04-24 DIAGNOSIS — D509 Iron deficiency anemia, unspecified: Secondary | ICD-10-CM

## 2023-04-24 LAB — CBC WITH DIFFERENTIAL/PLATELET
Abs Immature Granulocytes: 0.01 10*3/uL (ref 0.00–0.07)
Basophils Absolute: 0 10*3/uL (ref 0.0–0.1)
Basophils Relative: 1 %
Eosinophils Absolute: 0 10*3/uL (ref 0.0–0.5)
Eosinophils Relative: 1 %
HCT: 22 % — ABNORMAL LOW (ref 36.0–46.0)
Hemoglobin: 6.5 g/dL — CL (ref 12.0–15.0)
Immature Granulocytes: 0 %
Lymphocytes Relative: 34 %
Lymphs Abs: 1.2 10*3/uL (ref 0.7–4.0)
MCH: 21.5 pg — ABNORMAL LOW (ref 26.0–34.0)
MCHC: 29.5 g/dL — ABNORMAL LOW (ref 30.0–36.0)
MCV: 72.8 fL — ABNORMAL LOW (ref 80.0–100.0)
Monocytes Absolute: 0.4 10*3/uL (ref 0.1–1.0)
Monocytes Relative: 12 %
Neutro Abs: 1.8 10*3/uL (ref 1.7–7.7)
Neutrophils Relative %: 52 %
Platelets: 377 10*3/uL (ref 150–400)
RBC: 3.02 MIL/uL — ABNORMAL LOW (ref 3.87–5.11)
RDW: 20.9 % — ABNORMAL HIGH (ref 11.5–15.5)
WBC: 3.5 10*3/uL — ABNORMAL LOW (ref 4.0–10.5)
nRBC: 0 % (ref 0.0–0.2)

## 2023-04-24 LAB — SAMPLE TO BLOOD BANK

## 2023-04-24 LAB — PREPARE RBC (CROSSMATCH)

## 2023-04-24 NOTE — Progress Notes (Signed)
[facility]  Telephone:(336) [phone] Fax:(336) [phone]  ID: Hailey Farrell OB: 1951-04-24  MR#: [id number]  OZD#:[id number]  Patient Care Team: Oswaldo Conroy, MD as PCP - General (Family Medicine) Jeralyn Ruths, MD as Consulting Physician (Oncology)  CHIEF COMPLAINT: Iron deficiency anemia  INTERVAL HISTORY: Patient returns to clinic today for repeat laboratory, further evaluation, and consideration of blood transfusion.  She has increased weakness and fatigue over the past several weeks.  She also continues to notice dark stools.  She has no neurologic complaints.  She denies any recent fevers or illnesses.  She has a good appetite and denies weight loss. She denies any chest pain, shortness of breath, cough, or hemoptysis.  She denies any nausea, vomiting, constipation, or diarrhea.  She denies any hematochezia.  She has no urinary complaints.  Patient offers no further specific complaints today.  REVIEW OF SYSTEMS:   Review of Systems  Constitutional:  Positive for malaise/fatigue. Negative for fever and weight loss.  Respiratory: Negative.  Negative for cough and shortness of breath.   Cardiovascular: Negative.  Negative for chest pain and leg swelling.  Gastrointestinal:  Positive for melena. Negative for abdominal pain and blood in stool.  Genitourinary: Negative.  Negative for hematuria.  Musculoskeletal: Negative.  Negative for back pain.  Skin: Negative.  Negative for rash.  Neurological:  Positive for weakness. Negative for dizziness, focal weakness and headaches.  Psychiatric/Behavioral: Negative.  The patient is not nervous/anxious.     As per HPI. Otherwise, a complete review of systems is negative.  PAST MEDICAL HISTORY: Past Medical History:  Diagnosis Date   Anemia    Arthritis    Breast cancer (HCC) 1990   Right   COPD (chronic obstructive pulmonary disease) (HCC)    Dyspnea    DOE   Elevated lipids    Hypertension    Lower  extremity edema    Migraines    Migraines    Personal history of radiation therapy 1990   Breast   Restless leg syndrome    Rotator cuff injury     PAST SURGICAL HISTORY: Past Surgical History:  Procedure Laterality Date   ABDOMINAL HYSTERECTOMY     BACK SURGERY  08/2017   CERVICAL FUSION   BREAST BIOPSY Right 1990   LUMPECTOMY.  took lymph nodes   BREAST LUMPECTOMY     COLONOSCOPY WITH PROPOFOL N/A 07/20/2017   Procedure: COLONOSCOPY WITH PROPOFOL;  Surgeon: Pasty Spillers, MD;  Location: [facility] ENDOSCOPY;  Service: Endoscopy;  Laterality: N/A;   COLONOSCOPY WITH PROPOFOL N/A 01/27/2022   Procedure: COLONOSCOPY WITH PROPOFOL;  Surgeon: [name], MD;  Location: [facility] ENDOSCOPY;  Service: Gastroenterology;  Laterality: N/A;   ENTEROSCOPY N/A 08/08/2017   Procedure: ENTEROSCOPY;  Surgeon: Toney Reil, MD;  Location: [facility] ENDOSCOPY;  Service: Gastroenterology;  Laterality: N/A;   ENTEROSCOPY N/A 05/07/2020   Procedure: ENTEROSCOPY with Adult colonoscope;  Surgeon: Pasty Spillers, MD;  Location: [facility] ENDOSCOPY;  Service: Endoscopy;  Laterality: N/A;   ENTEROSCOPY N/A 03/17/2021   Procedure: ENTEROSCOPY;  Surgeon: Pasty Spillers, MD;  Location: [facility] ENDOSCOPY;  Service: Endoscopy;  Laterality: N/A;  PUSH   ENTEROSCOPY N/A 07/13/2021   Procedure: ENTEROSCOPY;  Surgeon: Toney Reil, MD;  Location: [facility] ENDOSCOPY;  Service: Gastroenterology;  Laterality: N/A;   ENTEROSCOPY N/A 10/26/2021   Procedure: ENTEROSCOPY;  Surgeon: Toney Reil, MD;  Location: [facility] ENDOSCOPY;  Service: Gastroenterology;  Laterality: N/A;  push   ENTEROSCOPY N/A 07/27/2022  Procedure: ENTEROSCOPY;  Surgeon: Midge Minium, MD;  Location: [facility] ENDOSCOPY;  Service: Endoscopy;  Laterality: N/A;  Push   ESOPHAGOGASTRODUODENOSCOPY Left 04/13/2017   Procedure: ESOPHAGOGASTRODUODENOSCOPY (EGD);  Surgeon: Pasty Spillers, MD;  Location: [facility] ENDOSCOPY;  Service: Endoscopy;   Laterality: Left;   ESOPHAGOGASTRODUODENOSCOPY (EGD) WITH PROPOFOL N/A 07/20/2017   Procedure: ESOPHAGOGASTRODUODENOSCOPY (EGD) WITH PROPOFOL;  Surgeon: Pasty Spillers, MD;  Location: [facility] ENDOSCOPY;  Service: Endoscopy;  Laterality: N/A;   ESOPHAGOGASTRODUODENOSCOPY (EGD) WITH PROPOFOL N/A 12/12/2017   Procedure: ESOPHAGOGASTRODUODENOSCOPY (EGD) WITH PROPOFOL;  Surgeon: Pasty Spillers, MD;  Location: [facility] ENDOSCOPY;  Service: Endoscopy;  Laterality: N/A;   ESOPHAGOGASTRODUODENOSCOPY (EGD) WITH PROPOFOL N/A 12/23/2020   Procedure: ESOPHAGOGASTRODUODENOSCOPY (EGD) WITH PROPOFOL;  Surgeon: Pasty Spillers, MD;  Location: [facility] ENDOSCOPY;  Service: Endoscopy;  Laterality: N/A;   ESOPHAGOGASTRODUODENOSCOPY (EGD) WITH PROPOFOL N/A 01/27/2022   Procedure: ESOPHAGOGASTRODUODENOSCOPY (EGD) WITH PROPOFOL;  Surgeon: [name], MD;  Location: [facility] ENDOSCOPY;  Service: Gastroenterology;  Laterality: N/A;   GIVENS CAPSULE STUDY N/A 03/10/2021   Procedure: GIVENS CAPSULE STUDY;  Surgeon: Pasty Spillers, MD;  Location: [facility] ENDOSCOPY;  Service: Endoscopy;  Laterality: N/A;   MEDIAL PARTIAL KNEE REPLACEMENT Left 1990   torn ligament. no knee replacement at that time   PARTIAL HYSTERECTOMY     SHOULDER ARTHROSCOPY WITH OPEN ROTATOR CUFF REPAIR Left 03/01/2016   Procedure: SHOULDER ARTHROSCOPY WITH OPEN ROTATOR CUFF REPAIR;  Surgeon: Juanell Fairly, MD;  Location: [facility] ORS;  Service: Orthopedics;  Laterality: Left;   TOTAL KNEE ARTHROPLASTY Left 02/14/2018   Procedure: TOTAL KNEE ARTHROPLASTY;  Surgeon: Juanell Fairly, MD;  Location: [facility] ORS;  Service: Orthopedics;  Laterality: Left;    FAMILY HISTORY: Family History  Problem Relation Age of Onset   CAD Mother    CAD Sister    Breast cancer Neg Hx     ADVANCED DIRECTIVES (Y/N):  N  HEALTH MAINTENANCE: Social History   Tobacco Use   Smoking status: Former    Current packs/day: 0.00    Average packs/day: 0.3 packs/day for  50.0 years (12.5 ttl pk-yrs)    Types: Cigarettes    Start date: 03/13/1966    Quit date: 03/13/2016    Years since quitting: 7.1   Smokeless tobacco: Never  Vaping Use   Vaping status: Never Used  Substance Use Topics   Alcohol use: No   Drug use: No     Colonoscopy:  PAP:  Bone density:  Lipid panel:  Allergies  Allergen Reactions   Latex Rash    Welts over body    Current Outpatient Medications  Medication Sig Dispense Refill   acetaminophen (TYLENOL) 650 MG CR tablet Take 650 mg by mouth every 8 (eight) hours as needed for pain.     albuterol (VENTOLIN HFA) 108 (90 Base) MCG/ACT inhaler Inhale 2 puffs into the lungs every 6 (six) hours as needed for wheezing.     amitriptyline (ELAVIL) 25 MG tablet Take 25 mg by mouth at bedtime.     budesonide-formoterol (SYMBICORT) 160-4.5 MCG/ACT inhaler Inhale 2 puffs into the lungs 2 (two) times daily.     diphenhydramine-acetaminophen (TYLENOL PM) 25-500 MG TABS tablet Take 1-2 tablets by mouth at bedtime as needed (pain).     furosemide (LASIX) 20 MG tablet Take 20 mg daily by mouth.      HYDROcodone-acetaminophen (NORCO/VICODIN) 5-325 MG tablet Take 1 tablet by mouth every 6 (six) hours as needed.     lisinopril (PRINIVIL,ZESTRIL) 20 MG tablet Take 20 mg  by mouth daily.     rOPINIRole (REQUIP) 2 MG tablet Take 2-4 mg by mouth at bedtime as needed (restless leg syndrome).     vitamin B-12 (CYANOCOBALAMIN) 1000 MCG tablet Take 1,000 mcg by mouth daily.     No current facility-administered medications for this visit.   Facility-Administered Medications Ordered in Other Visits  Medication Dose Route Frequency Provider Last Rate Last Admin   0.9 %  sodium chloride infusion   Intravenous Continuous Creig Hines, MD 20 mL/hr at 10/26/21 1058 Continued from Pre-op at 10/26/21 1058    OBJECTIVE: Vitals:   04/24/23 0837  BP: 114/65  Pulse: 92  Resp: 16  Temp: (!) 96.8 F (36 C)  SpO2: 100%      Body mass index is 27.81 kg/m.     ECOG FS:0 - Asymptomatic  General: Well-developed, well-nourished, no acute distress. Eyes: Pink conjunctiva, anicteric sclera. HEENT: Normocephalic, moist mucous membranes. Lungs: No audible wheezing or coughing. Heart: Regular rate and rhythm. Abdomen: Soft, nontender, no obvious distention. Musculoskeletal: No edema, cyanosis, or clubbing. Neuro: Alert, answering all questions appropriately. Cranial nerves grossly intact. Skin: No rashes or petechiae noted. Psych: Normal affect.  LAB RESULTS:  Lab Results  Component Value Date   NA 141 01/27/2022   K 3.7 01/27/2022   CL 112 (H) 01/27/2022   CO2 25 01/27/2022   GLUCOSE 86 01/27/2022   BUN 10 01/27/2022   CREATININE 0.72 01/27/2022   CALCIUM 8.5 (L) 01/27/2022   PROT 6.2 (L) 01/25/2022   ALBUMIN 3.8 01/25/2022   AST 15 01/25/2022   ALT 13 01/25/2022   ALKPHOS 30 (L) 01/25/2022   BILITOT 0.5 01/25/2022   GFRNONAA >60 01/27/2022   GFRAA >60 02/16/2018    Lab Results  Component Value Date   WBC 3.5 (L) 04/24/2023   NEUTROABS 1.8 04/24/2023   HGB 6.5 (LL) 04/24/2023   HCT 22.0 (L) 04/24/2023   MCV 72.8 (L) 04/24/2023   PLT 377 04/24/2023   Lab Results  Component Value Date   IRON 21 (L) 04/17/2023   TIBC 427 04/17/2023   IRONPCTSAT 5 (L) 04/17/2023   Lab Results  Component Value Date   FERRITIN 24 04/17/2023     STUDIES: No results found.   ASSESSMENT: Iron deficiency anemia.  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  Argon plasma was used for hemostasis.  Patient's hemoglobin has significantly dropped to 6.5 and she is symptomatic.  Given the persistent decline in her hemoglobin, suspect she is actively having a GI bleed.  Return to clinic on Thursday for 2 units of packed red blood cells.  Patient will then return to clinic in 1 week for further evaluation and consideration of additional blood.    History of AVMs: Appreciate GI input.  Patient has declined further IM octreotide at this time.  Patient has received a total of 7 monthly doses and last received treatment on September 22, 2022.  A referral has been sent back to GI for further evaluation.  I spent a total of 30 minutes reviewing chart data, face-to-face evaluation with the patient, counseling and coordination of care as detailed above.   Patient expressed understanding and was in agreement with this plan. She also understands that She can call clinic at any time with any questions, concerns, or complaints.    Jeralyn Ruths, MD   04/24/2023 9:53 AM

## 2023-04-24 NOTE — Progress Notes (Signed)
Having a lot of fatigue, weakness, and dark stools. Did not notice a difference after last blood and iron infusion.

## 2023-04-26 ENCOUNTER — Inpatient Hospital Stay: Payer: 59

## 2023-04-26 DIAGNOSIS — D509 Iron deficiency anemia, unspecified: Secondary | ICD-10-CM

## 2023-04-26 MED ORDER — ACETAMINOPHEN 325 MG PO TABS
650.0000 mg | ORAL_TABLET | Freq: Once | ORAL | Status: AC
  Administered 2023-04-26: 650 mg via ORAL
  Filled 2023-04-26: qty 2

## 2023-04-26 MED ORDER — DIPHENHYDRAMINE HCL 50 MG/ML IJ SOLN
25.0000 mg | Freq: Once | INTRAMUSCULAR | Status: AC
  Administered 2023-04-26: 25 mg via INTRAVENOUS
  Filled 2023-04-26: qty 1

## 2023-04-26 MED ORDER — SODIUM CHLORIDE 0.9% IV SOLUTION
250.0000 mL | INTRAVENOUS | Status: DC
Start: 2023-04-26 — End: 2023-04-26
  Administered 2023-04-26: 50 mL via INTRAVENOUS
  Filled 2023-04-26: qty 250

## 2023-04-26 NOTE — Patient Instructions (Signed)

## 2023-04-27 LAB — TYPE AND SCREEN
ABO/RH(D): O POS
Antibody Screen: NEGATIVE
Unit division: 0
Unit division: 0

## 2023-04-27 LAB — BPAM RBC
Blood Product Expiration Date: 202412152359
Blood Product Expiration Date: 202412152359
ISSUE DATE / TIME: 202411210815
ISSUE DATE / TIME: 202411211007
Unit Type and Rh: 5100
Unit Type and Rh: 5100

## 2023-05-01 ENCOUNTER — Encounter: Payer: Self-pay | Admitting: Oncology

## 2023-05-01 ENCOUNTER — Inpatient Hospital Stay: Payer: 59

## 2023-05-01 ENCOUNTER — Inpatient Hospital Stay (HOSPITAL_BASED_OUTPATIENT_CLINIC_OR_DEPARTMENT_OTHER): Payer: 59 | Admitting: Oncology

## 2023-05-01 VITALS — BP 123/85 | HR 89 | Temp 96.5°F | Resp 16 | Ht 60.0 in | Wt 164.1 lb

## 2023-05-01 DIAGNOSIS — D509 Iron deficiency anemia, unspecified: Secondary | ICD-10-CM

## 2023-05-01 LAB — CBC WITH DIFFERENTIAL/PLATELET
Abs Immature Granulocytes: 0.02 10*3/uL (ref 0.00–0.07)
Basophils Absolute: 0 10*3/uL (ref 0.0–0.1)
Basophils Relative: 1 %
Eosinophils Absolute: 0 10*3/uL (ref 0.0–0.5)
Eosinophils Relative: 1 %
HCT: 26.6 % — ABNORMAL LOW (ref 36.0–46.0)
Hemoglobin: 8.2 g/dL — ABNORMAL LOW (ref 12.0–15.0)
Immature Granulocytes: 1 %
Lymphocytes Relative: 30 %
Lymphs Abs: 1.2 10*3/uL (ref 0.7–4.0)
MCH: 23.5 pg — ABNORMAL LOW (ref 26.0–34.0)
MCHC: 30.8 g/dL (ref 30.0–36.0)
MCV: 76.2 fL — ABNORMAL LOW (ref 80.0–100.0)
Monocytes Absolute: 0.4 10*3/uL (ref 0.1–1.0)
Monocytes Relative: 10 %
Neutro Abs: 2.3 10*3/uL (ref 1.7–7.7)
Neutrophils Relative %: 57 %
Platelets: 318 10*3/uL (ref 150–400)
RBC: 3.49 MIL/uL — ABNORMAL LOW (ref 3.87–5.11)
RDW: 22 % — ABNORMAL HIGH (ref 11.5–15.5)
WBC: 4 10*3/uL (ref 4.0–10.5)
nRBC: 0 % (ref 0.0–0.2)

## 2023-05-01 LAB — SAMPLE TO BLOOD BANK

## 2023-05-01 NOTE — Progress Notes (Signed)
[facility]  Telephone:(336) [phone] Fax:(336) [phone]  ID: NYYA DUSKIN OB: 06/14/1950  MR#: [id number]  AOZ#:[id number]  Patient Care Team: Oswaldo Conroy, MD as PCP - General (Family Medicine) Jeralyn Ruths, MD as Consulting Physician (Oncology)  CHIEF COMPLAINT: Iron deficiency anemia  INTERVAL HISTORY: Patient returns to clinic today for repeat laboratory work, further evaluation, and consideration of additional blood.  She feels significantly improved after receiving 2 units of packed red blood cells last week.  Despite this, she still has weakness and fatigue. She also continues to notice dark stools.  She has no neurologic complaints.  She denies any recent fevers or illnesses.  She has a good appetite and denies weight loss. She denies any chest pain, shortness of breath, cough, or hemoptysis.  She denies any nausea, vomiting, constipation, or diarrhea.  She denies any hematochezia.  She has no urinary complaints.  Patient offers no further specific complaints today.  REVIEW OF SYSTEMS:   Review of Systems  Constitutional:  Positive for malaise/fatigue. Negative for fever and weight loss.  Respiratory: Negative.  Negative for cough and shortness of breath.   Cardiovascular: Negative.  Negative for chest pain and leg swelling.  Gastrointestinal:  Positive for melena. Negative for abdominal pain and blood in stool.  Genitourinary: Negative.  Negative for hematuria.  Musculoskeletal: Negative.  Negative for back pain.  Skin: Negative.  Negative for rash.  Neurological:  Positive for weakness. Negative for dizziness, focal weakness and headaches.  Psychiatric/Behavioral: Negative.  The patient is not nervous/anxious.     As per HPI. Otherwise, a complete review of systems is negative.  PAST MEDICAL HISTORY: Past Medical History:  Diagnosis Date   Anemia    Arthritis    Breast cancer (HCC) 1990   Right   COPD (chronic obstructive pulmonary  disease) (HCC)    Dyspnea    DOE   Elevated lipids    Hypertension    Lower extremity edema    Migraines    Migraines    Personal history of radiation therapy 1990   Breast   Restless leg syndrome    Rotator cuff injury     PAST SURGICAL HISTORY: Past Surgical History:  Procedure Laterality Date   ABDOMINAL HYSTERECTOMY     BACK SURGERY  08/2017   CERVICAL FUSION   BREAST BIOPSY Right 1990   LUMPECTOMY.  took lymph nodes   BREAST LUMPECTOMY     COLONOSCOPY WITH PROPOFOL N/A 07/20/2017   Procedure: COLONOSCOPY WITH PROPOFOL;  Surgeon: Pasty Spillers, MD;  Location: [facility] ENDOSCOPY;  Service: Endoscopy;  Laterality: N/A;   COLONOSCOPY WITH PROPOFOL N/A 01/27/2022   Procedure: COLONOSCOPY WITH PROPOFOL;  Surgeon: [name], MD;  Location: [facility] ENDOSCOPY;  Service: Gastroenterology;  Laterality: N/A;   ENTEROSCOPY N/A 08/08/2017   Procedure: ENTEROSCOPY;  Surgeon: Toney Reil, MD;  Location: [facility] ENDOSCOPY;  Service: Gastroenterology;  Laterality: N/A;   ENTEROSCOPY N/A 05/07/2020   Procedure: ENTEROSCOPY with Adult colonoscope;  Surgeon: Pasty Spillers, MD;  Location: [facility] ENDOSCOPY;  Service: Endoscopy;  Laterality: N/A;   ENTEROSCOPY N/A 03/17/2021   Procedure: ENTEROSCOPY;  Surgeon: Pasty Spillers, MD;  Location: [facility] ENDOSCOPY;  Service: Endoscopy;  Laterality: N/A;  PUSH   ENTEROSCOPY N/A 07/13/2021   Procedure: ENTEROSCOPY;  Surgeon: Toney Reil, MD;  Location: [facility] ENDOSCOPY;  Service: Gastroenterology;  Laterality: N/A;   ENTEROSCOPY N/A 10/26/2021   Procedure: ENTEROSCOPY;  Surgeon: Toney Reil, MD;  Location: [facility] ENDOSCOPY;  Service: Gastroenterology;  Laterality: N/A;  push   ENTEROSCOPY N/A 07/27/2022   Procedure: ENTEROSCOPY;  Surgeon: Midge Minium, MD;  Location: [facility] ENDOSCOPY;  Service: Endoscopy;  Laterality: N/A;  Push   ESOPHAGOGASTRODUODENOSCOPY Left 04/13/2017   Procedure: ESOPHAGOGASTRODUODENOSCOPY (EGD);  Surgeon:  Pasty Spillers, MD;  Location: [facility] ENDOSCOPY;  Service: Endoscopy;  Laterality: Left;   ESOPHAGOGASTRODUODENOSCOPY (EGD) WITH PROPOFOL N/A 07/20/2017   Procedure: ESOPHAGOGASTRODUODENOSCOPY (EGD) WITH PROPOFOL;  Surgeon: Pasty Spillers, MD;  Location: [facility] ENDOSCOPY;  Service: Endoscopy;  Laterality: N/A;   ESOPHAGOGASTRODUODENOSCOPY (EGD) WITH PROPOFOL N/A 12/12/2017   Procedure: ESOPHAGOGASTRODUODENOSCOPY (EGD) WITH PROPOFOL;  Surgeon: Pasty Spillers, MD;  Location: [facility] ENDOSCOPY;  Service: Endoscopy;  Laterality: N/A;   ESOPHAGOGASTRODUODENOSCOPY (EGD) WITH PROPOFOL N/A 12/23/2020   Procedure: ESOPHAGOGASTRODUODENOSCOPY (EGD) WITH PROPOFOL;  Surgeon: Pasty Spillers, MD;  Location: [facility] ENDOSCOPY;  Service: Endoscopy;  Laterality: N/A;   ESOPHAGOGASTRODUODENOSCOPY (EGD) WITH PROPOFOL N/A 01/27/2022   Procedure: ESOPHAGOGASTRODUODENOSCOPY (EGD) WITH PROPOFOL;  Surgeon: [name], MD;  Location: [facility] ENDOSCOPY;  Service: Gastroenterology;  Laterality: N/A;   GIVENS CAPSULE STUDY N/A 03/10/2021   Procedure: GIVENS CAPSULE STUDY;  Surgeon: Pasty Spillers, MD;  Location: [facility] ENDOSCOPY;  Service: Endoscopy;  Laterality: N/A;   MEDIAL PARTIAL KNEE REPLACEMENT Left 1990   torn ligament. no knee replacement at that time   PARTIAL HYSTERECTOMY     SHOULDER ARTHROSCOPY WITH OPEN ROTATOR CUFF REPAIR Left 03/01/2016   Procedure: SHOULDER ARTHROSCOPY WITH OPEN ROTATOR CUFF REPAIR;  Surgeon: Juanell Fairly, MD;  Location: [facility] ORS;  Service: Orthopedics;  Laterality: Left;   TOTAL KNEE ARTHROPLASTY Left 02/14/2018   Procedure: TOTAL KNEE ARTHROPLASTY;  Surgeon: Juanell Fairly, MD;  Location: [facility] ORS;  Service: Orthopedics;  Laterality: Left;    FAMILY HISTORY: Family History  Problem Relation Age of Onset   CAD Mother    CAD Sister    Breast cancer Neg Hx     ADVANCED DIRECTIVES (Y/N):  N  HEALTH MAINTENANCE: Social History   Tobacco Use   Smoking status:  Former    Current packs/day: 0.00    Average packs/day: 0.3 packs/day for 50.0 years (12.5 ttl pk-yrs)    Types: Cigarettes    Start date: 03/13/1966    Quit date: 03/13/2016    Years since quitting: 7.1   Smokeless tobacco: Never  Vaping Use   Vaping status: Never Used  Substance Use Topics   Alcohol use: No   Drug use: No     Colonoscopy:  PAP:  Bone density:  Lipid panel:  Allergies  Allergen Reactions   Latex Rash    Welts over body    Current Outpatient Medications  Medication Sig Dispense Refill   acetaminophen (TYLENOL) 650 MG CR tablet Take 650 mg by mouth every 8 (eight) hours as needed for pain.     albuterol (VENTOLIN HFA) 108 (90 Base) MCG/ACT inhaler Inhale 2 puffs into the lungs every 6 (six) hours as needed for wheezing.     amitriptyline (ELAVIL) 25 MG tablet Take 25 mg by mouth at bedtime.     budesonide-formoterol (SYMBICORT) 160-4.5 MCG/ACT inhaler Inhale 2 puffs into the lungs 2 (two) times daily.     diphenhydramine-acetaminophen (TYLENOL PM) 25-500 MG TABS tablet Take 1-2 tablets by mouth at bedtime as needed (pain).     furosemide (LASIX) 20 MG tablet Take 20 mg daily by mouth.      HYDROcodone-acetaminophen (NORCO/VICODIN) 5-325 MG tablet Take 1 tablet by mouth every 6 (six) hours  as needed.     lisinopril (PRINIVIL,ZESTRIL) 20 MG tablet Take 20 mg by mouth daily.     rOPINIRole (REQUIP) 2 MG tablet Take 2-4 mg by mouth at bedtime as needed (restless leg syndrome).     vitamin B-12 (CYANOCOBALAMIN) 1000 MCG tablet Take 1,000 mcg by mouth daily.     No current facility-administered medications for this visit.   Facility-Administered Medications Ordered in Other Visits  Medication Dose Route Frequency Provider Last Rate Last Admin   0.9 %  sodium chloride infusion   Intravenous Continuous Creig Hines, MD 20 mL/hr at 10/26/21 1058 Continued from Pre-op at 10/26/21 1058    OBJECTIVE: Vitals:   05/01/23 0924  BP: 123/85  Pulse: 89  Resp: 16   Temp: (!) 96.5 F (35.8 C)  SpO2: 99%      Body mass index is 32.05 kg/m.    ECOG FS:1 - Symptomatic but completely ambulatory  General: Well-developed, well-nourished, no acute distress. Eyes: Pink conjunctiva, anicteric sclera. HEENT: Normocephalic, moist mucous membranes. Lungs: No audible wheezing or coughing. Heart: Regular rate and rhythm. Abdomen: Soft, nontender, no obvious distention. Musculoskeletal: No edema, cyanosis, or clubbing. Neuro: Alert, answering all questions appropriately. Cranial nerves grossly intact. Skin: No rashes or petechiae noted. Psych: Normal affect.  LAB RESULTS:  Lab Results  Component Value Date   NA 141 01/27/2022   K 3.7 01/27/2022   CL 112 (H) 01/27/2022   CO2 25 01/27/2022   GLUCOSE 86 01/27/2022   BUN 10 01/27/2022   CREATININE 0.72 01/27/2022   CALCIUM 8.5 (L) 01/27/2022   PROT 6.2 (L) 01/25/2022   ALBUMIN 3.8 01/25/2022   AST 15 01/25/2022   ALT 13 01/25/2022   ALKPHOS 30 (L) 01/25/2022   BILITOT 0.5 01/25/2022   GFRNONAA >60 01/27/2022   GFRAA >60 02/16/2018    Lab Results  Component Value Date   WBC 4.0 05/01/2023   NEUTROABS 2.3 05/01/2023   HGB 8.2 (L) 05/01/2023   HCT 26.6 (L) 05/01/2023   MCV 76.2 (L) 05/01/2023   PLT 318 05/01/2023   Lab Results  Component Value Date   IRON 21 (L) 04/17/2023   TIBC 427 04/17/2023   IRONPCTSAT 5 (L) 04/17/2023   Lab Results  Component Value Date   FERRITIN 24 04/17/2023     STUDIES: No results found.   ASSESSMENT: Iron deficiency anemia.  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  Argon plasma was used for hemostasis.  Patient's hemoglobin has improved to 8.2 with 2 units of packed red blood cells.  Despite still being symptomatic, patient does not wish to return tomorrow for additional blood stating she has to cook for Thanksgiving dinner.  GI has reached out to the  patient, but she has not answered her phone.  She has been instructed to call their office to make an appointment for luminal evaluation.  Return to clinic in 2 weeks with repeat laboratory work, further evaluation, and additional treatment if necessary.     History of AVMs: Appreciate GI input.  Patient has declined further IM octreotide at this time.  Patient has received a total of 7 monthly doses and last received treatment on September 22, 2022.  Patient has been instructed to call their office as above.  I spent a total of 20 minutes reviewing chart data, face-to-face evaluation with the patient, counseling and coordination of care as  detailed above.    Patient expressed understanding and was in agreement with this plan. She also understands that She can call clinic at any time with any questions, concerns, or complaints.    Jeralyn Ruths, MD   05/01/2023 9:36 AM

## 2023-05-02 ENCOUNTER — Inpatient Hospital Stay: Payer: 59

## 2023-05-02 ENCOUNTER — Ambulatory Visit: Payer: 59

## 2023-05-16 ENCOUNTER — Other Ambulatory Visit: Payer: Self-pay

## 2023-05-16 ENCOUNTER — Inpatient Hospital Stay (HOSPITAL_BASED_OUTPATIENT_CLINIC_OR_DEPARTMENT_OTHER): Payer: 59 | Admitting: Oncology

## 2023-05-16 ENCOUNTER — Encounter: Payer: Self-pay | Admitting: Oncology

## 2023-05-16 ENCOUNTER — Inpatient Hospital Stay: Payer: 59 | Attending: Oncology

## 2023-05-16 ENCOUNTER — Other Ambulatory Visit: Payer: Self-pay | Admitting: *Deleted

## 2023-05-16 VITALS — BP 131/82 | HR 103 | Temp 96.7°F | Resp 16 | Ht 60.0 in | Wt 163.0 lb

## 2023-05-16 DIAGNOSIS — D509 Iron deficiency anemia, unspecified: Secondary | ICD-10-CM | POA: Diagnosis not present

## 2023-05-16 DIAGNOSIS — Z79899 Other long term (current) drug therapy: Secondary | ICD-10-CM | POA: Insufficient documentation

## 2023-05-16 LAB — CBC WITH DIFFERENTIAL/PLATELET
Abs Immature Granulocytes: 0.01 10*3/uL (ref 0.00–0.07)
Basophils Absolute: 0 10*3/uL (ref 0.0–0.1)
Basophils Relative: 0 %
Eosinophils Absolute: 0.1 10*3/uL (ref 0.0–0.5)
Eosinophils Relative: 1 %
HCT: 23.5 % — ABNORMAL LOW (ref 36.0–46.0)
Hemoglobin: 7.2 g/dL — ABNORMAL LOW (ref 12.0–15.0)
Immature Granulocytes: 0 %
Lymphocytes Relative: 23 %
Lymphs Abs: 1 10*3/uL (ref 0.7–4.0)
MCH: 22.6 pg — ABNORMAL LOW (ref 26.0–34.0)
MCHC: 30.6 g/dL (ref 30.0–36.0)
MCV: 73.9 fL — ABNORMAL LOW (ref 80.0–100.0)
Monocytes Absolute: 0.4 10*3/uL (ref 0.1–1.0)
Monocytes Relative: 10 %
Neutro Abs: 2.8 10*3/uL (ref 1.7–7.7)
Neutrophils Relative %: 66 %
Platelets: 363 10*3/uL (ref 150–400)
RBC: 3.18 MIL/uL — ABNORMAL LOW (ref 3.87–5.11)
RDW: 22.8 % — ABNORMAL HIGH (ref 11.5–15.5)
WBC: 4.3 10*3/uL (ref 4.0–10.5)
nRBC: 0 % (ref 0.0–0.2)

## 2023-05-16 LAB — PREPARE RBC (CROSSMATCH)

## 2023-05-16 NOTE — Progress Notes (Signed)
[facility]  Telephone:(336) [phone] Fax:(336) [phone]  ID: Hailey Farrell OB: 1951-04-30  MR#: [id number]  YQM#:[id number]  Patient Care Team: Oswaldo Conroy, MD as PCP - General (Family Medicine) Jeralyn Ruths, MD as Consulting Physician (Oncology)  CHIEF COMPLAINT: Iron deficiency anemia  INTERVAL HISTORY: Patient returns to clinic today for repeat laboratory, further evaluation, and consideration of additional blood.  She continues to have chronic weakness and fatigue, but does feel mildly improved.  She continues to have dark stools.  She otherwise feels well.  She has no neurologic complaints.  She denies any recent fevers or illnesses.  She has a good appetite and denies weight loss. She denies any chest pain, shortness of breath, cough, or hemoptysis.  She denies any nausea, vomiting, constipation, or diarrhea.  She denies any hematochezia.  She has no urinary complaints.  Patient offers no further specific complaints today.  REVIEW OF SYSTEMS:   Review of Systems  Constitutional:  Positive for malaise/fatigue. Negative for fever and weight loss.  Respiratory: Negative.  Negative for cough and shortness of breath.   Cardiovascular: Negative.  Negative for chest pain and leg swelling.  Gastrointestinal:  Positive for melena. Negative for abdominal pain and blood in stool.  Genitourinary: Negative.  Negative for hematuria.  Musculoskeletal: Negative.  Negative for back pain.  Skin: Negative.  Negative for rash.  Neurological:  Positive for weakness. Negative for dizziness, focal weakness and headaches.  Psychiatric/Behavioral: Negative.  The patient is not nervous/anxious.     As per HPI. Otherwise, a complete review of systems is negative.  PAST MEDICAL HISTORY: Past Medical History:  Diagnosis Date   Anemia    Arthritis    Breast cancer (HCC) 1990   Right   COPD (chronic obstructive pulmonary disease) (HCC)    Dyspnea    DOE   Elevated  lipids    Hypertension    Lower extremity edema    Migraines    Migraines    Personal history of radiation therapy 1990   Breast   Restless leg syndrome    Rotator cuff injury     PAST SURGICAL HISTORY: Past Surgical History:  Procedure Laterality Date   ABDOMINAL HYSTERECTOMY     BACK SURGERY  08/2017   CERVICAL FUSION   BREAST BIOPSY Right 1990   LUMPECTOMY.  took lymph nodes   BREAST LUMPECTOMY     COLONOSCOPY WITH PROPOFOL N/A 07/20/2017   Procedure: COLONOSCOPY WITH PROPOFOL;  Surgeon: Pasty Spillers, MD;  Location: [facility] ENDOSCOPY;  Service: Endoscopy;  Laterality: N/A;   COLONOSCOPY WITH PROPOFOL N/A 01/27/2022   Procedure: COLONOSCOPY WITH PROPOFOL;  Surgeon: [name], MD;  Location: [facility] ENDOSCOPY;  Service: Gastroenterology;  Laterality: N/A;   ENTEROSCOPY N/A 08/08/2017   Procedure: ENTEROSCOPY;  Surgeon: Toney Reil, MD;  Location: [facility] ENDOSCOPY;  Service: Gastroenterology;  Laterality: N/A;   ENTEROSCOPY N/A 05/07/2020   Procedure: ENTEROSCOPY with Adult colonoscope;  Surgeon: Pasty Spillers, MD;  Location: [facility] ENDOSCOPY;  Service: Endoscopy;  Laterality: N/A;   ENTEROSCOPY N/A 03/17/2021   Procedure: ENTEROSCOPY;  Surgeon: Pasty Spillers, MD;  Location: [facility] ENDOSCOPY;  Service: Endoscopy;  Laterality: N/A;  PUSH   ENTEROSCOPY N/A 07/13/2021   Procedure: ENTEROSCOPY;  Surgeon: Toney Reil, MD;  Location: [facility] ENDOSCOPY;  Service: Gastroenterology;  Laterality: N/A;   ENTEROSCOPY N/A 10/26/2021   Procedure: ENTEROSCOPY;  Surgeon: Toney Reil, MD;  Location: [facility] ENDOSCOPY;  Service: Gastroenterology;  Laterality: N/A;  push  ENTEROSCOPY N/A 07/27/2022   Procedure: ENTEROSCOPY;  Surgeon: Midge Minium, MD;  Location: [facility] ENDOSCOPY;  Service: Endoscopy;  Laterality: N/A;  Push   ESOPHAGOGASTRODUODENOSCOPY Left 04/13/2017   Procedure: ESOPHAGOGASTRODUODENOSCOPY (EGD);  Surgeon: Pasty Spillers, MD;  Location: [facility]  ENDOSCOPY;  Service: Endoscopy;  Laterality: Left;   ESOPHAGOGASTRODUODENOSCOPY (EGD) WITH PROPOFOL N/A 07/20/2017   Procedure: ESOPHAGOGASTRODUODENOSCOPY (EGD) WITH PROPOFOL;  Surgeon: Pasty Spillers, MD;  Location: [facility] ENDOSCOPY;  Service: Endoscopy;  Laterality: N/A;   ESOPHAGOGASTRODUODENOSCOPY (EGD) WITH PROPOFOL N/A 12/12/2017   Procedure: ESOPHAGOGASTRODUODENOSCOPY (EGD) WITH PROPOFOL;  Surgeon: Pasty Spillers, MD;  Location: [facility] ENDOSCOPY;  Service: Endoscopy;  Laterality: N/A;   ESOPHAGOGASTRODUODENOSCOPY (EGD) WITH PROPOFOL N/A 12/23/2020   Procedure: ESOPHAGOGASTRODUODENOSCOPY (EGD) WITH PROPOFOL;  Surgeon: Pasty Spillers, MD;  Location: [facility] ENDOSCOPY;  Service: Endoscopy;  Laterality: N/A;   ESOPHAGOGASTRODUODENOSCOPY (EGD) WITH PROPOFOL N/A 01/27/2022   Procedure: ESOPHAGOGASTRODUODENOSCOPY (EGD) WITH PROPOFOL;  Surgeon: [name], MD;  Location: [facility] ENDOSCOPY;  Service: Gastroenterology;  Laterality: N/A;   GIVENS CAPSULE STUDY N/A 03/10/2021   Procedure: GIVENS CAPSULE STUDY;  Surgeon: Pasty Spillers, MD;  Location: [facility] ENDOSCOPY;  Service: Endoscopy;  Laterality: N/A;   MEDIAL PARTIAL KNEE REPLACEMENT Left 1990   torn ligament. no knee replacement at that time   PARTIAL HYSTERECTOMY     SHOULDER ARTHROSCOPY WITH OPEN ROTATOR CUFF REPAIR Left 03/01/2016   Procedure: SHOULDER ARTHROSCOPY WITH OPEN ROTATOR CUFF REPAIR;  Surgeon: Juanell Fairly, MD;  Location: [facility] ORS;  Service: Orthopedics;  Laterality: Left;   TOTAL KNEE ARTHROPLASTY Left 02/14/2018   Procedure: TOTAL KNEE ARTHROPLASTY;  Surgeon: Juanell Fairly, MD;  Location: [facility] ORS;  Service: Orthopedics;  Laterality: Left;    FAMILY HISTORY: Family History  Problem Relation Age of Onset   CAD Mother    CAD Sister    Breast cancer Neg Hx     ADVANCED DIRECTIVES (Y/N):  N  HEALTH MAINTENANCE: Social History   Tobacco Use   Smoking status: Former    Current packs/day: 0.00     Average packs/day: 0.3 packs/day for 50.0 years (12.5 ttl pk-yrs)    Types: Cigarettes    Start date: 03/13/1966    Quit date: 03/13/2016    Years since quitting: 7.1   Smokeless tobacco: Never  Vaping Use   Vaping status: Never Used  Substance Use Topics   Alcohol use: No   Drug use: No     Colonoscopy:  PAP:  Bone density:  Lipid panel:  Allergies  Allergen Reactions   Latex Rash    Welts over body    Current Outpatient Medications  Medication Sig Dispense Refill   acetaminophen (TYLENOL) 650 MG CR tablet Take 650 mg by mouth every 8 (eight) hours as needed for pain.     albuterol (VENTOLIN HFA) 108 (90 Base) MCG/ACT inhaler Inhale 2 puffs into the lungs every 6 (six) hours as needed for wheezing.     amitriptyline (ELAVIL) 25 MG tablet Take 25 mg by mouth at bedtime.     budesonide-formoterol (SYMBICORT) 160-4.5 MCG/ACT inhaler Inhale 2 puffs into the lungs 2 (two) times daily.     diphenhydramine-acetaminophen (TYLENOL PM) 25-500 MG TABS tablet Take 1-2 tablets by mouth at bedtime as needed (pain).     furosemide (LASIX) 20 MG tablet Take 20 mg daily by mouth.      HYDROcodone-acetaminophen (NORCO/VICODIN) 5-325 MG tablet Take 1 tablet by mouth every 6 (six) hours as needed.     lisinopril (PRINIVIL,ZESTRIL) 20  MG tablet Take 20 mg by mouth daily.     rOPINIRole (REQUIP) 2 MG tablet Take 2-4 mg by mouth at bedtime as needed (restless leg syndrome).     vitamin B-12 (CYANOCOBALAMIN) 1000 MCG tablet Take 1,000 mcg by mouth daily.     No current facility-administered medications for this visit.   Facility-Administered Medications Ordered in Other Visits  Medication Dose Route Frequency Provider Last Rate Last Admin   0.9 %  sodium chloride infusion   Intravenous Continuous Creig Hines, MD 20 mL/hr at 10/26/21 1058 Continued from Pre-op at 10/26/21 1058    OBJECTIVE: Vitals:   05/16/23 0938  BP: 131/82  Pulse: (!) 103  Resp: 16  Temp: (!) 96.7 F (35.9 C)  SpO2:  100%      Body mass index is 31.83 kg/m.    ECOG FS:1 - Symptomatic but completely ambulatory  General: Well-developed, well-nourished, no acute distress. Eyes: Pink conjunctiva, anicteric sclera. HEENT: Normocephalic, moist mucous membranes. Lungs: No audible wheezing or coughing. Heart: Regular rate and rhythm. Abdomen: Soft, nontender, no obvious distention. Musculoskeletal: No edema, cyanosis, or clubbing. Neuro: Alert, answering all questions appropriately. Cranial nerves grossly intact. Skin: No rashes or petechiae noted. Psych: Normal affect.  LAB RESULTS:  Lab Results  Component Value Date   NA 141 01/27/2022   K 3.7 01/27/2022   CL 112 (H) 01/27/2022   CO2 25 01/27/2022   GLUCOSE 86 01/27/2022   BUN 10 01/27/2022   CREATININE 0.72 01/27/2022   CALCIUM 8.5 (L) 01/27/2022   PROT 6.2 (L) 01/25/2022   ALBUMIN 3.8 01/25/2022   AST 15 01/25/2022   ALT 13 01/25/2022   ALKPHOS 30 (L) 01/25/2022   BILITOT 0.5 01/25/2022   GFRNONAA >60 01/27/2022   GFRAA >60 02/16/2018    Lab Results  Component Value Date   WBC 4.3 05/16/2023   NEUTROABS 2.8 05/16/2023   HGB 7.2 (L) 05/16/2023   HCT 23.5 (L) 05/16/2023   MCV 73.9 (L) 05/16/2023   PLT 363 05/16/2023   Lab Results  Component Value Date   IRON 21 (L) 04/17/2023   TIBC 427 04/17/2023   IRONPCTSAT 5 (L) 04/17/2023   Lab Results  Component Value Date   FERRITIN 24 04/17/2023     STUDIES: No results found.   ASSESSMENT: Iron deficiency anemia.  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  Argon plasma was used for hemostasis.  Patient's hemoglobin is trended down again and is now 7.2.  This is likely secondary to ongoing GI bleed and patient has an appointment with GI tomorrow.  Return to clinic tomorrow morning for 1 unit of packed red blood cells prior to her GI appointment.  Patient will then return to clinic  in 1 week for repeat laboratory work, further evaluation, and consideration of additional blood.    History of AVMs: Appreciate GI input.  Patient has declined further IM octreotide at this time.  Patient has received a total of 7 monthly doses and last received treatment on September 22, 2022.  Follow-up with GI tomorrow.  I spent a total of 30 minutes reviewing chart data, face-to-face evaluation with the patient, counseling and coordination of care as detailed above.     Patient expressed understanding and was in agreement with this plan. She also understands that She can call clinic at any time with any questions, concerns, or complaints.  Jeralyn Ruths, MD   05/16/2023 10:09 AM

## 2023-05-17 ENCOUNTER — Inpatient Hospital Stay: Payer: 59

## 2023-05-17 ENCOUNTER — Ambulatory Visit (INDEPENDENT_AMBULATORY_CARE_PROVIDER_SITE_OTHER): Payer: 59 | Admitting: Gastroenterology

## 2023-05-17 ENCOUNTER — Encounter: Payer: Self-pay | Admitting: Gastroenterology

## 2023-05-17 VITALS — BP 152/78 | HR 90 | Temp 98.2°F | Ht 60.0 in | Wt 166.2 lb

## 2023-05-17 DIAGNOSIS — D500 Iron deficiency anemia secondary to blood loss (chronic): Secondary | ICD-10-CM

## 2023-05-17 DIAGNOSIS — D509 Iron deficiency anemia, unspecified: Secondary | ICD-10-CM

## 2023-05-17 DIAGNOSIS — K921 Melena: Secondary | ICD-10-CM

## 2023-05-17 DIAGNOSIS — Z8719 Personal history of other diseases of the digestive system: Secondary | ICD-10-CM

## 2023-05-17 MED ORDER — DIPHENHYDRAMINE HCL 50 MG/ML IJ SOLN
25.0000 mg | Freq: Once | INTRAMUSCULAR | Status: AC
  Administered 2023-05-17: 25 mg via INTRAVENOUS
  Filled 2023-05-17: qty 1

## 2023-05-17 MED ORDER — SODIUM CHLORIDE 0.9% IV SOLUTION
250.0000 mL | INTRAVENOUS | Status: DC
  Administered 2023-05-17: 100 mL via INTRAVENOUS
  Filled 2023-05-17: qty 250

## 2023-05-17 MED ORDER — ACETAMINOPHEN 325 MG PO TABS
650.0000 mg | ORAL_TABLET | Freq: Once | ORAL | Status: AC
  Administered 2023-05-17: 650 mg via ORAL
  Filled 2023-05-17: qty 2

## 2023-05-17 NOTE — Progress Notes (Signed)
Hailey Repress, MD [address]  [address]  [place], [place] [address]  Main: [phone]  Fax: [phone]    Gastroenterology Consultation  Referring Provider:     Oswaldo Conroy, MD Primary Care Physician:  Oswaldo Conroy, MD Primary Gastroenterologist:  Dr. Arlyss Farrell Reason for Consultation: Chronic iron deficiency anemia, melena        HPI:   Hailey Farrell is a 72 y.o. female referred by Dr. Hessie Diener, Earl Lagos, MD  for consultation & management of chronic iron deficiency anemia.  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 pylori.  Patient has been receiving parenteral iron therapy.  Patient was admitted to [facility] on 07/12/2021 secondary to acute on chronic iron deficiency anemia, hemoglobin dropped to 5.8 from 8.2, received blood transfusion as well as iron infusions.  She reports followed by hematology since discharge, has been receiving parenteral iron therapy.  She recently had another acute on chronic blood loss anemia on 5/8, preceding this she reported 2 to 3 days of black stools, her repeat hemoglobin dropped from 9.8 to 6.5, she received 1 unit of PRBCs at the cancer center along with IV iron.  She is currently scheduled to receive weekly iron infusions.  Patient currently reports having brown bowel movements.  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  Patient was recently admitted to [facility] on 01/25/2022 secondary to 2 weeks history of melena.  Her hemoglobin on admission was 5.1, received 4 units of PRBCs and underwent colonoscopy with small bowel enteroscopy with no source of bleeding  identified.  Her hemoglobin at the time of discharge was 8.7 on 01/28/2022.  Patient tells me that she waited so long hoping that her bleeding would stop but she became very weak.  She is currently having brown bowel movements daily.  She is accompanied by her husband.  Video capsule endoscopy from 01/27/2022 revealed a nonbleeding small AVM in small bowel.  Follow-up visit 07/26/2022 Patient reports 2 days history of black tarry stools.  She had 1 bowel movement day before yesterday, 2 yesterday and 1 this morning.  She feels slightly tired.  She is tearful about bleeding again and is frustrated with recurrent bleeding and multiple procedures.  She is receiving octreotide injections as well as iron infusions.  Patient is accompanied by her husband today.  She denies any other GI symptoms.  Denies any alcohol use, NSAID use.  She is not taking any oral iron supplements  Follow-up visit 05/17/2023 Hailey Farrell is here for follow-up of worsening iron deficiency anemia.  She reports having 1 month history of black tarry stools, received 1 unit of PRBCs at cancer center today because of severe symptomatic anemia and hemoglobin of 7.2.  She received blood transfusion in September as well as in November due to critically low hemoglobin of 5.8 and 6.5 respectively.  Patient is tearful and frustrated with ongoing anemia requiring multiple blood transfusions.  She is no longer on octreotide injections, felt not working.  She is not on any oral iron.  She reports feeling tired, fatigue, loss of appetite.  She is accompanied by her  husband today  NSAIDs: None  Antiplts/Anticoagulants/Anti thrombotics: None  GI Procedures: Reviewed under procedures tab  Past Medical History:  Diagnosis Date   Anemia    Arthritis    Breast cancer (HCC) 1990   Right   COPD (chronic obstructive pulmonary disease) (HCC)    Dyspnea    DOE   Elevated lipids    Hypertension    Lower extremity edema    Migraines    Migraines     Personal history of radiation therapy 1990   Breast   Restless leg syndrome    Rotator cuff injury     Past Surgical History:  Procedure Laterality Date   ABDOMINAL HYSTERECTOMY     BACK SURGERY  08/2017   CERVICAL FUSION   BREAST BIOPSY Right 1990   LUMPECTOMY.  took lymph nodes   BREAST LUMPECTOMY     COLONOSCOPY WITH PROPOFOL N/A 07/20/2017   Procedure: COLONOSCOPY WITH PROPOFOL;  Surgeon: Pasty Spillers, MD;  Location: [facility] ENDOSCOPY;  Service: Endoscopy;  Laterality: N/A;   COLONOSCOPY WITH PROPOFOL N/A 01/27/2022   Procedure: COLONOSCOPY WITH PROPOFOL;  Surgeon: [name], MD;  Location: [facility] ENDOSCOPY;  Service: Gastroenterology;  Laterality: N/A;   ENTEROSCOPY N/A 08/08/2017   Procedure: ENTEROSCOPY;  Surgeon: Toney Reil, MD;  Location: [facility] ENDOSCOPY;  Service: Gastroenterology;  Laterality: N/A;   ENTEROSCOPY N/A 05/07/2020   Procedure: ENTEROSCOPY with Adult colonoscope;  Surgeon: Pasty Spillers, MD;  Location: [facility] ENDOSCOPY;  Service: Endoscopy;  Laterality: N/A;   ENTEROSCOPY N/A 03/17/2021   Procedure: ENTEROSCOPY;  Surgeon: Pasty Spillers, MD;  Location: [facility] ENDOSCOPY;  Service: Endoscopy;  Laterality: N/A;  PUSH   ENTEROSCOPY N/A 07/13/2021   Procedure: ENTEROSCOPY;  Surgeon: Toney Reil, MD;  Location: [facility] ENDOSCOPY;  Service: Gastroenterology;  Laterality: N/A;   ENTEROSCOPY N/A 10/26/2021   Procedure: ENTEROSCOPY;  Surgeon: Toney Reil, MD;  Location: [facility] ENDOSCOPY;  Service: Gastroenterology;  Laterality: N/A;  push   ENTEROSCOPY N/A 07/27/2022   Procedure: ENTEROSCOPY;  Surgeon: Midge Minium, MD;  Location: [facility] ENDOSCOPY;  Service: Endoscopy;  Laterality: N/A;  Push   ESOPHAGOGASTRODUODENOSCOPY Left 04/13/2017   Procedure: ESOPHAGOGASTRODUODENOSCOPY (EGD);  Surgeon: Pasty Spillers, MD;  Location: [facility] ENDOSCOPY;  Service: Endoscopy;  Laterality: Left;   ESOPHAGOGASTRODUODENOSCOPY (EGD) WITH PROPOFOL N/A 07/20/2017    Procedure: ESOPHAGOGASTRODUODENOSCOPY (EGD) WITH PROPOFOL;  Surgeon: Pasty Spillers, MD;  Location: [facility] ENDOSCOPY;  Service: Endoscopy;  Laterality: N/A;   ESOPHAGOGASTRODUODENOSCOPY (EGD) WITH PROPOFOL N/A 12/12/2017   Procedure: ESOPHAGOGASTRODUODENOSCOPY (EGD) WITH PROPOFOL;  Surgeon: Pasty Spillers, MD;  Location: [facility] ENDOSCOPY;  Service: Endoscopy;  Laterality: N/A;   ESOPHAGOGASTRODUODENOSCOPY (EGD) WITH PROPOFOL N/A 12/23/2020   Procedure: ESOPHAGOGASTRODUODENOSCOPY (EGD) WITH PROPOFOL;  Surgeon: Pasty Spillers, MD;  Location: [facility] ENDOSCOPY;  Service: Endoscopy;  Laterality: N/A;   ESOPHAGOGASTRODUODENOSCOPY (EGD) WITH PROPOFOL N/A 01/27/2022   Procedure: ESOPHAGOGASTRODUODENOSCOPY (EGD) WITH PROPOFOL;  Surgeon: [name], MD;  Location: [facility] ENDOSCOPY;  Service: Gastroenterology;  Laterality: N/A;   GIVENS CAPSULE STUDY N/A 03/10/2021   Procedure: GIVENS CAPSULE STUDY;  Surgeon: Pasty Spillers, MD;  Location: [facility] ENDOSCOPY;  Service: Endoscopy;  Laterality: N/A;   MEDIAL PARTIAL KNEE REPLACEMENT Left 1990   torn ligament. no knee replacement at that time   PARTIAL HYSTERECTOMY     SHOULDER ARTHROSCOPY WITH OPEN ROTATOR CUFF REPAIR Left 03/01/2016   Procedure: SHOULDER ARTHROSCOPY WITH OPEN ROTATOR CUFF REPAIR;  Surgeon: Juanell Fairly, MD;  Location: [facility] ORS;  Service: Orthopedics;  Laterality: Left;   TOTAL KNEE ARTHROPLASTY Left 02/14/2018   Procedure: TOTAL KNEE ARTHROPLASTY;  Surgeon: Juanell Fairly, MD;  Location: [facility] ORS;  Service: Orthopedics;  Laterality: Left;    Current Outpatient Medications:    acetaminophen (TYLENOL) 650 MG CR tablet, Take 650 mg by mouth every 8 (eight) hours as needed for pain., Disp: , Rfl:    albuterol (VENTOLIN HFA) 108 (90 Base) MCG/ACT inhaler, Inhale 2 puffs into the lungs every 6 (six) hours as needed for wheezing., Disp: , Rfl:    amitriptyline (ELAVIL) 25 MG tablet, Take 25 mg by mouth at bedtime., Disp: , Rfl:     budesonide-formoterol (SYMBICORT) 160-4.5 MCG/ACT inhaler, Inhale 2 puffs into the lungs 2 (two) times daily., Disp: , Rfl:    diphenhydramine-acetaminophen (TYLENOL PM) 25-500 MG TABS tablet, Take 1-2 tablets by mouth at bedtime as needed (pain)., Disp: , Rfl:    furosemide (LASIX) 20 MG tablet, Take 20 mg daily by mouth. , Disp: , Rfl:    lisinopril (PRINIVIL,ZESTRIL) 20 MG tablet, Take 20 mg by mouth daily., Disp: , Rfl:    potassium chloride (KLOR-CON M) 10 MEQ tablet, Take 10 mEq by mouth daily., Disp: , Rfl:    rOPINIRole (REQUIP) 2 MG tablet, Take 2-4 mg by mouth at bedtime as needed (restless leg syndrome)., Disp: , Rfl:    simvastatin (ZOCOR) 20 MG tablet, Take 20 mg by mouth at bedtime., Disp: , Rfl:    vitamin B-12 (CYANOCOBALAMIN) 1000 MCG tablet, Take 1,000 mcg by mouth daily., Disp: , Rfl:  No current facility-administered medications for this visit.  Facility-Administered Medications Ordered in Other Visits:    0.9 %  sodium chloride infusion, , Intravenous, Continuous, Creig Hines, MD, Last Rate: 20 mL/hr at 10/26/21 1058, Continued from Pre-op at 10/26/21 1058    Family History  Problem Relation Age of Onset   CAD Mother    CAD Sister    Breast cancer Neg Hx      Social History   Tobacco Use   Smoking status: Some Days    Current packs/day: 0.00    Average packs/day: 0.3 packs/day for 50.0 years (12.5 ttl pk-yrs)    Types: Cigarettes    Start date: 03/13/1966    Last attempt to quit: 03/13/2016    Years since quitting: 7.1   Smokeless tobacco: Never  Vaping Use   Vaping status: Never Used  Substance Use Topics   Alcohol use: No   Drug use: No    Allergies as of 05/17/2023 - Review Complete 05/17/2023  Allergen Reaction Noted   Latex Rash 02/03/2015    Review of Systems:    All systems reviewed and negative except where noted in HPI.   Physical Exam:  BP (!) 152/78 (BP Location: Left Arm, Patient Position: Sitting, Cuff Size: Large)   Pulse 90    Temp 98.2 F (36.8 C) (Oral)   Ht 5' (1.524 m)   Wt 166 lb 4 oz (75.4 kg)   BMI 32.47 kg/m  No LMP recorded. Patient has had a hysterectomy.  General:   Alert,  Well-developed, well-nourished, pleasant and cooperative in NAD Head:  Normocephalic and atraumatic. Eyes:  Sclera clear, no icterus.   Conjunctiva pink. Ears:  Normal auditory acuity. Nose:  No deformity, discharge, or lesions. Mouth:  No deformity or lesions,oropharynx pink & moist. Neck:  Supple; no masses or thyromegaly. Lungs:  Respirations even and unlabored.  Clear throughout to auscultation.   No wheezes, crackles, or rhonchi. No  acute distress. Heart:  Regular rate and rhythm; no murmurs, clicks, rubs, or gallops. Abdomen:  Normal bowel sounds. Soft, non-tender and non-distended without masses, hepatosplenomegaly or hernias noted.  No guarding or rebound tenderness.   Rectal: Not performed Msk:  Symmetrical without gross deformities. Good, equal movement & strength bilaterally. Pulses:  Normal pulses noted. Extremities:  No clubbing or edema.  No cyanosis. Neurologic:  Alert and oriented x3;  grossly normal neurologically. Skin:  Intact without significant lesions or rashes. No jaundice. Psych:  Alert and cooperative. Normal mood and affect.  Imaging Studies: No abdominal imaging  Assessment and Plan:   Hailey Farrell is a 72 y.o. 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  Patient underwent repeat colonoscopy as well as small bowel enteroscopy, small bowel AVMs were not identified.  She also underwent video capsule endoscopy on 01/27/2022 which revealed a small nonbleeding AVM in the small intestine.  She has also received about 7 monthly octreotide injections, dependent on parenteral iron therapy  and blood transfusions as needed Now, presents with 1 month history of melena, requiring blood transfusion in September, November as well as in December 2024. Recommend Meckel's scan Recommend urgent referral to [facility] for double-balloon enteroscopy Check CBC, BMP, intrinsic factor antibodies and parietal cell antibodies.  If her hemoglobin is significantly low, advised her to go to [facility] or [facility] and get admitted to be evaluated by GI for double-balloon enteroscopy due to refractory obscure GI bleed    Follow up with [facility]   Hailey Repress, MD

## 2023-05-17 NOTE — Patient Instructions (Addendum)
Gave you samples of Fusion plus samples. Take 1 capsule every other day.  Your meckl's scan is scheduled for 05/18/2023 [facility] 11[facility]30[facility] for a 12:00pm scan. Nothing to eat or drink 4 hours before the scan.

## 2023-05-18 ENCOUNTER — Ambulatory Visit
Admission: RE | Admit: 2023-05-18 | Discharge: 2023-05-18 | Disposition: A | Discharge status: Home / Self Care | Payer: 59 | Source: Ambulatory Visit | Attending: Gastroenterology | Admitting: Gastroenterology

## 2023-05-18 ENCOUNTER — Telehealth: Payer: Self-pay

## 2023-05-18 DIAGNOSIS — K921 Melena: Secondary | ICD-10-CM | POA: Diagnosis present

## 2023-05-18 DIAGNOSIS — D500 Iron deficiency anemia secondary to blood loss (chronic): Secondary | ICD-10-CM | POA: Diagnosis present

## 2023-05-18 LAB — BPAM RBC
Blood Product Expiration Date: 202412142359
ISSUE DATE / TIME: 202412120825
Unit Type and Rh: 9500

## 2023-05-18 LAB — TYPE AND SCREEN
ABO/RH(D): O POS
Antibody Screen: NEGATIVE
Unit division: 0

## 2023-05-18 MED ORDER — SODIUM PERTECHNETATE TC 99M INJECTION
10.6700 | Freq: Once | INTRAVENOUS | Status: AC | PRN
  Administered 2023-05-18: 10.51 via INTRAVENOUS

## 2023-05-18 NOTE — Telephone Encounter (Signed)
Placed referral to [facility] for a Balloon enteroscopy. Faxed last office visit notes, labs, Procedures, Demographics, insurance and medication list

## 2023-05-21 ENCOUNTER — Telehealth: Payer: Self-pay

## 2023-05-21 LAB — CBC WITH DIFFERENTIAL/PLATELET
Basophils Absolute: 0 10*3/uL (ref 0.0–0.2)
Basos: 1 %
EOS (ABSOLUTE): 0 10*3/uL (ref 0.0–0.4)
Eos: 1 %
Hematocrit: 27.2 % — ABNORMAL LOW (ref 34.0–46.6)
Hemoglobin: 8.2 g/dL — ABNORMAL LOW (ref 11.1–15.9)
Immature Grans (Abs): 0 10*3/uL (ref 0.0–0.1)
Immature Granulocytes: 0 %
Lymphocytes Absolute: 1.3 10*3/uL (ref 0.7–3.1)
Lymphs: 28 %
MCH: 23.7 pg — ABNORMAL LOW (ref 26.6–33.0)
MCHC: 30.1 g/dL — ABNORMAL LOW (ref 31.5–35.7)
MCV: 79 fL (ref 79–97)
Monocytes Absolute: 0.5 10*3/uL (ref 0.1–0.9)
Monocytes: 11 %
Neutrophils Absolute: 2.7 10*3/uL (ref 1.4–7.0)
Neutrophils: 59 %
Platelets: 387 10*3/uL (ref 150–450)
RBC: 3.46 x10E6/uL — ABNORMAL LOW (ref 3.77–5.28)
RDW: 17.7 % — ABNORMAL HIGH (ref 11.7–15.4)
WBC: 4.6 10*3/uL (ref 3.4–10.8)

## 2023-05-21 LAB — BASIC METABOLIC PANEL
BUN/Creatinine Ratio: 23 (ref 12–28)
BUN: 14 mg/dL (ref 8–27)
CO2: 23 mmol/L (ref 20–29)
Calcium: 9.2 mg/dL (ref 8.7–10.3)
Chloride: 107 mmol/L — ABNORMAL HIGH (ref 96–106)
Creatinine, Ser: 0.62 mg/dL (ref 0.57–1.00)
Glucose: 77 mg/dL (ref 70–99)
Potassium: 4 mmol/L (ref 3.5–5.2)
Sodium: 144 mmol/L (ref 134–144)
eGFR: 94 mL/min/{1.73_m2} (ref >59)

## 2023-05-21 LAB — INTRINSIC FACTOR ANTIBODIES: Intrinsic Factor Abs, Serum: 1 [AU]/ml (ref 0.0–1.1)

## 2023-05-21 LAB — ANTI-PARIETAL ANTIBODY: Parietal Cell Ab: 4.7 U (ref 0.0–20.0)

## 2023-05-21 NOTE — Telephone Encounter (Signed)
Results of her scan she had done on Friday

## 2023-05-21 NOTE — Telephone Encounter (Signed)
Called and left a message for call back  

## 2023-05-21 NOTE — Telephone Encounter (Signed)
Patient called in let the nurse know she is going to [facility] this morning. She wants to know her result.

## 2023-05-21 NOTE — Telephone Encounter (Signed)
Her hemoglobin responded appropriately to blood transfusion.  She has ongoing black stools and history of small bowel AVMs I wanted her to go to tertiary care center to undergo double-balloon enteroscopy due to ongoing GI bleed requiring multiple blood transfusions  Meckel's scan also came back negative  Hailey Farrell

## 2023-05-21 NOTE — Telephone Encounter (Signed)
Patient left a voicemail on my phone that states Dr. Allegra Lai told her she was supposed to go to the ER at [place] and she is leaving at 10  today to go down there. She wants to know the results from Friday and the reason why she needs to go to the ER at [place]

## 2023-05-21 NOTE — Telephone Encounter (Signed)
Error

## 2023-05-21 NOTE — Telephone Encounter (Signed)
Nathanial Millman  DS   05/21/23  8:46 AM Note Patient called in let the nurse know she is going to [facility] this morning. She wants to know her result.

## 2023-05-22 ENCOUNTER — Other Ambulatory Visit: Payer: Self-pay

## 2023-05-22 DIAGNOSIS — D509 Iron deficiency anemia, unspecified: Secondary | ICD-10-CM

## 2023-05-22 DIAGNOSIS — D649 Anemia, unspecified: Secondary | ICD-10-CM

## 2023-05-22 NOTE — Telephone Encounter (Signed)
Called and left a message for call back  

## 2023-05-23 ENCOUNTER — Inpatient Hospital Stay: Payer: 59

## 2023-05-23 ENCOUNTER — Inpatient Hospital Stay: Payer: 59 | Admitting: Oncology

## 2023-05-23 NOTE — Telephone Encounter (Signed)
 Called and left a  message for call back. Unable to reach patient do you want me to send a letter

## 2023-05-24 ENCOUNTER — Inpatient Hospital Stay: Payer: 59

## 2023-06-02 ENCOUNTER — Emergency Department
Admission: EM | Admit: 2023-06-02 | Discharge: 2023-06-02 | Disposition: A | Discharge status: Home / Self Care | Payer: 59 | Attending: Emergency Medicine | Admitting: Emergency Medicine

## 2023-06-02 ENCOUNTER — Other Ambulatory Visit: Payer: Self-pay

## 2023-06-02 ENCOUNTER — Emergency Department: Payer: 59

## 2023-06-02 DIAGNOSIS — M66821 Spontaneous rupture of other tendons, right upper arm: Secondary | ICD-10-CM | POA: Insufficient documentation

## 2023-06-02 DIAGNOSIS — S46211A Strain of muscle, fascia and tendon of other parts of biceps, right arm, initial encounter: Secondary | ICD-10-CM

## 2023-06-02 DIAGNOSIS — M79601 Pain in right arm: Secondary | ICD-10-CM | POA: Diagnosis present

## 2023-06-02 MED ORDER — ONDANSETRON 4 MG PO TBDP
4.0000 mg | ORAL_TABLET | Freq: Once | ORAL | Status: AC
  Administered 2023-06-02: 4 mg via ORAL
  Filled 2023-06-02: qty 1

## 2023-06-02 MED ORDER — OXYCODONE-ACETAMINOPHEN 5-325 MG PO TABS: 1.0000 | ORAL_TABLET | Freq: Four times a day (QID) | ORAL | 0 refills | Status: DC | PRN

## 2023-06-02 MED ORDER — ONDANSETRON 4 MG PO TBDP: 4.0000 mg | ORAL_TABLET | Freq: Three times a day (TID) | ORAL | 0 refills | Status: DC | PRN

## 2023-06-02 MED ORDER — OXYCODONE-ACETAMINOPHEN 5-325 MG PO TABS
2.0000 | ORAL_TABLET | Freq: Once | ORAL | Status: AC
  Administered 2023-06-02: 2 via ORAL
  Filled 2023-06-02: qty 2

## 2023-06-02 NOTE — ED Provider Notes (Signed)
[facility] Provider Note    Event Date/Time   First MD Initiated Contact with Patient 06/02/23 1150     (approximate)   History   Arm Pain   HPI  Hailey Farrell is a 72 y.o. female here with right arm pain.  The patient states that 2 days ago, she was lifting something at home when she felt a pop in her upper shoulder.  Since then, she has had severe, aching, 10 out of 10, right arm pain that is worse along her lower upper arm.  It is localized around the lower arm and elbow.  She has pain with significant movement of the arm, particularly elbow flexion which is weak.  Denies any distal numbness or weakness.  No falls.  No trauma.  No history of similar injuries.  She is right-hand dominant.     Physical Exam   Triage Vital Signs: ED Triage Vitals [06/02/23 0820]  Encounter Vitals Group     BP 116/78     Systolic BP Percentile      Diastolic BP Percentile      Pulse Rate 94     Resp 17     Temp 98.1 F (36.7 C)     Temp Source Oral     SpO2 93 %     Weight 166 lb 4 oz (75.4 kg)     Height 5\' 4"  (1.626 m)     Head Circumference      Peak Flow      Pain Score 10     Pain Loc      Pain Education      Exclude from Growth Chart     Most recent vital signs: Vitals:   06/02/23 0820  BP: 116/78  Pulse: 94  Resp: 17  Temp: 98.1 F (36.7 C)  SpO2: 93%     General: Awake, no distress.  CV:  Good peripheral perfusion.  Resp:  Normal work of breathing.  Abd:  No distention.  Other:  Significant tenderness to palpation throughout the right upper anterior arm.  There is significant bruising and ecchymoses, particularly along the distal arm extending into the [facility] fossa.  There is an area of significant tenderness and edema with possible balled up muscle in the distal upper arm, along the inferior aspect of the arm.  Tenderness throughout the right upper anterior shoulder. Patient is able to supinate.   ED Results / Procedures / Treatments    Labs (all labs ordered are listed, but only abnormal results are displayed) Labs Reviewed - No data to display   EKG    RADIOLOGY DG shoulder right: Negative DG humerus right: Negative   I also independently reviewed and agree with radiologist interpretations.   PROCEDURES:  Critical Care performed: No   MEDICATIONS ORDERED IN ED: Medications  oxyCODONE-acetaminophen (PERCOCET/ROXICET) 5-325 MG per tablet 2 tablet (2 tablets Oral Given 06/02/23 1225)  ondansetron (ZOFRAN-ODT) disintegrating tablet 4 mg (4 mg Oral Given 06/02/23 1226)     IMPRESSION / MDM / ASSESSMENT AND PLAN / ED COURSE  I reviewed the triage vital signs and the nursing notes.                              Differential diagnosis includes, but is not limited to, proximal or distal biceps tendon rupture, biceps tear, fracture, dislocation  Patient's presentation is most consistent with acute presentation with potential threat to life  or bodily function.  The patient is on the cardiac monitor to evaluate for evidence of arrhythmia and/or significant heart rate changes   72 year old right-hand-dominant female here with right arm pain.  Clinically, suspect proximal biceps tendon tear or rupture.  Distal neurovasculature is intact.  She has bruising but compartments are soft.  No distal numbness or weakness.  Plain films are negative.  Discussed case with orthopedics, will place in a splint for comfort, sling, discharged with outpatient follow-up.  Analgesia given.  Return precautions given.  She will call orthopedics on Monday for follow-up.   FINAL CLINICAL IMPRESSION(S) / ED DIAGNOSES   Final diagnoses:  Rupture of right biceps tendon, initial encounter     Rx / DC Orders   ED Discharge Orders     None        Note:  This document was prepared using Dragon voice recognition software and may include unintentional dictation errors.   Shaune Pollack, MD 06/02/23 1259

## 2023-06-02 NOTE — ED Triage Notes (Signed)
Pt c/o of right upper arm pain and swelling x 2 days. States she was lifting a pot Thursday evening and heard a "pop".

## 2023-06-02 NOTE — Discharge Instructions (Signed)
Follow-up with Orthopedics this coming week for repeat exam and check-up

## 2023-06-11 ENCOUNTER — Telehealth: Payer: Self-pay | Admitting: Gastroenterology

## 2023-06-11 ENCOUNTER — Encounter: Payer: Self-pay | Admitting: Oncology

## 2023-06-11 NOTE — Telephone Encounter (Signed)
 The patient called in to schedule appointment from her ED visit in [facility]. The patient stated that she needs to start her shots again for the bleeding. If her hemoglobin drops again, they are going to admitted her in the hospital for blood transfusion.

## 2023-06-11 NOTE — Telephone Encounter (Signed)
 Patient schedule appointment for next available 08/09/2023 has appointment with [facility] on 06/22/2023. Please advise

## 2023-06-12 ENCOUNTER — Other Ambulatory Visit: Payer: Self-pay | Admitting: Oncology

## 2023-06-12 NOTE — Telephone Encounter (Signed)
 Please check with patient if the appointment on 1/17 is for push enteroscopy at [facility]?  If not, please schedule push enteroscopy with me ASAP She can keep follow-up appointment with me  Dr. Jacobo  Could you please restart monthly octreotide  injections?  Also, she will probably need IV iron   Thanks RV

## 2023-06-12 NOTE — Telephone Encounter (Signed)
 Tried to call patient but unable to leave a message because voicemail is full

## 2023-06-12 NOTE — Telephone Encounter (Signed)
 Tried to call patient but voicemail is full

## 2023-06-13 ENCOUNTER — Encounter: Payer: Self-pay | Admitting: Emergency Medicine

## 2023-06-13 ENCOUNTER — Emergency Department
Admission: EM | Admit: 2023-06-13 | Discharge: 2023-06-13 | Disposition: A | Discharge status: Home / Self Care | Payer: 59 | Attending: Emergency Medicine | Admitting: Emergency Medicine

## 2023-06-13 ENCOUNTER — Other Ambulatory Visit: Payer: Self-pay

## 2023-06-13 DIAGNOSIS — J449 Chronic obstructive pulmonary disease, unspecified: Secondary | ICD-10-CM | POA: Diagnosis not present

## 2023-06-13 DIAGNOSIS — I10 Essential (primary) hypertension: Secondary | ICD-10-CM | POA: Insufficient documentation

## 2023-06-13 DIAGNOSIS — D649 Anemia, unspecified: Secondary | ICD-10-CM | POA: Diagnosis present

## 2023-06-13 DIAGNOSIS — D539 Nutritional anemia, unspecified: Secondary | ICD-10-CM | POA: Insufficient documentation

## 2023-06-13 LAB — COMPREHENSIVE METABOLIC PANEL
ALT: 13 U/L (ref 0–44)
AST: 17 U/L (ref 15–41)
Albumin: 3.3 g/dL — ABNORMAL LOW (ref 3.5–5.0)
Alkaline Phosphatase: 28 U/L — ABNORMAL LOW (ref 38–126)
Anion gap: 9 (ref 5–15)
BUN: 24 mg/dL — ABNORMAL HIGH (ref 8–23)
CO2: 27 mmol/L (ref 22–32)
Calcium: 8.5 mg/dL — ABNORMAL LOW (ref 8.9–10.3)
Chloride: 104 mmol/L (ref 98–111)
Creatinine, Ser: 0.84 mg/dL (ref 0.44–1.00)
GFR, Estimated: >60 mL/min (ref >60)
Glucose, Bld: 111 mg/dL — ABNORMAL HIGH (ref 70–99)
Potassium: 3.4 mmol/L — ABNORMAL LOW (ref 3.5–5.1)
Sodium: 140 mmol/L (ref 135–145)
Total Bilirubin: 0.4 mg/dL (ref 0.0–1.2)
Total Protein: 5.7 g/dL — ABNORMAL LOW (ref 6.5–8.1)

## 2023-06-13 LAB — CBC
HCT: 20.5 % — ABNORMAL LOW (ref 36.0–46.0)
Hemoglobin: 6.1 g/dL — ABNORMAL LOW (ref 12.0–15.0)
MCH: 21.6 pg — ABNORMAL LOW (ref 26.0–34.0)
MCHC: 29.8 g/dL — ABNORMAL LOW (ref 30.0–36.0)
MCV: 72.7 fL — ABNORMAL LOW (ref 80.0–100.0)
Platelets: 453 10*3/uL — ABNORMAL HIGH (ref 150–400)
RBC: 2.82 MIL/uL — ABNORMAL LOW (ref 3.87–5.11)
RDW: 22.1 % — ABNORMAL HIGH (ref 11.5–15.5)
WBC: 3.4 10*3/uL — ABNORMAL LOW (ref 4.0–10.5)
nRBC: 0 % (ref 0.0–0.2)

## 2023-06-13 LAB — PREPARE RBC (CROSSMATCH)

## 2023-06-13 MED ORDER — SODIUM CHLORIDE 0.9 % IV SOLN: 10.0000 mL/h | Freq: Once | INTRAVENOUS | Status: DC

## 2023-06-13 NOTE — Telephone Encounter (Signed)
 The patient called back to speak with the nurse. She is currently in the hospital because her hemoglobin dropped again. She went to see her PCP yesterday and they took some blood then they sent her to go to the ER this morning.

## 2023-06-13 NOTE — ED Triage Notes (Addendum)
 Pt arrived via POV with reports of low hemoglobin from PCP's office, pt states labs drawn yesterday and was called this morning to come to the hospital.   Pt alert and oriented no distress noted.  Pt states she had labs drawn about 3 weeks ago and is lower now.   Pt reports increased weakness and feeling tired. Pt reports hx of anemia with transfusion.

## 2023-06-13 NOTE — Telephone Encounter (Signed)
 Tried to call patient mail box is full

## 2023-06-13 NOTE — ED Provider Notes (Signed)
 [facility] Provider Note    Event Date/Time   First MD Initiated Contact with Patient 06/13/23 1002     (approximate)   History   Abnormal Labs   HPI  Hailey Farrell is a 73 y.o. female past medical history significant for anemia, chronic melena, COPD, migraines, hypertension, who presents to the emergency department after being called from her primary care physician and told she needed to come to the emergency department for blood transfusion.  Patient had a recent hospitalization for melena and acute anemia.  Patient states that she has been worked up for the past 3 years and they are uncertain of what is causing her GI bleed.  Denies any NSAIDs.  Denies any anticoagulation.  States that she has had multiple EGDs, capsule endoscopies and has known small bowel AVMs.  Has had multiple recent blood transfusions.  Went to follow-up with her primary care physician yesterday and was told today that she had a low hemoglobin level and to come to the emergency department.  States that she has fatigue and just feeling feeling very tired.  She states that she is upset that they still do not know what is causing her anemia.     Physical Exam   Triage Vital Signs: ED Triage Vitals  Encounter Vitals Group     BP 06/13/23 0934 116/74     Systolic BP Percentile --      Diastolic BP Percentile --      Pulse Rate 06/13/23 0934 93     Resp 06/13/23 0934 18     Temp 06/13/23 0934 98.4 F (36.9 C)     Temp Source 06/13/23 0934 Oral     SpO2 06/13/23 0934 100 %     Weight 06/13/23 0933 166 lb 4 oz (75.4 kg)     Height 06/13/23 0933 5' 4 (1.626 m)     Head Circumference --      Peak Flow --      Pain Score 06/13/23 0932 8     Pain Loc --      Pain Education --      Exclude from Growth Chart --     Most recent vital signs: Vitals:   06/13/23 1247 06/13/23 1303  BP: 133/70 126/75  Pulse: 87 99  Resp: 17 17  Temp: 98.8 F (37.1 C) 98.4 F (36.9 C)  SpO2: 97% 98%     Physical Exam Constitutional:      Appearance: She is well-developed.  HENT:     Head: Atraumatic.  Eyes:     Conjunctiva/sclera: Conjunctivae normal.  Cardiovascular:     Rate and Rhythm: Regular rhythm.  Pulmonary:     Effort: No respiratory distress.  Abdominal:     General: There is no distension.  Musculoskeletal:        General: Normal range of motion.     Cervical back: Normal range of motion.  Skin:    General: Skin is warm.     Capillary Refill: Capillary refill takes less than 2 seconds.  Neurological:     Mental Status: She is alert. Mental status is at baseline.     IMPRESSION / MDM / ASSESSMENT AND PLAN / ED COURSE  I reviewed the triage vital signs and the nursing notes.  On chart review patient had a recent hospitalization for melena and anemia and required a blood transfusion.  Patient has had significant GI workup.  Differential diagnosis including anemia of chronic disease, acute  blood loss anemia, lower GI bleed, AVMs   LABS (all labs ordered are listed, but only abnormal results are displayed) Labs interpreted as -    Labs Reviewed  CBC - Abnormal; Notable for the following components:      Result Value   WBC 3.4 (*)    RBC 2.82 (*)    Hemoglobin 6.1 (*)    HCT 20.5 (*)    MCV 72.7 (*)    MCH 21.6 (*)    MCHC 29.8 (*)    RDW 22.1 (*)    Platelets 453 (*)    All other components within normal limits  COMPREHENSIVE METABOLIC PANEL - Abnormal; Notable for the following components:   Potassium 3.4 (*)    Glucose, Bld 111 (*)    BUN 24 (*)    Calcium 8.5 (*)    Total Protein 5.7 (*)    Albumin 3.3 (*)    Alkaline Phosphatase 28 (*)    All other components within normal limits  PREPARE RBC (CROSSMATCH)  TYPE AND SCREEN     MDM    Patient found to be anemic with a hemoglobin of 6.1.  No other significant electrolyte abnormality.  Platelets within normal limits.  Patient states that she does not want to be admitted to the hospital  again.  States that she just wants a blood transfusion and to go home and follow-up as an outpatient.  Patient has had multiple recent admissions and a significant workup for her GI bleed.  I do feel like it would be reasonable given that her melena is chronic to give a blood transfusion and have her continue to follow-up as an outpatient.  Patient given a blood transfusion in the emergency department on reevaluation states that she still feeling well.  Wants to go home and wants to follow-up as an outpatient.  Discussed calling her primary care physician for close follow-up and repeat hemoglobin.  Discussed return precautions to the emergency department for any ongoing or worsening symptoms.   PROCEDURES:  Critical Care performed: yes  .Critical Care  Performed by: Suzanne Kirsch, MD Authorized by: Suzanne Kirsch, MD   Critical care provider statement:    Critical care time (minutes):  40   Critical care time was exclusive of:  Separately billable procedures and treating other patients   Critical care was necessary to treat or prevent imminent or life-threatening deterioration of the following conditions:  Circulatory failure   Critical care was time spent personally by me on the following activities:  Development of treatment plan with patient or surrogate, discussions with consultants, evaluation of patient's response to treatment, examination of patient, ordering and review of laboratory studies, ordering and review of radiographic studies, ordering and performing treatments and interventions, pulse oximetry, re-evaluation of patient's condition and review of old charts   Patient's presentation is most consistent with exacerbation of chronic illness.   MEDICATIONS ORDERED IN ED: Medications  0.9 %  sodium chloride  infusion (0 mL/hr Intravenous Hold 06/13/23 1050)    FINAL CLINICAL IMPRESSION(S) / ED DIAGNOSES   Final diagnoses:  Acute on chronic anemia     Rx / DC Orders   ED  Discharge Orders     None        Note:  This document was prepared using Dragon voice recognition software and may include unintentional dictation errors.   Suzanne Kirsch, MD 06/13/23 1558

## 2023-06-13 NOTE — Telephone Encounter (Signed)
Tried to call patient but mailbox is full  

## 2023-06-13 NOTE — Telephone Encounter (Signed)
 Called patient [name] and voicemail is not set up unable to leave a message

## 2023-06-13 NOTE — Discharge Instructions (Addendum)
 You were seen in the emergency department for anemia with a hemoglobin of 6.1.  You received a blood transfusion in the emergency department.  Call your primary care physician because you need repeat blood work.  Return to the emergency department if you have any shortness of breath, worsening symptoms or concern for a GI bleed.  Thank you for choosing us  for your health care, it was my pleasure to care for you today!  Clotilda Punter, MD

## 2023-06-14 LAB — BPAM RBC
Blood Product Expiration Date: 202502032359
ISSUE DATE / TIME: 202501081237
Unit Type and Rh: 5100

## 2023-06-14 LAB — TYPE AND SCREEN
ABO/RH(D): O POS
Antibody Screen: NEGATIVE
Unit division: 0

## 2023-06-20 ENCOUNTER — Encounter: Payer: Self-pay | Admitting: Oncology

## 2023-06-21 ENCOUNTER — Inpatient Hospital Stay: Payer: 59 | Attending: Oncology

## 2023-06-21 ENCOUNTER — Inpatient Hospital Stay: Payer: 59 | Admitting: Oncology

## 2023-06-21 ENCOUNTER — Encounter: Payer: Self-pay | Admitting: Oncology

## 2023-06-21 ENCOUNTER — Inpatient Hospital Stay: Payer: 59

## 2023-06-21 VITALS — BP 150/75 | HR 93 | Temp 98.4°F | Resp 18

## 2023-06-21 VITALS — BP 140/76 | HR 107 | Temp 98.9°F | Ht 64.0 in | Wt 161.3 lb

## 2023-06-21 DIAGNOSIS — K5520 Angiodysplasia of colon without hemorrhage: Secondary | ICD-10-CM | POA: Diagnosis not present

## 2023-06-21 DIAGNOSIS — D509 Iron deficiency anemia, unspecified: Secondary | ICD-10-CM

## 2023-06-21 DIAGNOSIS — D649 Anemia, unspecified: Secondary | ICD-10-CM

## 2023-06-21 DIAGNOSIS — F17210 Nicotine dependence, cigarettes, uncomplicated: Secondary | ICD-10-CM | POA: Diagnosis not present

## 2023-06-21 LAB — CBC WITH DIFFERENTIAL (CANCER CENTER ONLY)
Abs Immature Granulocytes: 0.02 10*3/uL (ref 0.00–0.07)
Basophils Absolute: 0 10*3/uL (ref 0.0–0.1)
Basophils Relative: 1 %
Eosinophils Absolute: 0.1 10*3/uL (ref 0.0–0.5)
Eosinophils Relative: 1 %
HCT: 22.1 % — ABNORMAL LOW (ref 36.0–46.0)
Hemoglobin: 6.7 g/dL — CL (ref 12.0–15.0)
Immature Granulocytes: 0 %
Lymphocytes Relative: 24 %
Lymphs Abs: 1.1 10*3/uL (ref 0.7–4.0)
MCH: 22.5 pg — ABNORMAL LOW (ref 26.0–34.0)
MCHC: 30.3 g/dL (ref 30.0–36.0)
MCV: 74.2 fL — ABNORMAL LOW (ref 80.0–100.0)
Monocytes Absolute: 0.5 10*3/uL (ref 0.1–1.0)
Monocytes Relative: 11 %
Neutro Abs: 2.9 10*3/uL (ref 1.7–7.7)
Neutrophils Relative %: 63 %
Platelet Count: 432 10*3/uL — ABNORMAL HIGH (ref 150–400)
RBC: 2.98 MIL/uL — ABNORMAL LOW (ref 3.87–5.11)
RDW: 22.2 % — ABNORMAL HIGH (ref 11.5–15.5)
WBC Count: 4.6 10*3/uL (ref 4.0–10.5)
nRBC: 0 % (ref 0.0–0.2)

## 2023-06-21 LAB — SAMPLE TO BLOOD BANK

## 2023-06-21 MED ORDER — SODIUM CHLORIDE 0.9% FLUSH
10.0000 mL | Freq: Once | INTRAVENOUS | Status: AC | PRN
Start: 2023-06-21 — End: 2023-06-21
  Administered 2023-06-21: 10 mL
  Filled 2023-06-21: qty 10

## 2023-06-21 MED ORDER — OCTREOTIDE ACETATE 30 MG IM KIT
30.0000 mg | PACK | Freq: Once | INTRAMUSCULAR | Status: AC
  Administered 2023-06-21: 30 mg via INTRAMUSCULAR
  Filled 2023-06-21: qty 1

## 2023-06-21 MED ORDER — IRON SUCROSE 20 MG/ML IV SOLN
200.0000 mg | Freq: Once | INTRAVENOUS | Status: AC
  Administered 2023-06-21: 200 mg via INTRAVENOUS
  Filled 2023-06-21: qty 10

## 2023-06-21 NOTE — Progress Notes (Signed)
Tore a tendon rt arm 06/03/23, 8/10.  Seeing GI in [facility] tomorrow.  No appetite, 1 boost per day. Having occasional nausea.

## 2023-06-21 NOTE — Patient Instructions (Signed)
Iron Sucrose Injection What is this medication? IRON SUCROSE (EYE ern SOO krose) treats low levels of iron (iron deficiency anemia) in people with kidney disease. Iron is a mineral that plays an important role in making red blood cells, which carry oxygen from your lungs to the rest of your body. This medicine may be used for other purposes; ask your health care provider or pharmacist if you have questions. COMMON BRAND NAME(S): Venofer What should I tell my care team before I take this medication? They need to know if you have any of these conditions: Anemia not caused by low iron levels Heart disease High levels of iron in the blood Kidney disease Liver disease An unusual or allergic reaction to iron, other medications, foods, dyes, or preservatives Pregnant or trying to get pregnant Breastfeeding How should I use this medication? This medication is for infusion into a vein. It is given in a hospital or clinic setting. Talk to your care team about the use of this medication in children. While this medication may be prescribed for children as young as 2 years for selected conditions, precautions do apply. Overdosage: If you think you have taken too much of this medicine contact a poison control center or emergency room at once. NOTE: This medicine is only for you. Do not share this medicine with others. What if I miss a dose? Keep appointments for follow-up doses. It is important not to miss your dose. Call your care team if you are unable to keep an appointment. What may interact with this medication? Do not take this medication with any of the following: Deferoxamine Dimercaprol Other iron products This medication may also interact with the following: Chloramphenicol Deferasirox This list may not describe all possible interactions. Give your health care provider a list of all the medicines, herbs, non-prescription drugs, or dietary supplements you use. Also tell them if you smoke,  drink alcohol, or use illegal drugs. Some items may interact with your medicine. What should I watch for while using this medication? Visit your care team regularly. Tell your care team if your symptoms do not start to get better or if they get worse. You may need blood work done while you are taking this medication. You may need to follow a special diet. Talk to your care team. Foods that contain iron include: whole grains/cereals, dried fruits, beans, or peas, leafy green vegetables, and organ meats (liver, kidney). What side effects may I notice from receiving this medication? Side effects that you should report to your care team as soon as possible: Allergic reactions--skin rash, itching, hives, swelling of the face, lips, tongue, or throat Low blood pressure--dizziness, feeling faint or lightheaded, blurry vision Shortness of breath Side effects that usually do not require medical attention (report to your care team if they continue or are bothersome): Flushing Headache Joint pain Muscle pain Nausea Pain, redness, or irritation at injection site This list may not describe all possible side effects. Call your doctor for medical advice about side effects. You may report side effects to FDA at 1-800-FDA-1088. Where should I keep my medication? This medication is given in a hospital or clinic. It will not be stored at home. NOTE: This sheet is a summary. It may not cover all possible information. If you have questions about this medicine, talk to your doctor, pharmacist, or health care provider.  2024 Elsevier/Gold Standard (2022-10-27 00:00:00)

## 2023-06-21 NOTE — Progress Notes (Signed)
[facility]  [phone] [phone]) [phone]  ID: Hailey Farrell OB: Apr 15, 1951  MR#: [id number]  AOZ#:[id number]  Patient Care Team: Oswaldo Conroy, MD as PCP - General (Family Medicine) Jeralyn Ruths, MD as Consulting Physician (Oncology)  CHIEF COMPLAINT: Iron deficiency anemia  INTERVAL HISTORY: Patient returns to clinic today for repeat laboratory work, further evaluation, consideration of blood transfusion as well as reinitiation of her treatment time.  She continues chronic weakness and fatigue, but otherwise feels well.  She tore a tendon in her right arm and likely will need surgical repair, but does not know when this will occur.  She continues to have dark stools.  She has no neurologic complaints. She denies any recent fevers or illnesses.  She has a fair appetite.  She denies any chest pain, shortness of breath, cough, or hemoptysis.  She denies any nausea, vomiting, constipation, or diarrhea.  She denies any hematochezia.  She has no urinary complaints.  Patient offers no further specific complaints today.  REVIEW OF SYSTEMS:   Review of Systems  Constitutional:  Positive for malaise/fatigue. Negative for fever and weight loss.  Respiratory: Negative.  Negative for cough and shortness of breath.   Cardiovascular: Negative.  Negative for chest pain and leg swelling.  Gastrointestinal:  Positive for melena. Negative for abdominal pain and blood in stool.  Genitourinary: Negative.  Negative for hematuria.  Musculoskeletal: Negative.  Negative for back pain.  Skin: Negative.  Negative for rash.  Neurological:  Positive for weakness. Negative for dizziness, focal weakness and headaches.  Psychiatric/Behavioral: Negative.  The patient is not nervous/anxious.     As per HPI. Otherwise, a complete review of systems is negative.  PAST MEDICAL HISTORY: Past Medical History:  Diagnosis Date   Anemia    Arthritis    Breast cancer (HCC)  1990   Right   COPD (chronic obstructive pulmonary disease) (HCC)    Dyspnea    DOE   Elevated lipids    Hypertension    Lower extremity edema    Migraines    Migraines    Personal history of radiation therapy 1990   Breast   Restless leg syndrome    Rotator cuff injury     PAST SURGICAL HISTORY: Past Surgical History:  Procedure Laterality Date   ABDOMINAL HYSTERECTOMY     BACK SURGERY  08/2017   CERVICAL FUSION   BREAST BIOPSY Right 1990   LUMPECTOMY.  took lymph nodes   BREAST LUMPECTOMY     COLONOSCOPY WITH PROPOFOL N/A 07/20/2017   Procedure: COLONOSCOPY WITH PROPOFOL;  Surgeon: Pasty Spillers, MD;  Location: [facility] ENDOSCOPY;  Service: Endoscopy;  Laterality: N/A;   COLONOSCOPY WITH PROPOFOL N/A 01/27/2022   Procedure: COLONOSCOPY WITH PROPOFOL;  Surgeon: [name], MD;  Location: [facility] ENDOSCOPY;  Service: Gastroenterology;  Laterality: N/A;   ENTEROSCOPY N/A 08/08/2017   Procedure: ENTEROSCOPY;  Surgeon: Toney Reil, MD;  Location: [facility] ENDOSCOPY;  Service: Gastroenterology;  Laterality: N/A;   ENTEROSCOPY N/A 05/07/2020   Procedure: ENTEROSCOPY with Adult colonoscope;  Surgeon: Pasty Spillers, MD;  Location: [facility] ENDOSCOPY;  Service: Endoscopy;  Laterality: N/A;   ENTEROSCOPY N/A 03/17/2021   Procedure: ENTEROSCOPY;  Surgeon: Pasty Spillers, MD;  Location: [facility] ENDOSCOPY;  Service: Endoscopy;  Laterality: N/A;  PUSH   ENTEROSCOPY N/A 07/13/2021   Procedure: ENTEROSCOPY;  Surgeon: Toney Reil, MD;  Location: [facility] ENDOSCOPY;  Service: Gastroenterology;  Laterality: N/A;   ENTEROSCOPY N/A 10/26/2021   Procedure:  ENTEROSCOPY;  Surgeon: Toney Reil, MD;  Location: [facility] ENDOSCOPY;  Service: Gastroenterology;  Laterality: N/A;  push   ENTEROSCOPY N/A 07/27/2022   Procedure: ENTEROSCOPY;  Surgeon: Midge Minium, MD;  Location: [facility] ENDOSCOPY;  Service: Endoscopy;  Laterality: N/A;  Push   ESOPHAGOGASTRODUODENOSCOPY Left 04/13/2017    Procedure: ESOPHAGOGASTRODUODENOSCOPY (EGD);  Surgeon: Pasty Spillers, MD;  Location: [facility] ENDOSCOPY;  Service: Endoscopy;  Laterality: Left;   ESOPHAGOGASTRODUODENOSCOPY (EGD) WITH PROPOFOL N/A 07/20/2017   Procedure: ESOPHAGOGASTRODUODENOSCOPY (EGD) WITH PROPOFOL;  Surgeon: Pasty Spillers, MD;  Location: [facility] ENDOSCOPY;  Service: Endoscopy;  Laterality: N/A;   ESOPHAGOGASTRODUODENOSCOPY (EGD) WITH PROPOFOL N/A 12/12/2017   Procedure: ESOPHAGOGASTRODUODENOSCOPY (EGD) WITH PROPOFOL;  Surgeon: Pasty Spillers, MD;  Location: [facility] ENDOSCOPY;  Service: Endoscopy;  Laterality: N/A;   ESOPHAGOGASTRODUODENOSCOPY (EGD) WITH PROPOFOL N/A 12/23/2020   Procedure: ESOPHAGOGASTRODUODENOSCOPY (EGD) WITH PROPOFOL;  Surgeon: Pasty Spillers, MD;  Location: [facility] ENDOSCOPY;  Service: Endoscopy;  Laterality: N/A;   ESOPHAGOGASTRODUODENOSCOPY (EGD) WITH PROPOFOL N/A 01/27/2022   Procedure: ESOPHAGOGASTRODUODENOSCOPY (EGD) WITH PROPOFOL;  Surgeon: [name], MD;  Location: [facility] ENDOSCOPY;  Service: Gastroenterology;  Laterality: N/A;   GIVENS CAPSULE STUDY N/A 03/10/2021   Procedure: GIVENS CAPSULE STUDY;  Surgeon: Pasty Spillers, MD;  Location: [facility] ENDOSCOPY;  Service: Endoscopy;  Laterality: N/A;   MEDIAL PARTIAL KNEE REPLACEMENT Left 1990   torn ligament. no knee replacement at that time   PARTIAL HYSTERECTOMY     SHOULDER ARTHROSCOPY WITH OPEN ROTATOR CUFF REPAIR Left 03/01/2016   Procedure: SHOULDER ARTHROSCOPY WITH OPEN ROTATOR CUFF REPAIR;  Surgeon: Juanell Fairly, MD;  Location: [facility] ORS;  Service: Orthopedics;  Laterality: Left;   TOTAL KNEE ARTHROPLASTY Left 02/14/2018   Procedure: TOTAL KNEE ARTHROPLASTY;  Surgeon: Juanell Fairly, MD;  Location: [facility] ORS;  Service: Orthopedics;  Laterality: Left;    FAMILY HISTORY: Family History  Problem Relation Age of Onset   CAD Mother    CAD Sister    Breast cancer Neg Hx     ADVANCED DIRECTIVES (Y/N):  N  HEALTH  MAINTENANCE: Social History   Tobacco Use   Smoking status: Some Days    Current packs/day: 0.00    Average packs/day: 0.3 packs/day for 50.0 years (12.5 ttl pk-yrs)    Types: Cigarettes    Start date: 03/13/1966    Last attempt to quit: 03/13/2016    Years since quitting: 7.2   Smokeless tobacco: Never  Vaping Use   Vaping status: Never Used  Substance Use Topics   Alcohol use: No   Drug use: No     Colonoscopy:  PAP:  Bone density:  Lipid panel:  Allergies  Allergen Reactions   Latex Rash    Welts over body    Current Outpatient Medications  Medication Sig Dispense Refill   albuterol (VENTOLIN HFA) 108 (90 Base) MCG/ACT inhaler Inhale 2 puffs into the lungs every 6 (six) hours as needed for wheezing.     amitriptyline (ELAVIL) 25 MG tablet Take 25 mg by mouth at bedtime.     budesonide-formoterol (SYMBICORT) 160-4.5 MCG/ACT inhaler Inhale 2 puffs into the lungs 2 (two) times daily.     furosemide (LASIX) 20 MG tablet Take 20 mg daily by mouth.      lisinopril (PRINIVIL,ZESTRIL) 20 MG tablet Take 20 mg by mouth daily.     ondansetron (ZOFRAN-ODT) 4 MG disintegrating tablet Take 1 tablet (4 mg total) by mouth every 8 (eight) hours as needed for nausea or vomiting. 20 tablet 0  oxyCODONE-acetaminophen (PERCOCET) 5-325 MG tablet Take 1-2 tablets by mouth every 6 (six) hours as needed for moderate pain (pain score 4-6) or severe pain (pain score 7-10) (no more than 6 tabs daily). 20 tablet 0   potassium chloride (KLOR-CON M) 10 MEQ tablet Take 10 mEq by mouth daily.     rOPINIRole (REQUIP) 2 MG tablet Take 2-4 mg by mouth at bedtime as needed (restless leg syndrome).     simvastatin (ZOCOR) 20 MG tablet Take 20 mg by mouth at bedtime.     SUMAtriptan 6 MG/0.5ML SOAJ Inject into the skin.     vitamin B-12 (CYANOCOBALAMIN) 1000 MCG tablet Take 1,000 mcg by mouth daily.     No current facility-administered medications for this visit.   Facility-Administered Medications Ordered  in Other Visits  Medication Dose Route Frequency Provider Last Rate Last Admin   iron sucrose (VENOFER) injection 200 mg  200 mg Intravenous Once Jeralyn Ruths, MD       octreotide (SANDOSTATIN LAR) IM injection 30 mg  30 mg Intramuscular Once Jeralyn Ruths, MD       sodium chloride flush (NS) 0.9 % injection 10 mL  10 mL Intracatheter Once PRN Jeralyn Ruths, MD        OBJECTIVE: Vitals:   06/21/23 1404  BP: (!) 140/76  Pulse: (!) 107  Temp: 98.9 F (37.2 C)  SpO2: 100%      Body mass index is 27.69 kg/m.    ECOG FS:1 - Symptomatic but completely ambulatory  General: Well-developed, well-nourished, no acute distress. Eyes: Pink conjunctiva, anicteric sclera. HEENT: Normocephalic, moist mucous membranes. Lungs: No audible wheezing or coughing. Heart: Regular rate and rhythm. Abdomen: Soft, nontender, no obvious distention. Musculoskeletal: No edema, cyanosis, or clubbing. Neuro: Alert, answering all questions appropriately. Cranial nerves grossly intact. Skin: No rashes or petechiae noted. Psych: Normal affect.  LAB RESULTS:  Lab Results  Component Value Date   NA 140 06/13/2023   K 3.4 (L) 06/13/2023   CL 104 06/13/2023   CO2 27 06/13/2023   GLUCOSE 111 (H) 06/13/2023   BUN 24 (H) 06/13/2023   CREATININE 0.84 06/13/2023   CALCIUM 8.5 (L) 06/13/2023   PROT 5.7 (L) 06/13/2023   ALBUMIN 3.3 (L) 06/13/2023   AST 17 06/13/2023   ALT 13 06/13/2023   ALKPHOS 28 (L) 06/13/2023   BILITOT 0.4 06/13/2023   GFRNONAA >60 06/13/2023   GFRAA >60 02/16/2018    Lab Results  Component Value Date   WBC 4.6 06/21/2023   NEUTROABS 2.9 06/21/2023   HGB 6.7 (LL) 06/21/2023   HCT 22.1 (L) 06/21/2023   MCV 74.2 (L) 06/21/2023   PLT 432 (H) 06/21/2023   Lab Results  Component Value Date   IRON 21 (L) 04/17/2023   TIBC 427 04/17/2023   IRONPCTSAT 5 (L) 04/17/2023   Lab Results  Component Value Date   FERRITIN 24 04/17/2023     STUDIES: DG Humerus  Right Result Date: 06/02/2023 CLINICAL DATA:  Right upper arm pain and swelling for 2 days after possible injury. EXAM: RIGHT HUMERUS - 2+ VIEW COMPARISON:  None Available. FINDINGS: There is no evidence of fracture or other focal bone lesions. Soft tissues are unremarkable. IMPRESSION: Negative. Electronically Signed   By: Lupita Raider M.D.   On: 06/02/2023 09:00   DG Shoulder Right Result Date: 06/02/2023 CLINICAL DATA:  Right upper arm pain and swelling for 2 days after possible injury. EXAM: RIGHT SHOULDER - 2+ VIEW COMPARISON:  None Available. FINDINGS: There is no evidence of fracture or dislocation. Mild degenerative changes seen involving the right glenohumeral and acromioclavicular joints. Soft tissues are unremarkable. IMPRESSION: Degenerative changes as noted above.  No acute abnormality seen. Electronically Signed   By: Lupita Raider M.D.   On: 06/02/2023 08:58     ASSESSMENT: Iron deficiency anemia.  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  Argon plasma was used for hemostasis.  Patient's hemoglobin continues to trend down and is now 6.7.  This is likely secondary to ongoing GI bleed and patient has an appointment with [facility] GI tomorrow.  Since we are unable to give blood transfusion tomorrow because of her GI appointment patient will instead receive octreotide and Venofer today.  Return to clinic on Tuesday for repeat laboratory work and further evaluation with plans of 2 units of blood on Wednesday.   History of AVMs: Octreotide as above.  Follow-up with [facility] GI tomorrow.  I spent a total of 30 minutes reviewing chart data, face-to-face evaluation with the patient, counseling and coordination of care as detailed above.   Patient expressed understanding and was in agreement with this plan. She also understands that She can call clinic at any time with any questions, concerns, or  complaints.    Jeralyn Ruths, MD   06/21/2023 2:46 PM

## 2023-06-22 ENCOUNTER — Ambulatory Visit: Payer: 59

## 2023-06-26 ENCOUNTER — Inpatient Hospital Stay (HOSPITAL_BASED_OUTPATIENT_CLINIC_OR_DEPARTMENT_OTHER): Payer: 59 | Admitting: Oncology

## 2023-06-26 ENCOUNTER — Encounter: Payer: Self-pay | Admitting: Oncology

## 2023-06-26 ENCOUNTER — Inpatient Hospital Stay: Payer: 59

## 2023-06-26 VITALS — BP 142/82 | HR 90 | Resp 16 | Ht 64.0 in | Wt 158.0 lb

## 2023-06-26 DIAGNOSIS — D509 Iron deficiency anemia, unspecified: Secondary | ICD-10-CM

## 2023-06-26 LAB — CBC WITH DIFFERENTIAL/PLATELET
Abs Immature Granulocytes: 0.01 10*3/uL (ref 0.00–0.07)
Basophils Absolute: 0 10*3/uL (ref 0.0–0.1)
Basophils Relative: 1 %
Eosinophils Absolute: 0.1 10*3/uL (ref 0.0–0.5)
Eosinophils Relative: 2 %
HCT: 25.4 % — ABNORMAL LOW (ref 36.0–46.0)
Hemoglobin: 7.5 g/dL — ABNORMAL LOW (ref 12.0–15.0)
Immature Granulocytes: 0 %
Lymphocytes Relative: 29 %
Lymphs Abs: 1 10*3/uL (ref 0.7–4.0)
MCH: 22.9 pg — ABNORMAL LOW (ref 26.0–34.0)
MCHC: 29.5 g/dL — ABNORMAL LOW (ref 30.0–36.0)
MCV: 77.4 fL — ABNORMAL LOW (ref 80.0–100.0)
Monocytes Absolute: 0.4 10*3/uL (ref 0.1–1.0)
Monocytes Relative: 12 %
Neutro Abs: 2 10*3/uL (ref 1.7–7.7)
Neutrophils Relative %: 56 %
Platelets: 408 10*3/uL — ABNORMAL HIGH (ref 150–400)
RBC: 3.28 MIL/uL — ABNORMAL LOW (ref 3.87–5.11)
RDW: 24.9 % — ABNORMAL HIGH (ref 11.5–15.5)
WBC: 3.5 10*3/uL — ABNORMAL LOW (ref 4.0–10.5)
nRBC: 0 % (ref 0.0–0.2)

## 2023-06-26 LAB — PREPARE RBC (CROSSMATCH)

## 2023-06-26 NOTE — Progress Notes (Signed)
[facility]  Telephone:([phone]) [phone] Fax:(336) [phone]  ID: Hailey Farrell OB: 09/29/1950  MR#: [id number]  XBM#:[id number]  Patient Care Team: Oswaldo Conroy, MD as PCP - General (Family Medicine) Jeralyn Ruths, MD as Consulting Physician (Oncology)  CHIEF COMPLAINT: Iron deficiency anemia  INTERVAL HISTORY: Patient returns to clinic today for repeat laboratory, further evaluation, and consideration of additional blood.  Her weakness and fatigue have improved, but she is not back to her baseline.  She recently tore a tendon in her right arm and likely will need surgical repair, but does not know when this will occur.  She continues to have dark stools.  She has no neurologic complaints. She denies any recent fevers or illnesses.  She has a fair appetite.  She denies any chest pain, shortness of breath, cough, or hemoptysis.  She denies any nausea, vomiting, constipation, or diarrhea.  She denies any hematochezia.  She has no urinary complaints.  Patient offers no further specific complaints today.  REVIEW OF SYSTEMS:   Review of Systems  Constitutional:  Positive for malaise/fatigue. Negative for fever and weight loss.  Respiratory: Negative.  Negative for cough and shortness of breath.   Cardiovascular: Negative.  Negative for chest pain and leg swelling.  Gastrointestinal:  Positive for melena. Negative for abdominal pain and blood in stool.  Genitourinary: Negative.  Negative for hematuria.  Musculoskeletal: Negative.  Negative for back pain.  Skin: Negative.  Negative for rash.  Neurological:  Positive for weakness. Negative for dizziness, focal weakness and headaches.  Psychiatric/Behavioral: Negative.  The patient is not nervous/anxious.     As per HPI. Otherwise, a complete review of systems is negative.  PAST MEDICAL HISTORY: Past Medical History:  Diagnosis Date   Anemia    Arthritis    Breast cancer (HCC) 1990   Right   COPD (chronic  obstructive pulmonary disease) (HCC)    Dyspnea    DOE   Elevated lipids    Hypertension    Lower extremity edema    Migraines    Migraines    Personal history of radiation therapy 1990   Breast   Restless leg syndrome    Rotator cuff injury     PAST SURGICAL HISTORY: Past Surgical History:  Procedure Laterality Date   ABDOMINAL HYSTERECTOMY     BACK SURGERY  08/2017   CERVICAL FUSION   BREAST BIOPSY Right 1990   LUMPECTOMY.  took lymph nodes   BREAST LUMPECTOMY     COLONOSCOPY WITH PROPOFOL N/A 07/20/2017   Procedure: COLONOSCOPY WITH PROPOFOL;  Surgeon: Pasty Spillers, MD;  Location: [facility] ENDOSCOPY;  Service: Endoscopy;  Laterality: N/A;   COLONOSCOPY WITH PROPOFOL N/A 01/27/2022   Procedure: COLONOSCOPY WITH PROPOFOL;  Surgeon: [name], MD;  Location: [facility] ENDOSCOPY;  Service: Gastroenterology;  Laterality: N/A;   ENTEROSCOPY N/A 08/08/2017   Procedure: ENTEROSCOPY;  Surgeon: Toney Reil, MD;  Location: [facility] ENDOSCOPY;  Service: Gastroenterology;  Laterality: N/A;   ENTEROSCOPY N/A 05/07/2020   Procedure: ENTEROSCOPY with Adult colonoscope;  Surgeon: Pasty Spillers, MD;  Location: [facility] ENDOSCOPY;  Service: Endoscopy;  Laterality: N/A;   ENTEROSCOPY N/A 03/17/2021   Procedure: ENTEROSCOPY;  Surgeon: Pasty Spillers, MD;  Location: [facility] ENDOSCOPY;  Service: Endoscopy;  Laterality: N/A;  PUSH   ENTEROSCOPY N/A 07/13/2021   Procedure: ENTEROSCOPY;  Surgeon: Toney Reil, MD;  Location: [facility] ENDOSCOPY;  Service: Gastroenterology;  Laterality: N/A;   ENTEROSCOPY N/A 10/26/2021   Procedure: ENTEROSCOPY;  Surgeon:  Toney Reil, MD;  Location: [facility] ENDOSCOPY;  Service: Gastroenterology;  Laterality: N/A;  push   ENTEROSCOPY N/A 07/27/2022   Procedure: ENTEROSCOPY;  Surgeon: Midge Minium, MD;  Location: [facility] ENDOSCOPY;  Service: Endoscopy;  Laterality: N/A;  Push   ESOPHAGOGASTRODUODENOSCOPY Left 04/13/2017   Procedure: ESOPHAGOGASTRODUODENOSCOPY  (EGD);  Surgeon: Pasty Spillers, MD;  Location: [facility] ENDOSCOPY;  Service: Endoscopy;  Laterality: Left;   ESOPHAGOGASTRODUODENOSCOPY (EGD) WITH PROPOFOL N/A 07/20/2017   Procedure: ESOPHAGOGASTRODUODENOSCOPY (EGD) WITH PROPOFOL;  Surgeon: Pasty Spillers, MD;  Location: [facility] ENDOSCOPY;  Service: Endoscopy;  Laterality: N/A;   ESOPHAGOGASTRODUODENOSCOPY (EGD) WITH PROPOFOL N/A 12/12/2017   Procedure: ESOPHAGOGASTRODUODENOSCOPY (EGD) WITH PROPOFOL;  Surgeon: Pasty Spillers, MD;  Location: [facility] ENDOSCOPY;  Service: Endoscopy;  Laterality: N/A;   ESOPHAGOGASTRODUODENOSCOPY (EGD) WITH PROPOFOL N/A 12/23/2020   Procedure: ESOPHAGOGASTRODUODENOSCOPY (EGD) WITH PROPOFOL;  Surgeon: Pasty Spillers, MD;  Location: [facility] ENDOSCOPY;  Service: Endoscopy;  Laterality: N/A;   ESOPHAGOGASTRODUODENOSCOPY (EGD) WITH PROPOFOL N/A 01/27/2022   Procedure: ESOPHAGOGASTRODUODENOSCOPY (EGD) WITH PROPOFOL;  Surgeon: [name], MD;  Location: [facility] ENDOSCOPY;  Service: Gastroenterology;  Laterality: N/A;   GIVENS CAPSULE STUDY N/A 03/10/2021   Procedure: GIVENS CAPSULE STUDY;  Surgeon: Pasty Spillers, MD;  Location: [facility] ENDOSCOPY;  Service: Endoscopy;  Laterality: N/A;   MEDIAL PARTIAL KNEE REPLACEMENT Left 1990   torn ligament. no knee replacement at that time   PARTIAL HYSTERECTOMY     SHOULDER ARTHROSCOPY WITH OPEN ROTATOR CUFF REPAIR Left 03/01/2016   Procedure: SHOULDER ARTHROSCOPY WITH OPEN ROTATOR CUFF REPAIR;  Surgeon: Juanell Fairly, MD;  Location: [facility] ORS;  Service: Orthopedics;  Laterality: Left;   TOTAL KNEE ARTHROPLASTY Left 02/14/2018   Procedure: TOTAL KNEE ARTHROPLASTY;  Surgeon: Juanell Fairly, MD;  Location: [facility] ORS;  Service: Orthopedics;  Laterality: Left;    FAMILY HISTORY: Family History  Problem Relation Age of Onset   CAD Mother    CAD Sister    Breast cancer Neg Hx     ADVANCED DIRECTIVES (Y/N):  N  HEALTH MAINTENANCE: Social History   Tobacco Use    Smoking status: Some Days    Current packs/day: 0.00    Average packs/day: 0.3 packs/day for 50.0 years (12.5 ttl pk-yrs)    Types: Cigarettes    Start date: 03/13/1966    Last attempt to quit: 03/13/2016    Years since quitting: 7.2   Smokeless tobacco: Never  Vaping Use   Vaping status: Never Used  Substance Use Topics   Alcohol use: No   Drug use: No     Colonoscopy:  PAP:  Bone density:  Lipid panel:  Allergies  Allergen Reactions   Latex Rash    Welts over body    Current Outpatient Medications  Medication Sig Dispense Refill   albuterol (VENTOLIN HFA) 108 (90 Base) MCG/ACT inhaler Inhale 2 puffs into the lungs every 6 (six) hours as needed for wheezing.     amitriptyline (ELAVIL) 25 MG tablet Take 25 mg by mouth at bedtime.     budesonide-formoterol (SYMBICORT) 160-4.5 MCG/ACT inhaler Inhale 2 puffs into the lungs 2 (two) times daily.     furosemide (LASIX) 20 MG tablet Take 20 mg daily by mouth.      lisinopril (PRINIVIL,ZESTRIL) 20 MG tablet Take 20 mg by mouth daily.     ondansetron (ZOFRAN-ODT) 4 MG disintegrating tablet Take 1 tablet (4 mg total) by mouth every 8 (eight) hours as needed for nausea or vomiting. 20 tablet 0   potassium chloride (  KLOR-CON M) 10 MEQ tablet Take 10 mEq by mouth daily.     rOPINIRole (REQUIP) 2 MG tablet Take 2-4 mg by mouth at bedtime as needed (restless leg syndrome).     simvastatin (ZOCOR) 20 MG tablet Take 20 mg by mouth at bedtime.     SUMAtriptan 6 MG/0.5ML SOAJ Inject into the skin.     vitamin B-12 (CYANOCOBALAMIN) 1000 MCG tablet Take 1,000 mcg by mouth daily.     oxyCODONE-acetaminophen (PERCOCET) 5-325 MG tablet Take 1-2 tablets by mouth every 6 (six) hours as needed for moderate pain (pain score 4-6) or severe pain (pain score 7-10) (no more than 6 tabs daily). (Patient not taking: Reported on 06/26/2023) 20 tablet 0   No current facility-administered medications for this visit.    OBJECTIVE: Vitals:   06/26/23 0911  BP:  (!) 142/82  Pulse: 90  Resp: 16  SpO2: 100%      Body mass index is 27.12 kg/m.    ECOG FS:1 - Symptomatic but completely ambulatory  General: Well-developed, well-nourished, no acute distress. Eyes: Pink conjunctiva, anicteric sclera. HEENT: Normocephalic, moist mucous membranes. Lungs: No audible wheezing or coughing. Heart: Regular rate and rhythm. Abdomen: Soft, nontender, no obvious distention. Musculoskeletal: No edema, cyanosis, or clubbing. Neuro: Alert, answering all questions appropriately. Cranial nerves grossly intact. Skin: No rashes or petechiae noted. Psych: Normal affect.  LAB RESULTS:  Lab Results  Component Value Date   NA 140 06/13/2023   K 3.4 (L) 06/13/2023   CL 104 06/13/2023   CO2 27 06/13/2023   GLUCOSE 111 (H) 06/13/2023   BUN 24 (H) 06/13/2023   CREATININE 0.84 06/13/2023   CALCIUM 8.5 (L) 06/13/2023   PROT 5.7 (L) 06/13/2023   ALBUMIN 3.3 (L) 06/13/2023   AST 17 06/13/2023   ALT 13 06/13/2023   ALKPHOS 28 (L) 06/13/2023   BILITOT 0.4 06/13/2023   GFRNONAA >60 06/13/2023   GFRAA >60 02/16/2018    Lab Results  Component Value Date   WBC 3.5 (L) 06/26/2023   NEUTROABS 2.0 06/26/2023   HGB 7.5 (L) 06/26/2023   HCT 25.4 (L) 06/26/2023   MCV 77.4 (L) 06/26/2023   PLT 408 (H) 06/26/2023   Lab Results  Component Value Date   IRON 21 (L) 04/17/2023   TIBC 427 04/17/2023   IRONPCTSAT 5 (L) 04/17/2023   Lab Results  Component Value Date   FERRITIN 24 04/17/2023     STUDIES: DG Humerus Right Result Date: 06/02/2023 CLINICAL DATA:  Right upper arm pain and swelling for 2 days after possible injury. EXAM: RIGHT HUMERUS - 2+ VIEW COMPARISON:  None Available. FINDINGS: There is no evidence of fracture or other focal bone lesions. Soft tissues are unremarkable. IMPRESSION: Negative. Electronically Signed   By: Lupita Raider M.D.   On: 06/02/2023 09:00   DG Shoulder Right Result Date: 06/02/2023 CLINICAL DATA:  Right upper arm pain and  swelling for 2 days after possible injury. EXAM: RIGHT SHOULDER - 2+ VIEW COMPARISON:  None Available. FINDINGS: There is no evidence of fracture or dislocation. Mild degenerative changes seen involving the right glenohumeral and acromioclavicular joints. Soft tissues are unremarkable. IMPRESSION: Degenerative changes as noted above.  No acute abnormality seen. Electronically Signed   By: Lupita Raider M.D.   On: 06/02/2023 08:58     ASSESSMENT: Iron deficiency anemia.  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  Argon plasma was used for hemostasis.  Although patient's hemoglobin improved to 7.5, this was not a significant jump from 6.7 after receiving 2 units of packed red blood cells.  She missed her GI appointment at [facility] recently secondary to getting lost and this has been rescheduled for June 28, 2023.  Return to clinic tomorrow for 2 units of packed red blood cells.  Patient will then return to clinic in 2 weeks for further evaluation and consideration of blood.   History of AVMs: Patient last received Octreotide on June 21, 2023.  Continue monthly injections.  Follow-up with [facility] as above.  I spent a total of 30 minutes reviewing chart data, face-to-face evaluation with the patient, counseling and coordination of care as detailed above.   Patient expressed understanding and was in agreement with this plan. She also understands that She can call clinic at any time with any questions, concerns, or complaints.    Jeralyn Ruths, MD   06/26/2023 9:47 AM

## 2023-06-27 ENCOUNTER — Inpatient Hospital Stay: Payer: 59

## 2023-06-27 DIAGNOSIS — D509 Iron deficiency anemia, unspecified: Secondary | ICD-10-CM

## 2023-06-27 MED ORDER — DIPHENHYDRAMINE HCL 50 MG/ML IJ SOLN
25.0000 mg | Freq: Once | INTRAMUSCULAR | Status: AC
  Administered 2023-06-27: 25 mg via INTRAVENOUS
  Filled 2023-06-27: qty 1

## 2023-06-27 MED ORDER — SODIUM CHLORIDE 0.9% IV SOLUTION
250.0000 mL | INTRAVENOUS | Status: DC
  Administered 2023-06-27: 100 mL via INTRAVENOUS
  Filled 2023-06-27: qty 250

## 2023-06-27 MED ORDER — ACETAMINOPHEN 325 MG PO TABS
650.0000 mg | ORAL_TABLET | Freq: Once | ORAL | Status: AC
  Administered 2023-06-27: 650 mg via ORAL
  Filled 2023-06-27: qty 2

## 2023-06-27 NOTE — Patient Instructions (Signed)

## 2023-06-28 ENCOUNTER — Other Ambulatory Visit: Payer: Self-pay

## 2023-06-28 ENCOUNTER — Encounter: Payer: Self-pay | Admitting: Oncology

## 2023-06-28 ENCOUNTER — Telehealth: Payer: Self-pay

## 2023-06-28 ENCOUNTER — Other Ambulatory Visit
Admission: RE | Admit: 2023-06-28 | Discharge: 2023-06-28 | Disposition: A | Discharge status: Home / Self Care | Payer: 59 | Source: Ambulatory Visit | Attending: Gastroenterology | Admitting: Gastroenterology

## 2023-06-28 DIAGNOSIS — D509 Iron deficiency anemia, unspecified: Secondary | ICD-10-CM | POA: Diagnosis present

## 2023-06-28 DIAGNOSIS — Q2730 Arteriovenous malformation, site unspecified: Secondary | ICD-10-CM

## 2023-06-28 DIAGNOSIS — D500 Iron deficiency anemia secondary to blood loss (chronic): Secondary | ICD-10-CM

## 2023-06-28 DIAGNOSIS — K921 Melena: Secondary | ICD-10-CM

## 2023-06-28 LAB — TYPE AND SCREEN
ABO/RH(D): O POS
Antibody Screen: NEGATIVE
Unit division: 0
Unit division: 0

## 2023-06-28 LAB — CBC WITH DIFFERENTIAL/PLATELET
Abs Immature Granulocytes: 0.01 10*3/uL (ref 0.00–0.07)
Basophils Absolute: 0 10*3/uL (ref 0.0–0.1)
Basophils Relative: 1 %
Eosinophils Absolute: 0.1 10*3/uL (ref 0.0–0.5)
Eosinophils Relative: 1 %
HCT: 33.2 % — ABNORMAL LOW (ref 36.0–46.0)
Hemoglobin: 10.3 g/dL — ABNORMAL LOW (ref 12.0–15.0)
Immature Granulocytes: 0 %
Lymphocytes Relative: 22 %
Lymphs Abs: 1.4 10*3/uL (ref 0.7–4.0)
MCH: 25 pg — ABNORMAL LOW (ref 26.0–34.0)
MCHC: 31 g/dL (ref 30.0–36.0)
MCV: 80.6 fL (ref 80.0–100.0)
Monocytes Absolute: 0.7 10*3/uL (ref 0.1–1.0)
Monocytes Relative: 11 %
Neutro Abs: 4.1 10*3/uL (ref 1.7–7.7)
Neutrophils Relative %: 65 %
Platelets: 415 10*3/uL — ABNORMAL HIGH (ref 150–400)
RBC: 4.12 MIL/uL (ref 3.87–5.11)
RDW: 23.7 % — ABNORMAL HIGH (ref 11.5–15.5)
Smear Review: NORMAL
WBC: 6.4 10*3/uL (ref 4.0–10.5)
nRBC: 0 % (ref 0.0–0.2)

## 2023-06-28 LAB — BPAM RBC
Blood Product Expiration Date: 202502112359
Blood Product Unit Number: 202502112359
ISSUE DATE / TIME: 202501220924
PRODUCT CODE: 202501221113
PRODUCT CODE: 202502112359
Unit Type and Rh: 5100
Unit Type and Rh: 202502112359
Unit Type and Rh: 5100
Unit Type and Rh: 5100

## 2023-06-28 LAB — SAMPLE TO BLOOD BANK

## 2023-06-28 NOTE — Telephone Encounter (Signed)
Per Dr. Allegra Lai patient needs to get a STAT Hemoglobin today and based on that reading we will decide if she can have a Push enteroscopy tomorrow or if she needs to be admitted. Called patient and patient husband Peyton Najjar and left a message for call back

## 2023-06-28 NOTE — Telephone Encounter (Signed)
She will need push enteroscopy ASAP.  I discussed her case with [facility] GI fellow who saw her in the office today.  She is having ongoing melena, received blood transfusion yesterday Dr. Milinda Cave office.  I tried to reach patient's number as well as her husband's number, went to voicemail   RV

## 2023-06-28 NOTE — Telephone Encounter (Signed)
[facility] provider Geralyn Flash states she saw patient today in the office for a hospital follow up. She states that patient would rather to be seen locally by Dr. Allegra Lai and she wants to make sure that we are okay with that. She states she was admitted to the ER because she was having Melena from AVM that cleared up at the end of the hospitalization. They did a Push enteroscopy and a capsule study. She states they do not do do double balloon enteroscopy that is done by [facility] if you feel like she needs this. They can only do single balloon enteroscopy.  She wants to make sure the patient is not loss in the transfer back to [place]. They did start her back on in the hospital on the Octreotide injections that is prescribed by Dr. Orlie Dakin.  She has a appointment with you on 08/09/2023. Do you want to see her sooner or anything done before then.   Provider cell number [phone] Nurse number [phone]

## 2023-06-28 NOTE — Telephone Encounter (Addendum)
Called and left a message for call back and left a message for husband for call back

## 2023-06-28 NOTE — Telephone Encounter (Signed)
Phone rang one time and then went to back and left a message for call back.  Patient called back and she states her husband has new cell phone number updated that in the chart. She states her phone does not work half the time. Explained what Dr. Allegra Lai wanted her to have done. She states why does she need to have lab work she just had 2 units of blood yesterday. Informed her for [place] to do the procedure we had to check her lab work. [facility] done. Order them as stat

## 2023-06-28 NOTE — Addendum Note (Signed)
Addended by: Radene Knee L on: 06/28/2023 03:36 PM   Modules accepted: Orders

## 2023-06-28 NOTE — Telephone Encounter (Signed)
The patient called back to speak with Dr. Allegra Lai or Morrie Sheldon she said someone call her.

## 2023-06-28 NOTE — Telephone Encounter (Signed)
Dr. Allegra Lai states to put her on the schedule tomorrow for the Push enteroscopy and if hemoglobin is to low we will cancel  order the Push enteroscopy and called endo to find out what time she needed to arrive  10:45am. Called patient and informed patient of this information and she verbalized understanding. Informed her if it is to low we will call her and let her know

## 2023-06-29 ENCOUNTER — Encounter
Admission: RE | Disposition: A | Discharge status: Home / Self Care | Payer: Self-pay | Source: Home / Self Care | Attending: Gastroenterology

## 2023-06-29 ENCOUNTER — Ambulatory Visit
Admission: RE | Admit: 2023-06-29 | Discharge: 2023-06-29 | Disposition: A | Discharge status: Home / Self Care | Payer: 59 | Attending: Gastroenterology | Admitting: Gastroenterology

## 2023-06-29 ENCOUNTER — Ambulatory Visit: Payer: 59 | Admitting: Certified Registered"

## 2023-06-29 ENCOUNTER — Encounter: Payer: Self-pay | Admitting: Gastroenterology

## 2023-06-29 DIAGNOSIS — Z923 Personal history of irradiation: Secondary | ICD-10-CM | POA: Insufficient documentation

## 2023-06-29 DIAGNOSIS — K31811 Angiodysplasia of stomach and duodenum with bleeding: Secondary | ICD-10-CM | POA: Diagnosis not present

## 2023-06-29 DIAGNOSIS — Z853 Personal history of malignant neoplasm of breast: Secondary | ICD-10-CM | POA: Insufficient documentation

## 2023-06-29 DIAGNOSIS — J449 Chronic obstructive pulmonary disease, unspecified: Secondary | ICD-10-CM | POA: Insufficient documentation

## 2023-06-29 DIAGNOSIS — F17210 Nicotine dependence, cigarettes, uncomplicated: Secondary | ICD-10-CM | POA: Insufficient documentation

## 2023-06-29 DIAGNOSIS — I10 Essential (primary) hypertension: Secondary | ICD-10-CM | POA: Diagnosis not present

## 2023-06-29 DIAGNOSIS — E66813 Obesity, class 3: Secondary | ICD-10-CM | POA: Insufficient documentation

## 2023-06-29 DIAGNOSIS — D500 Iron deficiency anemia secondary to blood loss (chronic): Secondary | ICD-10-CM | POA: Insufficient documentation

## 2023-06-29 DIAGNOSIS — Z7951 Long term (current) use of inhaled steroids: Secondary | ICD-10-CM | POA: Diagnosis not present

## 2023-06-29 DIAGNOSIS — Q2730 Arteriovenous malformation, site unspecified: Secondary | ICD-10-CM

## 2023-06-29 DIAGNOSIS — Z6827 Body mass index (BMI) 27.0-27.9, adult: Secondary | ICD-10-CM | POA: Insufficient documentation

## 2023-06-29 HISTORY — PX: HOT HEMOSTASIS: SHX5433

## 2023-06-29 HISTORY — PX: ENTEROSCOPY: SHX5533

## 2023-06-29 SURGERY — ENTEROSCOPY
Anesthesia: General

## 2023-06-29 MED ORDER — PROPOFOL 10 MG/ML IV BOLUS
INTRAVENOUS | Status: AC
  Filled 2023-06-29: qty 20

## 2023-06-29 MED ORDER — OMEPRAZOLE MAGNESIUM 20 MG PO TBEC: 40.0000 mg | DELAYED_RELEASE_TABLET | Freq: Every day | ORAL | 1 refills | Status: DC

## 2023-06-29 MED ORDER — LIDOCAINE HCL (CARDIAC) PF 100 MG/5ML IV SOSY
PREFILLED_SYRINGE | INTRAVENOUS | Status: DC | PRN
  Administered 2023-06-29: 100 mg via INTRAVENOUS

## 2023-06-29 MED ORDER — PROPOFOL 500 MG/50ML IV EMUL
INTRAVENOUS | Status: DC | PRN
  Administered 2023-06-29: 100 ug/kg/min via INTRAVENOUS

## 2023-06-29 MED ORDER — PROPOFOL 10 MG/ML IV BOLUS
INTRAVENOUS | Status: DC | PRN
  Administered 2023-06-29: 80 mg via INTRAVENOUS
  Administered 2023-06-29: 20 mg via INTRAVENOUS

## 2023-06-29 MED ORDER — SODIUM CHLORIDE 0.9 % IV SOLN: INTRAVENOUS | Status: DC

## 2023-06-29 NOTE — Anesthesia Postprocedure Evaluation (Signed)
Anesthesia Post Note  Patient: Hailey Farrell  Procedure(s) Performed: ENTEROSCOPY HOT HEMOSTASIS (ARGON PLASMA COAGULATION/BICAP)  Patient location during evaluation: PACU Anesthesia Type: General Level of consciousness: awake and awake and alert Pain management: satisfactory to patient Vital Signs Assessment: post-procedure vital signs reviewed and stable Cardiovascular status: blood pressure returned to baseline Anesthetic complications: no   No notable events documented.   Last Vitals:  Vitals:   06/29/23 1251 06/29/23 1301  BP: 139/76 (!) 147/94  Pulse: 77 79  Resp: 20 20  Temp:    SpO2: 98% 97%    Last Pain:  Vitals:   06/29/23 1301  TempSrc:   PainSc: 0-No pain                 VAN STAVEREN,Shanyah Gattuso

## 2023-06-29 NOTE — Anesthesia Preprocedure Evaluation (Signed)
Anesthesia Evaluation  Patient identified by MRN, date of birth, ID band Patient awake    Reviewed: Allergy & Precautions, NPO status , Patient's Chart, lab work & pertinent test results  Airway Mallampati: II  TM Distance: >3 FB Neck ROM: full    Dental  (+) Partial Upper   Pulmonary neg pulmonary ROS, shortness of breath, COPD,  COPD inhaler, Current Smoker and Patient abstained from smoking.   Pulmonary exam normal  + decreased breath sounds      Cardiovascular Exercise Tolerance: Good hypertension, Pt. on medications + DOE  negative cardio ROS Normal cardiovascular exam Rhythm:Regular Rate:Normal     Neuro/Psych  Headaches negative neurological ROS  negative psych ROS   GI/Hepatic negative GI ROS, Neg liver ROS, hiatal hernia,,,  Endo/Other  negative endocrine ROS  Class 3 obesity  Renal/GU negative Renal ROS  negative genitourinary   Musculoskeletal  (+) Arthritis ,    Abdominal  (+) + obese  Peds negative pediatric ROS (+)  Hematology negative hematology ROS (+) Blood dyscrasia, anemia   Anesthesia Other Findings Past Medical History: No date: Anemia No date: Arthritis 1990: Breast cancer (HCC)     Comment:  Right No date: COPD (chronic obstructive pulmonary disease) (HCC) No date: Dyspnea     Comment:  DOE No date: Elevated lipids No date: Hypertension No date: Lower extremity edema No date: Migraines No date: Migraines 1990: Personal history of radiation therapy     Comment:  Breast No date: Restless leg syndrome No date: Rotator cuff injury  Past Surgical History: No date: ABDOMINAL HYSTERECTOMY 08/2017: BACK SURGERY     Comment:  CERVICAL FUSION 1990: BREAST BIOPSY; Right     Comment:  LUMPECTOMY.  took lymph nodes No date: BREAST LUMPECTOMY 07/20/2017: COLONOSCOPY WITH PROPOFOL; N/A     Comment:  Procedure: COLONOSCOPY WITH PROPOFOL;  Surgeon:               Pasty Spillers, MD;   Location: [facility] ENDOSCOPY;                Service: Endoscopy;  Laterality: N/A; 01/27/2022: COLONOSCOPY WITH PROPOFOL; N/A     Comment:  Procedure: COLONOSCOPY WITH PROPOFOL;  Surgeon: [name], MD;  Location: [facility] ENDOSCOPY;  Service:               Gastroenterology;  Laterality: N/A; 08/08/2017: ENTEROSCOPY; N/A     Comment:  Procedure: ENTEROSCOPY;  Surgeon: Toney Reil,               MD;  Location: [facility] ENDOSCOPY;  Service:               Gastroenterology;  Laterality: N/A; 05/07/2020: ENTEROSCOPY; N/A     Comment:  Procedure: ENTEROSCOPY with Adult colonoscope;  Surgeon:              Pasty Spillers, MD;  Location: [facility] ENDOSCOPY;                Service: Endoscopy;  Laterality: N/A; 03/17/2021: ENTEROSCOPY; N/A     Comment:  Procedure: ENTEROSCOPY;  Surgeon: Pasty Spillers,               MD;  Location: [facility] ENDOSCOPY;  Service: Endoscopy;                Laterality: N/A;  PUSH 07/13/2021: ENTEROSCOPY; N/A     Comment:  Procedure: ENTEROSCOPY;  Surgeon: Toney Reil,               MD;  Location: [facility] ENDOSCOPY;  Service:               Gastroenterology;  Laterality: N/A; 10/26/2021: ENTEROSCOPY; N/A     Comment:  Procedure: ENTEROSCOPY;  Surgeon: Toney Reil,               MD;  Location: [facility] ENDOSCOPY;  Service:               Gastroenterology;  Laterality: N/A;  push 07/27/2022: ENTEROSCOPY; N/A     Comment:  Procedure: ENTEROSCOPY;  Surgeon: Midge Minium, MD;                Location: [facility] ENDOSCOPY;  Service: Endoscopy;                Laterality: N/A;  Push 04/13/2017: ESOPHAGOGASTRODUODENOSCOPY; Left     Comment:  Procedure: ESOPHAGOGASTRODUODENOSCOPY (EGD);  Surgeon:               Pasty Spillers, MD;  Location: [facility] ENDOSCOPY;                Service: Endoscopy;  Laterality: Left; 07/20/2017: ESOPHAGOGASTRODUODENOSCOPY (EGD) WITH PROPOFOL; N/A     Comment:  Procedure: ESOPHAGOGASTRODUODENOSCOPY (EGD) WITH                PROPOFOL;  Surgeon: Pasty Spillers, MD;  Location:               [facility] ENDOSCOPY;  Service: Endoscopy;  Laterality: N/A; 12/12/2017: ESOPHAGOGASTRODUODENOSCOPY (EGD) WITH PROPOFOL; N/A     Comment:  Procedure: ESOPHAGOGASTRODUODENOSCOPY (EGD) WITH               PROPOFOL;  Surgeon: Pasty Spillers, MD;  Location:               [facility] ENDOSCOPY;  Service: Endoscopy;  Laterality: N/A; 12/23/2020: ESOPHAGOGASTRODUODENOSCOPY (EGD) WITH PROPOFOL; N/A     Comment:  Procedure: ESOPHAGOGASTRODUODENOSCOPY (EGD) WITH               PROPOFOL;  Surgeon: Pasty Spillers, MD;  Location:               [facility] ENDOSCOPY;  Service: Endoscopy;  Laterality: N/A; 01/27/2022: ESOPHAGOGASTRODUODENOSCOPY (EGD) WITH PROPOFOL; N/A     Comment:  Procedure: ESOPHAGOGASTRODUODENOSCOPY (EGD) WITH               PROPOFOL;  Surgeon: [name], MD;  Location: [facility]               ENDOSCOPY;  Service: Gastroenterology;  Laterality: N/A; 03/10/2021: GIVENS CAPSULE STUDY; N/A     Comment:  Procedure: GIVENS CAPSULE STUDY;  Surgeon: Pasty Spillers, MD;  Location: [facility] ENDOSCOPY;  Service:               Endoscopy;  Laterality: N/A; 1990: MEDIAL PARTIAL KNEE REPLACEMENT; Left     Comment:  torn ligament. no knee replacement at that time No date: PARTIAL HYSTERECTOMY 03/01/2016: SHOULDER ARTHROSCOPY WITH OPEN ROTATOR CUFF REPAIR; Left     Comment:  Procedure: SHOULDER ARTHROSCOPY WITH OPEN ROTATOR CUFF               REPAIR;  Surgeon: Juanell Fairly, MD;  Location: [facility]               ORS;  Service: Orthopedics;  Laterality: Left; 02/14/2018: TOTAL KNEE ARTHROPLASTY; Left     Comment:  Procedure: TOTAL KNEE ARTHROPLASTY;  Surgeon: Juanell Fairly, MD;  Location: [facility] ORS;  Service: Orthopedics;                Laterality: Left;  BMI    Body Mass Index: 27.17 kg/m      Reproductive/Obstetrics negative OB ROS                             Anesthesia  Physical Anesthesia Plan  ASA: 3  Anesthesia Plan: General   Post-op Pain Management:    Induction: Intravenous  PONV Risk Score and Plan: Propofol infusion and TIVA  Airway Management Planned: Natural Airway and Nasal Cannula  Additional Equipment:   Intra-op Plan:   Post-operative Plan:   Informed Consent: I have reviewed the patients History and Physical, chart, labs and discussed the procedure including the risks, benefits and alternatives for the proposed anesthesia with the patient or authorized representative who has indicated his/her understanding and acceptance.     Dental Advisory Given  Plan Discussed with: CRNA  Anesthesia Plan Comments:        Anesthesia Quick Evaluation

## 2023-06-29 NOTE — Anesthesia Procedure Notes (Signed)
Procedure Name: MAC Date/Time: 06/29/2023 12:12 PM  Performed by: Cheral Bay, CRNAPre-anesthesia Checklist: Patient identified, Emergency Drugs available, Suction available, Patient being monitored and Timeout performed Patient Re-evaluated:Patient Re-evaluated prior to induction Oxygen Delivery Method: Nasal cannula Induction Type: IV induction Placement Confirmation: positive ETCO2 and CO2 detector

## 2023-06-29 NOTE — H&P (Signed)
[name], MD [address], [place], [place], [address] [address], [address], [place], [place], [address] Phone: [phone]  Fax: [phone]  Primary Care Physician:  Oswaldo Conroy, MD   Pre-Procedure History & Physical: HPI:  Hailey Farrell is a 73 y.o. female is here for an endoscopy    Past Medical History:  Diagnosis Date   Anemia    Arthritis    Breast cancer (HCC) 1990   Right   COPD (chronic obstructive pulmonary disease) (HCC)    Dyspnea    DOE   Elevated lipids    Hypertension    Lower extremity edema    Migraines    Migraines    Personal history of radiation therapy 1990   Breast   Restless leg syndrome    Rotator cuff injury     Past Surgical History:  Procedure Laterality Date   ABDOMINAL HYSTERECTOMY     BACK SURGERY  08/2017   CERVICAL FUSION   BREAST BIOPSY Right 1990   LUMPECTOMY.  took lymph nodes   BREAST LUMPECTOMY     COLONOSCOPY WITH PROPOFOL N/A 07/20/2017   Procedure: COLONOSCOPY WITH PROPOFOL;  Surgeon: Pasty Spillers, MD;  Location: [facility] ENDOSCOPY;  Service: Endoscopy;  Laterality: N/A;   COLONOSCOPY WITH PROPOFOL N/A 01/27/2022   Procedure: COLONOSCOPY WITH PROPOFOL;  Surgeon: [name], MD;  Location: [facility] ENDOSCOPY;  Service: Gastroenterology;  Laterality: N/A;   ENTEROSCOPY N/A 08/08/2017   Procedure: ENTEROSCOPY;  Surgeon: Toney Reil, MD;  Location: [facility] ENDOSCOPY;  Service: Gastroenterology;  Laterality: N/A;   ENTEROSCOPY N/A 05/07/2020   Procedure: ENTEROSCOPY with Adult colonoscope;  Surgeon: Pasty Spillers, MD;  Location: [facility] ENDOSCOPY;  Service: Endoscopy;  Laterality: N/A;   ENTEROSCOPY N/A 03/17/2021   Procedure: ENTEROSCOPY;  Surgeon: Pasty Spillers, MD;  Location: [facility] ENDOSCOPY;  Service: Endoscopy;  Laterality: N/A;  PUSH   ENTEROSCOPY N/A 07/13/2021   Procedure: ENTEROSCOPY;  Surgeon: Toney Reil, MD;  Location: [facility] ENDOSCOPY;  Service: Gastroenterology;   Laterality: N/A;   ENTEROSCOPY N/A 10/26/2021   Procedure: ENTEROSCOPY;  Surgeon: Toney Reil, MD;  Location: [facility] ENDOSCOPY;  Service: Gastroenterology;  Laterality: N/A;  push   ENTEROSCOPY N/A 07/27/2022   Procedure: ENTEROSCOPY;  Surgeon: Midge Minium, MD;  Location: [facility] ENDOSCOPY;  Service: Endoscopy;  Laterality: N/A;  Push   ESOPHAGOGASTRODUODENOSCOPY Left 04/13/2017   Procedure: ESOPHAGOGASTRODUODENOSCOPY (EGD);  Surgeon: Pasty Spillers, MD;  Location: [facility] ENDOSCOPY;  Service: Endoscopy;  Laterality: Left;   ESOPHAGOGASTRODUODENOSCOPY (EGD) WITH PROPOFOL N/A 07/20/2017   Procedure: ESOPHAGOGASTRODUODENOSCOPY (EGD) WITH PROPOFOL;  Surgeon: Pasty Spillers, MD;  Location: [facility] ENDOSCOPY;  Service: Endoscopy;  Laterality: N/A;   ESOPHAGOGASTRODUODENOSCOPY (EGD) WITH PROPOFOL N/A 12/12/2017   Procedure: ESOPHAGOGASTRODUODENOSCOPY (EGD) WITH PROPOFOL;  Surgeon: Pasty Spillers, MD;  Location: [facility] ENDOSCOPY;  Service: Endoscopy;  Laterality: N/A;   ESOPHAGOGASTRODUODENOSCOPY (EGD) WITH PROPOFOL N/A 12/23/2020   Procedure: ESOPHAGOGASTRODUODENOSCOPY (EGD) WITH PROPOFOL;  Surgeon: Pasty Spillers, MD;  Location: [facility] ENDOSCOPY;  Service: Endoscopy;  Laterality: N/A;   ESOPHAGOGASTRODUODENOSCOPY (EGD) WITH PROPOFOL N/A 01/27/2022   Procedure: ESOPHAGOGASTRODUODENOSCOPY (EGD) WITH PROPOFOL;  Surgeon: [name], MD;  Location: [facility] ENDOSCOPY;  Service: Gastroenterology;  Laterality: N/A;   GIVENS CAPSULE STUDY N/A 03/10/2021   Procedure: GIVENS CAPSULE STUDY;  Surgeon: Pasty Spillers, MD;  Location: [facility] ENDOSCOPY;  Service: Endoscopy;  Laterality: N/A;   MEDIAL PARTIAL KNEE REPLACEMENT Left 1990   torn ligament. no knee replacement  at that time   PARTIAL HYSTERECTOMY     SHOULDER ARTHROSCOPY WITH OPEN ROTATOR CUFF REPAIR Left 03/01/2016   Procedure: SHOULDER ARTHROSCOPY WITH OPEN ROTATOR CUFF REPAIR;  Surgeon: Juanell Fairly, MD;  Location: [facility] ORS;  Service:  Orthopedics;  Laterality: Left;   TOTAL KNEE ARTHROPLASTY Left 02/14/2018   Procedure: TOTAL KNEE ARTHROPLASTY;  Surgeon: Juanell Fairly, MD;  Location: [facility] ORS;  Service: Orthopedics;  Laterality: Left;    Prior to Admission medications   Medication Sig Start Date End Date Taking? Authorizing Provider  albuterol (VENTOLIN HFA) 108 (90 Base) MCG/ACT inhaler Inhale 2 puffs into the lungs every 6 (six) hours as needed for wheezing.    [provider]  amitriptyline (ELAVIL) 25 MG tablet Take 25 mg by mouth at bedtime. 04/13/20   [provider]  budesonide-formoterol (SYMBICORT) 160-4.5 MCG/ACT inhaler Inhale 2 puffs into the lungs 2 (two) times daily.    [provider]  furosemide (LASIX) 20 MG tablet Take 20 mg daily by mouth.     [provider]  lisinopril (PRINIVIL,ZESTRIL) 20 MG tablet Take 20 mg by mouth daily.    [provider]  ondansetron (ZOFRAN-ODT) 4 MG disintegrating tablet Take 1 tablet (4 mg total) by mouth every 8 (eight) hours as needed for nausea or vomiting. 06/02/23   Shaune Pollack, MD  oxyCODONE-acetaminophen (PERCOCET) 5-325 MG tablet Take 1-2 tablets by mouth every 6 (six) hours as needed for moderate pain (pain score 4-6) or severe pain (pain score 7-10) (no more than 6 tabs daily). Patient not taking: Reported on 06/26/2023 06/02/23 06/01/24  Shaune Pollack, MD  potassium chloride (KLOR-CON M) 10 MEQ tablet Take 10 mEq by mouth daily. 05/14/23   [provider]  rOPINIRole (REQUIP) 2 MG tablet Take 2-4 mg by mouth at bedtime as needed (restless leg syndrome).    [provider]  simvastatin (ZOCOR) 20 MG tablet Take 20 mg by mouth at bedtime. 05/14/23   [provider]  SUMAtriptan 6 MG/0.5ML SOAJ Inject into the skin. 06/20/23   [provider]  vitamin B-12 (CYANOCOBALAMIN) 1000 MCG tablet Take 1,000 mcg by mouth daily.    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  Tobacco Use   Smoking status: Some Days    Current packs/day: 0.00    Average packs/day: 0.3 packs/day for 50.0 years (12.5 ttl pk-yrs)    Types: Cigarettes    Start date: 03/13/1966    Last attempt to quit: 03/13/2016    Years since quitting: 7.2   Smokeless tobacco: Never  Vaping Use   Vaping status: Never Used  Substance and Sexual Activity   Alcohol use: No   Drug use: No   Sexual activity: Not on file  Other Topics Concern   Not on file  Social History Narrative   Not on file   Social Drivers of Health   Financial Resource Strain:  Not on file  Food Insecurity: Not on file  Transportation Needs: Not on file  Physical Activity: Not on file  Stress: Not on file  Social Connections: Not on file  Intimate Partner Violence: Not on file    Review of Systems: See HPI, otherwise negative ROS  Physical Exam: There were no vitals taken for this visit. General:   Alert,  pleasant and cooperative in NAD Head:  Normocephalic and atraumatic. Neck:  Supple; no masses or thyromegaly. Lungs:  Clear throughout to auscultation, normal respiratory effort.    Heart:  +S1, +S2, Regular rate and rhythm, No edema. Abdomen:  Soft, nontender and nondistended. Normal bowel sounds, without guarding, and without rebound.   Neurologic:  Alert and  oriented x4;  grossly normal neurologically.  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  Questions have been answered.  All parties agreeable.   [name], MD  06/29/2023, 11:27 AM

## 2023-06-29 NOTE — Op Note (Signed)
[facility] Gastroenterology Patient Name: Hailey Farrell Procedure Date: 06/29/2023 12:02 PM MRN: [id number] Account #: [id number] Date of Birth: Jul 08, 1950 Admit Type: Outpatient Age: 73 Room: [facility] ENDO ROOM 2 Gender: Female Note Status: Finalized Instrument Name: Peds Colonoscope [id number] Procedure:             Small bowel enteroscopy Indications:           Iron deficiency anemia secondary to chronic blood loss Providers:             [name] MD, MD Referring MD:          Oswaldo Done. Hessie Diener MD, MD (Referring MD) Medicines:             Propofol per Anesthesia, Monitored Anesthesia Care Complications:         No immediate complications. Procedure:             Pre-Anesthesia Assessment:                        - Prior to the procedure, a History and Physical was                         performed, and patient medications, allergies and                         sensitivities were reviewed. The patient's tolerance                         of previous anesthesia was reviewed.                        - The risks and benefits of the procedure and the                         sedation options and risks were discussed with the                         patient. All questions were answered and informed                         consent was obtained.                        - ASA Grade Assessment: II - A patient with mild                         systemic disease.                        After obtaining informed consent, the endoscope was                         passed under direct vision. Throughout the procedure,                         the patient's blood pressure, pulse, and oxygen                         saturations were monitored continuously. The  Colonoscope was introduced through the mouth and                         advanced to the proximal jejunum. The small bowel                         enteroscopy was accomplished with ease. The patient                          tolerated the procedure well. Findings:      The esophagus was normal.      The stomach was normal.      Multiple angioectasias with stigmata of recent bleeding were found in       the third portion of the duodenum. Coagulation for hemostasis using       argon plasma at 0.5 liters/minute and 20 watts was successful.      There was no evidence of significant pathology in the entire examined       portion of jejunum. Impression:            - Normal esophagus.                        - Normal stomach.                        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 Treated with argon plasma coagulation (APC).                        - The examined portion of the jejunum was normal.                        - No specimens collected. Recommendation:        - Discharge patient to home (with escort).                        - Resume previous diet.                        - Use Prilosec (omeprazole) 40 mg PO daily                         indefinitely.                        - Return to GI office as previously scheduled. Procedure Code(s):     --- Professional ---                        [phone], Small intestinal endoscopy, enteroscopy beyond                         second portion of duodenum, not including ileum; with                         control of bleeding (eg, injection, bipolar cautery,  unipolar cautery, laser, heater probe, stapler, plasma                         coagulator) Diagnosis Code(s):     --- Professional ---                        K31.811, Angiodysplasia of stomach and duodenum with                         bleeding                        D50.0, Iron deficiency anemia secondary to blood loss                         (chronic) CPT copyright 2022 American Medical Association. All rights reserved. The codes documented in this report are preliminary and upon coder review may  be revised to meet current compliance  requirements. [name], MD [name] MD, MD 06/29/2023 12:39:51 PM This report has been signed electronically. Number of Addenda: 0 Note Initiated On: 06/29/2023 12:02 PM Estimated Blood Loss:  Estimated blood loss: none.      [facility]

## 2023-06-29 NOTE — Transfer of Care (Signed)
Immediate Anesthesia Transfer of Care Note  Patient: Hailey Farrell  Procedure(s) Performed: ENTEROSCOPY HOT HEMOSTASIS (ARGON PLASMA COAGULATION/BICAP)  Patient Location: PACU and Endoscopy Unit  Anesthesia Type:General  Level of Consciousness: sedated  Airway & Oxygen Therapy: Patient Spontanous Breathing  Post-op Assessment: Report given to RN and Post -op Vital signs reviewed and stable  Post vital signs: Reviewed and stable  Last Vitals:  Vitals Value Taken Time  BP    Temp    Pulse    Resp    SpO2      Last Pain:  Vitals:   06/29/23 1133  TempSrc: Temporal  PainSc: 0-No pain         Complications: No notable events documented.

## 2023-07-02 ENCOUNTER — Encounter: Payer: Self-pay | Admitting: Gastroenterology

## 2023-07-06 DIAGNOSIS — M7541 Impingement syndrome of right shoulder: Secondary | ICD-10-CM | POA: Insufficient documentation

## 2023-07-09 ENCOUNTER — Other Ambulatory Visit: Payer: Self-pay | Admitting: *Deleted

## 2023-07-09 DIAGNOSIS — D649 Anemia, unspecified: Secondary | ICD-10-CM

## 2023-07-09 DIAGNOSIS — D509 Iron deficiency anemia, unspecified: Secondary | ICD-10-CM

## 2023-07-10 ENCOUNTER — Encounter: Payer: Self-pay | Admitting: Oncology

## 2023-07-10 ENCOUNTER — Inpatient Hospital Stay: Payer: 59 | Attending: Oncology

## 2023-07-10 ENCOUNTER — Inpatient Hospital Stay (HOSPITAL_BASED_OUTPATIENT_CLINIC_OR_DEPARTMENT_OTHER): Payer: 59 | Admitting: Oncology

## 2023-07-10 VITALS — BP 142/91 | HR 9 | Temp 97.6°F | Resp 16 | Ht 64.0 in | Wt 158.0 lb

## 2023-07-10 DIAGNOSIS — D509 Iron deficiency anemia, unspecified: Secondary | ICD-10-CM | POA: Insufficient documentation

## 2023-07-10 DIAGNOSIS — R4589 Other symptoms and signs involving emotional state: Secondary | ICD-10-CM

## 2023-07-10 DIAGNOSIS — F17210 Nicotine dependence, cigarettes, uncomplicated: Secondary | ICD-10-CM | POA: Insufficient documentation

## 2023-07-10 DIAGNOSIS — D649 Anemia, unspecified: Secondary | ICD-10-CM

## 2023-07-10 DIAGNOSIS — Z79899 Other long term (current) drug therapy: Secondary | ICD-10-CM | POA: Diagnosis not present

## 2023-07-10 LAB — CBC WITH DIFFERENTIAL/PLATELET
Abs Immature Granulocytes: 0.02 10*3/uL (ref 0.00–0.07)
Basophils Absolute: 0.1 10*3/uL (ref 0.0–0.1)
Basophils Relative: 1 %
Eosinophils Absolute: 0 10*3/uL (ref 0.0–0.5)
Eosinophils Relative: 0 %
HCT: 36.2 % (ref 36.0–46.0)
Hemoglobin: 11 g/dL — ABNORMAL LOW (ref 12.0–15.0)
Immature Granulocytes: 0 %
Lymphocytes Relative: 14 %
Lymphs Abs: 1.1 10*3/uL (ref 0.7–4.0)
MCH: 24.4 pg — ABNORMAL LOW (ref 26.0–34.0)
MCHC: 30.4 g/dL (ref 30.0–36.0)
MCV: 80.4 fL (ref 80.0–100.0)
Monocytes Absolute: 0.6 10*3/uL (ref 0.1–1.0)
Monocytes Relative: 8 %
Neutro Abs: 5.9 10*3/uL (ref 1.7–7.7)
Neutrophils Relative %: 77 %
Platelets: 316 10*3/uL (ref 150–400)
RBC: 4.5 MIL/uL (ref 3.87–5.11)
RDW: 22.5 % — ABNORMAL HIGH (ref 11.5–15.5)
WBC: 7.6 10*3/uL (ref 4.0–10.5)
nRBC: 0 % (ref 0.0–0.2)

## 2023-07-10 LAB — SAMPLE TO BLOOD BANK

## 2023-07-10 NOTE — Progress Notes (Signed)
  [facility]  [phone] [phone]  ID: Hailey Farrell OB: 02/28/1951  MR#: [id number]  RDW#:[id number]  Patient Care Team: Kandis Stefano Iles, MD as PCP - General (Family Medicine) Jacobo Evalene PARAS, MD as Consulting Physician (Oncology)  CHIEF COMPLAINT: Iron  deficiency anemia  INTERVAL HISTORY: Patient returns to clinic today for repeat laboratory, further evaluation, and consideration of additional blood.  She does not complain of any weakness or fatigue.  She currently feels well. She has no neurologic complaints. She denies any recent fevers or illnesses.  She has a fair appetite.  She denies any chest pain, shortness of breath, cough, or hemoptysis.  She denies any nausea, vomiting, constipation, or diarrhea.  She denies any hematochezia.  She has no urinary complaints.  Patient offers no specific complaints today.  REVIEW OF SYSTEMS:   Review of Systems  Constitutional: Negative.  Negative for fever, malaise/fatigue and weight loss.  Respiratory: Negative.  Negative for cough and shortness of breath.   Cardiovascular: Negative.  Negative for chest pain and leg swelling.  Gastrointestinal: Negative.  Negative for abdominal pain, blood in stool and melena.  Genitourinary: Negative.  Negative for hematuria.  Musculoskeletal: Negative.  Negative for back pain.  Skin: Negative.  Negative for rash.  Neurological: Negative.  Negative for dizziness, focal weakness, weakness and headaches.  Psychiatric/Behavioral: Negative.  The patient is not nervous/anxious.     As per HPI. Otherwise, a complete review of systems is negative.  PAST MEDICAL HISTORY: Past Medical History:  Diagnosis Date   Anemia    Arthritis    Breast cancer (HCC) 1990   Right   COPD (chronic obstructive pulmonary disease) (HCC)    Dyspnea    DOE   Elevated lipids    Hypertension    Lower extremity edema    Migraines    Migraines    Personal history of  radiation therapy 1990   Breast   Restless leg syndrome    Rotator cuff injury     PAST SURGICAL HISTORY: Past Surgical History:  Procedure Laterality Date   ABDOMINAL HYSTERECTOMY     BACK SURGERY  08/2017   CERVICAL FUSION   BREAST BIOPSY Right 1990   LUMPECTOMY.  took lymph nodes   BREAST LUMPECTOMY     COLONOSCOPY WITH PROPOFOL  N/A 07/20/2017   Procedure: COLONOSCOPY WITH PROPOFOL ;  Surgeon: Janalyn Keene NOVAK, MD;  Location: [facility] ENDOSCOPY;  Service: Endoscopy;  Laterality: N/A;   COLONOSCOPY WITH PROPOFOL  N/A 01/27/2022   Procedure: COLONOSCOPY WITH PROPOFOL ;  Surgeon: Therisa Bi, MD;  Location: [facility] ENDOSCOPY;  Service: Gastroenterology;  Laterality: N/A;   ENTEROSCOPY N/A 08/08/2017   Procedure: ENTEROSCOPY;  Surgeon: Unk Corinn Skiff, MD;  Location: [facility] ENDOSCOPY;  Service: Gastroenterology;  Laterality: N/A;   ENTEROSCOPY N/A 05/07/2020   Procedure: ENTEROSCOPY with Adult colonoscope;  Surgeon: Janalyn Keene NOVAK, MD;  Location: [facility] ENDOSCOPY;  Service: Endoscopy;  Laterality: N/A;   ENTEROSCOPY N/A 03/17/2021   Procedure: ENTEROSCOPY;  Surgeon: Janalyn Keene NOVAK, MD;  Location: [facility] ENDOSCOPY;  Service: Endoscopy;  Laterality: N/A;  PUSH   ENTEROSCOPY N/A 07/13/2021   Procedure: ENTEROSCOPY;  Surgeon: Unk Corinn Skiff, MD;  Location: [facility] ENDOSCOPY;  Service: Gastroenterology;  Laterality: N/A;   ENTEROSCOPY N/A 10/26/2021   Procedure: ENTEROSCOPY;  Surgeon: Unk Corinn Skiff, MD;  Location: [facility] ENDOSCOPY;  Service: Gastroenterology;  Laterality: N/A;  push   ENTEROSCOPY N/A 07/27/2022   Procedure: ENTEROSCOPY;  Surgeon: Jinny Carmine, MD;  Location: [facility] ENDOSCOPY;  Service:  Endoscopy;  Laterality: N/A;  Push   ENTEROSCOPY N/A 06/29/2023   Procedure: ENTEROSCOPY;  Surgeon: Therisa Bi, MD;  Location: [facility] ENDOSCOPY;  Service: Gastroenterology;  Laterality: N/A;  Push   ESOPHAGOGASTRODUODENOSCOPY Left 04/13/2017   Procedure: ESOPHAGOGASTRODUODENOSCOPY (EGD);   Surgeon: Janalyn Keene NOVAK, MD;  Location: [facility] ENDOSCOPY;  Service: Endoscopy;  Laterality: Left;   ESOPHAGOGASTRODUODENOSCOPY (EGD) WITH PROPOFOL  N/A 07/20/2017   Procedure: ESOPHAGOGASTRODUODENOSCOPY (EGD) WITH PROPOFOL ;  Surgeon: Janalyn Keene NOVAK, MD;  Location: [facility] ENDOSCOPY;  Service: Endoscopy;  Laterality: N/A;   ESOPHAGOGASTRODUODENOSCOPY (EGD) WITH PROPOFOL  N/A 12/12/2017   Procedure: ESOPHAGOGASTRODUODENOSCOPY (EGD) WITH PROPOFOL ;  Surgeon: Janalyn Keene NOVAK, MD;  Location: [facility] ENDOSCOPY;  Service: Endoscopy;  Laterality: N/A;   ESOPHAGOGASTRODUODENOSCOPY (EGD) WITH PROPOFOL  N/A 12/23/2020   Procedure: ESOPHAGOGASTRODUODENOSCOPY (EGD) WITH PROPOFOL ;  Surgeon: Janalyn Keene NOVAK, MD;  Location: [facility] ENDOSCOPY;  Service: Endoscopy;  Laterality: N/A;   ESOPHAGOGASTRODUODENOSCOPY (EGD) WITH PROPOFOL  N/A 01/27/2022   Procedure: ESOPHAGOGASTRODUODENOSCOPY (EGD) WITH PROPOFOL ;  Surgeon: Therisa Bi, MD;  Location: [facility] ENDOSCOPY;  Service: Gastroenterology;  Laterality: N/A;   GIVENS CAPSULE STUDY N/A 03/10/2021   Procedure: GIVENS CAPSULE STUDY;  Surgeon: Janalyn Keene NOVAK, MD;  Location: [facility] ENDOSCOPY;  Service: Endoscopy;  Laterality: N/A;   HOT HEMOSTASIS  06/29/2023   Procedure: HOT HEMOSTASIS (ARGON PLASMA COAGULATION/BICAP);  Surgeon: Therisa Bi, MD;  Location[facility] ENDOSCOPY;  Service: Gastroenterology;;   MEDIAL PARTIAL KNEE REPLACEMENT Left 1990   torn ligament. no knee replacement at that time   PARTIAL HYSTERECTOMY     SHOULDER ARTHROSCOPY WITH OPEN ROTATOR CUFF REPAIR Left 03/01/2016   Procedure: SHOULDER ARTHROSCOPY WITH OPEN ROTATOR CUFF REPAIR;  Surgeon: Franky Cranker, MD;  Location: [facility] ORS;  Service: Orthopedics;  Laterality: Left;   TOTAL KNEE ARTHROPLASTY Left 02/14/2018   Procedure: TOTAL KNEE ARTHROPLASTY;  Surgeon: Cranker Franky, MD;  Location: [facility] ORS;  Service: Orthopedics;  Laterality: Left;    FAMILY HISTORY: Family History  Problem  Relation Age of Onset   CAD Mother    CAD Sister    Breast cancer Neg Hx     ADVANCED DIRECTIVES (Y/N):  N  HEALTH MAINTENANCE: Social History   Tobacco Use   Smoking status: Some Days    Current packs/day: 0.00    Average packs/day: 0.3 packs/day for 50.0 years (12.5 ttl pk-yrs)    Types: Cigarettes    Start date: 03/13/1966    Last attempt to quit: 03/13/2016    Years since quitting: 7.3   Smokeless tobacco: Never  Vaping Use   Vaping status: Never Used  Substance Use Topics   Alcohol use: No   Drug use: No     Colonoscopy:  PAP:  Bone density:  Lipid panel:  Allergies  Allergen Reactions   Latex Rash    Welts over body    Current Outpatient Medications  Medication Sig Dispense Refill   albuterol  (VENTOLIN  HFA) 108 (90 Base) MCG/ACT inhaler Inhale 2 puffs into the lungs every 6 (six) hours as needed for wheezing.     amitriptyline  (ELAVIL ) 25 MG tablet Take 25 mg by mouth at bedtime.     budesonide-formoterol  (SYMBICORT) 160-4.5 MCG/ACT inhaler Inhale 2 puffs into the lungs 2 (two) times daily.     furosemide  (LASIX ) 20 MG tablet Take 20 mg daily by mouth.      lisinopril  (PRINIVIL ,ZESTRIL ) 20 MG tablet Take 20 mg by mouth daily.     omeprazole  (PRILOSEC  OTC) 20 MG tablet Take 2 tablets (40 mg total) by mouth daily.  28 tablet 1   potassium chloride  (KLOR-CON  M) 10 MEQ tablet Take 10 mEq by mouth daily.     rOPINIRole  (REQUIP ) 2 MG tablet Take 2-4 mg by mouth at bedtime as needed (restless leg syndrome).     simvastatin  (ZOCOR ) 20 MG tablet Take 20 mg by mouth at bedtime.     SUMAtriptan  6 MG/0.5ML SOAJ Inject into the skin.     vitamin B-12 (CYANOCOBALAMIN ) 1000 MCG tablet Take 1,000 mcg by mouth daily.     ondansetron  (ZOFRAN -ODT) 4 MG disintegrating tablet Take 1 tablet (4 mg total) by mouth every 8 (eight) hours as needed for nausea or vomiting. (Patient not taking: Reported on 06/29/2023) 20 tablet 0   oxyCODONE -acetaminophen  (PERCOCET) 5-325 MG tablet Take 1-2  tablets by mouth every 6 (six) hours as needed for moderate pain (pain score 4-6) or severe pain (pain score 7-10) (no more than 6 tabs daily). (Patient not taking: Reported on 07/10/2023) 20 tablet 0   No current facility-administered medications for this visit.    OBJECTIVE: Vitals:   07/10/23 0933  BP: (!) 142/91  Pulse: (!) 9  Resp: 16  Temp: 97.6 F (36.4 C)  SpO2: 98%      Body mass index is 27.12 kg/m.    ECOG FS:0 - Asymptomatic  General: Well-developed, well-nourished, no acute distress. Eyes: Pink conjunctiva, anicteric sclera. HEENT: Normocephalic, moist mucous membranes. Lungs: No audible wheezing or coughing. Heart: Regular rate and rhythm. Abdomen: Soft, nontender, no obvious distention. Musculoskeletal: No edema, cyanosis, or clubbing. Neuro: Alert, answering all questions appropriately. Cranial nerves grossly intact. Skin: No rashes or petechiae noted. Psych: Normal affect.  LAB RESULTS:  Lab Results  Component Value Date   NA 140 06/13/2023   K 3.4 (L) 06/13/2023   CL 104 06/13/2023   CO2 27 06/13/2023   GLUCOSE 111 (H) 06/13/2023   BUN 24 (H) 06/13/2023   CREATININE 0.84 06/13/2023   CALCIUM 8.5 (L) 06/13/2023   PROT 5.7 (L) 06/13/2023   ALBUMIN 3.3 (L) 06/13/2023   AST 17 06/13/2023   ALT 13 06/13/2023   ALKPHOS 28 (L) 06/13/2023   BILITOT 0.4 06/13/2023   GFRNONAA >60 06/13/2023   GFRAA >60 02/16/2018    Lab Results  Component Value Date   WBC 7.6 07/10/2023   NEUTROABS 5.9 07/10/2023   HGB 11.0 (L) 07/10/2023   HCT 36.2 07/10/2023   MCV 80.4 07/10/2023   PLT 316 07/10/2023   Lab Results  Component Value Date   IRON  21 (L) 04/17/2023   TIBC 427 04/17/2023   IRONPCTSAT 5 (L) 04/17/2023   Lab Results  Component Value Date   FERRITIN 24 04/17/2023     STUDIES: No results found.    ASSESSMENT: Iron  deficiency anemia.  PLAN:    Iron  deficiency anemia: Patient underwent repeat EGD on June 29, 2023 which revealed multiple  angioectasias with stigmata of recent bleeding.  All areas were treated with argon plasma coagulation.  Patient's hemoglobin has also improved to 11.1.  She does not require blood transfusion at this time.  Return to clinic next week for further evaluation and continuation of octreotide .   History of AVMs: EGD and octreotide  as above. Tendon repair: Patient recently tore a tendon in her right arm, but is unclear if and when she will need surgical repair. Coping/adjustment: Patient was given a referral to [facility]   Patient expressed understanding and was in agreement with this plan. She also understands that She can call clinic at any  time with any questions, concerns, or complaints.    Evalene JINNY Reusing, MD   07/10/2023 9:59 AM

## 2023-07-11 ENCOUNTER — Inpatient Hospital Stay: Payer: 59

## 2023-07-18 ENCOUNTER — Other Ambulatory Visit: Payer: Self-pay

## 2023-07-18 ENCOUNTER — Other Ambulatory Visit: Payer: Self-pay | Admitting: *Deleted

## 2023-07-18 DIAGNOSIS — D509 Iron deficiency anemia, unspecified: Secondary | ICD-10-CM

## 2023-07-18 MED ORDER — OMEPRAZOLE 40 MG PO CPDR: 40.0000 mg | DELAYED_RELEASE_CAPSULE | Freq: Every day | ORAL | 3 refills | Status: AC

## 2023-07-19 ENCOUNTER — Encounter: Payer: Self-pay | Admitting: Oncology

## 2023-07-19 ENCOUNTER — Inpatient Hospital Stay: Payer: 59

## 2023-07-19 ENCOUNTER — Inpatient Hospital Stay (HOSPITAL_BASED_OUTPATIENT_CLINIC_OR_DEPARTMENT_OTHER): Payer: 59 | Admitting: Oncology

## 2023-07-19 VITALS — BP 163/90 | HR 86 | Temp 96.7°F | Wt 158.0 lb

## 2023-07-19 DIAGNOSIS — D509 Iron deficiency anemia, unspecified: Secondary | ICD-10-CM

## 2023-07-19 LAB — CBC WITH DIFFERENTIAL/PLATELET
Abs Immature Granulocytes: 0.04 10*3/uL (ref 0.00–0.07)
Basophils Absolute: 0.1 10*3/uL (ref 0.0–0.1)
Basophils Relative: 1 %
Eosinophils Absolute: 0 10*3/uL (ref 0.0–0.5)
Eosinophils Relative: 1 %
HCT: 35.2 % — ABNORMAL LOW (ref 36.0–46.0)
Hemoglobin: 10.8 g/dL — ABNORMAL LOW (ref 12.0–15.0)
Immature Granulocytes: 1 %
Lymphocytes Relative: 17 %
Lymphs Abs: 1.2 10*3/uL (ref 0.7–4.0)
MCH: 24.5 pg — ABNORMAL LOW (ref 26.0–34.0)
MCHC: 30.7 g/dL (ref 30.0–36.0)
MCV: 79.8 fL — ABNORMAL LOW (ref 80.0–100.0)
Monocytes Absolute: 0.6 10*3/uL (ref 0.1–1.0)
Monocytes Relative: 8 %
Neutro Abs: 5.3 10*3/uL (ref 1.7–7.7)
Neutrophils Relative %: 72 %
Platelets: 337 10*3/uL (ref 150–400)
RBC: 4.41 MIL/uL (ref 3.87–5.11)
RDW: 22.8 % — ABNORMAL HIGH (ref 11.5–15.5)
WBC: 7.2 10*3/uL (ref 4.0–10.5)
nRBC: 0 % (ref 0.0–0.2)

## 2023-07-19 LAB — SAMPLE TO BLOOD BANK

## 2023-07-19 MED ORDER — OCTREOTIDE ACETATE 30 MG IM KIT
30.0000 mg | PACK | Freq: Once | INTRAMUSCULAR | Status: AC
  Administered 2023-07-19: 30 mg via INTRAMUSCULAR
  Filled 2023-07-19: qty 1

## 2023-07-19 NOTE — Progress Notes (Signed)
[facility]  [phone] [phone]  ID: RAVEEN WIESELER OB: 1951-05-08  MR#: [id number]  EPP#:[id number]  Patient Care Team: Oswaldo Conroy, MD as PCP - General (Family Medicine) Jeralyn Ruths, MD as Consulting Physician (Oncology)  CHIEF COMPLAINT: Iron deficiency anemia  INTERVAL HISTORY: Patient returns to clinic today for repeat laboratory, further evaluation, and continuation of octreotide.  She currently feels well and is asymptomatic.  She does not complain of any weakness or fatigue today. She has no neurologic complaints. She denies any recent fevers or illnesses.  She has a fair appetite.  She denies any chest pain, shortness of breath, cough, or hemoptysis.  She denies any nausea, vomiting, constipation, or diarrhea.  She denies any hematochezia.  She has no urinary complaints.  Patient offers no specific complaints today.  REVIEW OF SYSTEMS:   Review of Systems  Constitutional: Negative.  Negative for fever, malaise/fatigue and weight loss.  Respiratory: Negative.  Negative for cough and shortness of breath.   Cardiovascular: Negative.  Negative for chest pain and leg swelling.  Gastrointestinal: Negative.  Negative for abdominal pain, blood in stool and melena.  Genitourinary: Negative.  Negative for hematuria.  Musculoskeletal: Negative.  Negative for back pain.  Skin: Negative.  Negative for rash.  Neurological: Negative.  Negative for dizziness, focal weakness, weakness and headaches.  Psychiatric/Behavioral: Negative.  The patient is not nervous/anxious.     As per HPI. Otherwise, a complete review of systems is negative.  PAST MEDICAL HISTORY: Past Medical History:  Diagnosis Date   Anemia    Arthritis    Breast cancer (HCC) 1990   Right   COPD (chronic obstructive pulmonary disease) (HCC)    Dyspnea    DOE   Elevated lipids    Hypertension    Lower extremity edema    Migraines    Migraines    Personal  history of radiation therapy 1990   Breast   Restless leg syndrome    Rotator cuff injury     PAST SURGICAL HISTORY: Past Surgical History:  Procedure Laterality Date   ABDOMINAL HYSTERECTOMY     BACK SURGERY  08/2017   CERVICAL FUSION   BREAST BIOPSY Right 1990   LUMPECTOMY.  took lymph nodes   BREAST LUMPECTOMY     COLONOSCOPY WITH PROPOFOL N/A 07/20/2017   Procedure: COLONOSCOPY WITH PROPOFOL;  Surgeon: Pasty Spillers, MD;  Location: [facility] ENDOSCOPY;  Service: Endoscopy;  Laterality: N/A;   COLONOSCOPY WITH PROPOFOL N/A 01/27/2022   Procedure: COLONOSCOPY WITH PROPOFOL;  Surgeon: [name], MD;  Location: [facility] ENDOSCOPY;  Service: Gastroenterology;  Laterality: N/A;   ENTEROSCOPY N/A 08/08/2017   Procedure: ENTEROSCOPY;  Surgeon: Toney Reil, MD;  Location: [facility] ENDOSCOPY;  Service: Gastroenterology;  Laterality: N/A;   ENTEROSCOPY N/A 05/07/2020   Procedure: ENTEROSCOPY with Adult colonoscope;  Surgeon: Pasty Spillers, MD;  Location: [facility] ENDOSCOPY;  Service: Endoscopy;  Laterality: N/A;   ENTEROSCOPY N/A 03/17/2021   Procedure: ENTEROSCOPY;  Surgeon: Pasty Spillers, MD;  Location: [facility] ENDOSCOPY;  Service: Endoscopy;  Laterality: N/A;  PUSH   ENTEROSCOPY N/A 07/13/2021   Procedure: ENTEROSCOPY;  Surgeon: Toney Reil, MD;  Location: [facility] ENDOSCOPY;  Service: Gastroenterology;  Laterality: N/A;   ENTEROSCOPY N/A 10/26/2021   Procedure: ENTEROSCOPY;  Surgeon: Toney Reil, MD;  Location: [facility] ENDOSCOPY;  Service: Gastroenterology;  Laterality: N/A;  push   ENTEROSCOPY N/A 07/27/2022   Procedure: ENTEROSCOPY;  Surgeon: Midge Minium, MD;  Location: [facility]  ENDOSCOPY;  Service: Endoscopy;  Laterality: N/A;  Push   ENTEROSCOPY N/A 06/29/2023   Procedure: ENTEROSCOPY;  Surgeon: [name], MD;  Location: [facility] ENDOSCOPY;  Service: Gastroenterology;  Laterality: N/A;  Push   ESOPHAGOGASTRODUODENOSCOPY Left 04/13/2017   Procedure:  ESOPHAGOGASTRODUODENOSCOPY (EGD);  Surgeon: Pasty Spillers, MD;  Location: [facility] ENDOSCOPY;  Service: Endoscopy;  Laterality: Left;   ESOPHAGOGASTRODUODENOSCOPY (EGD) WITH PROPOFOL N/A 07/20/2017   Procedure: ESOPHAGOGASTRODUODENOSCOPY (EGD) WITH PROPOFOL;  Surgeon: Pasty Spillers, MD;  Location: [facility] ENDOSCOPY;  Service: Endoscopy;  Laterality: N/A;   ESOPHAGOGASTRODUODENOSCOPY (EGD) WITH PROPOFOL N/A 12/12/2017   Procedure: ESOPHAGOGASTRODUODENOSCOPY (EGD) WITH PROPOFOL;  Surgeon: Pasty Spillers, MD;  Location: [facility] ENDOSCOPY;  Service: Endoscopy;  Laterality: N/A;   ESOPHAGOGASTRODUODENOSCOPY (EGD) WITH PROPOFOL N/A 12/23/2020   Procedure: ESOPHAGOGASTRODUODENOSCOPY (EGD) WITH PROPOFOL;  Surgeon: Pasty Spillers, MD;  Location: [facility] ENDOSCOPY;  Service: Endoscopy;  Laterality: N/A;   ESOPHAGOGASTRODUODENOSCOPY (EGD) WITH PROPOFOL N/A 01/27/2022   Procedure: ESOPHAGOGASTRODUODENOSCOPY (EGD) WITH PROPOFOL;  Surgeon: [name], MD;  Location: [facility] ENDOSCOPY;  Service: Gastroenterology;  Laterality: N/A;   GIVENS CAPSULE STUDY N/A 03/10/2021   Procedure: GIVENS CAPSULE STUDY;  Surgeon: Pasty Spillers, MD;  Location: [facility] ENDOSCOPY;  Service: Endoscopy;  Laterality: N/A;   HOT HEMOSTASIS  06/29/2023   Procedure: HOT HEMOSTASIS (ARGON PLASMA COAGULATION/BICAP);  Surgeon: [name], MD;  Location: [facility] ENDOSCOPY;  Service: Gastroenterology;;   MEDIAL PARTIAL KNEE REPLACEMENT Left 1990   torn ligament. no knee replacement at that time   PARTIAL HYSTERECTOMY     SHOULDER ARTHROSCOPY WITH OPEN ROTATOR CUFF REPAIR Left 03/01/2016   Procedure: SHOULDER ARTHROSCOPY WITH OPEN ROTATOR CUFF REPAIR;  Surgeon: Juanell Fairly, MD;  Location: [facility] ORS;  Service: Orthopedics;  Laterality: Left;   TOTAL KNEE ARTHROPLASTY Left 02/14/2018   Procedure: TOTAL KNEE ARTHROPLASTY;  Surgeon: Juanell Fairly, MD;  Location: [facility] ORS;  Service: Orthopedics;  Laterality: Left;    FAMILY  HISTORY: Family History  Problem Relation Age of Onset   CAD Mother    CAD Sister    Breast cancer Neg Hx     ADVANCED DIRECTIVES (Y/N):  N  HEALTH MAINTENANCE: Social History   Tobacco Use   Smoking status: Some Days    Current packs/day: 0.00    Average packs/day: 0.3 packs/day for 50.0 years (12.5 ttl pk-yrs)    Types: Cigarettes    Start date: 03/13/1966    Last attempt to quit: 03/13/2016    Years since quitting: 7.3   Smokeless tobacco: Never  Vaping Use   Vaping status: Never Used  Substance Use Topics   Alcohol use: No   Drug use: No     Colonoscopy:  PAP:  Bone density:  Lipid panel:  Allergies  Allergen Reactions   Latex Rash    Welts over body    Current Outpatient Medications  Medication Sig Dispense Refill   albuterol (VENTOLIN HFA) 108 (90 Base) MCG/ACT inhaler Inhale 2 puffs into the lungs every 6 (six) hours as needed for wheezing.     amitriptyline (ELAVIL) 25 MG tablet Take 25 mg by mouth at bedtime.     budesonide-formoterol (SYMBICORT) 160-4.5 MCG/ACT inhaler Inhale 2 puffs into the lungs 2 (two) times daily.     furosemide (LASIX) 20 MG tablet Take 20 mg daily by mouth.      lisinopril (PRINIVIL,ZESTRIL) 20 MG tablet Take 20 mg by mouth daily.     omeprazole (PRILOSEC) 40 MG capsule Take 1 capsule (40 mg total) by  mouth daily. 90 capsule 3   potassium chloride (KLOR-CON M) 10 MEQ tablet Take 10 mEq by mouth daily.     rOPINIRole (REQUIP) 2 MG tablet Take 2-4 mg by mouth at bedtime as needed (restless leg syndrome).     simvastatin (ZOCOR) 20 MG tablet Take 20 mg by mouth at bedtime.     SUMAtriptan 6 MG/0.5ML SOAJ Inject into the skin.     vitamin B-12 (CYANOCOBALAMIN) 1000 MCG tablet Take 1,000 mcg by mouth daily.     ondansetron (ZOFRAN-ODT) 4 MG disintegrating tablet Take 1 tablet (4 mg total) by mouth every 8 (eight) hours as needed for nausea or vomiting. (Patient not taking: Reported on 06/29/2023) 20 tablet 0   oxyCODONE-acetaminophen  (PERCOCET) 5-325 MG tablet Take 1-2 tablets by mouth every 6 (six) hours as needed for moderate pain (pain score 4-6) or severe pain (pain score 7-10) (no more than 6 tabs daily). (Patient not taking: Reported on 06/26/2023) 20 tablet 0   No current facility-administered medications for this visit.   Facility-Administered Medications Ordered in Other Visits  Medication Dose Route Frequency Provider Last Rate Last Admin   octreotide (SANDOSTATIN LAR) IM injection 30 mg  30 mg Intramuscular Once Jeralyn Ruths, MD        OBJECTIVE: Vitals:   07/19/23 0940  BP: (!) 163/90  Pulse: 86  Temp: (!) 96.7 F (35.9 C)  SpO2: 100%      Body mass index is 27.12 kg/m.    ECOG FS:0 - Asymptomatic  General: Well-developed, well-nourished, no acute distress. Eyes: Pink conjunctiva, anicteric sclera. HEENT: Normocephalic, moist mucous membranes. Lungs: No audible wheezing or coughing. Heart: Regular rate and rhythm. Abdomen: Soft, nontender, no obvious distention. Musculoskeletal: No edema, cyanosis, or clubbing. Neuro: Alert, answering all questions appropriately. Cranial nerves grossly intact. Skin: No rashes or petechiae noted. Psych: Normal affect.  LAB RESULTS:  Lab Results  Component Value Date   NA 140 06/13/2023   K 3.4 (L) 06/13/2023   CL 104 06/13/2023   CO2 27 06/13/2023   GLUCOSE 111 (H) 06/13/2023   BUN 24 (H) 06/13/2023   CREATININE 0.84 06/13/2023   CALCIUM 8.5 (L) 06/13/2023   PROT 5.7 (L) 06/13/2023   ALBUMIN 3.3 (L) 06/13/2023   AST 17 06/13/2023   ALT 13 06/13/2023   ALKPHOS 28 (L) 06/13/2023   BILITOT 0.4 06/13/2023   GFRNONAA >60 06/13/2023   GFRAA >60 02/16/2018    Lab Results  Component Value Date   WBC 7.2 07/19/2023   NEUTROABS 5.3 07/19/2023   HGB 10.8 (L) 07/19/2023   HCT 35.2 (L) 07/19/2023   MCV 79.8 (L) 07/19/2023   PLT 337 07/19/2023   Lab Results  Component Value Date   IRON 21 (L) 04/17/2023   TIBC 427 04/17/2023   IRONPCTSAT 5 (L)  04/17/2023   Lab Results  Component Value Date   FERRITIN 24 04/17/2023     STUDIES: No results found.    ASSESSMENT: Iron deficiency anemia.  PLAN:    Iron deficiency anemia: Patient underwent repeat EGD on June 29, 2023 which revealed multiple angioectasias with stigmata of recent bleeding.  All areas were treated with argon plasma coagulation.  Patient's hemoglobin is stable at 10.8 today.  She does not require blood transfusion.  Proceed with octreotide.  Return to clinic in 2 weeks for laboratory work only and then in 4 weeks for further evaluation and continuation of octreotide.     History of AVMs: EGD and octreotide as  above.  Patient reports she has follow-up with GI on August 07, 2023. Tendon repair: Patient recently tore a tendon in her right arm, but is unclear if and when she will need surgical repair. Coping/adjustment: Patient was previously given a referral to [facility].  I spent a total of 30 minutes reviewing chart data, face-to-face evaluation with the patient, counseling and coordination of care as detailed above.    Patient expressed understanding and was in agreement with this plan. She also understands that She can call clinic at any time with any questions, concerns, or complaints.    Jeralyn Ruths, MD   07/19/2023 9:49 AM

## 2023-07-20 ENCOUNTER — Ambulatory Visit: Payer: 59

## 2023-07-20 ENCOUNTER — Inpatient Hospital Stay: Payer: 59

## 2023-08-02 ENCOUNTER — Inpatient Hospital Stay: Payer: 59

## 2023-08-06 ENCOUNTER — Encounter: Payer: Self-pay | Admitting: Oncology

## 2023-08-09 ENCOUNTER — Encounter: Payer: Self-pay | Admitting: Gastroenterology

## 2023-08-09 ENCOUNTER — Other Ambulatory Visit: Payer: Self-pay

## 2023-08-09 ENCOUNTER — Ambulatory Visit: Payer: 59 | Admitting: Gastroenterology

## 2023-08-09 VITALS — BP 97/60 | HR 103 | Temp 98.3°F | Ht 64.0 in | Wt 155.4 lb

## 2023-08-09 DIAGNOSIS — D500 Iron deficiency anemia secondary to blood loss (chronic): Secondary | ICD-10-CM

## 2023-08-09 DIAGNOSIS — D509 Iron deficiency anemia, unspecified: Secondary | ICD-10-CM | POA: Diagnosis not present

## 2023-08-09 DIAGNOSIS — Z8719 Personal history of other diseases of the digestive system: Secondary | ICD-10-CM | POA: Diagnosis not present

## 2023-08-09 DIAGNOSIS — K921 Melena: Secondary | ICD-10-CM

## 2023-08-09 DIAGNOSIS — Q2730 Arteriovenous malformation, site unspecified: Secondary | ICD-10-CM

## 2023-08-09 DIAGNOSIS — K5520 Angiodysplasia of colon without hemorrhage: Secondary | ICD-10-CM

## 2023-08-09 NOTE — Progress Notes (Signed)
 Arlyss Repress, MD [address]  [address]  [place], [place] [address]  Main: [phone]  Fax: [phone]    Gastroenterology Consultation  Referring Provider:     Oswaldo Conroy, MD Primary Care Physician:  Oswaldo Conroy, MD Primary Gastroenterologist:  Dr. Arlyss Repress Reason for Consultation: Chronic iron deficiency anemia, melena        HPI:   Hailey Farrell is a 73 y.o. female referred by Dr. Hessie Diener, Earl Lagos, MD  for consultation & management of chronic iron deficiency anemia.  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 pylori.  Patient has been receiving parenteral iron therapy.  Patient was admitted to [facility] on 07/12/2021 secondary to acute on chronic iron deficiency anemia, hemoglobin dropped to 5.8 from 8.2, received blood transfusion as well as iron infusions.  She reports followed by hematology since discharge, has been receiving parenteral iron therapy.  She recently had another acute on chronic blood loss anemia on 5/8, preceding this she reported 2 to 3 days of black stools, her repeat hemoglobin dropped from 9.8 to 6.5, she received 1 unit of PRBCs at the cancer center along with IV iron.  She is currently scheduled to receive weekly iron infusions.  Patient currently reports having brown bowel movements.  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  Patient was recently admitted to [facility] on 01/25/2022 secondary to 2 weeks history of melena.  Her hemoglobin on admission was 5.1, received 4 units of PRBCs and underwent colonoscopy with small bowel enteroscopy with no source of bleeding  identified.  Her hemoglobin at the time of discharge was 8.7 on 01/28/2022.  Patient tells me that she waited so long hoping that her bleeding would stop but she became very weak.  She is currently having brown bowel movements daily.  She is accompanied by her husband.  Video capsule endoscopy from 01/27/2022 revealed a nonbleeding small AVM in small bowel.  Follow-up visit 07/26/2022 Patient reports 2 days history of black tarry stools.  She had 1 bowel movement day before yesterday, 2 yesterday and 1 this morning.  She feels slightly tired.  She is tearful about bleeding again and is frustrated with recurrent bleeding and multiple procedures.  She is receiving octreotide injections as well as iron infusions.  Patient is accompanied by her husband today.  She denies any other GI symptoms.  Denies any alcohol use, NSAID use.  She is not taking any oral iron supplements  Follow-up visit 05/17/2023 Hailey Farrell is here for follow-up of worsening iron deficiency anemia.  She reports having 1 month history of black tarry stools, received 1 unit of PRBCs at cancer center today because of severe symptomatic anemia and hemoglobin of 7.2.  She received blood transfusion in September as well as in November due to critically low hemoglobin of 5.8 and 6.5 respectively.  Patient is tearful and frustrated with ongoing anemia requiring multiple blood transfusions.  She is no longer on octreotide injections, felt not working.  She is not on any oral iron.  She reports feeling tired, fatigue, loss of appetite.  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  Since I last saw her in early December, for acute on chronic blood loss anemia with ongoing melena I went ahead and did further workup including Meckel scan which was negative.  I have advised her to get admitted at tertiary care center for further  workup and she was admitted to [facility] on 05/21/2023, underwent upper endoscopy with video capsule placement on 12/17, capsule endoscopy revealed bleeding AVM in the small bowel as well as nonbleeding AVM in the small bowel.  Unfortunately, patient did not undergo treatment of AVMs during that admission with the plan to proceed push enteroscopy locally and resume octreotide injections which she stopped prior to the onset of recurrent bleeding in December 2024.  She did have recurrent bleeding in mid January, received blood transfusion as well as IV iron.  She was scheduled an office visit at [facility] on 06/28/2023 and I had a discussion with the fellow who saw the patient in the office.  They recommend repeat video capsule study to locate AVM if it's beyond reach for push enteroscopy in order to proceed with single balloon endoscopy. I was able to arrange enteroscopy on 06/29/2023 with Dr. Tobi Bastos and she had several nonbleeding AVMs in third portion of duodenum which were cauterized with APC and there was no evidence of AVMs in jejunum.  Unfortunately, patient today reports that she started noticing black tarry stools 1 week after the enteroscopy and she continues to feel very tired.  She is disappointed that her bleeding recurred.  In the interim, she also saw Dr. Orlie Dakin, received octreotide injection as well as IV iron and her hemoglobin is being maintained around 10.  She is accompanied by her husband today and she is tearful about ongoing bleeding  NSAIDs: None  Antiplts/Anticoagulants/Anti thrombotics: None  GI Procedures: Reviewed under procedures tab  Past Medical History:  Diagnosis Date   Anemia    Arthritis    Breast cancer (HCC) 1990   Right   COPD (chronic obstructive pulmonary disease) (HCC)    Dyspnea    DOE   Elevated lipids    Hypertension    Lower extremity edema    Migraines    Migraines    Personal history of radiation therapy 1990   Breast   Restless leg syndrome     Rotator cuff injury     Past Surgical History:  Procedure Laterality Date   ABDOMINAL HYSTERECTOMY     BACK SURGERY  08/2017   CERVICAL FUSION   BREAST BIOPSY Right 1990   LUMPECTOMY.  took lymph nodes   BREAST LUMPECTOMY     COLONOSCOPY WITH PROPOFOL N/A 07/20/2017   Procedure: COLONOSCOPY WITH PROPOFOL;  Surgeon: Pasty Spillers, MD;  Location: [facility] ENDOSCOPY;  Service: Endoscopy;  Laterality: N/A;   COLONOSCOPY WITH PROPOFOL N/A 01/27/2022   Procedure: COLONOSCOPY WITH PROPOFOL;  Surgeon: [name], MD;  Location: [facility] ENDOSCOPY;  Service: Gastroenterology;  Laterality: N/A;   ENTEROSCOPY N/A 08/08/2017   Procedure: ENTEROSCOPY;  Surgeon: Toney Reil, MD;  Location: [facility] ENDOSCOPY;  Service: Gastroenterology;  Laterality: N/A;   ENTEROSCOPY N/A 05/07/2020   Procedure: ENTEROSCOPY with Adult colonoscope;  Surgeon: Pasty Spillers, MD;  Location: [facility] ENDOSCOPY;  Service: Endoscopy;  Laterality: N/A;   ENTEROSCOPY N/A 03/17/2021   Procedure: ENTEROSCOPY;  Surgeon: Pasty Spillers, MD;  Location: [facility] ENDOSCOPY;  Service: Endoscopy;  Laterality: N/A;  PUSH   ENTEROSCOPY N/A 07/13/2021   Procedure: ENTEROSCOPY;  Surgeon: Toney Reil, MD;  Location: [facility] ENDOSCOPY;  Service: Gastroenterology;  Laterality: N/A;   ENTEROSCOPY N/A 10/26/2021   Procedure: ENTEROSCOPY;  Surgeon: Toney Reil, MD;  Location: [facility] ENDOSCOPY;  Service: Gastroenterology;  Laterality: N/A;  push   ENTEROSCOPY N/A 07/27/2022   Procedure: ENTEROSCOPY;  Surgeon: Midge Minium, MD;  Location: [facility] ENDOSCOPY;  Service: Endoscopy;  Laterality: N/A;  Push   ENTEROSCOPY N/A 06/29/2023   Procedure: ENTEROSCOPY;  Surgeon: [name], MD;  Location: [facility] ENDOSCOPY;  Service: Gastroenterology;  Laterality: N/A;  Push   ESOPHAGOGASTRODUODENOSCOPY Left 04/13/2017   Procedure: ESOPHAGOGASTRODUODENOSCOPY (EGD);  Surgeon: Pasty Spillers, MD;  Location: [facility] ENDOSCOPY;  Service: Endoscopy;   Laterality: Left;   ESOPHAGOGASTRODUODENOSCOPY (EGD) WITH PROPOFOL N/A 07/20/2017   Procedure: ESOPHAGOGASTRODUODENOSCOPY (EGD) WITH PROPOFOL;  Surgeon: Pasty Spillers, MD;  Location: [facility] ENDOSCOPY;  Service: Endoscopy;  Laterality: N/A;   ESOPHAGOGASTRODUODENOSCOPY (EGD) WITH PROPOFOL N/A 12/12/2017   Procedure: ESOPHAGOGASTRODUODENOSCOPY (EGD) WITH PROPOFOL;  Surgeon: Pasty Spillers, MD;  Location: [facility] ENDOSCOPY;  Service: Endoscopy;  Laterality: N/A;   ESOPHAGOGASTRODUODENOSCOPY (EGD) WITH PROPOFOL N/A 12/23/2020   Procedure: ESOPHAGOGASTRODUODENOSCOPY (EGD) WITH PROPOFOL;  Surgeon: Pasty Spillers, MD;  Location: [facility] ENDOSCOPY;  Service: Endoscopy;  Laterality: N/A;   ESOPHAGOGASTRODUODENOSCOPY (EGD) WITH PROPOFOL N/A 01/27/2022   Procedure: ESOPHAGOGASTRODUODENOSCOPY (EGD) WITH PROPOFOL;  Surgeon: [name], MD;  Location: [facility] ENDOSCOPY;  Service: Gastroenterology;  Laterality: N/A;   GIVENS CAPSULE STUDY N/A 03/10/2021   Procedure: GIVENS CAPSULE STUDY;  Surgeon: Pasty Spillers, MD;  Location: [facility] ENDOSCOPY;  Service: Endoscopy;  Laterality: N/A;   HOT HEMOSTASIS  06/29/2023   Procedure: HOT HEMOSTASIS (ARGON PLASMA COAGULATION/BICAP);  Surgeon: [name], MD;  Location: [facility] ENDOSCOPY;  Service: Gastroenterology;;   MEDIAL PARTIAL KNEE REPLACEMENT Left 1990   torn ligament. no knee replacement at that time   PARTIAL HYSTERECTOMY     SHOULDER ARTHROSCOPY WITH OPEN ROTATOR CUFF REPAIR Left 03/01/2016   Procedure: SHOULDER ARTHROSCOPY WITH OPEN ROTATOR CUFF REPAIR;  Surgeon: Juanell Fairly, MD;  Location: [facility] ORS;  Service: Orthopedics;  Laterality: Left;   TOTAL KNEE ARTHROPLASTY Left 02/14/2018   Procedure: TOTAL KNEE ARTHROPLASTY;  Surgeon: Juanell Fairly, MD;  Location: [facility] ORS;  Service: Orthopedics;  Laterality: Left;    Current Outpatient Medications:    albuterol (VENTOLIN HFA) 108 (90 Base) MCG/ACT inhaler, Inhale 2 puffs into the lungs every 6  (six) hours as needed for wheezing., Disp: , Rfl:    amitriptyline (ELAVIL) 25 MG tablet, Take 25 mg by mouth at bedtime., Disp: , Rfl:    budesonide-formoterol (SYMBICORT) 160-4.5 MCG/ACT inhaler, Inhale 2 puffs into the lungs 2 (two) times daily., Disp: , Rfl:    furosemide (LASIX) 20 MG tablet, Take 20 mg daily by mouth. , Disp: , Rfl:    lisinopril (PRINIVIL,ZESTRIL) 20 MG tablet, Take 20 mg by mouth daily., Disp: , Rfl:    potassium chloride (KLOR-CON M) 10 MEQ tablet, Take 10 mEq by mouth daily., Disp: , Rfl:    rOPINIRole (REQUIP) 2 MG tablet, Take 2-4 mg by mouth at bedtime as needed (restless leg syndrome)., Disp: , Rfl:    simvastatin (ZOCOR) 20 MG tablet, Take 20 mg by mouth at bedtime., Disp: , Rfl:    SPIRIVA HANDIHALER 18 MCG inhalation capsule, Place 18 mcg into inhaler and inhale daily., Disp: , Rfl:    SUMAtriptan 6 MG/0.5ML SOAJ, Inject into the  skin., Disp: , Rfl:    vitamin B-12 (CYANOCOBALAMIN) 1000 MCG tablet, Take 1,000 mcg by mouth daily., Disp: , Rfl:    omeprazole (PRILOSEC) 40 MG capsule, Take 1 capsule (40 mg total) by mouth daily. (Patient not taking: Reported on 08/09/2023), Disp: 90 capsule, Rfl: 3    Family History  Problem Relation Age of Onset   CAD Mother    CAD Sister    Breast cancer Neg Hx      Social History   Tobacco Use   Smoking status: Some Days    Current packs/day: 0.00    Average packs/day: 0.3 packs/day for 50.0 years (12.5 ttl pk-yrs)    Types: Cigarettes    Start date: 03/13/1966    Last attempt to quit: 03/13/2016    Years since quitting: 7.4   Smokeless tobacco: Never  Vaping Use   Vaping status: Never Used  Substance Use Topics   Alcohol use: No   Drug use: No    Allergies as of 08/09/2023 - Review Complete 08/09/2023  Allergen Reaction Noted   Latex Rash 02/03/2015    Review of Systems:    All systems reviewed and negative except where noted in HPI.   Physical Exam:  BP 97/60 (BP Location: Left Arm, Patient Position:  Sitting, Cuff Size: Normal)   Pulse (!) 103   Temp 98.3 F (36.8 C) (Oral)   Ht 5\' 4"  (1.626 m)   Wt 155 lb 6 oz (70.5 kg)   BMI 26.67 kg/m  No LMP recorded. Patient has had a hysterectomy.  General:   Alert,  Well-developed, well-nourished, pleasant and cooperative in NAD Head:  Normocephalic and atraumatic. Eyes:  Sclera clear, no icterus.   Conjunctiva pink. Ears:  Normal auditory acuity. Nose:  No deformity, discharge, or lesions. Mouth:  No deformity or lesions,oropharynx pink & moist. Neck:  Supple; no masses or thyromegaly. Lungs:  Respirations even and unlabored.  Clear throughout to auscultation.   No wheezes, crackles, or rhonchi. No acute distress. Heart:  Regular rate and rhythm; no murmurs, clicks, rubs, or gallops. Abdomen:  Normal bowel sounds. Soft, non-tender and non-distended without masses, hepatosplenomegaly or hernias noted.  No guarding or rebound tenderness.   Rectal: Not performed Msk:  Symmetrical without gross deformities. Good, equal movement & strength bilaterally. Pulses:  Normal pulses noted. Extremities:  No clubbing or edema.  No cyanosis. Neurologic:  Alert and oriented x3;  grossly normal neurologically. Skin:  Intact without significant lesions or rashes. No jaundice. Psych:  Alert and cooperative. Normal mood and affect.  Imaging Studies: No abdominal imaging  Assessment and Plan:   Hailey Farrell is a 73 y.o. 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  Patient underwent repeat colonoscopy as well as small bowel enteroscopy, small bowel AVMs were not identified.  She also underwent video capsule endoscopy on 01/27/2022 which revealed a small nonbleeding AVM in the small intestine.  She has also received about 7 monthly octreotide  injections, dependent on parenteral iron therapy and blood transfusions as needed Recurrence of melena requiring blood transfusion in September, November, December 2024 as well as in January 2025.  Unfortunately, patient did not undergo any treatment of AVMs at [facility] during her admission in December 2024, underwent push enteroscopy on 06/29/2023 at [facility], nonbleeding AVMs in third portion of  duodenum were treated with APC Meckel's scan was negative Has ongoing episodes of melena 1 week after the most recent push enteroscopy with symptomatic anemia Restarted octreotide injection, receiving IV iron as needed, closely followed by Dr. Orlie Dakin Check CBC today and schedule push enteroscopy tomorrow After push enteroscopy, if she has ongoing bleeding, will refer to [facility] for refractory GI bleed and possibly double-balloon enteroscopy  I have discussed alternative options, risks & benefits,  which include, but are not limited to, bleeding, infection, perforation,respiratory complication & drug reaction.  The patient agrees with this plan & written consent will be obtained.     Follow up closely in 4 to 6 weeks  Arlyss Repress, MD

## 2023-08-10 ENCOUNTER — Other Ambulatory Visit: Payer: Self-pay

## 2023-08-10 ENCOUNTER — Ambulatory Visit: Admitting: Anesthesiology

## 2023-08-10 ENCOUNTER — Emergency Department
Admission: EM | Admit: 2023-08-10 | Discharge: 2023-08-10 | Disposition: A | Discharge status: Home / Self Care | Source: Home / Self Care | Attending: Emergency Medicine | Admitting: Emergency Medicine

## 2023-08-10 ENCOUNTER — Other Ambulatory Visit: Payer: Self-pay | Admitting: Oncology

## 2023-08-10 ENCOUNTER — Encounter: Payer: Self-pay | Admitting: Gastroenterology

## 2023-08-10 ENCOUNTER — Other Ambulatory Visit: Payer: Self-pay | Admitting: *Deleted

## 2023-08-10 ENCOUNTER — Encounter
Admission: RE | Disposition: A | Discharge status: Home / Self Care | Payer: Self-pay | Source: Home / Self Care | Attending: Gastroenterology

## 2023-08-10 ENCOUNTER — Ambulatory Visit
Admission: RE | Admit: 2023-08-10 | Discharge: 2023-08-10 | Disposition: A | Discharge status: Home / Self Care | Attending: Gastroenterology | Admitting: Gastroenterology

## 2023-08-10 DIAGNOSIS — J449 Chronic obstructive pulmonary disease, unspecified: Secondary | ICD-10-CM | POA: Insufficient documentation

## 2023-08-10 DIAGNOSIS — K31819 Angiodysplasia of stomach and duodenum without bleeding: Secondary | ICD-10-CM | POA: Insufficient documentation

## 2023-08-10 DIAGNOSIS — Z853 Personal history of malignant neoplasm of breast: Secondary | ICD-10-CM | POA: Insufficient documentation

## 2023-08-10 DIAGNOSIS — D500 Iron deficiency anemia secondary to blood loss (chronic): Secondary | ICD-10-CM | POA: Diagnosis present

## 2023-08-10 DIAGNOSIS — Z7951 Long term (current) use of inhaled steroids: Secondary | ICD-10-CM | POA: Insufficient documentation

## 2023-08-10 DIAGNOSIS — D649 Anemia, unspecified: Secondary | ICD-10-CM

## 2023-08-10 DIAGNOSIS — D509 Iron deficiency anemia, unspecified: Secondary | ICD-10-CM

## 2023-08-10 DIAGNOSIS — F17210 Nicotine dependence, cigarettes, uncomplicated: Secondary | ICD-10-CM | POA: Diagnosis not present

## 2023-08-10 DIAGNOSIS — D62 Acute posthemorrhagic anemia: Secondary | ICD-10-CM

## 2023-08-10 DIAGNOSIS — Z923 Personal history of irradiation: Secondary | ICD-10-CM | POA: Insufficient documentation

## 2023-08-10 DIAGNOSIS — Q2730 Arteriovenous malformation, site unspecified: Secondary | ICD-10-CM

## 2023-08-10 DIAGNOSIS — I10 Essential (primary) hypertension: Secondary | ICD-10-CM | POA: Diagnosis not present

## 2023-08-10 DIAGNOSIS — K921 Melena: Secondary | ICD-10-CM

## 2023-08-10 HISTORY — PX: HOT HEMOSTASIS: SHX5433

## 2023-08-10 HISTORY — PX: ENTEROSCOPY: SHX5533

## 2023-08-10 LAB — PREPARE RBC (CROSSMATCH)

## 2023-08-10 LAB — CBC WITH DIFFERENTIAL/PLATELET
Basophils Absolute: 0 10*3/uL (ref 0.0–0.2)
Basos: 0 %
EOS (ABSOLUTE): 0.1 10*3/uL (ref 0.0–0.4)
Eos: 1 %
Hematocrit: 23.9 % — ABNORMAL LOW (ref 34.0–46.6)
Hemoglobin: 7.1 g/dL — ABNORMAL LOW (ref 11.1–15.9)
Immature Grans (Abs): 0 10*3/uL (ref 0.0–0.1)
Immature Granulocytes: 0 %
Lymphocytes Absolute: 0.8 10*3/uL (ref 0.7–3.1)
Lymphs: 16 %
MCH: 24.7 pg — ABNORMAL LOW (ref 26.6–33.0)
MCHC: 29.7 g/dL — ABNORMAL LOW (ref 31.5–35.7)
MCV: 83 fL (ref 79–97)
Monocytes Absolute: 0.4 10*3/uL (ref 0.1–0.9)
Monocytes: 7 %
Neutrophils Absolute: 3.8 10*3/uL (ref 1.4–7.0)
Neutrophils: 76 %
Platelets: 285 10*3/uL (ref 150–450)
RBC: 2.88 x10E6/uL — ABNORMAL LOW (ref 3.77–5.28)
RDW: 17.5 % — ABNORMAL HIGH (ref 11.7–15.4)
WBC: 5.1 10*3/uL (ref 3.4–10.8)

## 2023-08-10 LAB — BASIC METABOLIC PANEL
Anion gap: 6 (ref 5–15)
BUN: 17 mg/dL (ref 8–23)
CO2: 29 mmol/L (ref 22–32)
Calcium: 8.2 mg/dL — ABNORMAL LOW (ref 8.9–10.3)
Chloride: 109 mmol/L (ref 98–111)
Creatinine, Ser: 0.68 mg/dL (ref 0.44–1.00)
GFR, Estimated: >60 mL/min (ref >60)
Glucose, Bld: 104 mg/dL — ABNORMAL HIGH (ref 70–99)
Potassium: 3.7 mmol/L (ref 3.5–5.1)
Sodium: 144 mmol/L (ref 135–145)

## 2023-08-10 LAB — CBC
HCT: 24.2 % — ABNORMAL LOW (ref 36.0–46.0)
Hemoglobin: 7 g/dL — ABNORMAL LOW (ref 12.0–15.0)
MCH: 24.3 pg — ABNORMAL LOW (ref 26.0–34.0)
MCHC: 28.9 g/dL — ABNORMAL LOW (ref 30.0–36.0)
MCV: 84 fL (ref 80.0–100.0)
Platelets: 248 10*3/uL (ref 150–400)
RBC: 2.88 MIL/uL — ABNORMAL LOW (ref 3.87–5.11)
RDW: 21 % — ABNORMAL HIGH (ref 11.5–15.5)
WBC: 3.8 10*3/uL — ABNORMAL LOW (ref 4.0–10.5)
nRBC: 0 % (ref 0.0–0.2)

## 2023-08-10 LAB — HEMOGLOBIN: Hemoglobin: 6.7 g/dL — ABNORMAL LOW (ref 12.0–15.0)

## 2023-08-10 SURGERY — ENTEROSCOPY
Anesthesia: General

## 2023-08-10 MED ORDER — LIDOCAINE HCL (CARDIAC) PF 100 MG/5ML IV SOSY
PREFILLED_SYRINGE | INTRAVENOUS | Status: DC | PRN
  Administered 2023-08-10: 100 mg via INTRAVENOUS

## 2023-08-10 MED ORDER — SODIUM CHLORIDE 0.9 % IV SOLN
INTRAVENOUS | Status: DC
Start: 2023-08-10 — End: 2023-08-10

## 2023-08-10 MED ORDER — SODIUM CHLORIDE 0.9% IV SOLUTION
Freq: Once | INTRAVENOUS | Status: DC
  Filled 2023-08-10: qty 250

## 2023-08-10 MED ORDER — DEXMEDETOMIDINE HCL IN NACL 80 MCG/20ML IV SOLN
INTRAVENOUS | Status: DC | PRN
Start: 2023-08-10 — End: 2023-08-10
  Administered 2023-08-10: 4 ug via INTRAVENOUS

## 2023-08-10 MED ORDER — PROPOFOL 10 MG/ML IV BOLUS
INTRAVENOUS | Status: DC | PRN
  Administered 2023-08-10: 30 mg via INTRAVENOUS
  Administered 2023-08-10 (×2): 50 mg via INTRAVENOUS
  Administered 2023-08-10: 30 mg via INTRAVENOUS
  Administered 2023-08-10: 40 mg via INTRAVENOUS
  Administered 2023-08-10: 80 mg via INTRAVENOUS

## 2023-08-10 MED ORDER — GLYCOPYRROLATE 0.2 MG/ML IJ SOLN
INTRAMUSCULAR | Status: DC | PRN
  Administered 2023-08-10: .2 mg via INTRAVENOUS

## 2023-08-10 MED ORDER — GLYCOPYRROLATE 0.2 MG/ML IJ SOLN
INTRAMUSCULAR | Status: AC
  Filled 2023-08-10: qty 1

## 2023-08-10 NOTE — Progress Notes (Signed)
 Push enteroscopy postprocedure note  Hailey Farrell was seen in the office by me yesterday for ongoing melena and symptomatic anemia CBC from yesterday revealed drop in hemoglobin from 10.8 on 213 25-7.1 on 3/6 Scheduled urgent push enteroscopy today which revealed nonbleeding single AVM in the jejunum which was cauterized with APC.  She noticed her stools were turning brown overnight while doing bowel prep for the procedure.  There was no black stool or active bleeding during procedure today  Postprocedure hemoglobin in the PACU was 6.7  Therefore, recommend sending patient to the emergency room for blood transfusion and do not recommend any admission at this time given she is not having actively bleeding  She will follow-up with Dr. Orlie Dakin on Monday  Lannette Donath, MD

## 2023-08-10 NOTE — Transfer of Care (Signed)
 Immediate Anesthesia Transfer of Care Note  Patient: Hailey Farrell  Procedure(s) Performed: ENTEROSCOPY  Patient Location: PACU and Endoscopy Unit  Anesthesia Type:General  Level of Consciousness: Drowsy   Airway & Oxygen Therapy: Patient Spontanous Breathing  Post-op Assessment: Report given to RN and Post -op Vital signs reviewed and stable  Post vital signs: Reviewed and stable  Last Vitals:  Vitals Value Taken Time  BP 119/63 08/10/23 1205  Temp    Pulse 81 08/10/23 1206  Resp 18 08/10/23 1206  SpO2 99 % 08/10/23 1206  Vitals shown include unfiled device data.  Last Pain:  Vitals:   08/10/23 1111  TempSrc: Temporal  PainSc: 0-No pain         Complications: No notable events documented.

## 2023-08-10 NOTE — ED Notes (Signed)
 Pt was given [place] sandwich tray and meal tray was ordered.  Sprite given and lights dimmed per pt request

## 2023-08-10 NOTE — ED Triage Notes (Signed)
 Pt to ED from endo for blood transfusion. Hgb 6.7. rectal bleeding x1 week.

## 2023-08-10 NOTE — Op Note (Signed)
 [facility] Gastroenterology Patient Name: Hailey Farrell Procedure Date: 08/10/2023 11:35 AM MRN: [id number] Account #: [id number] Date of Birth: 21-Oct-1950 Admit Type: Outpatient Age: 73 Room: [facility] ENDO ROOM 3 Gender: Female Note Status: Finalized Instrument Name: Peds Colonoscope [id number] Procedure:             Small bowel enteroscopy Indications:           Iron deficiency anemia secondary to chronic blood                         loss, Melena, Obscure gastrointestinal bleeding,                         Arteriovenous malformation in the small intestine Providers:             Toney Reil MD, MD Referring MD:          Oswaldo Done. Hessie Diener MD, MD (Referring MD) Medicines:             General Anesthesia Complications:         No immediate complications. Estimated blood loss: None. Procedure:             Pre-Anesthesia Assessment:                        - Prior to the procedure, a History and Physical was                         performed, and patient medications and allergies were                         reviewed. The patient is competent. The risks and                         benefits of the procedure and the sedation options and                         risks were discussed with the patient. All questions                         were answered and informed consent was obtained.                         Patient identification and proposed procedure were                         verified by the physician, the nurse, the                         anesthesiologist, the anesthetist and the technician                         in the pre-procedure area in the procedure room in the                         endoscopy suite. Mental Status Examination: alert and                         oriented. Airway Examination: normal oropharyngeal  airway and neck mobility. Respiratory Examination:                         clear to auscultation. CV Examination: normal.                          Prophylactic Antibiotics: The patient does not require                         prophylactic antibiotics. Prior Anticoagulants: The                         patient has taken no anticoagulant or antiplatelet                         agents. ASA Grade Assessment: III - A patient with                         severe systemic disease. After reviewing the risks and                         benefits, the patient was deemed in satisfactory                         condition to undergo the procedure. The anesthesia                         plan was to use general anesthesia. Immediately prior                         to administration of medications, the patient was                         re-assessed for adequacy to receive sedatives. The                         heart rate, respiratory rate, oxygen saturations,                         blood pressure, adequacy of pulmonary ventilation, and                         response to care were monitored throughout the                         procedure. The physical status of the patient was                         re-assessed after the procedure.                        After obtaining informed consent, the endoscope was                         passed under direct vision. Throughout the procedure,                         the patient's blood pressure, pulse, and oxygen  saturations were monitored continuously. The                         Colonoscope was introduced through the mouth and                         advanced to the distal jejunum. The small bowel                         enteroscopy was accomplished without difficulty. The                         patient tolerated the procedure well. Findings:      A tattoo was seen in the distal jejunum that was marked previously to       note extent reached.      A single angiodysplastic lesion with no bleeding was found in the       mid-jejunum. Coagulation for hemostasis using  argon plasma was       successful. Estimated blood loss: none.      The examined duodenum was normal.      The esophagus was normal.      The stomach was normal. Impression:            - A tattoo was seen in the jejunum.                        - A single non-bleeding angiodysplastic lesion in the                         duodenum. Treated with argon plasma coagulation (APC).                        - Normal examined duodenum.                        - Normal esophagus.                        - Normal stomach.                        - No specimens collected. Recommendation:        - Check hemoglobin today.                        - Resume previous diet today. Procedure Code(s):     --- Professional ---                        [phone], Small intestinal endoscopy, enteroscopy beyond                         second portion of duodenum, not including ileum; with                         control of bleeding (eg, injection, bipolar cautery,                         unipolar cautery, laser, heater probe, stapler, plasma  coagulator) Diagnosis Code(s):     --- Professional ---                        K31.819, Angiodysplasia of stomach and duodenum                         without bleeding                        D50.0, Iron deficiency anemia secondary to blood loss                         (chronic)                        K92.1, Melena (includes Hematochezia)                        K92.2, Gastrointestinal hemorrhage, unspecified CPT copyright 2022 American Medical Association. All rights reserved. The codes documented in this report are preliminary and upon coder review may  be revised to meet current compliance requirements. Dr. Libby Maw Toney Reil MD, MD 08/10/2023 12:07:47 PM This report has been signed electronically. Number of Addenda: 0 Note Initiated On: 08/10/2023 11:35 AM Estimated Blood Loss:  Estimated blood loss: none.      [facility]

## 2023-08-10 NOTE — ED Notes (Signed)
 Pt signed paper consent.

## 2023-08-10 NOTE — Discharge Instructions (Signed)
 Follow-up with oncology on Monday and return to the ER for black stools, return of bleeding or any other concerns

## 2023-08-10 NOTE — ED Provider Notes (Signed)
 [facility] Provider Note    Event Date/Time   First MD Initiated Contact with Patient 08/10/23 1504     (approximate)   History   abnormal labs   HPI  Hailey Farrell is a 73 y.o. 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 She denies any abdominal pain. She is requesting something to eat.  She reports having this issue intermittently for years now and frequently needs to have intermittent blood transfusions, endoscopies for cauterization.  She denies any black stools today.  I read the note from Dr Allegra Lai posted today at 1243 post procedure pacu hemoglobin was 6.7 and recommended a unit of blood and can be discharged from hospital.  Given there was no active bleeding on endoscopy patient did not meet admission to the hospital    Physical Exam   Triage Vital Signs: ED Triage Vitals  Encounter Vitals Group     BP 08/10/23 1310 (!) 142/75     Systolic BP Percentile --      Diastolic BP Percentile --      Pulse Rate 08/10/23 1310 82     Resp 08/10/23 1310 16     Temp 08/10/23 1310 98 F (36.7 C)     Temp src --      SpO2 08/10/23 1310 96 %     Weight 08/10/23 1311 156 lb (70.8 kg)     Height 08/10/23 1311 5\' 3"  (1.6 m)     Head Circumference --      Peak Flow --      Pain Score 08/10/23 1311 5     Pain Loc --      Pain Education --      Exclude from Growth Chart --     Most recent vital signs: Vitals:   08/10/23 1310  BP: (!) 142/75  Pulse: 82  Resp: 16  Temp: 98 F (36.7 C)  SpO2: 96%     General: Awake, no distress.  CV:  Good peripheral perfusion.  Resp:  Normal effort.  Abd:  No distention.  Soft and nontender Other:  Declined rectal exam given recent endoscopy was reassuring without any active bleeding   ED Results / Procedures / Treatments   Labs (all labs ordered are listed, but only abnormal results are  displayed) Labs Reviewed  CBC - Abnormal; Notable for the following components:      Result Value   WBC 3.8 (*)    RBC 2.88 (*)    Hemoglobin 7.0 (*)    HCT 24.2 (*)    MCH 24.3 (*)    MCHC 28.9 (*)    RDW 21.0 (*)    All other components within normal limits  BASIC METABOLIC PANEL - Abnormal; Notable for the following components:   Glucose, Bld 104 (*)    Calcium 8.2 (*)    All other components within normal limits  TYPE AND SCREEN    RADIOLOGY   PROCEDURES:  Critical Care performed: Yes, see critical care procedure note(s)  .Critical Care  Performed by: Concha Se, MD Authorized by: Concha Se, MD   Critical care provider statement:    Critical care time (minutes):  30   Critical care was necessary to treat or prevent imminent or life-threatening deterioration of the following conditions: symptomatic anemia.   Critical care was time spent personally by me  on the following activities:  Development of treatment plan with patient or surrogate, discussions with consultants, evaluation of patient's response to treatment, examination of patient, ordering and review of laboratory studies, ordering and review of radiographic studies, ordering and performing treatments and interventions, pulse oximetry, re-evaluation of patient's condition and review of old charts    MEDICATIONS ORDERED IN ED: Medications  0.9 %  sodium chloride infusion (Manually program via Guardrails IV Fluids) (has no administration in time range)     IMPRESSION / MDM / ASSESSMENT AND PLAN / ED COURSE  I reviewed the triage vital signs and the nursing notes.   Patient's presentation is most consistent with acute presentation with potential threat to life or bodily function.   Patient comes in with concerns for low hemoglobin post procedure but at this time no active bleeding.  She reports having this happen for some time now.  She is requesting a unit of blood and to go home.  Hemoglobin here is 7  but when it was checked earlier it was 6.7 so I do feel like patient would benefit from 1 unit of blood.  She has no history of cardiac stent to require her baseline hemoglobin to be 8.  She has no active bleeding and I discussed the case with Dr. Allegra Lai who felt comfortable with patient going home after 1 unit of blood.  Patient also did not want to stay in the hospital overnight.  She states that she is dealt with this for many years if she does not develops any black stool that she will return to the ER for repeat evaluation.  She knows that she is got borderline hemoglobins and that if that did develop she would need to return emergently.  BMP is reassuring.  CBC shows hemoglobin of 7 with a check yesterday of 7.1 and earlier today of 6.7    FINAL CLINICAL IMPRESSION(S) / ED DIAGNOSES   Final diagnoses:  Symptomatic anemia     Rx / DC Orders   ED Discharge Orders     None        Note:  This document was prepared using Dragon voice recognition software and may include unintentional dictation errors.   Concha Se, MD 08/10/23 [phone]

## 2023-08-10 NOTE — H&P (Signed)
 Arlyss Repress, MD [address]  [address]  [place], [place] [address]  Main: [phone]  Fax: [phone] Pager: [phone]  Primary Care Physician:  Oswaldo Conroy, MD Primary Gastroenterologist:  Dr. Arlyss Repress  Pre-Procedure History & Physical: HPI:  Hailey Farrell is a 73 y.o. female is here for an Push enteroscopy.   Past Medical History:  Diagnosis Date   Anemia    Arthritis    Breast cancer (HCC) 1990   Right   COPD (chronic obstructive pulmonary disease) (HCC)    Dyspnea    DOE   Elevated lipids    Hypertension    Lower extremity edema    Migraines    Migraines    Personal history of radiation therapy 1990   Breast   Restless leg syndrome    Rotator cuff injury     Past Surgical History:  Procedure Laterality Date   ABDOMINAL HYSTERECTOMY     BACK SURGERY  08/2017   CERVICAL FUSION   BREAST BIOPSY Right 1990   LUMPECTOMY.  took lymph nodes   BREAST LUMPECTOMY     COLONOSCOPY WITH PROPOFOL N/A 07/20/2017   Procedure: COLONOSCOPY WITH PROPOFOL;  Surgeon: Pasty Spillers, MD;  Location: [facility] ENDOSCOPY;  Service: Endoscopy;  Laterality: N/A;   COLONOSCOPY WITH PROPOFOL N/A 01/27/2022   Procedure: COLONOSCOPY WITH PROPOFOL;  Surgeon: [name], MD;  Location: [facility] ENDOSCOPY;  Service: Gastroenterology;  Laterality: N/A;   ENTEROSCOPY N/A 08/08/2017   Procedure: ENTEROSCOPY;  Surgeon: Toney Reil, MD;  Location: [facility] ENDOSCOPY;  Service: Gastroenterology;  Laterality: N/A;   ENTEROSCOPY N/A 05/07/2020   Procedure: ENTEROSCOPY with Adult colonoscope;  Surgeon: Pasty Spillers, MD;  Location: [facility] ENDOSCOPY;  Service: Endoscopy;  Laterality: N/A;   ENTEROSCOPY N/A 03/17/2021   Procedure: ENTEROSCOPY;  Surgeon: Pasty Spillers, MD;  Location: [facility] ENDOSCOPY;  Service: Endoscopy;  Laterality: N/A;  PUSH   ENTEROSCOPY N/A 07/13/2021   Procedure: ENTEROSCOPY;  Surgeon: Toney Reil, MD;  Location: [facility] ENDOSCOPY;   Service: Gastroenterology;  Laterality: N/A;   ENTEROSCOPY N/A 10/26/2021   Procedure: ENTEROSCOPY;  Surgeon: Toney Reil, MD;  Location: [facility] ENDOSCOPY;  Service: Gastroenterology;  Laterality: N/A;  push   ENTEROSCOPY N/A 07/27/2022   Procedure: ENTEROSCOPY;  Surgeon: Midge Minium, MD;  Location: [facility] ENDOSCOPY;  Service: Endoscopy;  Laterality: N/A;  Push   ENTEROSCOPY N/A 06/29/2023   Procedure: ENTEROSCOPY;  Surgeon: [name], MD;  Location: [facility] ENDOSCOPY;  Service: Gastroenterology;  Laterality: N/A;  Push   ESOPHAGOGASTRODUODENOSCOPY Left 04/13/2017   Procedure: ESOPHAGOGASTRODUODENOSCOPY (EGD);  Surgeon: Pasty Spillers, MD;  Location: [facility] ENDOSCOPY;  Service: Endoscopy;  Laterality: Left;   ESOPHAGOGASTRODUODENOSCOPY (EGD) WITH PROPOFOL N/A 07/20/2017   Procedure: ESOPHAGOGASTRODUODENOSCOPY (EGD) WITH PROPOFOL;  Surgeon: Pasty Spillers, MD;  Location: [facility] ENDOSCOPY;  Service: Endoscopy;  Laterality: N/A;   ESOPHAGOGASTRODUODENOSCOPY (EGD) WITH PROPOFOL N/A 12/12/2017   Procedure: ESOPHAGOGASTRODUODENOSCOPY (EGD) WITH PROPOFOL;  Surgeon: Pasty Spillers, MD;  Location: [facility] ENDOSCOPY;  Service: Endoscopy;  Laterality: N/A;   ESOPHAGOGASTRODUODENOSCOPY (EGD) WITH PROPOFOL N/A 12/23/2020   Procedure: ESOPHAGOGASTRODUODENOSCOPY (EGD) WITH PROPOFOL;  Surgeon: Pasty Spillers, MD;  Location: [facility] ENDOSCOPY;  Service: Endoscopy;  Laterality: N/A;   ESOPHAGOGASTRODUODENOSCOPY (EGD) WITH PROPOFOL N/A 01/27/2022   Procedure: ESOPHAGOGASTRODUODENOSCOPY (EGD) WITH PROPOFOL;  Surgeon: [name], MD;  Location: [facility] ENDOSCOPY;  Service: Gastroenterology;  Laterality: N/A;   GIVENS CAPSULE STUDY N/A 03/10/2021   Procedure: GIVENS CAPSULE STUDY;  Surgeon: Melodie Bouillon  B, MD;  Location: [facility] ENDOSCOPY;  Service: Endoscopy;  Laterality: N/A;   HOT HEMOSTASIS  06/29/2023   Procedure: HOT HEMOSTASIS (ARGON PLASMA COAGULATION/BICAP);  Surgeon: [name], MD;  Location:  [facility] ENDOSCOPY;  Service: Gastroenterology;;   MEDIAL PARTIAL KNEE REPLACEMENT Left 1990   torn ligament. no knee replacement at that time   PARTIAL HYSTERECTOMY     SHOULDER ARTHROSCOPY WITH OPEN ROTATOR CUFF REPAIR Left 03/01/2016   Procedure: SHOULDER ARTHROSCOPY WITH OPEN ROTATOR CUFF REPAIR;  Surgeon: Juanell Fairly, MD;  Location: [facility] ORS;  Service: Orthopedics;  Laterality: Left;   TOTAL KNEE ARTHROPLASTY Left 02/14/2018   Procedure: TOTAL KNEE ARTHROPLASTY;  Surgeon: Juanell Fairly, MD;  Location: [facility] ORS;  Service: Orthopedics;  Laterality: Left;    Prior to Admission medications   Medication Sig Start Date End Date Taking? Authorizing Provider  amitriptyline (ELAVIL) 25 MG tablet Take 25 mg by mouth at bedtime. 04/13/20  Yes [provider]  budesonide-formoterol (SYMBICORT) 160-4.5 MCG/ACT inhaler Inhale 2 puffs into the lungs 2 (two) times daily.   Yes [provider]  furosemide (LASIX) 20 MG tablet Take 20 mg daily by mouth.    Yes [provider]  lisinopril (PRINIVIL,ZESTRIL) 20 MG tablet Take 20 mg by mouth daily.   Yes [provider]  potassium chloride (KLOR-CON M) 10 MEQ tablet Take 10 mEq by mouth daily. 05/14/23  Yes [provider]  rOPINIRole (REQUIP) 2 MG tablet Take 2-4 mg by mouth at bedtime as needed (restless leg syndrome).   Yes [provider]  simvastatin (ZOCOR) 20 MG tablet Take 20 mg by mouth at bedtime. 05/14/23  Yes [provider]  SPIRIVA HANDIHALER 18 MCG inhalation capsule Place 18 mcg into inhaler and inhale daily. 08/08/23  Yes [provider]  SUMAtriptan 6 MG/0.5ML SOAJ Inject into the skin. 06/20/23  Yes [provider]  vitamin B-12 (CYANOCOBALAMIN) 1000 MCG tablet Take 1,000 mcg by mouth daily.   Yes [provider]  albuterol (VENTOLIN HFA) 108 (90 Base) MCG/ACT inhaler Inhale 2 puffs into the lungs every 6 (six) hours as needed for wheezing.    [provider]  omeprazole (PRILOSEC) 40 MG capsule Take 1 capsule (40 mg total) by mouth daily. Patient not taking: Reported on 08/09/2023 07/18/23   [name], MD    Allergies as of 08/09/2023 - Review Complete 08/09/2023  Allergen Reaction Noted   Latex Rash 02/03/2015    Family History  Problem Relation Age of Onset   CAD Mother    CAD Sister    Breast cancer Neg Hx     Social History   Socioeconomic History   Marital status: Married    Spouse name: Peyton Najjar   Number of children: Not on file   Years of education: Not on file   Highest education level: Not on file  Occupational History   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  Tobacco Use   Smoking status: Some Days    Current packs/day: 0.00    Average packs/day: 0.3 packs/day for 50.0 years (12.5 ttl pk-yrs)    Types: Cigarettes    Start date: 03/13/1966    Last attempt to quit: 03/13/2016    Years since quitting: 7.4   Smokeless tobacco: Never  Vaping Use   Vaping status: Never Used  Substance and Sexual Activity   Alcohol use: No   Drug use: No   Sexual activity: Not on file  Other Topics Concern  Not on file  Social History Narrative   Not on file   Social Drivers of Health   Financial Resource Strain: Not on file  Food Insecurity: Not on file  Transportation Needs: Not on file  Physical Activity: Not on file  Stress: Not on file  Social Connections: Not on file  Intimate Partner Violence: Not on file    Review of Systems: See HPI, otherwise negative ROS  Physical Exam: BP 120/72   Pulse 79   Temp 98.2 F (36.8 C) (Temporal)   Resp 18   Ht 5\' 4"  (1.626 m)   Wt 68.9 kg   SpO2 99%   BMI 26.09 kg/m  General:   Alert,  pleasant and cooperative in NAD Head:  Normocephalic and atraumatic. Neck:  Supple; no masses or thyromegaly. Lungs:  Clear throughout to auscultation.    Heart:  Regular rate and rhythm. Abdomen:  Soft, nontender and nondistended. Normal bowel sounds, without  guarding, and without rebound.   Neurologic:  Alert and  oriented x4;  grossly normal neurologically.  Impression/Plan: NIKERIA KALMAN is here for a push enteroscopy to be performed for melena, acute blood loss anemia  Risks, benefits, limitations, and alternatives regarding  push enteroscopy have been reviewed with the patient.  Questions have been answered.  All parties agreeable.   Lannette Donath, MD  08/10/2023, 11:37 AM

## 2023-08-10 NOTE — ED Notes (Signed)
 Pt signed paper consent and first shift RN witnessed.

## 2023-08-10 NOTE — Anesthesia Preprocedure Evaluation (Signed)
 Anesthesia Evaluation  Patient identified by MRN, date of birth, ID band Patient awake    Reviewed: Allergy & Precautions, NPO status , Patient's Chart, lab work & pertinent test results  History of Anesthesia Complications Negative for: history of anesthetic complications  Airway Mallampati: II  TM Distance: >3 FB Neck ROM: Full    Dental  (+) Partial Lower, Partial Upper   Pulmonary neg sleep apnea, COPD, Current Smoker and Patient abstained from smoking.   Pulmonary exam normal breath sounds clear to auscultation       Cardiovascular Exercise Tolerance: Good METShypertension, Pt. on medications (-) CAD and (-) Past MI (-) dysrhythmias  Rhythm:Regular Rate:Normal - Systolic murmurs    Neuro/Psych  Headaches  negative psych ROS   GI/Hepatic hiatal hernia,neg GERD  ,,(+)     (-) substance abuse    Endo/Other  neg diabetes    Renal/GU negative Renal ROS     Musculoskeletal   Abdominal   Peds  Hematology  (+) Blood dyscrasia, anemia   Anesthesia Other Findings Past Medical History: No date: Anemia No date: Arthritis 1990: Breast cancer (HCC)     Comment:  Right No date: COPD (chronic obstructive pulmonary disease) (HCC) No date: Dyspnea     Comment:  DOE No date: Elevated lipids No date: Hypertension No date: Lower extremity edema No date: Migraines No date: Migraines 1990: Personal history of radiation therapy     Comment:  Breast No date: Restless leg syndrome No date: Rotator cuff injury  Reproductive/Obstetrics                             Anesthesia Physical Anesthesia Plan  ASA: 3  Anesthesia Plan: General   Post-op Pain Management: Minimal or no pain anticipated   Induction: Intravenous  PONV Risk Score and Plan: 2 and Propofol infusion, TIVA and Ondansetron  Airway Management Planned: Nasal Cannula  Additional Equipment: None  Intra-op Plan:    Post-operative Plan:   Informed Consent: I have reviewed the patients History and Physical, chart, labs and discussed the procedure including the risks, benefits and alternatives for the proposed anesthesia with the patient or authorized representative who has indicated his/her understanding and acceptance.     Dental advisory given  Plan Discussed with: CRNA and Surgeon  Anesthesia Plan Comments: (Discussed risks of anesthesia with patient, including possibility of difficulty with spontaneous ventilation under anesthesia necessitating airway intervention, PONV, and rare risks such as cardiac or respiratory or neurological events, and allergic reactions. Discussed the role of CRNA in patient's perioperative care. Patient understands. Patient counseled on benefits of smoking cessation, and increased perioperative risks associated with continued smoking. )       Anesthesia Quick Evaluation

## 2023-08-11 LAB — TYPE AND SCREEN
ABO/RH(D): O POS
Antibody Screen: NEGATIVE
Unit division: 0

## 2023-08-11 LAB — BPAM RBC
Blood Product Expiration Date: 202503272359
ISSUE DATE / TIME: 202503071606
Unit Type and Rh: 5100

## 2023-08-13 ENCOUNTER — Telehealth: Payer: Self-pay

## 2023-08-13 ENCOUNTER — Encounter: Payer: Self-pay | Admitting: Oncology

## 2023-08-13 ENCOUNTER — Inpatient Hospital Stay

## 2023-08-13 ENCOUNTER — Ambulatory Visit: Admitting: Oncology

## 2023-08-13 ENCOUNTER — Inpatient Hospital Stay: Attending: Oncology | Admitting: Oncology

## 2023-08-13 ENCOUNTER — Ambulatory Visit

## 2023-08-13 VITALS — BP 131/80 | HR 85 | Temp 97.1°F | Resp 16 | Ht 63.0 in | Wt 156.3 lb

## 2023-08-13 DIAGNOSIS — D649 Anemia, unspecified: Secondary | ICD-10-CM | POA: Diagnosis not present

## 2023-08-13 DIAGNOSIS — D509 Iron deficiency anemia, unspecified: Secondary | ICD-10-CM | POA: Insufficient documentation

## 2023-08-13 DIAGNOSIS — F17210 Nicotine dependence, cigarettes, uncomplicated: Secondary | ICD-10-CM | POA: Diagnosis not present

## 2023-08-13 DIAGNOSIS — Z79899 Other long term (current) drug therapy: Secondary | ICD-10-CM | POA: Insufficient documentation

## 2023-08-13 LAB — CBC WITH DIFFERENTIAL/PLATELET
Abs Immature Granulocytes: 0.02 10*3/uL (ref 0.00–0.07)
Basophils Absolute: 0 10*3/uL (ref 0.0–0.1)
Basophils Relative: 0 %
Eosinophils Absolute: 0 10*3/uL (ref 0.0–0.5)
Eosinophils Relative: 1 %
HCT: 27.2 % — ABNORMAL LOW (ref 36.0–46.0)
Hemoglobin: 8.1 g/dL — ABNORMAL LOW (ref 12.0–15.0)
Immature Granulocytes: 0 %
Lymphocytes Relative: 18 %
Lymphs Abs: 0.9 10*3/uL (ref 0.7–4.0)
MCH: 24.2 pg — ABNORMAL LOW (ref 26.0–34.0)
MCHC: 29.8 g/dL — ABNORMAL LOW (ref 30.0–36.0)
MCV: 81.2 fL (ref 80.0–100.0)
Monocytes Absolute: 0.4 10*3/uL (ref 0.1–1.0)
Monocytes Relative: 7 %
Neutro Abs: 3.5 10*3/uL (ref 1.7–7.7)
Neutrophils Relative %: 74 %
Platelets: 281 10*3/uL (ref 150–400)
RBC: 3.35 MIL/uL — ABNORMAL LOW (ref 3.87–5.11)
RDW: 19.4 % — ABNORMAL HIGH (ref 11.5–15.5)
WBC: 4.8 10*3/uL (ref 4.0–10.5)
nRBC: 0 % (ref 0.0–0.2)

## 2023-08-13 LAB — SAMPLE TO BLOOD BANK

## 2023-08-13 NOTE — Telephone Encounter (Signed)
 Her hemoglobin is 8.1 today, hopefully her bleeding stopped  RV

## 2023-08-13 NOTE — Telephone Encounter (Signed)
 Per Dr. Allegra Lai Can you try to reach out to her, if she is doing ok, no more black stools and, if she is going to see Dr Orlie Dakin today?   Called patient and patient states she has not had a bowel movement since the procedure so she has not had any black stools. She states she is on her way now to have blood work and see Dr. Orlie Dakin.

## 2023-08-13 NOTE — Progress Notes (Signed)
 [facility]  Telephone:(336) [phone] Fax:(336) [phone]  ID: Hailey Farrell OB: 07-09-1950  MR#: [id number]  CSN#:[id number]  Patient Care Team: Oswaldo Conroy, MD as PCP - General (Family Medicine) Jeralyn Ruths, MD as Consulting Physician (Oncology)  CHIEF COMPLAINT: Iron deficiency anemia  INTERVAL HISTORY: Patient returns to clinic today for repeat laboratory, further evaluation, consideration of blood transfusion.  She underwent EGD last week which revealed multiple AVMs requiring cauterization.  She also went to the emergency room after her procedure to receive 1 unit of packed red blood cells.  She currently feels well.  She does not complain of any weakness or fatigue today. She has no neurologic complaints. She denies any recent fevers or illnesses.  She has a fair appetite.  She denies any chest pain, shortness of breath, cough, or hemoptysis.  She denies any nausea, vomiting, constipation, or diarrhea.  She has noticed no melena or hematochezia.  She has no urinary complaints.  Patient offers no specific complaints today.  REVIEW OF SYSTEMS:   Review of Systems  Constitutional: Negative.  Negative for fever, malaise/fatigue and weight loss.  Respiratory: Negative.  Negative for cough and shortness of breath.   Cardiovascular: Negative.  Negative for chest pain and leg swelling.  Gastrointestinal: Negative.  Negative for abdominal pain, blood in stool and melena.  Genitourinary: Negative.  Negative for hematuria.  Musculoskeletal: Negative.  Negative for back pain.  Skin: Negative.  Negative for rash.  Neurological: Negative.  Negative for dizziness, focal weakness, weakness and headaches.  Psychiatric/Behavioral: Negative.  The patient is not nervous/anxious.     As per HPI. Otherwise, a complete review of systems is negative.  PAST MEDICAL HISTORY: Past Medical History:  Diagnosis Date   Anemia    Arthritis    Breast cancer (HCC) 1990    Right   COPD (chronic obstructive pulmonary disease) (HCC)    Dyspnea    DOE   Elevated lipids    Hypertension    Lower extremity edema    Migraines    Migraines    Personal history of radiation therapy 1990   Breast   Restless leg syndrome    Rotator cuff injury     PAST SURGICAL HISTORY: Past Surgical History:  Procedure Laterality Date   ABDOMINAL HYSTERECTOMY     BACK SURGERY  08/2017   CERVICAL FUSION   BREAST BIOPSY Right 1990   LUMPECTOMY.  took lymph nodes   BREAST LUMPECTOMY     COLONOSCOPY WITH PROPOFOL N/A 07/20/2017   Procedure: COLONOSCOPY WITH PROPOFOL;  Surgeon: Pasty Spillers, MD;  Location: [facility] ENDOSCOPY;  Service: Endoscopy;  Laterality: N/A;   COLONOSCOPY WITH PROPOFOL N/A 01/27/2022   Procedure: COLONOSCOPY WITH PROPOFOL;  Surgeon: [name], MD;  Location: [facility] ENDOSCOPY;  Service: Gastroenterology;  Laterality: N/A;   ENTEROSCOPY N/A 08/08/2017   Procedure: ENTEROSCOPY;  Surgeon: Toney Reil, MD;  Location: [facility] ENDOSCOPY;  Service: Gastroenterology;  Laterality: N/A;   ENTEROSCOPY N/A 05/07/2020   Procedure: ENTEROSCOPY with Adult colonoscope;  Surgeon: Pasty Spillers, MD;  Location: [facility] ENDOSCOPY;  Service: Endoscopy;  Laterality: N/A;   ENTEROSCOPY N/A 03/17/2021   Procedure: ENTEROSCOPY;  Surgeon: Pasty Spillers, MD;  Location: [facility] ENDOSCOPY;  Service: Endoscopy;  Laterality: N/A;  PUSH   ENTEROSCOPY N/A 07/13/2021   Procedure: ENTEROSCOPY;  Surgeon: Toney Reil, MD;  Location: [facility] ENDOSCOPY;  Service: Gastroenterology;  Laterality: N/A;   ENTEROSCOPY N/A 10/26/2021   Procedure: ENTEROSCOPY;  Surgeon: Allegra Lai,  Loel Dubonnet, MD;  Location: [facility] ENDOSCOPY;  Service: Gastroenterology;  Laterality: N/A;  push   ENTEROSCOPY N/A 07/27/2022   Procedure: ENTEROSCOPY;  Surgeon: Midge Minium, MD;  Location: [facility] ENDOSCOPY;  Service: Endoscopy;  Laterality: N/A;  Push   ENTEROSCOPY N/A 06/29/2023   Procedure: ENTEROSCOPY;   Surgeon: [name], MD;  Location: [facility] ENDOSCOPY;  Service: Gastroenterology;  Laterality: N/A;  Push   ENTEROSCOPY N/A 08/10/2023   Procedure: ENTEROSCOPY;  Surgeon: Toney Reil, MD;  Location: [facility] ENDOSCOPY;  Service: Gastroenterology;  Laterality: N/A;  Push   ESOPHAGOGASTRODUODENOSCOPY Left 04/13/2017   Procedure: ESOPHAGOGASTRODUODENOSCOPY (EGD);  Surgeon: Pasty Spillers, MD;  Location: [facility] ENDOSCOPY;  Service: Endoscopy;  Laterality: Left;   ESOPHAGOGASTRODUODENOSCOPY (EGD) WITH PROPOFOL N/A 07/20/2017   Procedure: ESOPHAGOGASTRODUODENOSCOPY (EGD) WITH PROPOFOL;  Surgeon: Pasty Spillers, MD;  Location: [facility] ENDOSCOPY;  Service: Endoscopy;  Laterality: N/A;   ESOPHAGOGASTRODUODENOSCOPY (EGD) WITH PROPOFOL N/A 12/12/2017   Procedure: ESOPHAGOGASTRODUODENOSCOPY (EGD) WITH PROPOFOL;  Surgeon: Pasty Spillers, MD;  Location: [facility] ENDOSCOPY;  Service: Endoscopy;  Laterality: N/A;   ESOPHAGOGASTRODUODENOSCOPY (EGD) WITH PROPOFOL N/A 12/23/2020   Procedure: ESOPHAGOGASTRODUODENOSCOPY (EGD) WITH PROPOFOL;  Surgeon: Pasty Spillers, MD;  Location: [facility] ENDOSCOPY;  Service: Endoscopy;  Laterality: N/A;   ESOPHAGOGASTRODUODENOSCOPY (EGD) WITH PROPOFOL N/A 01/27/2022   Procedure: ESOPHAGOGASTRODUODENOSCOPY (EGD) WITH PROPOFOL;  Surgeon: [name], MD;  Location: [facility] ENDOSCOPY;  Service: Gastroenterology;  Laterality: N/A;   GIVENS CAPSULE STUDY N/A 03/10/2021   Procedure: GIVENS CAPSULE STUDY;  Surgeon: Pasty Spillers, MD;  Location: [facility] ENDOSCOPY;  Service: Endoscopy;  Laterality: N/A;   HOT HEMOSTASIS  06/29/2023   Procedure: HOT HEMOSTASIS (ARGON PLASMA COAGULATION/BICAP);  Surgeon: [name], MD;  Location: [facility] ENDOSCOPY;  Service: Gastroenterology;;   HOT HEMOSTASIS  08/10/2023   Procedure: EGD, WITH ARGON PLASMA COAGULATION;  Surgeon: Toney Reil, MD;  Location: [facility] ENDOSCOPY;  Service: Gastroenterology;;   MEDIAL PARTIAL KNEE REPLACEMENT Left 1990    torn ligament. no knee replacement at that time   PARTIAL HYSTERECTOMY     SHOULDER ARTHROSCOPY WITH OPEN ROTATOR CUFF REPAIR Left 03/01/2016   Procedure: SHOULDER ARTHROSCOPY WITH OPEN ROTATOR CUFF REPAIR;  Surgeon: Juanell Fairly, MD;  Location: [facility] ORS;  Service: Orthopedics;  Laterality: Left;   TOTAL KNEE ARTHROPLASTY Left 02/14/2018   Procedure: TOTAL KNEE ARTHROPLASTY;  Surgeon: Juanell Fairly, MD;  Location: [facility] ORS;  Service: Orthopedics;  Laterality: Left;    FAMILY HISTORY: Family History  Problem Relation Age of Onset   CAD Mother    CAD Sister    Breast cancer Neg Hx     ADVANCED DIRECTIVES (Y/N):  N  HEALTH MAINTENANCE: Social History   Tobacco Use   Smoking status: Some Days    Current packs/day: 0.00    Average packs/day: 0.3 packs/day for 50.0 years (12.5 ttl pk-yrs)    Types: Cigarettes    Start date: 03/13/1966    Last attempt to quit: 03/13/2016    Years since quitting: 7.4   Smokeless tobacco: Never  Vaping Use   Vaping status: Never Used  Substance Use Topics   Alcohol use: No   Drug use: No     Colonoscopy:  PAP:  Bone density:  Lipid panel:  Allergies  Allergen Reactions   Latex Rash    Welts over body    Current Outpatient Medications  Medication Sig Dispense Refill   albuterol (VENTOLIN HFA) 108 (90 Base) MCG/ACT inhaler Inhale 2 puffs into the lungs every 6 (six) hours  as needed for wheezing.     amitriptyline (ELAVIL) 25 MG tablet Take 25 mg by mouth at bedtime.     budesonide-formoterol (SYMBICORT) 160-4.5 MCG/ACT inhaler Inhale 2 puffs into the lungs 2 (two) times daily.     furosemide (LASIX) 20 MG tablet Take 20 mg daily by mouth.      lisinopril (PRINIVIL,ZESTRIL) 20 MG tablet Take 20 mg by mouth daily.     potassium chloride (KLOR-CON M) 10 MEQ tablet Take 10 mEq by mouth daily.     rOPINIRole (REQUIP) 2 MG tablet Take 2-4 mg by mouth at bedtime as needed (restless leg syndrome).     simvastatin (ZOCOR) 20 MG tablet Take  20 mg by mouth at bedtime.     SPIRIVA HANDIHALER 18 MCG inhalation capsule Place 18 mcg into inhaler and inhale daily.     SUMAtriptan 6 MG/0.5ML SOAJ Inject into the skin.     vitamin B-12 (CYANOCOBALAMIN) 1000 MCG tablet Take 1,000 mcg by mouth daily.     omeprazole (PRILOSEC) 40 MG capsule Take 1 capsule (40 mg total) by mouth daily. (Patient not taking: Reported on 08/13/2023) 90 capsule 3   No current facility-administered medications for this visit.    OBJECTIVE: Vitals:   08/13/23 0925  BP: 131/80  Pulse: 85  Resp: 16  Temp: (!) 97.1 F (36.2 C)  SpO2: 100%      Body mass index is 27.69 kg/m.    ECOG FS:0 - Asymptomatic  General: Well-developed, well-nourished, no acute distress. Eyes: Pink conjunctiva, anicteric sclera. HEENT: Normocephalic, moist mucous membranes. Lungs: No audible wheezing or coughing. Heart: Regular rate and rhythm. Abdomen: Soft, nontender, no obvious distention. Musculoskeletal: No edema, cyanosis, or clubbing. Neuro: Alert, answering all questions appropriately. Cranial nerves grossly intact. Skin: No rashes or petechiae noted. Psych: Normal affect.  LAB RESULTS:  Lab Results  Component Value Date   NA 144 08/10/2023   K 3.7 08/10/2023   CL 109 08/10/2023   CO2 29 08/10/2023   GLUCOSE 104 (H) 08/10/2023   BUN 17 08/10/2023   CREATININE 0.68 08/10/2023   CALCIUM 8.2 (L) 08/10/2023   PROT 5.7 (L) 06/13/2023   ALBUMIN 3.3 (L) 06/13/2023   AST 17 06/13/2023   ALT 13 06/13/2023   ALKPHOS 28 (L) 06/13/2023   BILITOT 0.4 06/13/2023   GFRNONAA >60 08/10/2023   GFRAA >60 02/16/2018    Lab Results  Component Value Date   WBC 4.8 08/13/2023   NEUTROABS 3.5 08/13/2023   HGB 8.1 (L) 08/13/2023   HCT 27.2 (L) 08/13/2023   MCV 81.2 08/13/2023   PLT 281 08/13/2023   Lab Results  Component Value Date   IRON 21 (L) 04/17/2023   TIBC 427 04/17/2023   IRONPCTSAT 5 (L) 04/17/2023   Lab Results  Component Value Date   FERRITIN 24  04/17/2023     STUDIES: No results found.    ASSESSMENT: Iron deficiency anemia.  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  Her hemoglobin had decreased, so she also received 1 unit packed red blood cells in the ED that day.  Hemoglobin improved to 8.1 and patient does not wish to have transfusion tomorrow since it is her birthday.  GI has recommended increasing octreotide to once every 2 weeks, therefore she will return to clinic in 1 week for repeat laboratory work, continuation  of treatment, and consideration of transfusion.  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  The dose of thalidomide is 100 mg daily for a total of 4 months. History of AVMs: EGD and octreotide as above.  Continue follow-up with GI as scheduled.   Tendon repair: Patient recently tore a tendon in her right arm, but is unclear if and when she will need surgical repair. Coping/adjustment: Patient was previously given a referral to [facility].  I spent a total of 30 minutes reviewing chart data, face-to-face evaluation with the patient, counseling and coordination of care as detailed above.   Patient expressed understanding and was in agreement with this plan. She also understands that She can call clinic at any time with any questions, concerns, or complaints.    Jeralyn Ruths, MD   08/13/2023 9:51 AM

## 2023-08-14 ENCOUNTER — Inpatient Hospital Stay

## 2023-08-14 NOTE — Anesthesia Postprocedure Evaluation (Signed)
 Anesthesia Post Note  Patient: DAISHIA FETTERLY  Procedure(s) Performed: ENTEROSCOPY EGD, WITH ARGON PLASMA COAGULATION  Patient location during evaluation: PACU Anesthesia Type: General Level of consciousness: awake and alert Pain management: pain level controlled Vital Signs Assessment: post-procedure vital signs reviewed and stable Respiratory status: spontaneous breathing, nonlabored ventilation, respiratory function stable and patient connected to nasal cannula oxygen Cardiovascular status: blood pressure returned to baseline and stable Postop Assessment: no apparent nausea or vomiting Anesthetic complications: no   No notable events documented.   Last Vitals:  Vitals:   08/10/23 1239 08/10/23 1249  BP: (!) 148/81 (!) 148/89  Pulse: 83 88  Resp:    Temp:    SpO2:      Last Pain:  Vitals:   08/13/23 0755  TempSrc:   PainSc: 0-No pain                 Yevette Edwards

## 2023-08-16 ENCOUNTER — Other Ambulatory Visit: Payer: 59

## 2023-08-16 ENCOUNTER — Ambulatory Visit: Payer: 59 | Admitting: Oncology

## 2023-08-16 ENCOUNTER — Ambulatory Visit: Payer: 59

## 2023-08-20 ENCOUNTER — Inpatient Hospital Stay (HOSPITAL_BASED_OUTPATIENT_CLINIC_OR_DEPARTMENT_OTHER): Admitting: Oncology

## 2023-08-20 ENCOUNTER — Inpatient Hospital Stay

## 2023-08-20 ENCOUNTER — Encounter: Payer: Self-pay | Admitting: Oncology

## 2023-08-20 VITALS — BP 134/92 | HR 70 | Temp 96.1°F | Resp 16 | Ht 63.0 in | Wt 154.5 lb

## 2023-08-20 DIAGNOSIS — D649 Anemia, unspecified: Secondary | ICD-10-CM | POA: Diagnosis not present

## 2023-08-20 DIAGNOSIS — D509 Iron deficiency anemia, unspecified: Secondary | ICD-10-CM

## 2023-08-20 LAB — CBC WITH DIFFERENTIAL/PLATELET
Abs Immature Granulocytes: 0.01 10*3/uL (ref 0.00–0.07)
Basophils Absolute: 0 10*3/uL (ref 0.0–0.1)
Basophils Relative: 0 %
Eosinophils Absolute: 0 10*3/uL (ref 0.0–0.5)
Eosinophils Relative: 0 %
HCT: 28.4 % — ABNORMAL LOW (ref 36.0–46.0)
Hemoglobin: 8.3 g/dL — ABNORMAL LOW (ref 12.0–15.0)
Immature Granulocytes: 0 %
Lymphocytes Relative: 21 %
Lymphs Abs: 0.9 10*3/uL (ref 0.7–4.0)
MCH: 24.1 pg — ABNORMAL LOW (ref 26.0–34.0)
MCHC: 29.2 g/dL — ABNORMAL LOW (ref 30.0–36.0)
MCV: 82.3 fL (ref 80.0–100.0)
Monocytes Absolute: 0.4 10*3/uL (ref 0.1–1.0)
Monocytes Relative: 8 %
Neutro Abs: 3.2 10*3/uL (ref 1.7–7.7)
Neutrophils Relative %: 71 %
Platelets: 429 10*3/uL — ABNORMAL HIGH (ref 150–400)
RBC: 3.45 MIL/uL — ABNORMAL LOW (ref 3.87–5.11)
RDW: 19.9 % — ABNORMAL HIGH (ref 11.5–15.5)
WBC: 4.5 10*3/uL (ref 4.0–10.5)
nRBC: 0 % (ref 0.0–0.2)

## 2023-08-20 LAB — PREPARE RBC (CROSSMATCH)

## 2023-08-20 LAB — SAMPLE TO BLOOD BANK

## 2023-08-20 MED ORDER — OCTREOTIDE ACETATE 30 MG IM KIT
30.0000 mg | PACK | Freq: Once | INTRAMUSCULAR | Status: AC
  Administered 2023-08-20: 30 mg via INTRAMUSCULAR
  Filled 2023-08-20: qty 1

## 2023-08-20 NOTE — Progress Notes (Signed)
 [facility]  Telephone:(336) [phone] Fax:(336) [phone]  ID: Hailey Farrell OB: December 22, 1950  MR#: [id number]  AOZ#:[id number]  Patient Care Team: Oswaldo Conroy, MD as PCP - General (Family Medicine) Jeralyn Ruths, MD as Consulting Physician (Oncology)  CHIEF COMPLAINT: Iron deficiency anemia secondary to AVM/GI bleed.  INTERVAL HISTORY: Patient returns to clinic today for repeat laboratory work, further evaluation, octreotide, and consideration of blood transfusion.  She currently feels well.  She does not complain of any weakness or fatigue today. She has no neurologic complaints. She denies any recent fevers or illnesses.  She has a fair appetite.  She denies any chest pain, shortness of breath, cough, or hemoptysis.  She denies any nausea, vomiting, constipation, or diarrhea.  She has noticed no melena or hematochezia.  She has no urinary complaints.  Patient offers no specific complaints today.  REVIEW OF SYSTEMS:   Review of Systems  Constitutional: Negative.  Negative for fever, malaise/fatigue and weight loss.  Respiratory: Negative.  Negative for cough and shortness of breath.   Cardiovascular: Negative.  Negative for chest pain and leg swelling.  Gastrointestinal: Negative.  Negative for abdominal pain, blood in stool and melena.  Genitourinary: Negative.  Negative for hematuria.  Musculoskeletal: Negative.  Negative for back pain.  Skin: Negative.  Negative for rash.  Neurological: Negative.  Negative for dizziness, focal weakness, weakness and headaches.  Psychiatric/Behavioral: Negative.  The patient is not nervous/anxious.     As per HPI. Otherwise, a complete review of systems is negative.  PAST MEDICAL HISTORY: Past Medical History:  Diagnosis Date   Anemia    Arthritis    Breast cancer (HCC) 1990   Right   COPD (chronic obstructive pulmonary disease) (HCC)    Dyspnea    DOE   Elevated lipids    Hypertension    Lower extremity  edema    Migraines    Migraines    Personal history of radiation therapy 1990   Breast   Restless leg syndrome    Rotator cuff injury     PAST SURGICAL HISTORY: Past Surgical History:  Procedure Laterality Date   ABDOMINAL HYSTERECTOMY     BACK SURGERY  08/2017   CERVICAL FUSION   BREAST BIOPSY Right 1990   LUMPECTOMY.  took lymph nodes   BREAST LUMPECTOMY     COLONOSCOPY WITH PROPOFOL N/A 07/20/2017   Procedure: COLONOSCOPY WITH PROPOFOL;  Surgeon: Pasty Spillers, MD;  Location: [facility] ENDOSCOPY;  Service: Endoscopy;  Laterality: N/A;   COLONOSCOPY WITH PROPOFOL N/A 01/27/2022   Procedure: COLONOSCOPY WITH PROPOFOL;  Surgeon: [name], MD;  Location: [facility] ENDOSCOPY;  Service: Gastroenterology;  Laterality: N/A;   ENTEROSCOPY N/A 08/08/2017   Procedure: ENTEROSCOPY;  Surgeon: Toney Reil, MD;  Location: [facility] ENDOSCOPY;  Service: Gastroenterology;  Laterality: N/A;   ENTEROSCOPY N/A 05/07/2020   Procedure: ENTEROSCOPY with Adult colonoscope;  Surgeon: Pasty Spillers, MD;  Location: [facility] ENDOSCOPY;  Service: Endoscopy;  Laterality: N/A;   ENTEROSCOPY N/A 03/17/2021   Procedure: ENTEROSCOPY;  Surgeon: Pasty Spillers, MD;  Location: [facility] ENDOSCOPY;  Service: Endoscopy;  Laterality: N/A;  PUSH   ENTEROSCOPY N/A 07/13/2021   Procedure: ENTEROSCOPY;  Surgeon: Toney Reil, MD;  Location: [facility] ENDOSCOPY;  Service: Gastroenterology;  Laterality: N/A;   ENTEROSCOPY N/A 10/26/2021   Procedure: ENTEROSCOPY;  Surgeon: Toney Reil, MD;  Location: [facility] ENDOSCOPY;  Service: Gastroenterology;  Laterality: N/A;  push   ENTEROSCOPY N/A 07/27/2022   Procedure: ENTEROSCOPY;  Surgeon: Midge Minium, MD;  Location: [facility] ENDOSCOPY;  Service: Endoscopy;  Laterality: N/A;  Push   ENTEROSCOPY N/A 06/29/2023   Procedure: ENTEROSCOPY;  Surgeon: [name], MD;  Location: [facility] ENDOSCOPY;  Service: Gastroenterology;  Laterality: N/A;  Push   ENTEROSCOPY N/A 08/10/2023    Procedure: ENTEROSCOPY;  Surgeon: Toney Reil, MD;  Location: [facility] ENDOSCOPY;  Service: Gastroenterology;  Laterality: N/A;  Push   ESOPHAGOGASTRODUODENOSCOPY Left 04/13/2017   Procedure: ESOPHAGOGASTRODUODENOSCOPY (EGD);  Surgeon: Pasty Spillers, MD;  Location: [facility] ENDOSCOPY;  Service: Endoscopy;  Laterality: Left;   ESOPHAGOGASTRODUODENOSCOPY (EGD) WITH PROPOFOL N/A 07/20/2017   Procedure: ESOPHAGOGASTRODUODENOSCOPY (EGD) WITH PROPOFOL;  Surgeon: Pasty Spillers, MD;  Location: [facility] ENDOSCOPY;  Service: Endoscopy;  Laterality: N/A;   ESOPHAGOGASTRODUODENOSCOPY (EGD) WITH PROPOFOL N/A 12/12/2017   Procedure: ESOPHAGOGASTRODUODENOSCOPY (EGD) WITH PROPOFOL;  Surgeon: Pasty Spillers, MD;  Location: [facility] ENDOSCOPY;  Service: Endoscopy;  Laterality: N/A;   ESOPHAGOGASTRODUODENOSCOPY (EGD) WITH PROPOFOL N/A 12/23/2020   Procedure: ESOPHAGOGASTRODUODENOSCOPY (EGD) WITH PROPOFOL;  Surgeon: Pasty Spillers, MD;  Location: [facility] ENDOSCOPY;  Service: Endoscopy;  Laterality: N/A;   ESOPHAGOGASTRODUODENOSCOPY (EGD) WITH PROPOFOL N/A 01/27/2022   Procedure: ESOPHAGOGASTRODUODENOSCOPY (EGD) WITH PROPOFOL;  Surgeon: [name], MD;  Location: [facility] ENDOSCOPY;  Service: Gastroenterology;  Laterality: N/A;   GIVENS CAPSULE STUDY N/A 03/10/2021   Procedure: GIVENS CAPSULE STUDY;  Surgeon: Pasty Spillers, MD;  Location: [facility] ENDOSCOPY;  Service: Endoscopy;  Laterality: N/A;   HOT HEMOSTASIS  06/29/2023   Procedure: HOT HEMOSTASIS (ARGON PLASMA COAGULATION/BICAP);  Surgeon: [name], MD;  Location: [facility] ENDOSCOPY;  Service: Gastroenterology;;   HOT HEMOSTASIS  08/10/2023   Procedure: EGD, WITH ARGON PLASMA COAGULATION;  Surgeon: Toney Reil, MD;  Location: [facility] ENDOSCOPY;  Service: Gastroenterology;;   MEDIAL PARTIAL KNEE REPLACEMENT Left 1990   torn ligament. no knee replacement at that time   PARTIAL HYSTERECTOMY     SHOULDER ARTHROSCOPY WITH OPEN ROTATOR CUFF REPAIR Left  03/01/2016   Procedure: SHOULDER ARTHROSCOPY WITH OPEN ROTATOR CUFF REPAIR;  Surgeon: Juanell Fairly, MD;  Location: [facility] ORS;  Service: Orthopedics;  Laterality: Left;   TOTAL KNEE ARTHROPLASTY Left 02/14/2018   Procedure: TOTAL KNEE ARTHROPLASTY;  Surgeon: Juanell Fairly, MD;  Location: [facility] ORS;  Service: Orthopedics;  Laterality: Left;    FAMILY HISTORY: Family History  Problem Relation Age of Onset   CAD Mother    CAD Sister    Breast cancer Neg Hx     ADVANCED DIRECTIVES (Y/N):  N  HEALTH MAINTENANCE: Social History   Tobacco Use   Smoking status: Some Days    Current packs/day: 0.00    Average packs/day: 0.3 packs/day for 50.0 years (12.5 ttl pk-yrs)    Types: Cigarettes    Start date: 03/13/1966    Last attempt to quit: 03/13/2016    Years since quitting: 7.4   Smokeless tobacco: Never  Vaping Use   Vaping status: Never Used  Substance Use Topics   Alcohol use: No   Drug use: No     Colonoscopy:  PAP:  Bone density:  Lipid panel:  Allergies  Allergen Reactions   Latex Rash    Welts over body    Current Outpatient Medications  Medication Sig Dispense Refill   albuterol (VENTOLIN HFA) 108 (90 Base) MCG/ACT inhaler Inhale 2 puffs into the lungs every 6 (six) hours as needed for wheezing.     amitriptyline (ELAVIL) 25 MG tablet Take 25 mg by mouth at bedtime.     budesonide-formoterol (  SYMBICORT) 160-4.5 MCG/ACT inhaler Inhale 2 puffs into the lungs 2 (two) times daily.     furosemide (LASIX) 20 MG tablet Take 20 mg daily by mouth.      lisinopril (PRINIVIL,ZESTRIL) 20 MG tablet Take 20 mg by mouth daily.     potassium chloride (KLOR-CON M) 10 MEQ tablet Take 10 mEq by mouth daily.     rOPINIRole (REQUIP) 2 MG tablet Take 2-4 mg by mouth at bedtime as needed (restless leg syndrome).     simvastatin (ZOCOR) 20 MG tablet Take 20 mg by mouth at bedtime.     SPIRIVA HANDIHALER 18 MCG inhalation capsule Place 18 mcg into inhaler and inhale daily.      SUMAtriptan 6 MG/0.5ML SOAJ Inject into the skin.     vitamin B-12 (CYANOCOBALAMIN) 1000 MCG tablet Take 1,000 mcg by mouth daily.     omeprazole (PRILOSEC) 40 MG capsule Take 1 capsule (40 mg total) by mouth daily. (Patient not taking: Reported on 08/09/2023) 90 capsule 3   No current facility-administered medications for this visit.   Facility-Administered Medications Ordered in Other Visits  Medication Dose Route Frequency Provider Last Rate Last Admin   octreotide (SANDOSTATIN LAR) IM injection 30 mg  30 mg Intramuscular Once Jeralyn Ruths, MD        OBJECTIVE: Vitals:   08/20/23 0948  BP: (!) 134/92  Pulse: 70  Resp: 16  Temp: (!) 96.1 F (35.6 C)  SpO2: 100%      Body mass index is 27.37 kg/m.    ECOG FS:0 - Asymptomatic  General: Well-developed, well-nourished, no acute distress. Eyes: Pink conjunctiva, anicteric sclera. HEENT: Normocephalic, moist mucous membranes. Lungs: No audible wheezing or coughing. Heart: Regular rate and rhythm. Abdomen: Soft, nontender, no obvious distention. Musculoskeletal: No edema, cyanosis, or clubbing. Neuro: Alert, answering all questions appropriately. Cranial nerves grossly intact. Skin: No rashes or petechiae noted. Psych: Normal affect.  LAB RESULTS:  Lab Results  Component Value Date   NA 144 08/10/2023   K 3.7 08/10/2023   CL 109 08/10/2023   CO2 29 08/10/2023   GLUCOSE 104 (H) 08/10/2023   BUN 17 08/10/2023   CREATININE 0.68 08/10/2023   CALCIUM 8.2 (L) 08/10/2023   PROT 5.7 (L) 06/13/2023   ALBUMIN 3.3 (L) 06/13/2023   AST 17 06/13/2023   ALT 13 06/13/2023   ALKPHOS 28 (L) 06/13/2023   BILITOT 0.4 06/13/2023   GFRNONAA >60 08/10/2023   GFRAA >60 02/16/2018    Lab Results  Component Value Date   WBC 4.5 08/20/2023   NEUTROABS 3.2 08/20/2023   HGB 8.3 (L) 08/20/2023   HCT 28.4 (L) 08/20/2023   MCV 82.3 08/20/2023   PLT 429 (H) 08/20/2023   Lab Results  Component Value Date   IRON 21 (L) 04/17/2023    TIBC 427 04/17/2023   IRONPCTSAT 5 (L) 04/17/2023   Lab Results  Component Value Date   FERRITIN 24 04/17/2023     STUDIES: No results found.    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  Patient's hemoglobin is stable at 8.3, but I have recommended she return tomorrow for 1 unit packed red blood cells given her recent GI bleed.  GI has recommended increasing octreotide to once every 2 weeks.  Proceed with treatment today. 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  The dose of thalidomide is 100 mg daily for a total of 4 months.  Return to clinic in 2 weeks for repeat laboratory, further evaluation, and continuation of octreotide. History of AVMs: EGD and octreotide as above.  Continue follow-up with GI as scheduled.   Tendon repair: Patient recently tore a tendon in her right arm, but is unclear if and when she will need surgical repair. Coping/adjustment: Patient was previously given a referral to [facility].  I spent a total of 30 minutes reviewing chart data, face-to-face evaluation with the patient, counseling and coordination of care as detailed above.    Patient expressed understanding and was in agreement with this plan. She also understands that She can call clinic at any time with any questions, concerns, or complaints.    Jeralyn Ruths, MD   08/20/2023 10:11 AM

## 2023-08-21 ENCOUNTER — Inpatient Hospital Stay

## 2023-08-21 DIAGNOSIS — D509 Iron deficiency anemia, unspecified: Secondary | ICD-10-CM

## 2023-08-21 DIAGNOSIS — D649 Anemia, unspecified: Secondary | ICD-10-CM

## 2023-08-21 MED ORDER — ACETAMINOPHEN 325 MG PO TABS
650.0000 mg | ORAL_TABLET | Freq: Once | ORAL | Status: AC
  Administered 2023-08-21: 650 mg via ORAL
  Filled 2023-08-21: qty 2

## 2023-08-21 MED ORDER — SODIUM CHLORIDE 0.9% IV SOLUTION
250.0000 mL | INTRAVENOUS | Status: DC
  Administered 2023-08-21: 50 mL via INTRAVENOUS
  Filled 2023-08-21: qty 250

## 2023-08-21 MED ORDER — DIPHENHYDRAMINE HCL 50 MG/ML IJ SOLN
25.0000 mg | Freq: Once | INTRAMUSCULAR | Status: AC
  Administered 2023-08-21: 25 mg via INTRAVENOUS
  Filled 2023-08-21: qty 1

## 2023-08-21 NOTE — Patient Instructions (Signed)

## 2023-08-22 LAB — TYPE AND SCREEN
ABO/RH(D): O POS
Antibody Screen: NEGATIVE
Unit division: 0

## 2023-08-22 LAB — BPAM RBC
Blood Product Expiration Date: 202504082359
ISSUE DATE / TIME: 202503180841
Unit Type and Rh: 202504082359
Unit Type and Rh: 5100

## 2023-08-31 ENCOUNTER — Encounter: Payer: Self-pay | Admitting: Oncology

## 2023-09-04 ENCOUNTER — Other Ambulatory Visit: Payer: Self-pay | Admitting: *Deleted

## 2023-09-04 ENCOUNTER — Inpatient Hospital Stay

## 2023-09-04 ENCOUNTER — Inpatient Hospital Stay: Attending: Oncology

## 2023-09-04 ENCOUNTER — Encounter: Payer: Self-pay | Admitting: Oncology

## 2023-09-04 ENCOUNTER — Inpatient Hospital Stay (HOSPITAL_BASED_OUTPATIENT_CLINIC_OR_DEPARTMENT_OTHER): Admitting: Oncology

## 2023-09-04 ENCOUNTER — Telehealth: Payer: Self-pay

## 2023-09-04 VITALS — BP 110/69 | HR 91 | Temp 97.6°F | Resp 20 | Wt 147.5 lb

## 2023-09-04 DIAGNOSIS — F17210 Nicotine dependence, cigarettes, uncomplicated: Secondary | ICD-10-CM | POA: Insufficient documentation

## 2023-09-04 DIAGNOSIS — Z79899 Other long term (current) drug therapy: Secondary | ICD-10-CM | POA: Insufficient documentation

## 2023-09-04 DIAGNOSIS — D509 Iron deficiency anemia, unspecified: Secondary | ICD-10-CM

## 2023-09-04 DIAGNOSIS — K921 Melena: Secondary | ICD-10-CM

## 2023-09-04 DIAGNOSIS — D500 Iron deficiency anemia secondary to blood loss (chronic): Secondary | ICD-10-CM

## 2023-09-04 DIAGNOSIS — Q2730 Arteriovenous malformation, site unspecified: Secondary | ICD-10-CM

## 2023-09-04 LAB — CBC WITH DIFFERENTIAL/PLATELET
Abs Immature Granulocytes: 0.03 10*3/uL (ref 0.00–0.07)
Basophils Absolute: 0 10*3/uL (ref 0.0–0.1)
Basophils Relative: 1 %
Eosinophils Absolute: 0 10*3/uL (ref 0.0–0.5)
Eosinophils Relative: 0 %
HCT: 23.9 % — ABNORMAL LOW (ref 36.0–46.0)
Hemoglobin: 7.3 g/dL — ABNORMAL LOW (ref 12.0–15.0)
Immature Granulocytes: 1 %
Lymphocytes Relative: 20 %
Lymphs Abs: 1.1 10*3/uL (ref 0.7–4.0)
MCH: 25.3 pg — ABNORMAL LOW (ref 26.0–34.0)
MCHC: 30.5 g/dL (ref 30.0–36.0)
MCV: 82.7 fL (ref 80.0–100.0)
Monocytes Absolute: 0.4 10*3/uL (ref 0.1–1.0)
Monocytes Relative: 8 %
Neutro Abs: 3.9 10*3/uL (ref 1.7–7.7)
Neutrophils Relative %: 70 %
Platelets: 286 10*3/uL (ref 150–400)
RBC: 2.89 MIL/uL — ABNORMAL LOW (ref 3.87–5.11)
RDW: 19.4 % — ABNORMAL HIGH (ref 11.5–15.5)
WBC: 5.5 10*3/uL (ref 4.0–10.5)
nRBC: 0 % (ref 0.0–0.2)

## 2023-09-04 LAB — SAMPLE TO BLOOD BANK

## 2023-09-04 LAB — PREPARE RBC (CROSSMATCH)

## 2023-09-04 MED ORDER — OCTREOTIDE ACETATE 30 MG IM KIT
30.0000 mg | PACK | Freq: Once | INTRAMUSCULAR | Status: AC
  Administered 2023-09-04: 30 mg via INTRAMUSCULAR
  Filled 2023-09-04: qty 1

## 2023-09-04 NOTE — Telephone Encounter (Signed)
 Patient verbalized understanding she states she does not have a ride to make it to [facility] ER today but she will in the morning. She states she will go first thing in the morning. She understood we will place referral to [facility]. Placed referral and faxed to [facility] and got confirmation fax went through.

## 2023-09-04 NOTE — Progress Notes (Signed)
 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  INTERVAL HISTORY: Patient returns to clinic today for repeat laboratory work, further evaluation, continuation of octreotide, and consideration of additional blood.  She noted some increased weakness and fatigue last week, but otherwise feels well.  She has no neurologic complaints. She denies any recent fevers or illnesses.  She has a fair appetite.  She denies any chest pain, shortness of breath, cough, or hemoptysis.  She denies any nausea, vomiting, constipation, or diarrhea.  She has noticed no melena or hematochezia.  She has no urinary complaints.  Patient offers no further specific complaints today.  REVIEW OF SYSTEMS:   Review of Systems  Constitutional: Negative.  Negative for fever, malaise/fatigue and weight loss.  Respiratory: Negative.  Negative for cough and shortness of breath.   Cardiovascular: Negative.  Negative for chest pain and leg swelling.  Gastrointestinal: Negative.  Negative for abdominal pain, blood in stool and melena.  Genitourinary: Negative.  Negative for hematuria.  Musculoskeletal: Negative.  Negative for back pain.  Skin: Negative.  Negative for rash.  Neurological: Negative.  Negative for dizziness, focal weakness, weakness and headaches.  Psychiatric/Behavioral: Negative.  The patient is not nervous/anxious.     As per HPI. Otherwise, a complete review of systems is negative.  PAST MEDICAL HISTORY: Past Medical History:  Diagnosis Date   Anemia    Arthritis    Breast cancer (HCC) 1990   Right   COPD (chronic obstructive pulmonary disease) (HCC)    Dyspnea    DOE   Elevated lipids     Hypertension    Lower extremity edema    Migraines    Migraines    Personal history of radiation therapy 1990   Breast   Restless leg syndrome    Rotator cuff injury     PAST SURGICAL HISTORY: Past Surgical History:  Procedure Laterality Date   ABDOMINAL HYSTERECTOMY     BACK SURGERY  08/2017   CERVICAL FUSION   BREAST BIOPSY Right 1990   LUMPECTOMY.  took lymph nodes   BREAST LUMPECTOMY     COLONOSCOPY WITH PROPOFOL N/A 07/20/2017   Procedure: COLONOSCOPY WITH PROPOFOL;  Surgeon: Pasty Spillers, MD;  Location: [facility] ENDOSCOPY;  Service: Endoscopy;  Laterality: N/A;   COLONOSCOPY WITH PROPOFOL N/A 01/27/2022   Procedure: COLONOSCOPY WITH PROPOFOL;  Surgeon: [name], MD;  Location: [facility] ENDOSCOPY;  Service: Gastroenterology;  Laterality: N/A;   ENTEROSCOPY N/A 08/08/2017   Procedure: ENTEROSCOPY;  Surgeon: Toney Reil, MD;  Location: [facility] ENDOSCOPY;  Service: Gastroenterology;  Laterality: N/A;   ENTEROSCOPY N/A 05/07/2020   Procedure: ENTEROSCOPY with Adult colonoscope;  Surgeon: Pasty Spillers, MD;  Location: [facility] ENDOSCOPY;  Service: Endoscopy;  Laterality: N/A;   ENTEROSCOPY N/A 03/17/2021   Procedure: ENTEROSCOPY;  Surgeon: Pasty Spillers, MD;  Location: [facility] ENDOSCOPY;  Service: Endoscopy;  Laterality: N/A;  PUSH   ENTEROSCOPY N/A 07/13/2021   Procedure: ENTEROSCOPY;  Surgeon: Toney Reil, MD;  Location: [facility] ENDOSCOPY;  Service: Gastroenterology;  Laterality: N/A;   ENTEROSCOPY N/A 10/26/2021   Procedure: ENTEROSCOPY;  Surgeon: Toney Reil, MD;  Location: [facility] ENDOSCOPY;  Service: Gastroenterology;  Laterality: N/A;  push   ENTEROSCOPY N/A 07/27/2022   Procedure:  ENTEROSCOPY;  Surgeon: Midge Minium, MD;  Location: [facility] ENDOSCOPY;  Service: Endoscopy;  Laterality: N/A;  Push   ENTEROSCOPY N/A 06/29/2023   Procedure: ENTEROSCOPY;  Surgeon: [name], MD;  Location: [facility] ENDOSCOPY;  Service: Gastroenterology;  Laterality: N/A;  Push    ENTEROSCOPY N/A 08/10/2023   Procedure: ENTEROSCOPY;  Surgeon: Toney Reil, MD;  Location: [facility] ENDOSCOPY;  Service: Gastroenterology;  Laterality: N/A;  Push   ESOPHAGOGASTRODUODENOSCOPY Left 04/13/2017   Procedure: ESOPHAGOGASTRODUODENOSCOPY (EGD);  Surgeon: Pasty Spillers, MD;  Location: [facility] ENDOSCOPY;  Service: Endoscopy;  Laterality: Left;   ESOPHAGOGASTRODUODENOSCOPY (EGD) WITH PROPOFOL N/A 07/20/2017   Procedure: ESOPHAGOGASTRODUODENOSCOPY (EGD) WITH PROPOFOL;  Surgeon: Pasty Spillers, MD;  Location: [facility] ENDOSCOPY;  Service: Endoscopy;  Laterality: N/A;   ESOPHAGOGASTRODUODENOSCOPY (EGD) WITH PROPOFOL N/A 12/12/2017   Procedure: ESOPHAGOGASTRODUODENOSCOPY (EGD) WITH PROPOFOL;  Surgeon: Pasty Spillers, MD;  Location: [facility] ENDOSCOPY;  Service: Endoscopy;  Laterality: N/A;   ESOPHAGOGASTRODUODENOSCOPY (EGD) WITH PROPOFOL N/A 12/23/2020   Procedure: ESOPHAGOGASTRODUODENOSCOPY (EGD) WITH PROPOFOL;  Surgeon: Pasty Spillers, MD;  Location: [facility] ENDOSCOPY;  Service: Endoscopy;  Laterality: N/A;   ESOPHAGOGASTRODUODENOSCOPY (EGD) WITH PROPOFOL N/A 01/27/2022   Procedure: ESOPHAGOGASTRODUODENOSCOPY (EGD) WITH PROPOFOL;  Surgeon: [name], MD;  Location: [facility] ENDOSCOPY;  Service: Gastroenterology;  Laterality: N/A;   GIVENS CAPSULE STUDY N/A 03/10/2021   Procedure: GIVENS CAPSULE STUDY;  Surgeon: Pasty Spillers, MD;  Location: [facility] ENDOSCOPY;  Service: Endoscopy;  Laterality: N/A;   HOT HEMOSTASIS  06/29/2023   Procedure: HOT HEMOSTASIS (ARGON PLASMA COAGULATION/BICAP);  Surgeon: [name], MD;  Location: [facility] ENDOSCOPY;  Service: Gastroenterology;;   HOT HEMOSTASIS  08/10/2023   Procedure: EGD, WITH ARGON PLASMA COAGULATION;  Surgeon: Toney Reil, MD;  Location: [facility] ENDOSCOPY;  Service: Gastroenterology;;   MEDIAL PARTIAL KNEE REPLACEMENT Left 1990   torn ligament. no knee replacement at that time   PARTIAL HYSTERECTOMY     SHOULDER ARTHROSCOPY WITH  OPEN ROTATOR CUFF REPAIR Left 03/01/2016   Procedure: SHOULDER ARTHROSCOPY WITH OPEN ROTATOR CUFF REPAIR;  Surgeon: Juanell Fairly, MD;  Location: [facility] ORS;  Service: Orthopedics;  Laterality: Left;   TOTAL KNEE ARTHROPLASTY Left 02/14/2018   Procedure: TOTAL KNEE ARTHROPLASTY;  Surgeon: Juanell Fairly, MD;  Location: [facility] ORS;  Service: Orthopedics;  Laterality: Left;    FAMILY HISTORY: Family History  Problem Relation Age of Onset   CAD Mother    CAD Sister    Breast cancer Neg Hx     ADVANCED DIRECTIVES (Y/N):  N  HEALTH MAINTENANCE: Social History   Tobacco Use   Smoking status: Some Days    Current packs/day: 0.00    Average packs/day: 0.3 packs/day for 50.0 years (12.5 ttl pk-yrs)    Types: Cigarettes    Start date: 03/13/1966    Last attempt to quit: 03/13/2016    Years since quitting: 7.4   Smokeless tobacco: Never  Vaping Use   Vaping status: Never Used  Substance Use Topics   Alcohol use: No   Drug use: No     Colonoscopy:  PAP:  Bone density:  Lipid panel:  Allergies  Allergen Reactions   Latex Rash    Welts over body    Current Outpatient Medications  Medication Sig Dispense Refill   albuterol (VENTOLIN HFA) 108 (90 Base) MCG/ACT inhaler Inhale 2 puffs into the lungs every 6 (six) hours as needed for wheezing.     amitriptyline (ELAVIL) 25 MG tablet Take 25 mg by mouth at bedtime.  budesonide-formoterol (SYMBICORT) 160-4.5 MCG/ACT inhaler Inhale 2 puffs into the lungs 2 (two) times daily.     furosemide (LASIX) 20 MG tablet Take 20 mg daily by mouth.      lisinopril (PRINIVIL,ZESTRIL) 20 MG tablet Take 20 mg by mouth daily.     potassium chloride (KLOR-CON M) 10 MEQ tablet Take 10 mEq by mouth daily.     rOPINIRole (REQUIP) 2 MG tablet Take 2-4 mg by mouth at bedtime as needed (restless leg syndrome).     simvastatin (ZOCOR) 20 MG tablet Take 20 mg by mouth at bedtime.     SPIRIVA HANDIHALER 18 MCG inhalation capsule Place 18 mcg into inhaler  and inhale daily.     SUMAtriptan 6 MG/0.5ML SOAJ Inject into the skin.     vitamin B-12 (CYANOCOBALAMIN) 1000 MCG tablet Take 1,000 mcg by mouth daily.     omeprazole (PRILOSEC) 40 MG capsule Take 1 capsule (40 mg total) by mouth daily. (Patient not taking: Reported on 09/04/2023) 90 capsule 3   No current facility-administered medications for this visit.   Facility-Administered Medications Ordered in Other Visits  Medication Dose Route Frequency Provider Last Rate Last Admin   octreotide (SANDOSTATIN LAR) IM injection 30 mg  30 mg Intramuscular Once Jeralyn Ruths, MD        OBJECTIVE: Vitals:   09/04/23 0906  BP: 110/69  Pulse: 91  Resp: 20  Temp: 97.6 F (36.4 C)  SpO2: 100%      Body mass index is 26.13 kg/m.    ECOG FS:0 - Asymptomatic  General: Well-developed, well-nourished, no acute distress. Eyes: Pink conjunctiva, anicteric sclera. HEENT: Normocephalic, moist mucous membranes. Lungs: No audible wheezing or coughing. Heart: Regular rate and rhythm. Abdomen: Soft, nontender, no obvious distention. Musculoskeletal: No edema, cyanosis, or clubbing. Neuro: Alert, answering all questions appropriately. Cranial nerves grossly intact. Skin: No rashes or petechiae noted. Psych: Normal affect.  LAB RESULTS:  Lab Results  Component Value Date   NA 144 08/10/2023   K 3.7 08/10/2023   CL 109 08/10/2023   CO2 29 08/10/2023   GLUCOSE 104 (H) 08/10/2023   BUN 17 08/10/2023   CREATININE 0.68 08/10/2023   CALCIUM 8.2 (L) 08/10/2023   PROT 5.7 (L) 06/13/2023   ALBUMIN 3.3 (L) 06/13/2023   AST 17 06/13/2023   ALT 13 06/13/2023   ALKPHOS 28 (L) 06/13/2023   BILITOT 0.4 06/13/2023   GFRNONAA >60 08/10/2023   GFRAA >60 02/16/2018    Lab Results  Component Value Date   WBC 5.5 09/04/2023   NEUTROABS 3.9 09/04/2023   HGB 7.3 (L) 09/04/2023   HCT 23.9 (L) 09/04/2023   MCV 82.7 09/04/2023   PLT 286 09/04/2023   Lab Results  Component Value Date   IRON 21 (L)  04/17/2023   TIBC 427 04/17/2023   IRONPCTSAT 5 (L) 04/17/2023   Lab Results  Component Value Date   FERRITIN 24 04/17/2023     STUDIES: No results found.    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  Despite receiving 1 unit packed red blood cells 2 weeks ago, patient's hemoglobin has trended down to 7.3.  Proceed with octreotide every 2 weeks as scheduled.  Patient will also return to clinic tomorrow for 1 unit packed red blood cells. 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  The dose of thalidomide is 100 mg daily for a total of 4 months.  Unlikely to use thalidomide in this patient given its toxicity and unclear efficacy.  Return to clinic in 2 weeks for repeat laboratory, further evaluation, and continuation of octreotide. History of AVMs: EGD and octreotide as above.  Continue follow-up with GI as scheduled.   Tendon repair: Patient recently tore a tendon in her right arm, but is unclear if and when she will need surgical repair. Coping/adjustment: Patient was previously given a referral to [facility].  I spent a total of 30 minutes reviewing chart data, face-to-face evaluation with the patient, counseling and coordination of care as detailed above.   Patient expressed understanding and was in agreement with this plan. She also understands that She can call clinic at any time with any questions, concerns, or complaints.    Jeralyn Ruths, MD   09/04/2023 9:32 AM

## 2023-09-04 NOTE — Telephone Encounter (Signed)
 Per Dr. Wanda Plump morning Dr. Allegra Lai. Patient's hemoglobin decreased from 8.3-7.3 despite 1 unit of packed red blood cells. Suspect she is still bleeding. She will get octreotide today and 1 unit of blood tomorrow. Previously, you suggested a trial of thalidomide. I am uncomfortable using this drug in this scenario plus, I do not think she can tolerate the toxicity associated with it.   Per Dr. Allegra Lai:  Morrie Sheldon, please check with Ms Childers later if she is having black stools again   Per Dr. Orlie Dakin: She told me today that she noticed increased weakness and fatigue last week, denied any changes in her stool   Called patient and left a message for call back. Called patient husband and asked for the patient  and she states she is having black stools with every bowel movement. She states she is very weak and fatigue. She states she has no energy to do anything.

## 2023-09-04 NOTE — Addendum Note (Signed)
 Addended by: Radene Knee L on: 09/04/2023 02:48 PM   Modules accepted: Orders

## 2023-09-04 NOTE — Telephone Encounter (Signed)
 Because of ongoing black stools and recent push enteroscopy during which I have reached farthest extent I could and treated AVMs.  I highly recommend her go to [facility] for further management. 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

## 2023-09-05 ENCOUNTER — Inpatient Hospital Stay

## 2023-09-08 LAB — BPAM RBC
Blood Product Expiration Date: 202504272359
Unit Type and Rh: 5100

## 2023-09-08 LAB — TYPE AND SCREEN
ABO/RH(D): O POS
Antibody Screen: NEGATIVE
Unit division: 0

## 2023-09-17 ENCOUNTER — Telehealth: Payer: Self-pay | Admitting: Gastroenterology

## 2023-09-17 NOTE — Telephone Encounter (Signed)
 Pt called to schedule appointment before she can be released to from [facility]  we offered her 09/24/2023 She stated that she would need to make sure she can get transportation and will call us  back.

## 2023-09-18 ENCOUNTER — Inpatient Hospital Stay

## 2023-09-18 ENCOUNTER — Inpatient Hospital Stay: Admitting: Oncology

## 2023-09-18 ENCOUNTER — Telehealth: Payer: Self-pay | Admitting: Oncology

## 2023-09-18 NOTE — Telephone Encounter (Signed)
 Called patient to check on her since she did not come to appointment- no answer left voicemail also stated on voicemail that tomorrows appointment will be cancelled since she didn't come for labs today.

## 2023-09-19 ENCOUNTER — Inpatient Hospital Stay

## 2023-09-24 ENCOUNTER — Encounter: Payer: Self-pay | Admitting: Oncology

## 2023-09-24 ENCOUNTER — Encounter: Payer: Self-pay | Admitting: Gastroenterology

## 2023-09-24 ENCOUNTER — Ambulatory Visit (INDEPENDENT_AMBULATORY_CARE_PROVIDER_SITE_OTHER): Admitting: Gastroenterology

## 2023-09-24 VITALS — BP 128/75 | HR 105 | Temp 97.5°F | Ht 63.0 in | Wt 150.5 lb

## 2023-09-24 DIAGNOSIS — Q2730 Arteriovenous malformation, site unspecified: Secondary | ICD-10-CM

## 2023-09-24 DIAGNOSIS — D500 Iron deficiency anemia secondary to blood loss (chronic): Secondary | ICD-10-CM | POA: Diagnosis not present

## 2023-09-24 DIAGNOSIS — K921 Melena: Secondary | ICD-10-CM | POA: Diagnosis not present

## 2023-09-24 DIAGNOSIS — K31819 Angiodysplasia of stomach and duodenum without bleeding: Secondary | ICD-10-CM | POA: Diagnosis not present

## 2023-09-24 NOTE — Progress Notes (Signed)
 Karma Oz, MD [address]  [address]  [place], [place] [address]  Main: [phone]  Fax: [phone]    Gastroenterology Consultation  Referring Provider:     Dionicia Frater, MD Primary Care Physician:  Dionicia Frater, MD Primary Gastroenterologist:  Dr. Karma Oz Reason for Consultation: Chronic iron  deficiency anemia, melena        HPI:   Hailey Farrell is a 73 y.o. female referred by Dr. Sharlon Deacon, Lurlean Salmons, MD  for consultation & management of chronic iron  deficiency anemia.  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 pylori.  Patient has been receiving parenteral iron  therapy.  Patient was admitted to [facility] on 07/12/2021 secondary to acute on chronic iron  deficiency anemia, hemoglobin dropped to 5.8 from 8.2, received blood transfusion as well as iron  infusions.  She reports followed by hematology since discharge, has been receiving parenteral iron  therapy.  She recently had another acute on chronic blood loss anemia on 5/8, preceding this she reported 2 to 3 days of black stools, her repeat hemoglobin dropped from 9.8 to 6.5, she received 1 unit of PRBCs at the cancer center along with IV iron .  She is currently scheduled to receive weekly iron  infusions.  Patient currently reports having brown bowel movements.  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  Patient was recently admitted to [facility] on 01/25/2022 secondary to 2 weeks history of melena.  Her hemoglobin on admission was 5.1, received 4 units of PRBCs and underwent colonoscopy with small bowel enteroscopy with no source of bleeding  identified.  Her hemoglobin at the time of discharge was 8.7 on 01/28/2022.  Patient tells me that she waited so long hoping that her bleeding would stop but she became very weak.  She is currently having brown bowel movements daily.  She is accompanied by her husband.  Video capsule endoscopy from 01/27/2022 revealed a nonbleeding small AVM in small bowel.  Follow-up visit 07/26/2022 Patient reports 2 days history of black tarry stools.  She had 1 bowel movement day before yesterday, 2 yesterday and 1 this morning.  She feels slightly tired.  She is tearful about bleeding again and is frustrated with recurrent bleeding and multiple procedures.  She is receiving octreotide  injections as well as iron  infusions.  Patient is accompanied by her husband today.  She denies any other GI symptoms.  Denies any alcohol use, NSAID use.  She is not taking any oral iron  supplements  Follow-up visit 05/17/2023 Hailey Farrell is here for follow-up of worsening iron  deficiency anemia.  She reports having 1 month history of black tarry stools, received 1 unit of PRBCs at cancer center today because of severe symptomatic anemia and hemoglobin of 7.2.  She received blood transfusion in September as well as in November due to critically low hemoglobin of 5.8 and 6.5 respectively.  Patient is tearful and frustrated with ongoing anemia requiring multiple blood transfusions.  She is no longer on octreotide  injections, felt not working.  She is not on any oral iron .  She reports feeling tired, fatigue, loss of appetite.  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  Since I last saw her in early December, for acute on chronic blood loss anemia with ongoing melena I went ahead and did further workup including Meckel scan which was negative.  I have advised her to get admitted at tertiary care center for further  workup and she was admitted to [facility] on 05/21/2023, underwent upper endoscopy with video capsule placement on 12/17, capsule endoscopy revealed bleeding AVM in the small bowel as well as nonbleeding AVM in the small bowel.  Unfortunately, patient did not undergo treatment of AVMs during that admission with the plan to proceed push enteroscopy locally and resume octreotide  injections which she stopped prior to the onset of recurrent bleeding in December 2024.  She did have recurrent bleeding in mid January, received blood transfusion as well as IV iron .  She was scheduled an office visit at [facility] on 06/28/2023 and I had a discussion with the fellow who saw the patient in the office.  They recommend repeat video capsule study to locate AVM if it's beyond reach for push enteroscopy in order to proceed with single balloon endoscopy. I was able to arrange enteroscopy on 06/29/2023 with Dr. Antony Baumgartner and she had several nonbleeding AVMs in third portion of duodenum which were cauterized with APC and there was no evidence of AVMs in jejunum.  Unfortunately, patient today reports that she started noticing black tarry stools 1 week after the enteroscopy and she continues to feel very tired.  She is disappointed that her bleeding recurred.  In the interim, she also saw Dr. Adrian Alba, received octreotide  injection as well as IV iron  and her hemoglobin is being maintained around 10.  She is accompanied by her husband today and she is tearful about ongoing bleeding  Follow-up visit 09/24/2023 Hailey Farrell is here for follow-up of chronic blood loss anemia from [place] bowel AVMs.  She was hospitalized at [facility] on 09/13/2023 secondary to ongoing melena and severe symptomatic anemia, hemoglobin dropped to 5.8 from baseline of 10, normal BUN/creatinine.  She received 4 units of PRBCs.  She underwent small bowel enteroscopy and subsequently capsule endoscopy with no evidence of bleeding identified other than red spots in the jejunum.   No intervention was performed.  Her hemoglobin was 7.1 on 4/14 at the time of discharge.  Today, patient is in good spirits, she gained about 2 pounds, reports good energy levels and appetite, she is able to walk without feeling short of breath, reports her stools are brown.  Accompanied by her husband today She missed her octreotide  shot last week when she was hospitalized at [facility]   NSAIDs: None  Antiplts/Anticoagulants/Anti thrombotics: None  GI Procedures: Reviewed under procedures tab  Past Medical History:  Diagnosis Date   Anemia    Arthritis    Breast cancer (HCC) 1990   Right   COPD (chronic obstructive pulmonary disease) (HCC)    Dyspnea    DOE   Elevated lipids    Hypertension    Lower extremity edema    Migraines    Migraines    Personal history of radiation therapy 1990   Breast   Restless leg syndrome    Rotator cuff injury     Past Surgical History:  Procedure Laterality Date   ABDOMINAL HYSTERECTOMY     BACK SURGERY  08/2017   CERVICAL FUSION   BREAST BIOPSY Right 1990  LUMPECTOMY.  took lymph nodes   BREAST LUMPECTOMY     COLONOSCOPY WITH PROPOFOL  N/A 07/20/2017   Procedure: COLONOSCOPY WITH PROPOFOL ;  Surgeon: Irby Mannan, MD;  Location: [facility] ENDOSCOPY;  Service: Endoscopy;  Laterality: N/A;   COLONOSCOPY WITH PROPOFOL  N/A 01/27/2022   Procedure: COLONOSCOPY WITH PROPOFOL ;  Surgeon: Luke Salaam, MD;  Location: [facility] ENDOSCOPY;  Service: Gastroenterology;  Laterality: N/A;   ENTEROSCOPY N/A 08/08/2017   Procedure: ENTEROSCOPY;  Surgeon: Selena Daily, MD;  Location: [facility] ENDOSCOPY;  Service: Gastroenterology;  Laterality: N/A;   ENTEROSCOPY N/A 05/07/2020   Procedure: ENTEROSCOPY with Adult colonoscope;  Surgeon: Irby Mannan, MD;  Location: [facility] ENDOSCOPY;  Service: Endoscopy;  Laterality: N/A;   ENTEROSCOPY N/A 03/17/2021   Procedure: ENTEROSCOPY;  Surgeon: Irby Mannan, MD;  Location: [facility] ENDOSCOPY;  Service: Endoscopy;   Laterality: N/A;  PUSH   ENTEROSCOPY N/A 07/13/2021   Procedure: ENTEROSCOPY;  Surgeon: Selena Daily, MD;  Location: [facility] ENDOSCOPY;  Service: Gastroenterology;  Laterality: N/A;   ENTEROSCOPY N/A 10/26/2021   Procedure: ENTEROSCOPY;  Surgeon: Selena Daily, MD;  Location: [facility] ENDOSCOPY;  Service: Gastroenterology;  Laterality: N/A;  push   ENTEROSCOPY N/A 07/27/2022   Procedure: ENTEROSCOPY;  Surgeon: Marnee Sink, MD;  Location: [facility] ENDOSCOPY;  Service: Endoscopy;  Laterality: N/A;  Push   ENTEROSCOPY N/A 06/29/2023   Procedure: ENTEROSCOPY;  Surgeon: Luke Salaam, MD;  Location: [facility] ENDOSCOPY;  Service: Gastroenterology;  Laterality: N/A;  Push   ENTEROSCOPY N/A 08/10/2023   Procedure: ENTEROSCOPY;  Surgeon: Selena Daily, MD;  Location: [facility] ENDOSCOPY;  Service: Gastroenterology;  Laterality: N/A;  Push   ESOPHAGOGASTRODUODENOSCOPY Left 04/13/2017   Procedure: ESOPHAGOGASTRODUODENOSCOPY (EGD);  Surgeon: Irby Mannan, MD;  Location: [facility] ENDOSCOPY;  Service: Endoscopy;  Laterality: Left;   ESOPHAGOGASTRODUODENOSCOPY (EGD) WITH PROPOFOL  N/A 07/20/2017   Procedure: ESOPHAGOGASTRODUODENOSCOPY (EGD) WITH PROPOFOL ;  Surgeon: Irby Mannan, MD;  Location: [facility] ENDOSCOPY;  Service: Endoscopy;  Laterality: N/A;   ESOPHAGOGASTRODUODENOSCOPY (EGD) WITH PROPOFOL  N/A 12/12/2017   Procedure: ESOPHAGOGASTRODUODENOSCOPY (EGD) WITH PROPOFOL ;  Surgeon: Irby Mannan, MD;  Location: [facility] ENDOSCOPY;  Service: Endoscopy;  Laterality: N/A;   ESOPHAGOGASTRODUODENOSCOPY (EGD) WITH PROPOFOL  N/A 12/23/2020   Procedure: ESOPHAGOGASTRODUODENOSCOPY (EGD) WITH PROPOFOL ;  Surgeon: Irby Mannan, MD;  Location: [facility] ENDOSCOPY;  Service: Endoscopy;  Laterality: N/A;   ESOPHAGOGASTRODUODENOSCOPY (EGD) WITH PROPOFOL  N/A 01/27/2022   Procedure: ESOPHAGOGASTRODUODENOSCOPY (EGD) WITH PROPOFOL ;  Surgeon: Luke Salaam, MD;  Location: [facility] ENDOSCOPY;  Service: Gastroenterology;  Laterality:  N/A;   GIVENS CAPSULE STUDY N/A 03/10/2021   Procedure: GIVENS CAPSULE STUDY;  Surgeon: Irby Mannan, MD;  Location: [facility] ENDOSCOPY;  Service: Endoscopy;  Laterality: N/A;   HOT HEMOSTASIS  06/29/2023   Procedure: HOT HEMOSTASIS (ARGON PLASMA COAGULATION/BICAP);  Surgeon: Luke Salaam, MD;  Location: [facility] ENDOSCOPY;  Service: Gastroenterology;;   HOT HEMOSTASIS  08/10/2023   Procedure: EGD, WITH ARGON PLASMA COAGULATION;  Surgeon: Selena Daily, MD;  Location: [facility] ENDOSCOPY;  Service: Gastroenterology;;   MEDIAL PARTIAL KNEE REPLACEMENT Left 1990   torn ligament. no knee replacement at that time   PARTIAL HYSTERECTOMY     SHOULDER ARTHROSCOPY WITH OPEN ROTATOR CUFF REPAIR Left 03/01/2016   Procedure: SHOULDER ARTHROSCOPY WITH OPEN ROTATOR CUFF REPAIR;  Surgeon: Rande Bushy, MD;  Location: [facility] ORS;  Service: Orthopedics;  Laterality: Left;   TOTAL KNEE ARTHROPLASTY Left 02/14/2018   Procedure: TOTAL KNEE ARTHROPLASTY;  Surgeon: Rande Bushy, MD;  Location: [facility] ORS;  Service: Orthopedics;  Laterality: Left;  Current Outpatient Medications:    albuterol  (VENTOLIN  HFA) 108 (90 Base) MCG/ACT inhaler, Inhale 2 puffs into the lungs every 6 (six) hours as needed for wheezing., Disp: , Rfl:    amitriptyline  (ELAVIL ) 25 MG tablet, Take 25 mg by mouth at bedtime., Disp: , Rfl:    budesonide-formoterol  (SYMBICORT) 160-4.5 MCG/ACT inhaler, Inhale 2 puffs into the lungs 2 (two) times daily., Disp: , Rfl:    furosemide  (LASIX ) 20 MG tablet, Take 20 mg daily by mouth. , Disp: , Rfl:    lisinopril  (PRINIVIL ,ZESTRIL ) 20 MG tablet, Take 20 mg by mouth daily., Disp: , Rfl:    potassium chloride  (KLOR-CON  M) 10 MEQ tablet, Take 10 mEq by mouth daily., Disp: , Rfl:    rOPINIRole  (REQUIP ) 2 MG tablet, Take 2-4 mg by mouth at bedtime as needed (restless leg syndrome)., Disp: , Rfl:    simvastatin  (ZOCOR ) 20 MG tablet, Take 20 mg by mouth at bedtime., Disp: , Rfl:    SPIRIVA  HANDIHALER 18 MCG  inhalation capsule, Place 18 mcg into inhaler and inhale daily., Disp: , Rfl:    SUMAtriptan  6 MG/0.5ML SOAJ, Inject into the skin., Disp: , Rfl:    vitamin B-12 (CYANOCOBALAMIN ) 1000 MCG tablet, Take 1,000 mcg by mouth daily., Disp: , Rfl:    omeprazole  (PRILOSEC ) 40 MG capsule, Take 1 capsule (40 mg total) by mouth daily. (Patient not taking: Reported on 08/09/2023), Disp: 90 capsule, Rfl: 3    Family History  Problem Relation Age of Onset   CAD Mother    CAD Sister    Breast cancer Neg Hx      Social History   Tobacco Use   Smoking status: Some Days    Current packs/day: 0.00    Average packs/day: 0.3 packs/day for 50.0 years (12.5 ttl pk-yrs)    Types: Cigarettes    Start date: 03/13/1966    Last attempt to quit: 03/13/2016    Years since quitting: 7.5   Smokeless tobacco: Never  Vaping Use   Vaping status: Never Used  Substance Use Topics   Alcohol use: No   Drug use: No    Allergies as of 09/24/2023 - Review Complete 09/24/2023  Allergen Reaction Noted   Latex Rash 02/03/2015    Review of Systems:    All systems reviewed and negative except where noted in HPI.   Physical Exam:  BP 128/75 (BP Location: Left Arm, Patient Position: Sitting, Cuff Size: Normal)   Pulse (!) 105   Temp (!) 97.5 F (36.4 C) (Oral)   Ht 5\' 3"  (1.6 m)   Wt 150 lb 8 oz (68.3 kg)   BMI 26.66 kg/m  No LMP recorded. Patient has had a hysterectomy.  General:   Alert,  Well-developed, well-nourished, pleasant and cooperative in NAD Head:  Normocephalic and atraumatic. Eyes:  Sclera clear, no icterus.   Conjunctiva pink. Ears:  Normal auditory acuity. Nose:  No deformity, discharge, or lesions. Mouth:  No deformity or lesions,oropharynx pink & moist. Neck:  Supple; no masses or thyromegaly. Lungs:  Respirations even and unlabored.  Clear throughout to auscultation.   No wheezes, crackles, or rhonchi. No acute distress. Heart:  Regular rate and rhythm; no murmurs, clicks, rubs, or  gallops. Abdomen:  Normal bowel sounds. Soft, non-tender and non-distended without masses, hepatosplenomegaly or hernias noted.  No guarding or rebound tenderness.   Rectal: Not performed Msk:  Symmetrical without gross deformities. Good, equal movement & strength bilaterally. Pulses:  Normal pulses noted. Extremities:  No clubbing  or edema.  No cyanosis. Neurologic:  Alert and oriented x3;  grossly normal neurologically. Skin:  Intact without significant lesions or rashes. No jaundice. Psych:  Alert and cooperative. Normal mood and affect.  Imaging Studies: No abdominal imaging  Assessment and Plan:   Hailey Farrell is a 73 y.o. 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  Patient underwent repeat colonoscopy as well as small bowel enteroscopy, small bowel AVMs were not identified.  She also underwent video capsule endoscopy on 01/27/2022 which revealed a small nonbleeding AVM in the small intestine.  She has also received about 7 monthly octreotide  injections, dependent on parenteral iron  therapy and blood transfusions as needed Recurrence of melena requiring blood transfusion in September, November, December 2024 as well as in January 2025.  Unfortunately, patient did not undergo any treatment of AVMs at [facility] during her admission in December 2024, underwent push enteroscopy on 06/29/2023 at [facility], nonbleeding AVMs in third portion of duodenum were treated with APC Meckel's scan was negative Recurrent acute on chronic small bowel bleed on 09/13/23, admitted to main [facility], received 4 units of PRBCs, underwent capsule endoscopy and small bowel enteroscopy with no active bleeding source identified Restarted octreotide  injection every 2 weeks, receiving IV iron  as needed, closely  followed by Dr. Antonia Battiest labs today    Follow up closely in 2 to 3 months  Karma Oz, MD

## 2023-09-25 ENCOUNTER — Telehealth: Payer: Self-pay

## 2023-09-25 ENCOUNTER — Telehealth: Payer: Self-pay | Admitting: *Deleted

## 2023-09-25 LAB — CBC WITH DIFFERENTIAL/PLATELET
Basophils Absolute: 0 10*3/uL (ref 0.0–0.2)
Basos: 1 %
EOS (ABSOLUTE): 0.1 10*3/uL (ref 0.0–0.4)
Eos: 1 %
Hematocrit: 32.6 % — ABNORMAL LOW (ref 34.0–46.6)
Hemoglobin: 10 g/dL — ABNORMAL LOW (ref 11.1–15.9)
Immature Grans (Abs): 0 10*3/uL (ref 0.0–0.1)
Immature Granulocytes: 0 %
Lymphocytes Absolute: 1.2 10*3/uL (ref 0.7–3.1)
Lymphs: 22 %
MCH: 25.2 pg — ABNORMAL LOW (ref 26.6–33.0)
MCHC: 30.7 g/dL — ABNORMAL LOW (ref 31.5–35.7)
MCV: 82 fL (ref 79–97)
Monocytes Absolute: 0.5 10*3/uL (ref 0.1–0.9)
Monocytes: 10 %
Neutrophils Absolute: 3.5 10*3/uL (ref 1.4–7.0)
Neutrophils: 66 %
Platelets: 330 10*3/uL (ref 150–450)
RBC: 3.97 x10E6/uL (ref 3.77–5.28)
RDW: 16.1 % — ABNORMAL HIGH (ref 11.7–15.4)
WBC: 5.3 10*3/uL (ref 3.4–10.8)

## 2023-09-25 LAB — B12 AND FOLATE PANEL
Folate: 19 ng/mL (ref >3.0)
Vitamin B-12: 648 pg/mL (ref 232–1245)

## 2023-09-25 LAB — FERRITIN: Ferritin: 19 ng/mL (ref 15–150)

## 2023-09-25 NOTE — Telephone Encounter (Signed)
 Tried to call patient on her cell phone and her phone number voicemail is full. Called patient husband number and talk to patient and she verbalized understanding. She states yes she tried to call them to set up appointment but she has not heard anything back from Dr. Adrian Alba office. Informed her it looks like they have schedule her to see dr. Adrian Alba tomorrow at 1:45pm and then the Infusion center at 2:15pm. She states that will work. She thanked us  for everything she is doing for her.

## 2023-09-25 NOTE — Telephone Encounter (Signed)
-----   Message from [facility] sent at 09/25/2023 11:15 AM EDT ----- Hemoglobin is at 10 and iron  levels are low, she will have iron  infusion arranged along with octreotide  injection at Dr. Layton Prima office  RV

## 2023-09-25 NOTE — Telephone Encounter (Signed)
 Patient wants an appt, her phone is not doing well and she will try to make sure it works. Please call [phone]

## 2023-09-26 ENCOUNTER — Inpatient Hospital Stay

## 2023-09-26 ENCOUNTER — Encounter: Payer: Self-pay | Admitting: Oncology

## 2023-09-26 ENCOUNTER — Inpatient Hospital Stay (HOSPITAL_BASED_OUTPATIENT_CLINIC_OR_DEPARTMENT_OTHER): Admitting: Oncology

## 2023-09-26 VITALS — BP 146/75 | HR 87 | Temp 97.2°F | Resp 16 | Ht 63.0 in | Wt 146.5 lb

## 2023-09-26 DIAGNOSIS — D509 Iron deficiency anemia, unspecified: Secondary | ICD-10-CM

## 2023-09-26 MED ORDER — IRON SUCROSE 20 MG/ML IV SOLN
200.0000 mg | Freq: Once | INTRAVENOUS | Status: AC
  Administered 2023-09-26: 200 mg via INTRAVENOUS
  Filled 2023-09-26: qty 10

## 2023-09-26 MED ORDER — OCTREOTIDE ACETATE 30 MG IM KIT
30.0000 mg | PACK | Freq: Once | INTRAMUSCULAR | Status: AC
  Administered 2023-09-26: 30 mg via INTRAMUSCULAR
  Filled 2023-09-26: qty 1

## 2023-09-26 NOTE — Progress Notes (Signed)
 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  INTERVAL HISTORY: Patient returns to clinic today for repeat laboratory work, further evaluation, continuation of octreotide , and consideration of additional blood.  She does not complained of any weakness or fatigue today.  She has no neurologic complaints. She denies any recent fevers or illnesses.  She has a fair appetite.  She denies any chest pain, shortness of breath, cough, or hemoptysis.  She denies any nausea, vomiting, constipation, or diarrhea.  She has noticed no melena or hematochezia.  She has no urinary complaints.  Patient offers no specific complaints today.  REVIEW OF SYSTEMS:   Review of Systems  Constitutional: Negative.  Negative for fever, malaise/fatigue and weight loss.  Respiratory: Negative.  Negative for cough and shortness of breath.   Cardiovascular: Negative.  Negative for chest pain and leg swelling.  Gastrointestinal: Negative.  Negative for abdominal pain, blood in stool and melena.  Genitourinary: Negative.  Negative for hematuria.  Musculoskeletal: Negative.  Negative for back pain.  Skin: Negative.  Negative for rash.  Neurological: Negative.  Negative for dizziness, focal weakness, weakness and headaches.  Psychiatric/Behavioral: Negative.  The patient is not nervous/anxious.     As per HPI. Otherwise, a complete review of systems is negative.  PAST MEDICAL HISTORY: Past Medical History:  Diagnosis Date   Anemia    Arthritis    Breast cancer (HCC) 1990   Right   COPD (chronic obstructive pulmonary disease) (HCC)    Dyspnea    DOE   Elevated lipids    Hypertension    Lower extremity edema     Migraines    Migraines    Personal history of radiation therapy 1990   Breast   Restless leg syndrome    Rotator cuff injury     PAST SURGICAL HISTORY: Past Surgical History:  Procedure Laterality Date   ABDOMINAL HYSTERECTOMY     BACK SURGERY  08/2017   CERVICAL FUSION   BREAST BIOPSY Right 1990   LUMPECTOMY.  took lymph nodes   BREAST LUMPECTOMY     COLONOSCOPY WITH PROPOFOL  N/A 07/20/2017   Procedure: COLONOSCOPY WITH PROPOFOL ;  Surgeon: Irby Mannan, MD;  Location: [facility] ENDOSCOPY;  Service: Endoscopy;  Laterality: N/A;   COLONOSCOPY WITH PROPOFOL  N/A 01/27/2022   Procedure: COLONOSCOPY WITH PROPOFOL ;  Surgeon: Luke Salaam, MD;  Location: [facility] ENDOSCOPY;  Service: Gastroenterology;  Laterality: N/A;   ENTEROSCOPY N/A 08/08/2017   Procedure: ENTEROSCOPY;  Surgeon: Selena Daily, MD;  Location: [facility] ENDOSCOPY;  Service: Gastroenterology;  Laterality: N/A;   ENTEROSCOPY N/A 05/07/2020   Procedure: ENTEROSCOPY with Adult colonoscope;  Surgeon: Irby Mannan, MD;  Location: [facility] ENDOSCOPY;  Service: Endoscopy;  Laterality: N/A;   ENTEROSCOPY N/A 03/17/2021   Procedure: ENTEROSCOPY;  Surgeon: Irby Mannan, MD;  Location: [facility] ENDOSCOPY;  Service: Endoscopy;  Laterality: N/A;  PUSH   ENTEROSCOPY N/A 07/13/2021   Procedure: ENTEROSCOPY;  Surgeon: Selena Daily, MD;  Location: [facility] ENDOSCOPY;  Service: Gastroenterology;  Laterality: N/A;   ENTEROSCOPY N/A 10/26/2021   Procedure: ENTEROSCOPY;  Surgeon: Selena Daily, MD;  Location: [facility] ENDOSCOPY;  Service: Gastroenterology;  Laterality: N/A;  push   ENTEROSCOPY N/A 07/27/2022   Procedure: ENTEROSCOPY;  Surgeon: Ole Berkeley,  Darren, MD;  Location: [facility] ENDOSCOPY;  Service: Endoscopy;  Laterality: N/A;  Push   ENTEROSCOPY N/A 06/29/2023   Procedure: ENTEROSCOPY;  Surgeon: Luke Salaam, MD;  Location: [facility] ENDOSCOPY;  Service: Gastroenterology;  Laterality: N/A;  Push   ENTEROSCOPY N/A 08/10/2023   Procedure:  ENTEROSCOPY;  Surgeon: Selena Daily, MD;  Location: [facility] ENDOSCOPY;  Service: Gastroenterology;  Laterality: N/A;  Push   ESOPHAGOGASTRODUODENOSCOPY Left 04/13/2017   Procedure: ESOPHAGOGASTRODUODENOSCOPY (EGD);  Surgeon: Irby Mannan, MD;  Location: [facility] ENDOSCOPY;  Service: Endoscopy;  Laterality: Left;   ESOPHAGOGASTRODUODENOSCOPY (EGD) WITH PROPOFOL  N/A 07/20/2017   Procedure: ESOPHAGOGASTRODUODENOSCOPY (EGD) WITH PROPOFOL ;  Surgeon: Irby Mannan, MD;  Location: [facility] ENDOSCOPY;  Service: Endoscopy;  Laterality: N/A;   ESOPHAGOGASTRODUODENOSCOPY (EGD) WITH PROPOFOL  N/A 12/12/2017   Procedure: ESOPHAGOGASTRODUODENOSCOPY (EGD) WITH PROPOFOL ;  Surgeon: Irby Mannan, MD;  Location: [facility] ENDOSCOPY;  Service: Endoscopy;  Laterality: N/A;   ESOPHAGOGASTRODUODENOSCOPY (EGD) WITH PROPOFOL  N/A 12/23/2020   Procedure: ESOPHAGOGASTRODUODENOSCOPY (EGD) WITH PROPOFOL ;  Surgeon: Irby Mannan, MD;  Location: [facility] ENDOSCOPY;  Service: Endoscopy;  Laterality: N/A;   ESOPHAGOGASTRODUODENOSCOPY (EGD) WITH PROPOFOL  N/A 01/27/2022   Procedure: ESOPHAGOGASTRODUODENOSCOPY (EGD) WITH PROPOFOL ;  Surgeon: Luke Salaam, MD;  Location: [facility] ENDOSCOPY;  Service: Gastroenterology;  Laterality: N/A;   GIVENS CAPSULE STUDY N/A 03/10/2021   Procedure: GIVENS CAPSULE STUDY;  Surgeon: Irby Mannan, MD;  Location: [facility] ENDOSCOPY;  Service: Endoscopy;  Laterality: N/A;   HOT HEMOSTASIS  06/29/2023   Procedure: HOT HEMOSTASIS (ARGON PLASMA COAGULATION/BICAP);  Surgeon: Luke Salaam, MD;  Location: [facility] ENDOSCOPY;  Service: Gastroenterology;;   HOT HEMOSTASIS  08/10/2023   Procedure: EGD, WITH ARGON PLASMA COAGULATION;  Surgeon: Selena Daily, MD;  Location: [facility] ENDOSCOPY;  Service: Gastroenterology;;   MEDIAL PARTIAL KNEE REPLACEMENT Left 1990   torn ligament. no knee replacement at that time   PARTIAL HYSTERECTOMY     SHOULDER ARTHROSCOPY WITH OPEN ROTATOR CUFF REPAIR Left 03/01/2016    Procedure: SHOULDER ARTHROSCOPY WITH OPEN ROTATOR CUFF REPAIR;  Surgeon: Rande Bushy, MD;  Location: [facility] ORS;  Service: Orthopedics;  Laterality: Left;   TOTAL KNEE ARTHROPLASTY Left 02/14/2018   Procedure: TOTAL KNEE ARTHROPLASTY;  Surgeon: Rande Bushy, MD;  Location: [facility] ORS;  Service: Orthopedics;  Laterality: Left;    FAMILY HISTORY: Family History  Problem Relation Age of Onset   CAD Mother    CAD Sister    Breast cancer Neg Hx     ADVANCED DIRECTIVES (Y/N):  N  HEALTH MAINTENANCE: Social History   Tobacco Use   Smoking status: Some Days    Current packs/day: 0.00    Average packs/day: 0.3 packs/day for 50.0 years (12.5 ttl pk-yrs)    Types: Cigarettes    Start date: 03/13/1966    Last attempt to quit: 03/13/2016    Years since quitting: 7.5   Smokeless tobacco: Never  Vaping Use   Vaping status: Never Used  Substance Use Topics   Alcohol use: No   Drug use: No     Colonoscopy:  PAP:  Bone density:  Lipid panel:  Allergies  Allergen Reactions   Latex Rash    Welts over body    Current Outpatient Medications  Medication Sig Dispense Refill   albuterol  (VENTOLIN  HFA) 108 (90 Base) MCG/ACT inhaler Inhale 2 puffs into the lungs every 6 (six) hours as needed for wheezing.     amitriptyline  (ELAVIL ) 25 MG tablet Take 25 mg by mouth at bedtime.     budesonide-formoterol  (SYMBICORT) 160-4.5  MCG/ACT inhaler Inhale 2 puffs into the lungs 2 (two) times daily.     furosemide  (LASIX ) 20 MG tablet Take 20 mg daily by mouth.      lisinopril  (PRINIVIL ,ZESTRIL ) 20 MG tablet Take 20 mg by mouth daily.     potassium chloride  (KLOR-CON  M) 10 MEQ tablet Take 10 mEq by mouth daily.     rOPINIRole  (REQUIP ) 2 MG tablet Take 2-4 mg by mouth at bedtime as needed (restless leg syndrome).     simvastatin  (ZOCOR ) 20 MG tablet Take 20 mg by mouth at bedtime.     SPIRIVA  HANDIHALER 18 MCG inhalation capsule Place 18 mcg into inhaler and inhale daily.     SUMAtriptan  6  MG/0.5ML SOAJ Inject into the skin.     vitamin B-12 (CYANOCOBALAMIN ) 1000 MCG tablet Take 1,000 mcg by mouth daily.     omeprazole  (PRILOSEC ) 40 MG capsule Take 1 capsule (40 mg total) by mouth daily. (Patient not taking: Reported on 09/26/2023) 90 capsule 3   No current facility-administered medications for this visit.    OBJECTIVE: Vitals:   09/26/23 1344  BP: (!) 146/75  Pulse: 87  Resp: 16  Temp: (!) 97.2 F (36.2 C)  SpO2: 97%      Body mass index is 25.95 kg/m.    ECOG FS:0 - Asymptomatic  General: Well-developed, well-nourished, no acute distress. Eyes: Pink conjunctiva, anicteric sclera. HEENT: Normocephalic, moist mucous membranes. Lungs: No audible wheezing or coughing. Heart: Regular rate and rhythm. Abdomen: Soft, nontender, no obvious distention. Musculoskeletal: No edema, cyanosis, or clubbing. Neuro: Alert, answering all questions appropriately. Cranial nerves grossly intact. Skin: No rashes or petechiae noted. Psych: Normal affect.  LAB RESULTS:  Lab Results  Component Value Date   NA 144 08/10/2023   K 3.7 08/10/2023   CL 109 08/10/2023   CO2 29 08/10/2023   GLUCOSE 104 (H) 08/10/2023   BUN 17 08/10/2023   CREATININE 0.68 08/10/2023   CALCIUM 8.2 (L) 08/10/2023   PROT 5.7 (L) 06/13/2023   ALBUMIN 3.3 (L) 06/13/2023   AST 17 06/13/2023   ALT 13 06/13/2023   ALKPHOS 28 (L) 06/13/2023   BILITOT 0.4 06/13/2023   GFRNONAA >60 08/10/2023   GFRAA >60 02/16/2018    Lab Results  Component Value Date   WBC 5.3 09/24/2023   NEUTROABS 3.5 09/24/2023   HGB 10.0 (L) 09/24/2023   HCT 32.6 (L) 09/24/2023   MCV 82 09/24/2023   PLT 330 09/24/2023   Lab Results  Component Value Date   IRON  21 (L) 04/17/2023   TIBC 427 04/17/2023   IRONPCTSAT 5 (L) 04/17/2023   Lab Results  Component Value Date   FERRITIN 19 09/24/2023     STUDIES: No results found.    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  Patient's hemoglobin has improved to 10.0.  She does not require transfusion this week.  Recommendation from GI is to pursue octreotide  every 2 weeks as scheduled for approximately 4 months.  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  The dose of thalidomide is 100 mg daily for a total of 4 months.  Unlikely to use thalidomide in this patient given its toxicity and unclear efficacy.  Return to  clinic in 2 weeks for repeat laboratory work, further evaluation, consideration of transfusion, and continuation of octreotide .   History of AVMs: EGD and octreotide  as above.  Continue follow-up with GI as scheduled.   Tendon repair: Patient recently tore a tendon in her right arm, but is unclear if and when she will need surgical repair. Coping/adjustment: Patient was previously given a referral to [facility].  I spent a total of 30 minutes reviewing chart data, face-to-face evaluation with the patient, counseling and coordination of care as detailed above.   Patient expressed understanding and was in agreement with this plan. She also understands that She can call clinic at any time with any questions, concerns, or complaints.    Shellie Dials, MD   09/29/2023 7:30 AM

## 2023-09-29 ENCOUNTER — Encounter: Payer: Self-pay | Admitting: Oncology

## 2023-10-11 ENCOUNTER — Encounter: Payer: Self-pay | Admitting: Oncology

## 2023-10-11 ENCOUNTER — Inpatient Hospital Stay: Admitting: Oncology

## 2023-10-11 ENCOUNTER — Inpatient Hospital Stay: Attending: Oncology

## 2023-10-11 ENCOUNTER — Inpatient Hospital Stay

## 2023-10-11 VITALS — BP 118/75 | HR 80 | Temp 97.2°F | Resp 16 | Ht 63.0 in | Wt 149.0 lb

## 2023-10-11 DIAGNOSIS — F17210 Nicotine dependence, cigarettes, uncomplicated: Secondary | ICD-10-CM | POA: Diagnosis not present

## 2023-10-11 DIAGNOSIS — D649 Anemia, unspecified: Secondary | ICD-10-CM

## 2023-10-11 DIAGNOSIS — Z7961 Long term (current) use of immunomodulator: Secondary | ICD-10-CM | POA: Diagnosis not present

## 2023-10-11 DIAGNOSIS — G2581 Restless legs syndrome: Secondary | ICD-10-CM | POA: Diagnosis not present

## 2023-10-11 DIAGNOSIS — D509 Iron deficiency anemia, unspecified: Secondary | ICD-10-CM | POA: Diagnosis present

## 2023-10-11 DIAGNOSIS — K922 Gastrointestinal hemorrhage, unspecified: Secondary | ICD-10-CM | POA: Diagnosis not present

## 2023-10-11 DIAGNOSIS — J449 Chronic obstructive pulmonary disease, unspecified: Secondary | ICD-10-CM | POA: Insufficient documentation

## 2023-10-11 DIAGNOSIS — I10 Essential (primary) hypertension: Secondary | ICD-10-CM | POA: Insufficient documentation

## 2023-10-11 DIAGNOSIS — Z79899 Other long term (current) drug therapy: Secondary | ICD-10-CM | POA: Insufficient documentation

## 2023-10-11 LAB — IRON AND TIBC
Iron: 41 ug/dL (ref 28–170)
Saturation Ratios: 10 % — ABNORMAL LOW (ref 10.4–31.8)
TIBC: 412 ug/dL (ref 250–450)
UIBC: 371 ug/dL

## 2023-10-11 LAB — CBC (CANCER CENTER ONLY)
HCT: 29 % — ABNORMAL LOW (ref 36.0–46.0)
Hemoglobin: 9 g/dL — ABNORMAL LOW (ref 12.0–15.0)
MCH: 25.3 pg — ABNORMAL LOW (ref 26.0–34.0)
MCHC: 31 g/dL (ref 30.0–36.0)
MCV: 81.5 fL (ref 80.0–100.0)
Platelet Count: 414 10*3/uL — ABNORMAL HIGH (ref 150–400)
RBC: 3.56 MIL/uL — ABNORMAL LOW (ref 3.87–5.11)
RDW: 20.6 % — ABNORMAL HIGH (ref 11.5–15.5)
WBC Count: 4.7 10*3/uL (ref 4.0–10.5)
nRBC: 0 % (ref 0.0–0.2)

## 2023-10-11 LAB — SAMPLE TO BLOOD BANK

## 2023-10-11 LAB — RETICULOCYTES
Immature Retic Fract: 22.9 % — ABNORMAL HIGH (ref 2.3–15.9)
RBC.: 3.59 MIL/uL — ABNORMAL LOW (ref 3.87–5.11)
Retic Count, Absolute: 110.2 10*3/uL (ref 19.0–186.0)
Retic Ct Pct: 3.1 % (ref 0.4–3.1)

## 2023-10-11 LAB — FOLATE: Folate: 15.1 ng/mL (ref >5.9)

## 2023-10-11 LAB — VITAMIN B12: Vitamin B-12: 268 pg/mL (ref 180–914)

## 2023-10-11 LAB — FERRITIN: Ferritin: 28 ng/mL (ref 11–307)

## 2023-10-11 LAB — LACTATE DEHYDROGENASE: LDH: 182 U/L (ref 98–192)

## 2023-10-11 MED ORDER — IRON SUCROSE 20 MG/ML IV SOLN
200.0000 mg | Freq: Once | INTRAVENOUS | Status: AC
  Administered 2023-10-11: 200 mg via INTRAVENOUS

## 2023-10-11 MED ORDER — SODIUM CHLORIDE 0.9% FLUSH
10.0000 mL | Freq: Once | INTRAVENOUS | Status: AC | PRN
  Administered 2023-10-11: 10 mL
  Filled 2023-10-11: qty 10

## 2023-10-11 MED ORDER — OCTREOTIDE ACETATE 30 MG IM KIT
30.0000 mg | PACK | Freq: Once | INTRAMUSCULAR | Status: AC
  Administered 2023-10-11: 30 mg via INTRAMUSCULAR

## 2023-10-11 NOTE — Progress Notes (Signed)
 Recommendation from GI is to pursue octreotide  every 2 weeks as scheduled for approximately 4 months. 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 The dose of thalidomide is 100 mg daily for a total of 4 months. Unlikely to use thalidomide in this patient given its toxicity and unclear efficacy. Return to clinic in 2 weeks for repeat laboratory work, further evaluation, consideration of transfusion, and continuation of octreotide .

## 2023-10-11 NOTE — Progress Notes (Signed)
 Patient to receive Sandostatin  LAR 30 mg q 2weeks Per GI MD

## 2023-10-11 NOTE — Progress Notes (Signed)
 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  INTERVAL HISTORY: Patient returns to clinic today for repeat laboratory work, further evaluation, and duration of treatment.  She admits to more fatigue today, but otherwise feels well.  She has no neurologic complaints. She denies any recent fevers or illnesses.  She has a fair appetite.  She denies any chest pain, shortness of breath, cough, or hemoptysis.  She denies any nausea, vomiting, constipation, or diarrhea.  She has noticed no melena or hematochezia.  She has no urinary complaints.  Patient offers no further specific complaints today.  REVIEW OF SYSTEMS:   Review of Systems  Constitutional: Negative.  Negative for fever, malaise/fatigue and weight loss.  Respiratory: Negative.  Negative for cough and shortness of breath.   Cardiovascular: Negative.  Negative for chest pain and leg swelling.  Gastrointestinal: Negative.  Negative for abdominal pain, blood in stool and melena.  Genitourinary: Negative.  Negative for hematuria.  Musculoskeletal: Negative.  Negative for back pain.  Skin: Negative.  Negative for rash.  Neurological: Negative.  Negative for dizziness, focal weakness, weakness and headaches.  Psychiatric/Behavioral: Negative.  The patient is not nervous/anxious.     As per HPI. Otherwise, a complete review of systems is negative.  PAST MEDICAL HISTORY: Past Medical History:  Diagnosis Date   Anemia    Arthritis    Breast cancer (HCC) 1990   Right   COPD (chronic obstructive pulmonary disease) (HCC)    Dyspnea    DOE   Elevated lipids    Hypertension    Lower extremity edema    Migraines    Migraines     Personal history of radiation therapy 1990   Breast   Restless leg syndrome    Rotator cuff injury     PAST SURGICAL HISTORY: Past Surgical History:  Procedure Laterality Date   ABDOMINAL HYSTERECTOMY     BACK SURGERY  08/2017   CERVICAL FUSION   BREAST BIOPSY Right 1990   LUMPECTOMY.  took lymph nodes   BREAST LUMPECTOMY     COLONOSCOPY WITH PROPOFOL  N/A 07/20/2017   Procedure: COLONOSCOPY WITH PROPOFOL ;  Surgeon: Irby Mannan, MD;  Location: [facility] ENDOSCOPY;  Service: Endoscopy;  Laterality: N/A;   COLONOSCOPY WITH PROPOFOL  N/A 01/27/2022   Procedure: COLONOSCOPY WITH PROPOFOL ;  Surgeon: Luke Salaam, MD;  Location: [facility] ENDOSCOPY;  Service: Gastroenterology;  Laterality: N/A;   ENTEROSCOPY N/A 08/08/2017   Procedure: ENTEROSCOPY;  Surgeon: Selena Daily, MD;  Location: [facility] ENDOSCOPY;  Service: Gastroenterology;  Laterality: N/A;   ENTEROSCOPY N/A 05/07/2020   Procedure: ENTEROSCOPY with Adult colonoscope;  Surgeon: Irby Mannan, MD;  Location: [facility] ENDOSCOPY;  Service: Endoscopy;  Laterality: N/A;   ENTEROSCOPY N/A 03/17/2021   Procedure: ENTEROSCOPY;  Surgeon: Irby Mannan, MD;  Location: [facility] ENDOSCOPY;  Service: Endoscopy;  Laterality: N/A;  PUSH   ENTEROSCOPY N/A 07/13/2021   Procedure: ENTEROSCOPY;  Surgeon: Selena Daily, MD;  Location: [facility] ENDOSCOPY;  Service: Gastroenterology;  Laterality: N/A;   ENTEROSCOPY N/A 10/26/2021   Procedure: ENTEROSCOPY;  Surgeon: Selena Daily, MD;  Location: [facility] ENDOSCOPY;  Service: Gastroenterology;  Laterality: N/A;  push   ENTEROSCOPY N/A 07/27/2022   Procedure: ENTEROSCOPY;  Surgeon: Marnee Sink, MD;  Location: [facility] ENDOSCOPY;  Service: Endoscopy;  Laterality: N/A;  Push   ENTEROSCOPY N/A 06/29/2023   Procedure: ENTEROSCOPY;  Surgeon: Luke Salaam, MD;  Location: [facility] ENDOSCOPY;  Service: Gastroenterology;  Laterality: N/A;  Push   ENTEROSCOPY N/A 08/10/2023   Procedure: ENTEROSCOPY;  Surgeon: Selena Daily, MD;  Location: [facility] ENDOSCOPY;  Service: Gastroenterology;  Laterality: N/A;  Push   ESOPHAGOGASTRODUODENOSCOPY Left 04/13/2017   Procedure: ESOPHAGOGASTRODUODENOSCOPY (EGD);  Surgeon: Irby Mannan, MD;  Location: [facility] ENDOSCOPY;  Service: Endoscopy;  Laterality: Left;   ESOPHAGOGASTRODUODENOSCOPY (EGD) WITH PROPOFOL  N/A 07/20/2017   Procedure: ESOPHAGOGASTRODUODENOSCOPY (EGD) WITH PROPOFOL ;  Surgeon: Irby Mannan, MD;  Location: [facility] ENDOSCOPY;  Service: Endoscopy;  Laterality: N/A;   ESOPHAGOGASTRODUODENOSCOPY (EGD) WITH PROPOFOL  N/A 12/12/2017   Procedure: ESOPHAGOGASTRODUODENOSCOPY (EGD) WITH PROPOFOL ;  Surgeon: Irby Mannan, MD;  Location: [facility] ENDOSCOPY;  Service: Endoscopy;  Laterality: N/A;   ESOPHAGOGASTRODUODENOSCOPY (EGD) WITH PROPOFOL  N/A 12/23/2020   Procedure: ESOPHAGOGASTRODUODENOSCOPY (EGD) WITH PROPOFOL ;  Surgeon: Irby Mannan, MD;  Location: [facility] ENDOSCOPY;  Service: Endoscopy;  Laterality: N/A;   ESOPHAGOGASTRODUODENOSCOPY (EGD) WITH PROPOFOL  N/A 01/27/2022   Procedure: ESOPHAGOGASTRODUODENOSCOPY (EGD) WITH PROPOFOL ;  Surgeon: Luke Salaam, MD;  Location: [facility] ENDOSCOPY;  Service: Gastroenterology;  Laterality: N/A;   GIVENS CAPSULE STUDY N/A 03/10/2021   Procedure: GIVENS CAPSULE STUDY;  Surgeon: Irby Mannan, MD;  Location: [facility] ENDOSCOPY;  Service: Endoscopy;  Laterality: N/A;   HOT HEMOSTASIS  06/29/2023   Procedure: HOT HEMOSTASIS (ARGON PLASMA COAGULATION/BICAP);  Surgeon: Luke Salaam, MD;  Location: [facility] ENDOSCOPY;  Service: Gastroenterology;;   HOT HEMOSTASIS  08/10/2023   Procedure: EGD, WITH ARGON PLASMA COAGULATION;  Surgeon: Selena Daily, MD;  Location: [facility] ENDOSCOPY;  Service: Gastroenterology;;   MEDIAL PARTIAL KNEE REPLACEMENT Left 1990   torn ligament. no knee replacement at that time   PARTIAL HYSTERECTOMY     SHOULDER ARTHROSCOPY WITH OPEN ROTATOR CUFF REPAIR Left 03/01/2016   Procedure: SHOULDER  ARTHROSCOPY WITH OPEN ROTATOR CUFF REPAIR;  Surgeon: Rande Bushy, MD;  Location: [facility] ORS;  Service: Orthopedics;  Laterality: Left;   TOTAL KNEE ARTHROPLASTY Left 02/14/2018   Procedure: TOTAL KNEE ARTHROPLASTY;  Surgeon: Rande Bushy, MD;  Location: [facility] ORS;  Service: Orthopedics;  Laterality: Left;    FAMILY HISTORY: Family History  Problem Relation Age of Onset   CAD Mother    CAD Sister    Breast cancer Neg Hx     ADVANCED DIRECTIVES (Y/N):  N  HEALTH MAINTENANCE: Social History   Tobacco Use   Smoking status: Some Days    Current packs/day: 0.00    Average packs/day: 0.3 packs/day for 50.0 years (12.5 ttl pk-yrs)    Types: Cigarettes    Start date: 03/13/1966    Last attempt to quit: 03/13/2016    Years since quitting: 7.5   Smokeless tobacco: Never  Vaping Use   Vaping status: Never Used  Substance Use Topics   Alcohol use: No   Drug use: No     Colonoscopy:  PAP:  Bone density:  Lipid panel:  Allergies  Allergen Reactions   Latex Rash    Welts over body    Current Outpatient Medications  Medication Sig Dispense Refill   albuterol  (VENTOLIN  HFA) 108 (90 Base) MCG/ACT inhaler Inhale 2 puffs into the lungs every 6 (six) hours as needed for wheezing.     amitriptyline  (ELAVIL ) 25 MG tablet Take 25 mg by mouth at bedtime.     budesonide-formoterol  (SYMBICORT) 160-4.5 MCG/ACT inhaler Inhale  2 puffs into the lungs 2 (two) times daily.     furosemide  (LASIX ) 20 MG tablet Take 20 mg daily by mouth.      lisinopril  (PRINIVIL ,ZESTRIL ) 20 MG tablet Take 20 mg by mouth daily.     potassium chloride  (KLOR-CON  M) 10 MEQ tablet Take 10 mEq by mouth daily.     rOPINIRole  (REQUIP ) 2 MG tablet Take 2-4 mg by mouth at bedtime as needed (restless leg syndrome).     simvastatin  (ZOCOR ) 20 MG tablet Take 20 mg by mouth at bedtime.     SPIRIVA  HANDIHALER 18 MCG inhalation capsule Place 18 mcg into inhaler and inhale daily.     SUMAtriptan  6 MG/0.5ML SOAJ Inject into the  skin.     vitamin B-12 (CYANOCOBALAMIN ) 1000 MCG tablet Take 1,000 mcg by mouth daily.     omeprazole  (PRILOSEC ) 40 MG capsule Take 1 capsule (40 mg total) by mouth daily. (Patient not taking: Reported on 08/09/2023) 90 capsule 3   No current facility-administered medications for this visit.    OBJECTIVE: Vitals:   10/11/23 1006  BP: 118/75  Pulse: 80  Resp: 16  Temp: (!) 97.2 F (36.2 C)  SpO2: 100%      Body mass index is 26.39 kg/m.    ECOG FS:0 - Asymptomatic  General: Well-developed, well-nourished, no acute distress. Eyes: Pink conjunctiva, anicteric sclera. HEENT: Normocephalic, moist mucous membranes. Lungs: No audible wheezing or coughing. Heart: Regular rate and rhythm. Abdomen: Soft, nontender, no obvious distention. Musculoskeletal: No edema, cyanosis, or clubbing. Neuro: Alert, answering all questions appropriately. Cranial nerves grossly intact. Skin: No rashes or petechiae noted. Psych: Normal affect.  LAB RESULTS:  Lab Results  Component Value Date   NA 144 08/10/2023   K 3.7 08/10/2023   CL 109 08/10/2023   CO2 29 08/10/2023   GLUCOSE 104 (H) 08/10/2023   BUN 17 08/10/2023   CREATININE 0.68 08/10/2023   CALCIUM 8.2 (L) 08/10/2023   PROT 5.7 (L) 06/13/2023   ALBUMIN 3.3 (L) 06/13/2023   AST 17 06/13/2023   ALT 13 06/13/2023   ALKPHOS 28 (L) 06/13/2023   BILITOT 0.4 06/13/2023   GFRNONAA >60 08/10/2023   GFRAA >60 02/16/2018    Lab Results  Component Value Date   WBC 4.7 10/11/2023   NEUTROABS 3.5 09/24/2023   HGB 9.0 (L) 10/11/2023   HCT 29.0 (L) 10/11/2023   MCV 81.5 10/11/2023   PLT 414 (H) 10/11/2023   Lab Results  Component Value Date   IRON  41 10/11/2023   TIBC 412 10/11/2023   IRONPCTSAT 10 (L) 10/11/2023   Lab Results  Component Value Date   FERRITIN 28 10/11/2023     STUDIES: No results found.    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  Patient's hemoglobin has trended down to 9.0.  She does not require transfusion, but we will proceed with Venofer  today.  Recommendation from GI is to pursue octreotide  every 2 weeks as scheduled for approximately 4 months.  The every 2-week treatment of octreotide  was started on August 20, 2023.  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  The dose of thalidomide is 100 mg daily for a total of 4 months.  Unlikely  to use thalidomide in this patient given its toxicity and unclear efficacy.  Proceed with octreotide  and Venofer  today.  Return to clinic in 2 weeks for further evaluation and continuation of treatment.   History of AVMs: EGD and octreotide  as above.  Continue follow-up with GI as scheduled.   Tendon repair: Patient recently tore a tendon in her right arm, but is unclear if and when she will need surgical repair. Coping/adjustment: Patient was previously given a referral to [facility].  I spent a total of 30 minutes reviewing chart data, face-to-face evaluation with the patient, counseling and coordination of care as detailed above.   Patient expressed understanding and was in agreement with this plan. She also understands that She can call clinic at any time with any questions, concerns, or complaints.    Shellie Dials, MD   10/11/2023 4:30 PM

## 2023-10-12 LAB — HAPTOGLOBIN: Haptoglobin: 50 mg/dL (ref 42–346)

## 2023-10-14 LAB — ERYTHROPOIETIN: Erythropoietin: 49 m[IU]/mL — ABNORMAL HIGH (ref 2.6–18.5)

## 2023-10-19 ENCOUNTER — Encounter: Payer: Self-pay | Admitting: Oncology

## 2023-10-19 NOTE — Progress Notes (Signed)
 Patient enrolled in [facility] services on 10/17/2023.  Patient is scheduled for initial behavioral health evaluation on 10/31/2023.

## 2023-10-25 ENCOUNTER — Inpatient Hospital Stay

## 2023-10-25 ENCOUNTER — Encounter: Payer: Self-pay | Admitting: Oncology

## 2023-10-25 ENCOUNTER — Inpatient Hospital Stay (HOSPITAL_BASED_OUTPATIENT_CLINIC_OR_DEPARTMENT_OTHER): Admitting: Oncology

## 2023-10-25 VITALS — BP 148/83 | HR 71 | Resp 16

## 2023-10-25 VITALS — BP 138/81 | HR 72 | Temp 97.4°F | Resp 16 | Wt 151.5 lb

## 2023-10-25 DIAGNOSIS — D509 Iron deficiency anemia, unspecified: Secondary | ICD-10-CM

## 2023-10-25 DIAGNOSIS — D649 Anemia, unspecified: Secondary | ICD-10-CM

## 2023-10-25 LAB — CBC WITH DIFFERENTIAL (CANCER CENTER ONLY)
Abs Immature Granulocytes: 0.02 10*3/uL (ref 0.00–0.07)
Basophils Absolute: 0 10*3/uL (ref 0.0–0.1)
Basophils Relative: 1 %
Eosinophils Absolute: 0.1 10*3/uL (ref 0.0–0.5)
Eosinophils Relative: 2 %
HCT: 27.5 % — ABNORMAL LOW (ref 36.0–46.0)
Hemoglobin: 8.5 g/dL — ABNORMAL LOW (ref 12.0–15.0)
Immature Granulocytes: 1 %
Lymphocytes Relative: 24 %
Lymphs Abs: 1.1 10*3/uL (ref 0.7–4.0)
MCH: 25.2 pg — ABNORMAL LOW (ref 26.0–34.0)
MCHC: 30.9 g/dL (ref 30.0–36.0)
MCV: 81.6 fL (ref 80.0–100.0)
Monocytes Absolute: 0.3 10*3/uL (ref 0.1–1.0)
Monocytes Relative: 6 %
Neutro Abs: 2.9 10*3/uL (ref 1.7–7.7)
Neutrophils Relative %: 66 %
Platelet Count: 256 10*3/uL (ref 150–400)
RBC: 3.37 MIL/uL — ABNORMAL LOW (ref 3.87–5.11)
RDW: 20.6 % — ABNORMAL HIGH (ref 11.5–15.5)
WBC Count: 4.4 10*3/uL (ref 4.0–10.5)
nRBC: 0 % (ref 0.0–0.2)

## 2023-10-25 LAB — SAMPLE TO BLOOD BANK

## 2023-10-25 MED ORDER — OCTREOTIDE ACETATE 30 MG IM KIT
30.0000 mg | PACK | Freq: Once | INTRAMUSCULAR | Status: AC
  Administered 2023-10-25: 30 mg via INTRAMUSCULAR

## 2023-10-25 MED ORDER — IRON SUCROSE 20 MG/ML IV SOLN
200.0000 mg | Freq: Once | INTRAVENOUS | Status: AC
  Administered 2023-10-25: 200 mg via INTRAVENOUS

## 2023-10-25 NOTE — Progress Notes (Signed)
 [facility]  Telephone:([phone]) [phone] Fax:(336) [phone]  ID: Hailey Farrell OB: December 04, 1950  MR#: [id number]  EXB#:[id number]  Patient Care Team: Dionicia Frater, MD as PCP - General (Family Medicine) Shellie Dials, MD as Consulting Physician (Oncology)  CHIEF COMPLAINT: Iron  deficiency anemia secondary to AVM/GI bleed.  INTERVAL HISTORY: Patient returns to clinic today for repeat laboratory work, further evaluation, and continuation of treatment.  She currently feels well and is asymptomatic.  She does not complain of any fatigue today. She has no neurologic complaints. She denies any recent fevers or illnesses.  She has a fair appetite.  She denies any chest pain, shortness of breath, cough, or hemoptysis.  She denies any nausea, vomiting, constipation, or diarrhea.  She has noticed no melena or hematochezia.  She has no urinary complaints.  Patient offers no specific complaints today.  REVIEW OF SYSTEMS:   Review of Systems  Constitutional: Negative.  Negative for fever, malaise/fatigue and weight loss.  Respiratory: Negative.  Negative for cough and shortness of breath.   Cardiovascular: Negative.  Negative for chest pain and leg swelling.  Gastrointestinal: Negative.  Negative for abdominal pain, blood in stool and melena.  Genitourinary: Negative.  Negative for hematuria.  Musculoskeletal: Negative.  Negative for back pain.  Skin: Negative.  Negative for rash.  Neurological: Negative.  Negative for dizziness, focal weakness, weakness and headaches.  Psychiatric/Behavioral: Negative.  The patient is not nervous/anxious.     As per HPI. Otherwise, a complete review of systems is negative.  PAST MEDICAL HISTORY: Past Medical History:  Diagnosis Date   Anemia    Arthritis    Breast cancer (HCC) 1990   Right   COPD (chronic obstructive pulmonary disease) (HCC)    Dyspnea    DOE   Elevated lipids    Hypertension    Lower extremity edema     Migraines    Migraines    Personal history of radiation therapy 1990   Breast   Restless leg syndrome    Rotator cuff injury     PAST SURGICAL HISTORY: Past Surgical History:  Procedure Laterality Date   ABDOMINAL HYSTERECTOMY     BACK SURGERY  08/2017   CERVICAL FUSION   BREAST BIOPSY Right 1990   LUMPECTOMY.  took lymph nodes   BREAST LUMPECTOMY     COLONOSCOPY WITH PROPOFOL  N/A 07/20/2017   Procedure: COLONOSCOPY WITH PROPOFOL ;  Surgeon: Irby Mannan, MD;  Location: [facility] ENDOSCOPY;  Service: Endoscopy;  Laterality: N/A;   COLONOSCOPY WITH PROPOFOL  N/A 01/27/2022   Procedure: COLONOSCOPY WITH PROPOFOL ;  Surgeon: Luke Salaam, MD;  Location: [facility] ENDOSCOPY;  Service: Gastroenterology;  Laterality: N/A;   ENTEROSCOPY N/A 08/08/2017   Procedure: ENTEROSCOPY;  Surgeon: Selena Daily, MD;  Location: [facility] ENDOSCOPY;  Service: Gastroenterology;  Laterality: N/A;   ENTEROSCOPY N/A 05/07/2020   Procedure: ENTEROSCOPY with Adult colonoscope;  Surgeon: Irby Mannan, MD;  Location: [facility] ENDOSCOPY;  Service: Endoscopy;  Laterality: N/A;   ENTEROSCOPY N/A 03/17/2021   Procedure: ENTEROSCOPY;  Surgeon: Irby Mannan, MD;  Location: [facility] ENDOSCOPY;  Service: Endoscopy;  Laterality: N/A;  PUSH   ENTEROSCOPY N/A 07/13/2021   Procedure: ENTEROSCOPY;  Surgeon: Selena Daily, MD;  Location: [facility] ENDOSCOPY;  Service: Gastroenterology;  Laterality: N/A;   ENTEROSCOPY N/A 10/26/2021   Procedure: ENTEROSCOPY;  Surgeon: Selena Daily, MD;  Location: [facility] ENDOSCOPY;  Service: Gastroenterology;  Laterality: N/A;  push   ENTEROSCOPY N/A 07/27/2022   Procedure: ENTEROSCOPY;  Surgeon:  Marnee Sink, MD;  Location: [facility] ENDOSCOPY;  Service: Endoscopy;  Laterality: N/A;  Push   ENTEROSCOPY N/A 06/29/2023   Procedure: ENTEROSCOPY;  Surgeon: Luke Salaam, MD;  Location: [facility] ENDOSCOPY;  Service: Gastroenterology;  Laterality: N/A;  Push   ENTEROSCOPY N/A 08/10/2023   Procedure:  ENTEROSCOPY;  Surgeon: Selena Daily, MD;  Location: [facility] ENDOSCOPY;  Service: Gastroenterology;  Laterality: N/A;  Push   ESOPHAGOGASTRODUODENOSCOPY Left 04/13/2017   Procedure: ESOPHAGOGASTRODUODENOSCOPY (EGD);  Surgeon: Irby Mannan, MD;  Location: [facility] ENDOSCOPY;  Service: Endoscopy;  Laterality: Left;   ESOPHAGOGASTRODUODENOSCOPY (EGD) WITH PROPOFOL  N/A 07/20/2017   Procedure: ESOPHAGOGASTRODUODENOSCOPY (EGD) WITH PROPOFOL ;  Surgeon: Irby Mannan, MD;  Location: [facility] ENDOSCOPY;  Service: Endoscopy;  Laterality: N/A;   ESOPHAGOGASTRODUODENOSCOPY (EGD) WITH PROPOFOL  N/A 12/12/2017   Procedure: ESOPHAGOGASTRODUODENOSCOPY (EGD) WITH PROPOFOL ;  Surgeon: Irby Mannan, MD;  Location: [facility] ENDOSCOPY;  Service: Endoscopy;  Laterality: N/A;   ESOPHAGOGASTRODUODENOSCOPY (EGD) WITH PROPOFOL  N/A 12/23/2020   Procedure: ESOPHAGOGASTRODUODENOSCOPY (EGD) WITH PROPOFOL ;  Surgeon: Irby Mannan, MD;  Location: [facility] ENDOSCOPY;  Service: Endoscopy;  Laterality: N/A;   ESOPHAGOGASTRODUODENOSCOPY (EGD) WITH PROPOFOL  N/A 01/27/2022   Procedure: ESOPHAGOGASTRODUODENOSCOPY (EGD) WITH PROPOFOL ;  Surgeon: Luke Salaam, MD;  Location: [facility] ENDOSCOPY;  Service: Gastroenterology;  Laterality: N/A;   GIVENS CAPSULE STUDY N/A 03/10/2021   Procedure: GIVENS CAPSULE STUDY;  Surgeon: Irby Mannan, MD;  Location: [facility] ENDOSCOPY;  Service: Endoscopy;  Laterality: N/A;   HOT HEMOSTASIS  06/29/2023   Procedure: HOT HEMOSTASIS (ARGON PLASMA COAGULATION/BICAP);  Surgeon: Luke Salaam, MD;  Location: [facility] ENDOSCOPY;  Service: Gastroenterology;;   HOT HEMOSTASIS  08/10/2023   Procedure: EGD, WITH ARGON PLASMA COAGULATION;  Surgeon: Selena Daily, MD;  Location: [facility] ENDOSCOPY;  Service: Gastroenterology;;   MEDIAL PARTIAL KNEE REPLACEMENT Left 1990   torn ligament. no knee replacement at that time   PARTIAL HYSTERECTOMY     SHOULDER ARTHROSCOPY WITH OPEN ROTATOR CUFF REPAIR Left 03/01/2016    Procedure: SHOULDER ARTHROSCOPY WITH OPEN ROTATOR CUFF REPAIR;  Surgeon: Rande Bushy, MD;  Location: [facility] ORS;  Service: Orthopedics;  Laterality: Left;   TOTAL KNEE ARTHROPLASTY Left 02/14/2018   Procedure: TOTAL KNEE ARTHROPLASTY;  Surgeon: Rande Bushy, MD;  Location: [facility] ORS;  Service: Orthopedics;  Laterality: Left;    FAMILY HISTORY: Family History  Problem Relation Age of Onset   CAD Mother    CAD Sister    Breast cancer Neg Hx     ADVANCED DIRECTIVES (Y/N):  N  HEALTH MAINTENANCE: Social History   Tobacco Use   Smoking status: Some Days    Current packs/day: 0.00    Average packs/day: 0.3 packs/day for 50.0 years (12.5 ttl pk-yrs)    Types: Cigarettes    Start date: 03/13/1966    Last attempt to quit: 03/13/2016    Years since quitting: 7.6   Smokeless tobacco: Never  Vaping Use   Vaping status: Never Used  Substance Use Topics   Alcohol use: No   Drug use: No     Colonoscopy:  PAP:  Bone density:  Lipid panel:  Allergies  Allergen Reactions   Latex Rash    Welts over body    Current Outpatient Medications  Medication Sig Dispense Refill   albuterol  (VENTOLIN  HFA) 108 (90 Base) MCG/ACT inhaler Inhale 2 puffs into the lungs every 6 (six) hours as needed for wheezing.     amitriptyline  (ELAVIL ) 25 MG tablet Take 25 mg by mouth at bedtime.     budesonide-formoterol  (SYMBICORT)  160-4.5 MCG/ACT inhaler Inhale 2 puffs into the lungs 2 (two) times daily.     furosemide  (LASIX ) 20 MG tablet Take 20 mg daily by mouth.      lisinopril  (PRINIVIL ,ZESTRIL ) 20 MG tablet Take 20 mg by mouth daily.     potassium chloride  (KLOR-CON  M) 10 MEQ tablet Take 10 mEq by mouth daily.     rOPINIRole  (REQUIP ) 2 MG tablet Take 2-4 mg by mouth at bedtime as needed (restless leg syndrome).     simvastatin  (ZOCOR ) 20 MG tablet Take 20 mg by mouth at bedtime.     SPIRIVA  HANDIHALER 18 MCG inhalation capsule Place 18 mcg into inhaler and inhale daily.     SUMAtriptan  6  MG/0.5ML SOAJ Inject into the skin.     vitamin B-12 (CYANOCOBALAMIN ) 1000 MCG tablet Take 1,000 mcg by mouth daily.     omeprazole  (PRILOSEC ) 40 MG capsule Take 1 capsule (40 mg total) by mouth daily. (Patient not taking: Reported on 10/25/2023) 90 capsule 3   No current facility-administered medications for this visit.    OBJECTIVE: Vitals:   10/25/23 0843  BP: 138/81  Pulse: 72  Resp: 16  Temp: (!) 97.4 F (36.3 C)  SpO2: 100%      Body mass index is 26.84 kg/m.    ECOG FS:0 - Asymptomatic  General: Well-developed, well-nourished, no acute distress. Eyes: Pink conjunctiva, anicteric sclera. HEENT: Normocephalic, moist mucous membranes. Lungs: No audible wheezing or coughing. Heart: Regular rate and rhythm. Abdomen: Soft, nontender, no obvious distention. Musculoskeletal: No edema, cyanosis, or clubbing. Neuro: Alert, answering all questions appropriately. Cranial nerves grossly intact. Skin: No rashes or petechiae noted. Psych: Normal affect.  LAB RESULTS:  Lab Results  Component Value Date   NA 144 08/10/2023   K 3.7 08/10/2023   CL 109 08/10/2023   CO2 29 08/10/2023   GLUCOSE 104 (H) 08/10/2023   BUN 17 08/10/2023   CREATININE 0.68 08/10/2023   CALCIUM 8.2 (L) 08/10/2023   PROT 5.7 (L) 06/13/2023   ALBUMIN 3.3 (L) 06/13/2023   AST 17 06/13/2023   ALT 13 06/13/2023   ALKPHOS 28 (L) 06/13/2023   BILITOT 0.4 06/13/2023   GFRNONAA >60 08/10/2023   GFRAA >60 02/16/2018    Lab Results  Component Value Date   WBC 4.4 10/25/2023   NEUTROABS 2.9 10/25/2023   HGB 8.5 (L) 10/25/2023   HCT 27.5 (L) 10/25/2023   MCV 81.6 10/25/2023   PLT 256 10/25/2023   Lab Results  Component Value Date   IRON  41 10/11/2023   TIBC 412 10/11/2023   IRONPCTSAT 10 (L) 10/11/2023   Lab Results  Component Value Date   FERRITIN 28 10/11/2023     STUDIES: No results found.    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  Patient's hemoglobin continues to slowly trend down and is now 8.5.  Will hold transfusion once again and proceed with octreotide  and Venofer  only today.  Recommendation from GI is to pursue octreotide  every 2 weeks as scheduled for approximately 4 months.  The every 2-week treatment of octreotide  was started on August 20, 2023.  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  The dose of thalidomide is 100 mg daily  for a total of 4 months.  Unlikely to use thalidomide in this patient given its toxicity and unclear efficacy.  Return to clinic in 2 weeks for further evaluation and continuation of treatment. History of AVMs: EGD and octreotide  as above.  Continue follow-up with GI as scheduled.   Tendon repair: Improved. Coping/adjustment: Patient was previously given a referral to [facility].  I spent a total of 30 minutes reviewing chart data, face-to-face evaluation with the patient, counseling and coordination of care as detailed above.    Patient expressed understanding and was in agreement with this plan. She also understands that She can call clinic at any time with any questions, concerns, or complaints.    Shellie Dials, MD   10/25/2023 8:49 AM

## 2023-10-25 NOTE — Addendum Note (Signed)
 Addended by: Margreta Sheriff on: 10/25/2023 09:30 AM   Modules accepted: Orders

## 2023-10-25 NOTE — Patient Instructions (Signed)
 Iron Sucrose Injection What is this medication? IRON SUCROSE (EYE ern SOO krose) treats low levels of iron (iron deficiency anemia) in people with kidney disease. Iron is a mineral that plays an important role in making red blood cells, which carry oxygen from your lungs to the rest of your body. This medicine may be used for other purposes; ask your health care provider or pharmacist if you have questions. COMMON BRAND NAME(S): Venofer What should I tell my care team before I take this medication? They need to know if you have any of these conditions: Anemia not caused by low iron levels Heart disease High levels of iron in the blood Kidney disease Liver disease An unusual or allergic reaction to iron, other medications, foods, dyes, or preservatives Pregnant or trying to get pregnant Breastfeeding How should I use this medication? This medication is for infusion into a vein. It is given in a hospital or clinic setting. Talk to your care team about the use of this medication in children. While this medication may be prescribed for children as young as 2 years for selected conditions, precautions do apply. Overdosage: If you think you have taken too much of this medicine contact a poison control center or emergency room at once. NOTE: This medicine is only for you. Do not share this medicine with others. What if I miss a dose? Keep appointments for follow-up doses. It is important not to miss your dose. Call your care team if you are unable to keep an appointment. What may interact with this medication? Do not take this medication with any of the following: Deferoxamine Dimercaprol Other iron products This medication may also interact with the following: Chloramphenicol Deferasirox This list may not describe all possible interactions. Give your health care provider a list of all the medicines, herbs, non-prescription drugs, or dietary supplements you use. Also tell them if you smoke,  drink alcohol, or use illegal drugs. Some items may interact with your medicine. What should I watch for while using this medication? Visit your care team regularly. Tell your care team if your symptoms do not start to get better or if they get worse. You may need blood work done while you are taking this medication. You may need to follow a special diet. Talk to your care team. Foods that contain iron include: whole grains/cereals, dried fruits, beans, or peas, leafy green vegetables, and organ meats (liver, kidney). What side effects may I notice from receiving this medication? Side effects that you should report to your care team as soon as possible: Allergic reactions--skin rash, itching, hives, swelling of the face, lips, tongue, or throat Low blood pressure--dizziness, feeling faint or lightheaded, blurry vision Shortness of breath Side effects that usually do not require medical attention (report to your care team if they continue or are bothersome): Flushing Headache Joint pain Muscle pain Nausea Pain, redness, or irritation at injection site This list may not describe all possible side effects. Call your doctor for medical advice about side effects. You may report side effects to FDA at 1-800-FDA-1088. Where should I keep my medication? This medication is given in a hospital or clinic. It will not be stored at home. NOTE: This sheet is a summary. It may not cover all possible information. If you have questions about this medicine, talk to your doctor, pharmacist, or health care provider.  2024 Elsevier/Gold Standard (2022-10-27 00:00:00)

## 2023-10-26 ENCOUNTER — Inpatient Hospital Stay

## 2023-11-03 DIAGNOSIS — F4329 Adjustment disorder with other symptoms: Secondary | ICD-10-CM | POA: Diagnosis not present

## 2023-11-03 DIAGNOSIS — J449 Chronic obstructive pulmonary disease, unspecified: Secondary | ICD-10-CM | POA: Diagnosis not present

## 2023-11-03 DIAGNOSIS — D500 Iron deficiency anemia secondary to blood loss (chronic): Secondary | ICD-10-CM | POA: Diagnosis not present

## 2023-11-05 DIAGNOSIS — F4320 Adjustment disorder, unspecified: Secondary | ICD-10-CM | POA: Insufficient documentation

## 2023-11-07 ENCOUNTER — Other Ambulatory Visit: Payer: Self-pay | Admitting: *Deleted

## 2023-11-07 DIAGNOSIS — D509 Iron deficiency anemia, unspecified: Secondary | ICD-10-CM

## 2023-11-07 DIAGNOSIS — D649 Anemia, unspecified: Secondary | ICD-10-CM

## 2023-11-08 ENCOUNTER — Inpatient Hospital Stay: Attending: Oncology

## 2023-11-08 ENCOUNTER — Inpatient Hospital Stay (HOSPITAL_BASED_OUTPATIENT_CLINIC_OR_DEPARTMENT_OTHER): Admitting: Oncology

## 2023-11-08 ENCOUNTER — Encounter: Payer: Self-pay | Admitting: Oncology

## 2023-11-08 ENCOUNTER — Inpatient Hospital Stay

## 2023-11-08 VITALS — BP 133/83 | HR 85 | Temp 98.6°F | Resp 20 | Wt 148.5 lb

## 2023-11-08 VITALS — BP 131/83 | HR 82

## 2023-11-08 DIAGNOSIS — J449 Chronic obstructive pulmonary disease, unspecified: Secondary | ICD-10-CM | POA: Diagnosis not present

## 2023-11-08 DIAGNOSIS — G2581 Restless legs syndrome: Secondary | ICD-10-CM | POA: Diagnosis not present

## 2023-11-08 DIAGNOSIS — Z79899 Other long term (current) drug therapy: Secondary | ICD-10-CM | POA: Insufficient documentation

## 2023-11-08 DIAGNOSIS — D509 Iron deficiency anemia, unspecified: Secondary | ICD-10-CM | POA: Diagnosis not present

## 2023-11-08 DIAGNOSIS — Q279 Congenital malformation of peripheral vascular system, unspecified: Secondary | ICD-10-CM | POA: Insufficient documentation

## 2023-11-08 DIAGNOSIS — F17210 Nicotine dependence, cigarettes, uncomplicated: Secondary | ICD-10-CM | POA: Diagnosis not present

## 2023-11-08 DIAGNOSIS — D649 Anemia, unspecified: Secondary | ICD-10-CM

## 2023-11-08 DIAGNOSIS — I10 Essential (primary) hypertension: Secondary | ICD-10-CM | POA: Diagnosis not present

## 2023-11-08 LAB — CBC WITH DIFFERENTIAL (CANCER CENTER ONLY)
Abs Immature Granulocytes: 0.01 10*3/uL (ref 0.00–0.07)
Basophils Absolute: 0 10*3/uL (ref 0.0–0.1)
Basophils Relative: 1 %
Eosinophils Absolute: 0.1 10*3/uL (ref 0.0–0.5)
Eosinophils Relative: 2 %
HCT: 29 % — ABNORMAL LOW (ref 36.0–46.0)
Hemoglobin: 8.8 g/dL — ABNORMAL LOW (ref 12.0–15.0)
Immature Granulocytes: 0 %
Lymphocytes Relative: 28 %
Lymphs Abs: 1 10*3/uL (ref 0.7–4.0)
MCH: 25 pg — ABNORMAL LOW (ref 26.0–34.0)
MCHC: 30.3 g/dL (ref 30.0–36.0)
MCV: 82.4 fL (ref 80.0–100.0)
Monocytes Absolute: 0.4 10*3/uL (ref 0.1–1.0)
Monocytes Relative: 11 %
Neutro Abs: 2.2 10*3/uL (ref 1.7–7.7)
Neutrophils Relative %: 58 %
Platelet Count: 292 10*3/uL (ref 150–400)
RBC: 3.52 MIL/uL — ABNORMAL LOW (ref 3.87–5.11)
RDW: 19.7 % — ABNORMAL HIGH (ref 11.5–15.5)
WBC Count: 3.7 10*3/uL — ABNORMAL LOW (ref 4.0–10.5)
nRBC: 0 % (ref 0.0–0.2)

## 2023-11-08 LAB — SAMPLE TO BLOOD BANK

## 2023-11-08 MED ORDER — IRON SUCROSE 20 MG/ML IV SOLN
200.0000 mg | Freq: Once | INTRAVENOUS | Status: AC
  Administered 2023-11-08: 200 mg via INTRAVENOUS
  Filled 2023-11-08: qty 10

## 2023-11-08 MED ORDER — OCTREOTIDE ACETATE 30 MG IM KIT
30.0000 mg | PACK | Freq: Once | INTRAMUSCULAR | Status: AC
  Administered 2023-11-08: 30 mg via INTRAMUSCULAR
  Filled 2023-11-08: qty 1

## 2023-11-08 NOTE — Progress Notes (Signed)
 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  INTERVAL HISTORY: Patient returns to clinic today for repeat laboratory work, further evaluation, and continuation of Venofer  and octreotide .  She continues to feel well and remains asymptomatic.  She does not complain of any weakness or fatigue today.  She denies any recent fevers or illnesses.  She has a fair appetite.  She denies any chest pain, shortness of breath, cough, or hemoptysis.  She denies any nausea, vomiting, constipation, or diarrhea.  She has noticed no melena or hematochezia.  She has no urinary complaints.  Patient offers no specific complaints today.  REVIEW OF SYSTEMS:   Review of Systems  Constitutional: Negative.  Negative for fever, malaise/fatigue and weight loss.  Respiratory: Negative.  Negative for cough and shortness of breath.   Cardiovascular: Negative.  Negative for chest pain and leg swelling.  Gastrointestinal: Negative.  Negative for abdominal pain, blood in stool and melena.  Genitourinary: Negative.  Negative for hematuria.  Musculoskeletal: Negative.  Negative for back pain.  Skin: Negative.  Negative for rash.  Neurological: Negative.  Negative for dizziness, focal weakness, weakness and headaches.  Psychiatric/Behavioral: Negative.  The patient is not nervous/anxious.     As per HPI. Otherwise, a complete review of systems is negative.  PAST MEDICAL HISTORY: Past Medical History:  Diagnosis Date   Anemia    Arthritis    Breast cancer (HCC) 1990   Right   COPD (chronic obstructive pulmonary disease) (HCC)    Dyspnea    DOE   Elevated lipids    Hypertension    Lower extremity edema     Migraines    Migraines    Personal history of radiation therapy 1990   Breast   Restless leg syndrome    Rotator cuff injury     PAST SURGICAL HISTORY: Past Surgical History:  Procedure Laterality Date   ABDOMINAL HYSTERECTOMY     BACK SURGERY  08/2017   CERVICAL FUSION   BREAST BIOPSY Right 1990   LUMPECTOMY.  took lymph nodes   BREAST LUMPECTOMY     COLONOSCOPY WITH PROPOFOL  N/A 07/20/2017   Procedure: COLONOSCOPY WITH PROPOFOL ;  Surgeon: Irby Mannan, MD;  Location: [facility] ENDOSCOPY;  Service: Endoscopy;  Laterality: N/A;   COLONOSCOPY WITH PROPOFOL  N/A 01/27/2022   Procedure: COLONOSCOPY WITH PROPOFOL ;  Surgeon: Luke Salaam, MD;  Location: [facility] ENDOSCOPY;  Service: Gastroenterology;  Laterality: N/A;   ENTEROSCOPY N/A 08/08/2017   Procedure: ENTEROSCOPY;  Surgeon: Selena Daily, MD;  Location: [facility] ENDOSCOPY;  Service: Gastroenterology;  Laterality: N/A;   ENTEROSCOPY N/A 05/07/2020   Procedure: ENTEROSCOPY with Adult colonoscope;  Surgeon: Irby Mannan, MD;  Location: [facility] ENDOSCOPY;  Service: Endoscopy;  Laterality: N/A;   ENTEROSCOPY N/A 03/17/2021   Procedure: ENTEROSCOPY;  Surgeon: Irby Mannan, MD;  Location: [facility] ENDOSCOPY;  Service: Endoscopy;  Laterality: N/A;  PUSH   ENTEROSCOPY N/A 07/13/2021   Procedure: ENTEROSCOPY;  Surgeon: Selena Daily, MD;  Location: [facility] ENDOSCOPY;  Service: Gastroenterology;  Laterality: N/A;   ENTEROSCOPY N/A 10/26/2021   Procedure: ENTEROSCOPY;  Surgeon: Selena Daily, MD;  Location: [facility] ENDOSCOPY;  Service: Gastroenterology;  Laterality: N/A;  push   ENTEROSCOPY N/A 07/27/2022   Procedure: ENTEROSCOPY;  Surgeon: Marnee Sink, MD;  Location: [facility] ENDOSCOPY;  Service: Endoscopy;  Laterality: N/A;  Push   ENTEROSCOPY N/A 06/29/2023   Procedure: ENTEROSCOPY;  Surgeon: Luke Salaam, MD;  Location: [facility] ENDOSCOPY;  Service: Gastroenterology;  Laterality: N/A;  Push   ENTEROSCOPY N/A 08/10/2023   Procedure:  ENTEROSCOPY;  Surgeon: Selena Daily, MD;  Location: [facility] ENDOSCOPY;  Service: Gastroenterology;  Laterality: N/A;  Push   ESOPHAGOGASTRODUODENOSCOPY Left 04/13/2017   Procedure: ESOPHAGOGASTRODUODENOSCOPY (EGD);  Surgeon: Irby Mannan, MD;  Location: [facility] ENDOSCOPY;  Service: Endoscopy;  Laterality: Left;   ESOPHAGOGASTRODUODENOSCOPY (EGD) WITH PROPOFOL  N/A 07/20/2017   Procedure: ESOPHAGOGASTRODUODENOSCOPY (EGD) WITH PROPOFOL ;  Surgeon: Irby Mannan, MD;  Location: [facility] ENDOSCOPY;  Service: Endoscopy;  Laterality: N/A;   ESOPHAGOGASTRODUODENOSCOPY (EGD) WITH PROPOFOL  N/A 12/12/2017   Procedure: ESOPHAGOGASTRODUODENOSCOPY (EGD) WITH PROPOFOL ;  Surgeon: Irby Mannan, MD;  Location: [facility] ENDOSCOPY;  Service: Endoscopy;  Laterality: N/A;   ESOPHAGOGASTRODUODENOSCOPY (EGD) WITH PROPOFOL  N/A 12/23/2020   Procedure: ESOPHAGOGASTRODUODENOSCOPY (EGD) WITH PROPOFOL ;  Surgeon: Irby Mannan, MD;  Location: [facility] ENDOSCOPY;  Service: Endoscopy;  Laterality: N/A;   ESOPHAGOGASTRODUODENOSCOPY (EGD) WITH PROPOFOL  N/A 01/27/2022   Procedure: ESOPHAGOGASTRODUODENOSCOPY (EGD) WITH PROPOFOL ;  Surgeon: Luke Salaam, MD;  Location: [facility] ENDOSCOPY;  Service: Gastroenterology;  Laterality: N/A;   GIVENS CAPSULE STUDY N/A 03/10/2021   Procedure: GIVENS CAPSULE STUDY;  Surgeon: Irby Mannan, MD;  Location: [facility] ENDOSCOPY;  Service: Endoscopy;  Laterality: N/A;   HOT HEMOSTASIS  06/29/2023   Procedure: HOT HEMOSTASIS (ARGON PLASMA COAGULATION/BICAP);  Surgeon: Luke Salaam, MD;  Location: [facility] ENDOSCOPY;  Service: Gastroenterology;;   HOT HEMOSTASIS  08/10/2023   Procedure: EGD, WITH ARGON PLASMA COAGULATION;  Surgeon: Selena Daily, MD;  Location: [facility] ENDOSCOPY;  Service: Gastroenterology;;   MEDIAL PARTIAL KNEE REPLACEMENT Left 1990   torn ligament. no knee replacement at that time   PARTIAL HYSTERECTOMY     SHOULDER ARTHROSCOPY WITH OPEN ROTATOR CUFF REPAIR Left 03/01/2016    Procedure: SHOULDER ARTHROSCOPY WITH OPEN ROTATOR CUFF REPAIR;  Surgeon: Rande Bushy, MD;  Location: [facility] ORS;  Service: Orthopedics;  Laterality: Left;   TOTAL KNEE ARTHROPLASTY Left 02/14/2018   Procedure: TOTAL KNEE ARTHROPLASTY;  Surgeon: Rande Bushy, MD;  Location: [facility] ORS;  Service: Orthopedics;  Laterality: Left;    FAMILY HISTORY: Family History  Problem Relation Age of Onset   CAD Mother    CAD Sister    Breast cancer Neg Hx     ADVANCED DIRECTIVES (Y/N):  N  HEALTH MAINTENANCE: Social History   Tobacco Use   Smoking status: Some Days    Current packs/day: 0.00    Average packs/day: 0.3 packs/day for 50.0 years (12.5 ttl pk-yrs)    Types: Cigarettes    Start date: 03/13/1966    Last attempt to quit: 03/13/2016    Years since quitting: 7.6   Smokeless tobacco: Never  Vaping Use   Vaping status: Never Used  Substance Use Topics   Alcohol use: No   Drug use: No     Colonoscopy:  PAP:  Bone density:  Lipid panel:  Allergies  Allergen Reactions   Latex Rash    Welts over body    Current Outpatient Medications  Medication Sig Dispense Refill   albuterol  (VENTOLIN  HFA) 108 (90 Base) MCG/ACT inhaler Inhale 2 puffs into the lungs every 6 (six) hours as needed for wheezing.     amitriptyline  (ELAVIL ) 25 MG tablet Take 25 mg by mouth at bedtime.     budesonide-formoterol  (  SYMBICORT) 160-4.5 MCG/ACT inhaler Inhale 2 puffs into the lungs 2 (two) times daily.     furosemide  (LASIX ) 20 MG tablet Take 20 mg daily by mouth.      lisinopril  (PRINIVIL ,ZESTRIL ) 20 MG tablet Take 20 mg by mouth daily.     potassium chloride  (KLOR-CON  M) 10 MEQ tablet Take 10 mEq by mouth daily.     rOPINIRole  (REQUIP ) 2 MG tablet Take 2-4 mg by mouth at bedtime as needed (restless leg syndrome).     simvastatin  (ZOCOR ) 20 MG tablet Take 20 mg by mouth at bedtime.     SPIRIVA  HANDIHALER 18 MCG inhalation capsule Place 18 mcg into inhaler and inhale daily.     SUMAtriptan  6  MG/0.5ML SOAJ Inject into the skin.     vitamin B-12 (CYANOCOBALAMIN ) 1000 MCG tablet Take 1,000 mcg by mouth daily.     omeprazole  (PRILOSEC ) 40 MG capsule Take 1 capsule (40 mg total) by mouth daily. (Patient not taking: Reported on 08/09/2023) 90 capsule 3   No current facility-administered medications for this visit.    OBJECTIVE: Vitals:   11/08/23 0911  BP: 133/83  Pulse: 85  Resp: 20  Temp: 98.6 F (37 C)  SpO2: 100%      Body mass index is 26.31 kg/m.    ECOG FS:0 - Asymptomatic  General: Well-developed, well-nourished, no acute distress. Eyes: Pink conjunctiva, anicteric sclera. HEENT: Normocephalic, moist mucous membranes. Lungs: No audible wheezing or coughing. Heart: Regular rate and rhythm. Abdomen: Soft, nontender, no obvious distention. Musculoskeletal: No edema, cyanosis, or clubbing. Neuro: Alert, answering all questions appropriately. Cranial nerves grossly intact. Skin: No rashes or petechiae noted. Psych: Normal affect.  LAB RESULTS:  Lab Results  Component Value Date   NA 144 08/10/2023   K 3.7 08/10/2023   CL 109 08/10/2023   CO2 29 08/10/2023   GLUCOSE 104 (H) 08/10/2023   BUN 17 08/10/2023   CREATININE 0.68 08/10/2023   CALCIUM 8.2 (L) 08/10/2023   PROT 5.7 (L) 06/13/2023   ALBUMIN 3.3 (L) 06/13/2023   AST 17 06/13/2023   ALT 13 06/13/2023   ALKPHOS 28 (L) 06/13/2023   BILITOT 0.4 06/13/2023   GFRNONAA >60 08/10/2023   GFRAA >60 02/16/2018    Lab Results  Component Value Date   WBC 3.7 (L) 11/08/2023   NEUTROABS 2.2 11/08/2023   HGB 8.8 (L) 11/08/2023   HCT 29.0 (L) 11/08/2023   MCV 82.4 11/08/2023   PLT 292 11/08/2023   Lab Results  Component Value Date   IRON  41 10/11/2023   TIBC 412 10/11/2023   IRONPCTSAT 10 (L) 10/11/2023   Lab Results  Component Value Date   FERRITIN 28 10/11/2023     STUDIES: No results found.    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  Patient's hemoglobin is mildly improved from 2 weeks ago and 8.8.  She has not required transfusion tomorrow.  Proceed with octreotide  and Venofer  only today.  Recommendation from GI is to pursue octreotide  every 2 weeks as scheduled for approximately 4 months.  The every 2-week treatment of octreotide  was started on August 20, 2023.  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  The dose of thalidomide is 100  mg daily for a total of 4 months.  Unlikely to use thalidomide in this patient given its toxicity and unclear efficacy.  Return to clinic in 2 weeks for further evaluation and continuation of treatment at which point octreotide  can be transitioned back to monthly injections. Leukopenia: Mild, monitor. History of AVMs: EGD and octreotide  as above.  Continue follow-up with GI as scheduled.   Tendon repair: Improved. Coping/adjustment: Patient was previously given a referral to [facility].  I spent a total of 30 minutes reviewing chart data, face-to-face evaluation with the patient, counseling and coordination of care as detailed above.   Patient expressed understanding and was in agreement with this plan. She also understands that She can call clinic at any time with any questions, concerns, or complaints.    Shellie Dials, MD   11/08/2023 9:21 AM

## 2023-11-08 NOTE — Patient Instructions (Signed)

## 2023-11-08 NOTE — Progress Notes (Signed)
Patient tolerated Venofer infusion well. Explained recommendation of 30 min post monitoring. Patient refused to wait post monitoring. Educated on what signs to watch for & to call with any concerns. No questions, discharged. Stable  

## 2023-11-09 ENCOUNTER — Inpatient Hospital Stay

## 2023-11-21 ENCOUNTER — Other Ambulatory Visit: Payer: Self-pay | Admitting: *Deleted

## 2023-11-21 DIAGNOSIS — D509 Iron deficiency anemia, unspecified: Secondary | ICD-10-CM

## 2023-11-22 ENCOUNTER — Inpatient Hospital Stay (HOSPITAL_BASED_OUTPATIENT_CLINIC_OR_DEPARTMENT_OTHER): Admitting: Oncology

## 2023-11-22 ENCOUNTER — Inpatient Hospital Stay

## 2023-11-22 ENCOUNTER — Encounter: Payer: Self-pay | Admitting: Oncology

## 2023-11-22 VITALS — BP 150/86 | HR 93 | Temp 97.1°F | Resp 16 | Wt 147.0 lb

## 2023-11-22 DIAGNOSIS — D509 Iron deficiency anemia, unspecified: Secondary | ICD-10-CM

## 2023-11-22 LAB — CBC WITH DIFFERENTIAL/PLATELET
Abs Immature Granulocytes: 0.01 10*3/uL (ref 0.00–0.07)
Basophils Absolute: 0 10*3/uL (ref 0.0–0.1)
Basophils Relative: 1 %
Eosinophils Absolute: 0 10*3/uL (ref 0.0–0.5)
Eosinophils Relative: 1 %
HCT: 24.3 % — ABNORMAL LOW (ref 36.0–46.0)
Hemoglobin: 7.4 g/dL — ABNORMAL LOW (ref 12.0–15.0)
Immature Granulocytes: 0 %
Lymphocytes Relative: 27 %
Lymphs Abs: 1.1 10*3/uL (ref 0.7–4.0)
MCH: 25.8 pg — ABNORMAL LOW (ref 26.0–34.0)
MCHC: 30.5 g/dL (ref 30.0–36.0)
MCV: 84.7 fL (ref 80.0–100.0)
Monocytes Absolute: 0.3 10*3/uL (ref 0.1–1.0)
Monocytes Relative: 7 %
Neutro Abs: 2.7 10*3/uL (ref 1.7–7.7)
Neutrophils Relative %: 64 %
Platelets: 257 10*3/uL (ref 150–400)
RBC: 2.87 MIL/uL — ABNORMAL LOW (ref 3.87–5.11)
RDW: 19.1 % — ABNORMAL HIGH (ref 11.5–15.5)
WBC: 4.2 10*3/uL (ref 4.0–10.5)
nRBC: 0 % (ref 0.0–0.2)

## 2023-11-22 LAB — IRON AND TIBC
Iron: 39 ug/dL (ref 28–170)
Saturation Ratios: 11 % (ref 10.4–31.8)
TIBC: 357 ug/dL (ref 250–450)
UIBC: 318 ug/dL

## 2023-11-22 LAB — FERRITIN: Ferritin: 30 ng/mL (ref 11–307)

## 2023-11-22 LAB — PREPARE RBC (CROSSMATCH)

## 2023-11-22 MED ORDER — OCTREOTIDE ACETATE 30 MG IM KIT
30.0000 mg | PACK | Freq: Once | INTRAMUSCULAR | Status: AC
  Administered 2023-11-22: 30 mg via INTRAMUSCULAR
  Filled 2023-11-22: qty 1

## 2023-11-22 NOTE — Progress Notes (Signed)
 [facility]  Telephone:(336) [phone] Fax:(336) [phone]  ID: Hailey Farrell OB: [birth year]  MR#: [id number]  QIO#:[id number]  Patient Care Team: Dionicia Frater, MD as PCP - General (Family Medicine) Shellie Dials, MD as Consulting Physician (Oncology)  CHIEF COMPLAINT: Iron  deficiency anemia secondary to AVM/GI bleed.  INTERVAL HISTORY: Patient returns to clinic today for repeat laboratory work, further evaluation, and consideration of IV Venofer  and octreotide .  Patient reports she has become more fatigued and has noticed some dark tarry stools.  She otherwise feels well. She denies any recent fevers or illnesses.  She has a fair appetite.  She denies any chest pain, shortness of breath, cough, or hemoptysis.  She denies any nausea, vomiting, constipation, or diarrhea.  She had has no hematochezia.  She has no urinary complaints.  Patient offers no further specific complaints today.  REVIEW OF SYSTEMS:   Review of Systems  Constitutional:  Positive for malaise/fatigue. Negative for fever and weight loss.  Respiratory: Negative.  Negative for cough and shortness of breath.   Cardiovascular: Negative.  Negative for chest pain and leg swelling.  Gastrointestinal:  Positive for melena. Negative for abdominal pain and blood in stool.  Genitourinary: Negative.  Negative for hematuria.  Musculoskeletal: Negative.  Negative for back pain.  Skin: Negative.  Negative for rash.  Neurological: Negative.  Negative for dizziness, focal weakness, weakness and headaches.  Psychiatric/Behavioral: Negative.  The patient is not nervous/anxious.     As per HPI. Otherwise, a complete review of systems is negative.  PAST MEDICAL HISTORY: Past Medical History:  Diagnosis Date   Anemia    Arthritis    Breast cancer (HCC) 1990   Right   COPD (chronic obstructive pulmonary disease) (HCC)    Dyspnea    DOE   Elevated lipids    Hypertension    Lower extremity edema     Migraines    Migraines    Personal history of radiation therapy 1990   Breast   Restless leg syndrome    Rotator cuff injury     PAST SURGICAL HISTORY: Past Surgical History:  Procedure Laterality Date   ABDOMINAL HYSTERECTOMY     BACK SURGERY  08/2017   CERVICAL FUSION   BREAST BIOPSY Right 1990   LUMPECTOMY.  took lymph nodes   BREAST LUMPECTOMY     COLONOSCOPY WITH PROPOFOL  N/A 07/20/2017   Procedure: COLONOSCOPY WITH PROPOFOL ;  Surgeon: Irby Mannan, MD;  Location: [facility] ENDOSCOPY;  Service: Endoscopy;  Laterality: N/A;   COLONOSCOPY WITH PROPOFOL  N/A 01/27/2022   Procedure: COLONOSCOPY WITH PROPOFOL ;  Surgeon: Luke Salaam, MD;  Location: [facility] ENDOSCOPY;  Service: Gastroenterology;  Laterality: N/A;   ENTEROSCOPY N/A 08/08/2017   Procedure: ENTEROSCOPY;  Surgeon: Selena Daily, MD;  Location: [facility] ENDOSCOPY;  Service: Gastroenterology;  Laterality: N/A;   ENTEROSCOPY N/A 05/07/2020   Procedure: ENTEROSCOPY with Adult colonoscope;  Surgeon: Irby Mannan, MD;  Location: [facility] ENDOSCOPY;  Service: Endoscopy;  Laterality: N/A;   ENTEROSCOPY N/A 03/17/2021   Procedure: ENTEROSCOPY;  Surgeon: Irby Mannan, MD;  Location: [facility] ENDOSCOPY;  Service: Endoscopy;  Laterality: N/A;  PUSH   ENTEROSCOPY N/A 07/13/2021   Procedure: ENTEROSCOPY;  Surgeon: Selena Daily, MD;  Location: [facility] ENDOSCOPY;  Service: Gastroenterology;  Laterality: N/A;   ENTEROSCOPY N/A 10/26/2021   Procedure: ENTEROSCOPY;  Surgeon: Selena Daily, MD;  Location: [facility] ENDOSCOPY;  Service: Gastroenterology;  Laterality: N/A;  push   ENTEROSCOPY N/A 07/27/2022   Procedure: ENTEROSCOPY;  Surgeon: Marnee Sink, MD;  Location: [facility] ENDOSCOPY;  Service: Endoscopy;  Laterality: N/A;  Push   ENTEROSCOPY N/A 06/29/2023   Procedure: ENTEROSCOPY;  Surgeon: Luke Salaam, MD;  Location: [facility] ENDOSCOPY;  Service: Gastroenterology;  Laterality: N/A;  Push   ENTEROSCOPY N/A 08/10/2023   Procedure:  ENTEROSCOPY;  Surgeon: Selena Daily, MD;  Location: [facility] ENDOSCOPY;  Service: Gastroenterology;  Laterality: N/A;  Push   ESOPHAGOGASTRODUODENOSCOPY Left 04/13/2017   Procedure: ESOPHAGOGASTRODUODENOSCOPY (EGD);  Surgeon: Irby Mannan, MD;  Location: [facility] ENDOSCOPY;  Service: Endoscopy;  Laterality: Left;   ESOPHAGOGASTRODUODENOSCOPY (EGD) WITH PROPOFOL  N/A 07/20/2017   Procedure: ESOPHAGOGASTRODUODENOSCOPY (EGD) WITH PROPOFOL ;  Surgeon: Irby Mannan, MD;  Location: [facility] ENDOSCOPY;  Service: Endoscopy;  Laterality: N/A;   ESOPHAGOGASTRODUODENOSCOPY (EGD) WITH PROPOFOL  N/A 12/12/2017   Procedure: ESOPHAGOGASTRODUODENOSCOPY (EGD) WITH PROPOFOL ;  Surgeon: Irby Mannan, MD;  Location: [facility] ENDOSCOPY;  Service: Endoscopy;  Laterality: N/A;   ESOPHAGOGASTRODUODENOSCOPY (EGD) WITH PROPOFOL  N/A 12/23/2020   Procedure: ESOPHAGOGASTRODUODENOSCOPY (EGD) WITH PROPOFOL ;  Surgeon: Irby Mannan, MD;  Location: [facility] ENDOSCOPY;  Service: Endoscopy;  Laterality: N/A;   ESOPHAGOGASTRODUODENOSCOPY (EGD) WITH PROPOFOL  N/A 01/27/2022   Procedure: ESOPHAGOGASTRODUODENOSCOPY (EGD) WITH PROPOFOL ;  Surgeon: Luke Salaam, MD;  Location: [facility] ENDOSCOPY;  Service: Gastroenterology;  Laterality: N/A;   GIVENS CAPSULE STUDY N/A 03/10/2021   Procedure: GIVENS CAPSULE STUDY;  Surgeon: Irby Mannan, MD;  Location: [facility] ENDOSCOPY;  Service: Endoscopy;  Laterality: N/A;   HOT HEMOSTASIS  06/29/2023   Procedure: HOT HEMOSTASIS (ARGON PLASMA COAGULATION/BICAP);  Surgeon: Luke Salaam, MD;  Location: [facility] ENDOSCOPY;  Service: Gastroenterology;;   HOT HEMOSTASIS  08/10/2023   Procedure: EGD, WITH ARGON PLASMA COAGULATION;  Surgeon: Selena Daily, MD;  Location: [facility] ENDOSCOPY;  Service: Gastroenterology;;   MEDIAL PARTIAL KNEE REPLACEMENT Left 1990   torn ligament. no knee replacement at that time   PARTIAL HYSTERECTOMY     SHOULDER ARTHROSCOPY WITH OPEN ROTATOR CUFF REPAIR Left 03/01/2016    Procedure: SHOULDER ARTHROSCOPY WITH OPEN ROTATOR CUFF REPAIR;  Surgeon: Rande Bushy, MD;  Location: [facility] ORS;  Service: Orthopedics;  Laterality: Left;   TOTAL KNEE ARTHROPLASTY Left 02/14/2018   Procedure: TOTAL KNEE ARTHROPLASTY;  Surgeon: Rande Bushy, MD;  Location: [facility] ORS;  Service: Orthopedics;  Laterality: Left;    FAMILY HISTORY: Family History  Problem Relation Age of Onset   CAD Mother    CAD Sister    Breast cancer Neg Hx     ADVANCED DIRECTIVES (Y/N):  N  HEALTH MAINTENANCE: Social History   Tobacco Use   Smoking status: Some Days    Current packs/day: 0.00    Average packs/day: 0.3 packs/day for 50.0 years (12.5 ttl pk-yrs)    Types: Cigarettes    Start date: 03/13/1966    Last attempt to quit: 03/13/2016    Years since quitting: 7.6   Smokeless tobacco: Never  Vaping Use   Vaping status: Never Used  Substance Use Topics   Alcohol use: No   Drug use: No     Colonoscopy:  PAP:  Bone density:  Lipid panel:  Allergies  Allergen Reactions   Latex Rash    Welts over body    Current Outpatient Medications  Medication Sig Dispense Refill   albuterol  (VENTOLIN  HFA) 108 (90 Base) MCG/ACT inhaler Inhale 2 puffs into the lungs every 6 (six) hours as needed for wheezing.     amitriptyline  (ELAVIL ) 50 MG tablet Take 50 mg by mouth at bedtime.     budesonide-formoterol  (  SYMBICORT) 160-4.5 MCG/ACT inhaler Inhale 2 puffs into the lungs 2 (two) times daily.     escitalopram (LEXAPRO) 5 MG tablet Take 5 mg by mouth daily.     furosemide  (LASIX ) 20 MG tablet Take 20 mg daily by mouth.      lisinopril  (PRINIVIL ,ZESTRIL ) 20 MG tablet Take 20 mg by mouth daily.     omeprazole  (PRILOSEC ) 40 MG capsule Take 1 capsule (40 mg total) by mouth daily. 90 capsule 3   potassium chloride  (KLOR-CON  M) 10 MEQ tablet Take 10 mEq by mouth daily.     rOPINIRole  (REQUIP ) 2 MG tablet Take 2-4 mg by mouth at bedtime as needed (restless leg syndrome).     simvastatin  (ZOCOR )  20 MG tablet Take 20 mg by mouth at bedtime.     SPIRIVA  HANDIHALER 18 MCG inhalation capsule Place 18 mcg into inhaler and inhale daily.     SUMAtriptan  6 MG/0.5ML SOAJ Inject into the skin.     vitamin B-12 (CYANOCOBALAMIN ) 1000 MCG tablet Take 1,000 mcg by mouth daily.     amitriptyline  (ELAVIL ) 25 MG tablet Take 25 mg by mouth at bedtime. (Patient not taking: Reported on 11/22/2023)     No current facility-administered medications for this visit.    OBJECTIVE: Vitals:   11/22/23 1010  BP: (!) 150/86  Pulse: 93  Resp: 16  Temp: (!) 97.1 F (36.2 C)  SpO2: 100%      Body mass index is 26.04 kg/m.    ECOG FS:0 - Asymptomatic  General: Well-developed, well-nourished, no acute distress. Eyes: Pink conjunctiva, anicteric sclera. HEENT: Normocephalic, moist mucous membranes. Lungs: No audible wheezing or coughing. Heart: Regular rate and rhythm. Abdomen: Soft, nontender, no obvious distention. Musculoskeletal: No edema, cyanosis, or clubbing. Neuro: Alert, answering all questions appropriately. Cranial nerves grossly intact. Skin: No rashes or petechiae noted. Psych: Normal affect.  LAB RESULTS:  Lab Results  Component Value Date   NA 144 08/10/2023   K 3.7 08/10/2023   CL 109 08/10/2023   CO2 29 08/10/2023   GLUCOSE 104 (H) 08/10/2023   BUN 17 08/10/2023   CREATININE 0.68 08/10/2023   CALCIUM 8.2 (L) 08/10/2023   PROT 5.7 (L) 06/13/2023   ALBUMIN 3.3 (L) 06/13/2023   AST 17 06/13/2023   ALT 13 06/13/2023   ALKPHOS 28 (L) 06/13/2023   BILITOT 0.4 06/13/2023   GFRNONAA >60 08/10/2023   GFRAA >60 02/16/2018    Lab Results  Component Value Date   WBC 4.2 11/22/2023   NEUTROABS 2.7 11/22/2023   HGB 7.4 (L) 11/22/2023   HCT 24.3 (L) 11/22/2023   MCV 84.7 11/22/2023   PLT 257 11/22/2023   Lab Results  Component Value Date   IRON  39 11/22/2023   TIBC 357 11/22/2023   IRONPCTSAT 11 11/22/2023   Lab Results  Component Value Date   FERRITIN 30 11/22/2023      STUDIES: No results found.    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  Patient's hemoglobin has trended down to 7.4 and she is symptomatic.  She does not require Venofer  today, but will return to clinic tomorrow received 1 unit of packed red blood cells.  Proceed with octreotide  as scheduled.   Recommendation from GI is to pursue octreotide  every 2 weeks as scheduled for approximately  4 months.  The every 2-week treatment of octreotide  was started on August 20, 2023.  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  The dose of thalidomide is 100 mg daily for a total of 4 months.  Unlikely to use thalidomide in this patient given its toxicity and unclear efficacy.  Return to clinic tomorrow for 1 unit of blood and then in 2 weeks for further evaluation and continuation of treatment.  Will continue octreotide  every 2 weeks through July 2025 at which point we will switch her back to monthly injections.   Leukopenia: Resolved. History of AVMs: EGD and octreotide  as above.  Continue follow-up with GI as scheduled.   Tendon repair: Improved. Coping/adjustment: Patient was previously given a referral to [facility].  I spent a total of 30 minutes reviewing chart data, face-to-face evaluation with the patient, counseling and coordination of care as detailed above.    Patient expressed understanding and was in agreement with this plan. She also understands that She can call clinic at any time with any questions, concerns, or complaints.    Shellie Dials, MD   11/22/2023 3:48 PM

## 2023-11-23 ENCOUNTER — Inpatient Hospital Stay

## 2023-11-23 DIAGNOSIS — D509 Iron deficiency anemia, unspecified: Secondary | ICD-10-CM | POA: Diagnosis not present

## 2023-11-23 MED ORDER — DIPHENHYDRAMINE HCL 50 MG/ML IJ SOLN
25.0000 mg | Freq: Once | INTRAMUSCULAR | Status: AC
  Administered 2023-11-23: 25 mg via INTRAVENOUS
  Filled 2023-11-23: qty 1

## 2023-11-23 MED ORDER — SODIUM CHLORIDE 0.9% IV SOLUTION
250.0000 mL | INTRAVENOUS | Status: DC
  Administered 2023-11-23: 100 mL via INTRAVENOUS
  Filled 2023-11-23: qty 250

## 2023-11-23 MED ORDER — ACETAMINOPHEN 325 MG PO TABS
650.0000 mg | ORAL_TABLET | Freq: Once | ORAL | Status: AC
  Administered 2023-11-23: 650 mg via ORAL
  Filled 2023-11-23: qty 2

## 2023-11-24 LAB — TYPE AND SCREEN
ABO/RH(D): O POS
Antibody Screen: NEGATIVE
Unit division: 0

## 2023-11-24 LAB — BPAM RBC
Blood Product Expiration Date: 202507222359
ISSUE DATE / TIME: 202506200915
Unit Type and Rh: 5100

## 2023-12-04 ENCOUNTER — Other Ambulatory Visit: Payer: Self-pay | Admitting: *Deleted

## 2023-12-04 DIAGNOSIS — D509 Iron deficiency anemia, unspecified: Secondary | ICD-10-CM

## 2023-12-05 ENCOUNTER — Inpatient Hospital Stay

## 2023-12-05 ENCOUNTER — Inpatient Hospital Stay: Attending: Oncology

## 2023-12-05 ENCOUNTER — Inpatient Hospital Stay (HOSPITAL_BASED_OUTPATIENT_CLINIC_OR_DEPARTMENT_OTHER): Admitting: Oncology

## 2023-12-05 ENCOUNTER — Encounter: Payer: Self-pay | Admitting: Oncology

## 2023-12-05 VITALS — BP 133/69 | HR 72 | Temp 97.1°F | Resp 16 | Ht 63.0 in | Wt 151.0 lb

## 2023-12-05 DIAGNOSIS — D509 Iron deficiency anemia, unspecified: Secondary | ICD-10-CM | POA: Diagnosis not present

## 2023-12-05 DIAGNOSIS — Z79899 Other long term (current) drug therapy: Secondary | ICD-10-CM | POA: Diagnosis not present

## 2023-12-05 DIAGNOSIS — K922 Gastrointestinal hemorrhage, unspecified: Secondary | ICD-10-CM | POA: Diagnosis not present

## 2023-12-05 LAB — CBC WITH DIFFERENTIAL/PLATELET
Abs Immature Granulocytes: 0.01 10*3/uL (ref 0.00–0.07)
Basophils Absolute: 0 10*3/uL (ref 0.0–0.1)
Basophils Relative: 1 %
Eosinophils Absolute: 0 10*3/uL (ref 0.0–0.5)
Eosinophils Relative: 1 %
HCT: 27.3 % — ABNORMAL LOW (ref 36.0–46.0)
Hemoglobin: 8.4 g/dL — ABNORMAL LOW (ref 12.0–15.0)
Immature Granulocytes: 0 %
Lymphocytes Relative: 23 %
Lymphs Abs: 0.8 10*3/uL (ref 0.7–4.0)
MCH: 25.4 pg — ABNORMAL LOW (ref 26.0–34.0)
MCHC: 30.8 g/dL (ref 30.0–36.0)
MCV: 82.5 fL (ref 80.0–100.0)
Monocytes Absolute: 0.3 10*3/uL (ref 0.1–1.0)
Monocytes Relative: 8 %
Neutro Abs: 2.4 10*3/uL (ref 1.7–7.7)
Neutrophils Relative %: 67 %
Platelets: 265 10*3/uL (ref 150–400)
RBC: 3.31 MIL/uL — ABNORMAL LOW (ref 3.87–5.11)
RDW: 17.4 % — ABNORMAL HIGH (ref 11.5–15.5)
WBC: 3.6 10*3/uL — ABNORMAL LOW (ref 4.0–10.5)
nRBC: 0 % (ref 0.0–0.2)

## 2023-12-05 LAB — SAMPLE TO BLOOD BANK

## 2023-12-05 MED ORDER — OCTREOTIDE ACETATE 30 MG IM KIT
30.0000 mg | PACK | Freq: Once | INTRAMUSCULAR | Status: AC
  Administered 2023-12-05: 30 mg via INTRAMUSCULAR
  Filled 2023-12-05: qty 1

## 2023-12-05 NOTE — Progress Notes (Signed)
 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  INTERVAL HISTORY: Patient returns to clinic today for repeat laboratory work, further evaluation, and consideration of octreotide  and possible blood transfusion.  Her weakness and fatigue have improved. She otherwise feels well. She denies any recent fevers or illnesses.  She has a fair appetite.  She denies any chest pain, shortness of breath, cough, or hemoptysis.  She denies any nausea, vomiting, constipation, or diarrhea.  She had has no hematochezia.  She has no urinary complaints.  Patient offers no further specific complaints today.  REVIEW OF SYSTEMS:   Review of Systems  Constitutional:  Positive for malaise/fatigue. Negative for fever and weight loss.  Respiratory: Negative.  Negative for cough and shortness of breath.   Cardiovascular: Negative.  Negative for chest pain and leg swelling.  Gastrointestinal:  Positive for melena. Negative for abdominal pain and blood in stool.  Genitourinary: Negative.  Negative for hematuria.  Musculoskeletal: Negative.  Negative for back pain.  Skin: Negative.  Negative for rash.  Neurological: Negative.  Negative for dizziness, focal weakness, weakness and headaches.  Psychiatric/Behavioral: Negative.  The patient is not nervous/anxious.     As per HPI. Otherwise, a complete review of systems is negative.  PAST MEDICAL HISTORY: Past Medical History:  Diagnosis Date   Anemia    Arthritis    Breast cancer (HCC) 1990   Right   COPD (chronic obstructive pulmonary disease) (HCC)    Dyspnea    DOE   Elevated lipids    Hypertension    Lower extremity edema    Migraines    Migraines     Personal history of radiation therapy 1990   Breast   Restless leg syndrome    Rotator cuff injury     PAST SURGICAL HISTORY: Past Surgical History:  Procedure Laterality Date   ABDOMINAL HYSTERECTOMY     BACK SURGERY  08/2017   CERVICAL FUSION   BREAST BIOPSY Right 1990   LUMPECTOMY.  took lymph nodes   BREAST LUMPECTOMY     COLONOSCOPY WITH PROPOFOL  N/A 07/20/2017   Procedure: COLONOSCOPY WITH PROPOFOL ;  Surgeon: Janalyn Keene NOVAK, MD;  Location: [facility] ENDOSCOPY;  Service: Endoscopy;  Laterality: N/A;   COLONOSCOPY WITH PROPOFOL  N/A 01/27/2022   Procedure: COLONOSCOPY WITH PROPOFOL ;  Surgeon: Therisa Bi, MD;  Location: [facility] ENDOSCOPY;  Service: Gastroenterology;  Laterality: N/A;   ENTEROSCOPY N/A 08/08/2017   Procedure: ENTEROSCOPY;  Surgeon: Unk Corinn Skiff, MD;  Location: [facility] ENDOSCOPY;  Service: Gastroenterology;  Laterality: N/A;   ENTEROSCOPY N/A 05/07/2020   Procedure: ENTEROSCOPY with Adult colonoscope;  Surgeon: Janalyn Keene NOVAK, MD;  Location: [facility] ENDOSCOPY;  Service: Endoscopy;  Laterality: N/A;   ENTEROSCOPY N/A 03/17/2021   Procedure: ENTEROSCOPY;  Surgeon: Janalyn Keene NOVAK, MD;  Location: [facility] ENDOSCOPY;  Service: Endoscopy;  Laterality: N/A;  PUSH   ENTEROSCOPY N/A 07/13/2021   Procedure: ENTEROSCOPY;  Surgeon: Unk Corinn Skiff, MD;  Location: [facility] ENDOSCOPY;  Service: Gastroenterology;  Laterality: N/A;   ENTEROSCOPY N/A 10/26/2021   Procedure: ENTEROSCOPY;  Surgeon: Unk Corinn Skiff, MD;  Location: [facility] ENDOSCOPY;  Service: Gastroenterology;  Laterality: N/A;  push   ENTEROSCOPY N/A 07/27/2022   Procedure: ENTEROSCOPY;  Surgeon: Jinny Carmine, MD;  Location: [facility]  ENDOSCOPY;  Service: Endoscopy;  Laterality: N/A;  Push   ENTEROSCOPY N/A 06/29/2023   Procedure: ENTEROSCOPY;  Surgeon: Therisa Bi, MD;  Location: [facility] ENDOSCOPY;  Service: Gastroenterology;  Laterality: N/A;  Push   ENTEROSCOPY N/A 08/10/2023   Procedure: ENTEROSCOPY;  Surgeon: Unk Corinn Skiff, MD;  Location: [facility] ENDOSCOPY;  Service: Gastroenterology;  Laterality: N/A;  Push   ESOPHAGOGASTRODUODENOSCOPY Left 04/13/2017   Procedure: ESOPHAGOGASTRODUODENOSCOPY (EGD);  Surgeon: Janalyn Keene NOVAK, MD;  Location: [facility] ENDOSCOPY;  Service: Endoscopy;  Laterality: Left;   ESOPHAGOGASTRODUODENOSCOPY (EGD) WITH PROPOFOL  N/A 07/20/2017   Procedure: ESOPHAGOGASTRODUODENOSCOPY (EGD) WITH PROPOFOL ;  Surgeon: Janalyn Keene NOVAK, MD;  Location: [facility] ENDOSCOPY;  Service: Endoscopy;  Laterality: N/A;   ESOPHAGOGASTRODUODENOSCOPY (EGD) WITH PROPOFOL  N/A 12/12/2017   Procedure: ESOPHAGOGASTRODUODENOSCOPY (EGD) WITH PROPOFOL ;  Surgeon: Janalyn Keene NOVAK, MD;  Location: [facility] ENDOSCOPY;  Service: Endoscopy;  Laterality: N/A;   ESOPHAGOGASTRODUODENOSCOPY (EGD) WITH PROPOFOL  N/A 12/23/2020   Procedure: ESOPHAGOGASTRODUODENOSCOPY (EGD) WITH PROPOFOL ;  Surgeon: Janalyn Keene NOVAK, MD;  Location: [facility] ENDOSCOPY;  Service: Endoscopy;  Laterality: N/A;   ESOPHAGOGASTRODUODENOSCOPY (EGD) WITH PROPOFOL  N/A 01/27/2022   Procedure: ESOPHAGOGASTRODUODENOSCOPY (EGD) WITH PROPOFOL ;  Surgeon: Therisa Bi, MD;  Location: [name] ENDOSCOPY;  Service: Gastroenterology;  Laterality: N/A;   GIVENS CAPSULE STUDY N/A 03/10/2021   Procedure: GIVENS CAPSULE STUDY;  Surgeon: Janalyn Keene NOVAK, MD;  Location: [facility] ENDOSCOPY;  Service: Endoscopy;  Laterality: N/A;   HOT HEMOSTASIS  06/29/2023   Procedure: HOT HEMOSTASIS (ARGON PLASMA COAGULATION/BICAP);  Surgeon: Therisa Bi, MD;  Location: [facility] ENDOSCOPY;  Service: Gastroenterology;;   HOT HEMOSTASIS  08/10/2023   Procedure: EGD, WITH ARGON PLASMA COAGULATION;  Surgeon: Unk Corinn Skiff, MD;  Location: [facility] ENDOSCOPY;  Service: Gastroenterology;;   MEDIAL PARTIAL KNEE REPLACEMENT Left 1990   torn ligament. no knee replacement at that time   PARTIAL HYSTERECTOMY     SHOULDER ARTHROSCOPY WITH OPEN ROTATOR CUFF REPAIR Left 03/01/2016   Procedure: SHOULDER  ARTHROSCOPY WITH OPEN ROTATOR CUFF REPAIR;  Surgeon: Franky Cranker, MD;  Location: [facility] ORS;  Service: Orthopedics;  Laterality: Left;   TOTAL KNEE ARTHROPLASTY Left 02/14/2018   Procedure: TOTAL KNEE ARTHROPLASTY;  Surgeon: Cranker Franky, MD;  Location: [facility] ORS;  Service: Orthopedics;  Laterality: Left;    FAMILY HISTORY: Family History  Problem Relation Age of Onset   CAD Mother    CAD Sister    Breast cancer Neg Hx     ADVANCED DIRECTIVES (Y/N):  N  HEALTH MAINTENANCE: Social History   Tobacco Use   Smoking status: Some Days    Current packs/day: 0.00    Average packs/day: 0.3 packs/day for 50.0 years (12.5 ttl pk-yrs)    Types: Cigarettes    Start date: 03/13/1966    Last attempt to quit: 03/13/2016    Years since quitting: 7.7   Smokeless tobacco: Never  Vaping Use   Vaping status: Never Used  Substance Use Topics   Alcohol use: No   Drug use: No     Colonoscopy:  PAP:  Bone density:  Lipid panel:  Allergies  Allergen Reactions   Latex Rash    Welts over body    Current Outpatient Medications  Medication Sig Dispense Refill   albuterol  (VENTOLIN  HFA) 108 (90 Base) MCG/ACT inhaler Inhale 2 puffs into the lungs every 6 (six) hours as needed for wheezing.     amitriptyline  (ELAVIL ) 50 MG tablet Take 50 mg by mouth at bedtime.     budesonide-formoterol  (SYMBICORT) 160-4.5 MCG/ACT inhaler Inhale 2 puffs  into the lungs 2 (two) times daily.     escitalopram (LEXAPRO) 5 MG tablet Take 5 mg by mouth daily.     furosemide  (LASIX ) 20 MG tablet Take 20 mg daily by mouth.      lisinopril  (PRINIVIL ,ZESTRIL ) 20 MG tablet Take 20 mg by mouth daily.     omeprazole  (PRILOSEC ) 40 MG capsule Take 1 capsule (40 mg total) by mouth daily. 90 capsule 3   potassium chloride  (KLOR-CON  M) 10 MEQ tablet Take 10 mEq by mouth daily.     rOPINIRole  (REQUIP ) 2 MG tablet Take 2-4 mg by mouth at bedtime as needed (restless leg syndrome).     simvastatin  (ZOCOR ) 20 MG tablet Take 20 mg  by mouth at bedtime.     SPIRIVA  HANDIHALER 18 MCG inhalation capsule Place 18 mcg into inhaler and inhale daily.     SUMAtriptan  6 MG/0.5ML SOAJ Inject into the skin.     vitamin B-12 (CYANOCOBALAMIN ) 1000 MCG tablet Take 1,000 mcg by mouth daily.     amitriptyline  (ELAVIL ) 25 MG tablet Take 25 mg by mouth at bedtime. (Patient not taking: Reported on 12/05/2023)     No current facility-administered medications for this visit.    OBJECTIVE: Vitals:   12/05/23 1042  BP: 133/69  Pulse: 72  Resp: 16  Temp: (!) 97.1 F (36.2 C)  SpO2: 100%      Body mass index is 26.75 kg/m.    ECOG FS:0 - Asymptomatic  General: Well-developed, well-nourished, no acute distress. Eyes: Pink conjunctiva, anicteric sclera. HEENT: Normocephalic, moist mucous membranes. Lungs: No audible wheezing or coughing. Heart: Regular rate and rhythm. Abdomen: Soft, nontender, no obvious distention. Musculoskeletal: No edema, cyanosis, or clubbing. Neuro: Alert, answering all questions appropriately. Cranial nerves grossly intact. Skin: No rashes or petechiae noted. Psych: Normal affect.  LAB RESULTS:  Lab Results  Component Value Date   NA 144 08/10/2023   K 3.7 08/10/2023   CL 109 08/10/2023   CO2 29 08/10/2023   GLUCOSE 104 (H) 08/10/2023   BUN 17 08/10/2023   CREATININE 0.68 08/10/2023   CALCIUM 8.2 (L) 08/10/2023   PROT 5.7 (L) 06/13/2023   ALBUMIN 3.3 (L) 06/13/2023   AST 17 06/13/2023   ALT 13 06/13/2023   ALKPHOS 28 (L) 06/13/2023   BILITOT 0.4 06/13/2023   GFRNONAA >60 08/10/2023   GFRAA >60 02/16/2018    Lab Results  Component Value Date   WBC 3.6 (L) 12/05/2023   NEUTROABS 2.4 12/05/2023   HGB 8.4 (L) 12/05/2023   HCT 27.3 (L) 12/05/2023   MCV 82.5 12/05/2023   PLT 265 12/05/2023   Lab Results  Component Value Date   IRON  39 11/22/2023   TIBC 357 11/22/2023   IRONPCTSAT 11 11/22/2023   Lab Results  Component Value Date   FERRITIN 30 11/22/2023     STUDIES: No results  found.    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  Patient's hemoglobin improved to 8.4 with transfusion 2 weeks ago.  She does not require any further Venofer  with normal iron  stores from November 22, 2023.  Proceed with octreotide  as scheduled.   Recommendation from GI is to pursue octreotide  every 2 weeks as scheduled for approximately 4 months.  The every 2-week treatment of octreotide  was started on August 20, 2023.  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  The dose of thalidomide is 100 mg daily for a total of 4 months.  Unlikely to use thalidomide in this patient given its toxicity and unclear efficacy.  Return to clinic in 2 weeks for further evaluation and continuation of octreotide  and possible transfusion.  At that point, we can switch her back to monthly injections.     Leukopenia: Mild, monitor.   History of AVMs: EGD and octreotide  as above.  Patient thinks she has an appointment with GI next week.   Tendon repair: Improved. Coping/adjustment: Patient was previously given a referral to [facility].   I spent a total of 30 minutes reviewing chart data, face-to-face evaluation with the patient, counseling and coordination of care as detailed above.  Patient expressed understanding and was in agreement with this plan. She also understands that She can call clinic at any time with any questions, concerns, or complaints.    Evalene JINNY Reusing, MD   12/05/2023 11:02 AM

## 2023-12-06 ENCOUNTER — Inpatient Hospital Stay

## 2023-12-18 ENCOUNTER — Other Ambulatory Visit: Payer: Self-pay | Admitting: *Deleted

## 2023-12-18 DIAGNOSIS — D509 Iron deficiency anemia, unspecified: Secondary | ICD-10-CM

## 2023-12-19 ENCOUNTER — Inpatient Hospital Stay

## 2023-12-19 ENCOUNTER — Encounter: Payer: Self-pay | Admitting: Oncology

## 2023-12-19 ENCOUNTER — Inpatient Hospital Stay (HOSPITAL_BASED_OUTPATIENT_CLINIC_OR_DEPARTMENT_OTHER): Admitting: Oncology

## 2023-12-19 VITALS — BP 142/73 | HR 88 | Temp 97.3°F | Resp 18 | Ht 63.0 in | Wt 150.0 lb

## 2023-12-19 DIAGNOSIS — D509 Iron deficiency anemia, unspecified: Secondary | ICD-10-CM

## 2023-12-19 LAB — CBC WITH DIFFERENTIAL/PLATELET
Abs Immature Granulocytes: 0.01 K/uL (ref 0.00–0.07)
Basophils Absolute: 0 K/uL (ref 0.0–0.1)
Basophils Relative: 1 %
Eosinophils Absolute: 0 K/uL (ref 0.0–0.5)
Eosinophils Relative: 1 %
HCT: 25.4 % — ABNORMAL LOW (ref 36.0–46.0)
Hemoglobin: 7.7 g/dL — ABNORMAL LOW (ref 12.0–15.0)
Immature Granulocytes: 0 %
Lymphocytes Relative: 27 %
Lymphs Abs: 1.1 K/uL (ref 0.7–4.0)
MCH: 24.6 pg — ABNORMAL LOW (ref 26.0–34.0)
MCHC: 30.3 g/dL (ref 30.0–36.0)
MCV: 81.2 fL (ref 80.0–100.0)
Monocytes Absolute: 0.4 K/uL (ref 0.1–1.0)
Monocytes Relative: 10 %
Neutro Abs: 2.6 K/uL (ref 1.7–7.7)
Neutrophils Relative %: 61 %
Platelets: 344 K/uL (ref 150–400)
RBC: 3.13 MIL/uL — ABNORMAL LOW (ref 3.87–5.11)
RDW: 17.3 % — ABNORMAL HIGH (ref 11.5–15.5)
WBC: 4.2 K/uL (ref 4.0–10.5)
nRBC: 0 % (ref 0.0–0.2)

## 2023-12-19 LAB — IRON AND TIBC
Iron: 26 ug/dL — ABNORMAL LOW (ref 28–170)
Saturation Ratios: 7 % — ABNORMAL LOW (ref 10.4–31.8)
TIBC: 363 ug/dL (ref 250–450)
UIBC: 337 ug/dL

## 2023-12-19 LAB — PREPARE RBC (CROSSMATCH)

## 2023-12-19 LAB — FERRITIN: Ferritin: 7 ng/mL — ABNORMAL LOW (ref 11–307)

## 2023-12-19 MED ORDER — OCTREOTIDE ACETATE 30 MG IM KIT
30.0000 mg | PACK | Freq: Once | INTRAMUSCULAR | Status: AC
  Administered 2023-12-19: 30 mg via INTRAMUSCULAR
  Filled 2023-12-19: qty 1

## 2023-12-19 NOTE — Progress Notes (Signed)
 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  INTERVAL HISTORY: Patient returns to clinic today for repeat laboratory, further evaluation, and consideration of octreotide  and possible blood transfusion.  She continues to have chronic fatigue, but otherwise feels well.  She has noted no further melena.  She had no neurologic complaints.  She denies any recent fevers or illnesses.  She has a fair appetite.  She denies any chest pain, shortness of breath, cough, or hemoptysis.  She denies any nausea, vomiting, constipation, or diarrhea.  She had has no hematochezia.  She has no urinary complaints.  Patient offers no further specific complaints today.  REVIEW OF SYSTEMS:   Review of Systems  Constitutional:  Positive for malaise/fatigue. Negative for fever and weight loss.  Respiratory: Negative.  Negative for cough and shortness of breath.   Cardiovascular: Negative.  Negative for chest pain and leg swelling.  Gastrointestinal: Negative.  Negative for abdominal pain, blood in stool and melena.  Genitourinary: Negative.  Negative for hematuria.  Musculoskeletal: Negative.  Negative for back pain.  Skin: Negative.  Negative for rash.  Neurological: Negative.  Negative for dizziness, focal weakness, weakness and headaches.  Psychiatric/Behavioral: Negative.  The patient is not nervous/anxious.     As per HPI. Otherwise, a complete review of systems is negative.  PAST MEDICAL HISTORY: Past Medical History:  Diagnosis Date   Anemia    Arthritis    Breast cancer (HCC) 1990   Right   COPD (chronic obstructive pulmonary disease) (HCC)    Dyspnea    DOE   Elevated lipids     Hypertension    Lower extremity edema    Migraines    Migraines    Personal history of radiation therapy 1990   Breast   Restless leg syndrome    Rotator cuff injury     PAST SURGICAL HISTORY: Past Surgical History:  Procedure Laterality Date   ABDOMINAL HYSTERECTOMY     BACK SURGERY  08/2017   CERVICAL FUSION   BREAST BIOPSY Right 1990   LUMPECTOMY.  took lymph nodes   BREAST LUMPECTOMY     COLONOSCOPY WITH PROPOFOL  N/A 07/20/2017   Procedure: COLONOSCOPY WITH PROPOFOL ;  Surgeon: Janalyn Keene NOVAK, MD;  Location: [facility] ENDOSCOPY;  Service: Endoscopy;  Laterality: N/A;   COLONOSCOPY WITH PROPOFOL  N/A 01/27/2022   Procedure: COLONOSCOPY WITH PROPOFOL ;  Surgeon: Therisa Bi, MD;  Location: [facility] ENDOSCOPY;  Service: Gastroenterology;  Laterality: N/A;   ENTEROSCOPY N/A 08/08/2017   Procedure: ENTEROSCOPY;  Surgeon: Unk Corinn Skiff, MD;  Location: [facility] ENDOSCOPY;  Service: Gastroenterology;  Laterality: N/A;   ENTEROSCOPY N/A 05/07/2020   Procedure: ENTEROSCOPY with Adult colonoscope;  Surgeon: Janalyn Keene NOVAK, MD;  Location: [facility] ENDOSCOPY;  Service: Endoscopy;  Laterality: N/A;   ENTEROSCOPY N/A 03/17/2021   Procedure: ENTEROSCOPY;  Surgeon: Janalyn Keene NOVAK, MD;  Location: [facility] ENDOSCOPY;  Service: Endoscopy;  Laterality: N/A;  PUSH   ENTEROSCOPY N/A 07/13/2021   Procedure: ENTEROSCOPY;  Surgeon: Unk Corinn Skiff, MD;  Location: [facility] ENDOSCOPY;  Service: Gastroenterology;  Laterality: N/A;   ENTEROSCOPY N/A 10/26/2021   Procedure: ENTEROSCOPY;  Surgeon: Unk Corinn Skiff, MD;  Location: [facility] ENDOSCOPY;  Service: Gastroenterology;  Laterality: N/A;  push   ENTEROSCOPY N/A 07/27/2022  Procedure: ENTEROSCOPY;  Surgeon: Jinny Carmine, MD;  Location: [facility] ENDOSCOPY;  Service: Endoscopy;  Laterality: N/A;  Push   ENTEROSCOPY N/A 06/29/2023   Procedure: ENTEROSCOPY;  Surgeon: Therisa Bi, MD;  Location: [facility] ENDOSCOPY;  Service: Gastroenterology;  Laterality: N/A;  Push    ENTEROSCOPY N/A 08/10/2023   Procedure: ENTEROSCOPY;  Surgeon: Unk Corinn Skiff, MD;  Location: [facility] ENDOSCOPY;  Service: Gastroenterology;  Laterality: N/A;  Push   ESOPHAGOGASTRODUODENOSCOPY Left 04/13/2017   Procedure: ESOPHAGOGASTRODUODENOSCOPY (EGD);  Surgeon: Janalyn Keene NOVAK, MD;  Location: [facility] ENDOSCOPY;  Service: Endoscopy;  Laterality: Left;   ESOPHAGOGASTRODUODENOSCOPY (EGD) WITH PROPOFOL  N/A 07/20/2017   Procedure: ESOPHAGOGASTRODUODENOSCOPY (EGD) WITH PROPOFOL ;  Surgeon: Janalyn Keene NOVAK, MD;  Location: [facility] ENDOSCOPY;  Service: Endoscopy;  Laterality: N/A;   ESOPHAGOGASTRODUODENOSCOPY (EGD) WITH PROPOFOL  N/A 12/12/2017   Procedure: ESOPHAGOGASTRODUODENOSCOPY (EGD) WITH PROPOFOL ;  Surgeon: Janalyn Keene NOVAK, MD;  Location: [facility] ENDOSCOPY;  Service: Endoscopy;  Laterality: N/A;   ESOPHAGOGASTRODUODENOSCOPY (EGD) WITH PROPOFOL  N/A 12/23/2020   Procedure: ESOPHAGOGASTRODUODENOSCOPY (EGD) WITH PROPOFOL ;  Surgeon: Janalyn Keene NOVAK, MD;  Location: [facility] ENDOSCOPY;  Service: Endoscopy;  Laterality: N/A;   ESOPHAGOGASTRODUODENOSCOPY (EGD) WITH PROPOFOL  N/A 01/27/2022   Procedure: ESOPHAGOGASTRODUODENOSCOPY (EGD) WITH PROPOFOL ;  Surgeon: Therisa Bi, MD;  Location: [facility] ENDOSCOPY;  Service: Gastroenterology;  Laterality: N/A;   GIVENS CAPSULE STUDY N/A 03/10/2021   Procedure: GIVENS CAPSULE STUDY;  Surgeon: Janalyn Keene NOVAK, MD;  Location: [facility] ENDOSCOPY;  Service: Endoscopy;  Laterality: N/A;   HOT HEMOSTASIS  06/29/2023   Procedure: HOT HEMOSTASIS (ARGON PLASMA COAGULATION/BICAP);  Surgeon: Therisa Bi, MD;  Location: [facility] ENDOSCOPY;  Service: Gastroenterology;;   HOT HEMOSTASIS  08/10/2023   Procedure: EGD, WITH ARGON PLASMA COAGULATION;  Surgeon: Unk Corinn Skiff, MD;  Location: [facility] ENDOSCOPY;  Service: Gastroenterology;;   MEDIAL PARTIAL KNEE REPLACEMENT Left 1990   torn ligament. no knee replacement at that time   PARTIAL HYSTERECTOMY     SHOULDER ARTHROSCOPY WITH  OPEN ROTATOR CUFF REPAIR Left 03/01/2016   Procedure: SHOULDER ARTHROSCOPY WITH OPEN ROTATOR CUFF REPAIR;  Surgeon: Franky Cranker, MD;  Location: [facility] ORS;  Service: Orthopedics;  Laterality: Left;   TOTAL KNEE ARTHROPLASTY Left 02/14/2018   Procedure: TOTAL KNEE ARTHROPLASTY;  Surgeon: Cranker Franky, MD;  Location: [facility] ORS;  Service: Orthopedics;  Laterality: Left;    FAMILY HISTORY: Family History  Problem Relation Age of Onset   CAD Mother    CAD Sister    Breast cancer Neg Hx     ADVANCED DIRECTIVES (Y/N):  N  HEALTH MAINTENANCE: Social History   Tobacco Use   Smoking status: Some Days    Current packs/day: 0.00    Average packs/day: 0.3 packs/day for 50.0 years (12.5 ttl pk-yrs)    Types: Cigarettes    Start date: 03/13/1966    Last attempt to quit: 03/13/2016    Years since quitting: 7.7   Smokeless tobacco: Never  Vaping Use   Vaping status: Never Used  Substance Use Topics   Alcohol use: No   Drug use: No     Colonoscopy:  PAP:  Bone density:  Lipid panel:  Allergies  Allergen Reactions   Latex Rash    Welts over body    Current Outpatient Medications  Medication Sig Dispense Refill   albuterol  (VENTOLIN  HFA) 108 (90 Base) MCG/ACT inhaler Inhale 2 puffs into the lungs every 6 (six) hours as needed for wheezing.     amitriptyline  (ELAVIL ) 25 MG tablet Take 25 mg by mouth at bedtime.  amitriptyline  (ELAVIL ) 50 MG tablet Take 50 mg by mouth at bedtime.     budesonide-formoterol  (SYMBICORT) 160-4.5 MCG/ACT inhaler Inhale 2 puffs into the lungs 2 (two) times daily.     escitalopram (LEXAPRO) 5 MG tablet Take 5 mg by mouth daily.     furosemide  (LASIX ) 20 MG tablet Take 20 mg daily by mouth.      lisinopril  (PRINIVIL ,ZESTRIL ) 20 MG tablet Take 20 mg by mouth daily.     omeprazole  (PRILOSEC ) 40 MG capsule Take 1 capsule (40 mg total) by mouth daily. 90 capsule 3   potassium chloride  (KLOR-CON  M) 10 MEQ tablet Take 10 mEq by mouth daily.     rOPINIRole   (REQUIP ) 2 MG tablet Take 2-4 mg by mouth at bedtime as needed (restless leg syndrome).     simvastatin  (ZOCOR ) 20 MG tablet Take 20 mg by mouth at bedtime.     SPIRIVA  HANDIHALER 18 MCG inhalation capsule Place 18 mcg into inhaler and inhale daily.     SUMAtriptan  6 MG/0.5ML SOAJ Inject into the skin.     vitamin B-12 (CYANOCOBALAMIN ) 1000 MCG tablet Take 1,000 mcg by mouth daily.     No current facility-administered medications for this visit.   Facility-Administered Medications Ordered in Other Visits  Medication Dose Route Frequency Provider Last Rate Last Admin   octreotide  (SANDOSTATIN  LAR) IM injection 30 mg  30 mg Intramuscular Once Jakeya Gherardi J, MD        OBJECTIVE: Vitals:   12/19/23 0936  BP: (!) 142/73  Pulse: 88  Resp: 18  Temp: (!) 97.3 F (36.3 C)  SpO2: 97%      Body mass index is 26.57 kg/m.    ECOG FS:0 - Asymptomatic  General: Well-developed, well-nourished, no acute distress. Eyes: Pink conjunctiva, anicteric sclera. HEENT: Normocephalic, moist mucous membranes. Lungs: No audible wheezing or coughing. Heart: Regular rate and rhythm. Abdomen: Soft, nontender, no obvious distention. Musculoskeletal: No edema, cyanosis, or clubbing. Neuro: Alert, answering all questions appropriately. Cranial nerves grossly intact. Skin: No rashes or petechiae noted. Psych: Normal affect.  LAB RESULTS:  Lab Results  Component Value Date   NA 144 08/10/2023   K 3.7 08/10/2023   CL 109 08/10/2023   CO2 29 08/10/2023   GLUCOSE 104 (H) 08/10/2023   BUN 17 08/10/2023   CREATININE 0.68 08/10/2023   CALCIUM 8.2 (L) 08/10/2023   PROT 5.7 (L) 06/13/2023   ALBUMIN 3.3 (L) 06/13/2023   AST 17 06/13/2023   ALT 13 06/13/2023   ALKPHOS 28 (L) 06/13/2023   BILITOT 0.4 06/13/2023   GFRNONAA >60 08/10/2023   GFRAA >60 02/16/2018    Lab Results  Component Value Date   WBC 4.2 12/19/2023   NEUTROABS 2.6 12/19/2023   HGB 7.7 (L) 12/19/2023   HCT 25.4 (L) 12/19/2023    MCV 81.2 12/19/2023   PLT 344 12/19/2023   Lab Results  Component Value Date   IRON  39 11/22/2023   TIBC 357 11/22/2023   IRONPCTSAT 11 11/22/2023   Lab Results  Component Value Date   FERRITIN 30 11/22/2023     STUDIES: No results found.    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  Patient's hemoglobin has trended down to 7.7 therefore we will proceed with 1 unit of packed red blood cells  tomorrow.  She does not require any further Venofer  with normal iron  stores from November 22, 2023.  Proceed with octreotide  as scheduled.   Recommendation from GI is to pursue octreotide  every 2 weeks as scheduled for approximately 4 months.  The every 2-week treatment of octreotide  was started on August 20, 2023.  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  The dose of thalidomide is 100 mg daily for a total of 4 months.  Unlikely to use thalidomide in this patient given its toxicity and unclear efficacy.  It appears patient only requires transfusion once per month at this point, therefore she will return to clinic in 4 weeks with repeat laboratory work, further evaluation, continuation of octreotide  as well as consideration of blood transfusion.      Leukopenia: Resolved. History of AVMs: EGD and octreotide  as above.  Follow-up with GI as indicated.   Tendon repair: Improved. Coping/adjustment: Patient was previously given a referral to [facility].  I spent a total of 30 minutes reviewing chart data, face-to-face evaluation with the patient, counseling and coordination of care as detailed above.   Patient expressed understanding and was in agreement with this plan.  She also understands that She can call clinic at any time with any questions, concerns, or complaints.    Evalene JINNY Reusing, MD   12/19/2023 10:01 AM

## 2023-12-19 NOTE — Progress Notes (Signed)
 Patient is doing ok, no new questions or concerns for the doctor today. She said that she isn't bleeding any from her rectum, like she has been.

## 2023-12-20 ENCOUNTER — Inpatient Hospital Stay

## 2023-12-20 DIAGNOSIS — D509 Iron deficiency anemia, unspecified: Secondary | ICD-10-CM

## 2023-12-20 MED ORDER — SODIUM CHLORIDE 0.9% IV SOLUTION
250.0000 mL | INTRAVENOUS | Status: DC
  Administered 2023-12-20: 250 mL via INTRAVENOUS
  Filled 2023-12-20: qty 250

## 2023-12-20 MED ORDER — ACETAMINOPHEN 325 MG PO TABS
650.0000 mg | ORAL_TABLET | Freq: Once | ORAL | Status: AC
  Administered 2023-12-20: 650 mg via ORAL
  Filled 2023-12-20: qty 2

## 2023-12-20 MED ORDER — DIPHENHYDRAMINE HCL 50 MG/ML IJ SOLN
25.0000 mg | Freq: Once | INTRAMUSCULAR | Status: AC
  Administered 2023-12-20: 25 mg via INTRAVENOUS
  Filled 2023-12-20: qty 1

## 2023-12-20 NOTE — Patient Instructions (Signed)
 Blood Transfusion, Adult A blood transfusion is a procedure in which you receive blood through an IV tube. You may need this procedure because of: A bleeding disorder. An illness. An injury. A surgery. The blood may come from someone else (a donor). You may also be able to donate blood for yourself before a surgery. The blood given in a transfusion may be made up of different types of cells. You may get: Red blood cells. These carry oxygen to the cells in the body. Platelets. These help your blood to clot. Plasma. This is the liquid part of your blood. It carries proteins and other substances through the body. White blood cells. These help you fight infections. If you have a clotting disorder, you may also get other types of blood products. Depending on the type of blood product, this procedure may take 1-4 hours to complete. Tell your doctor about: Any bleeding problems you have. Any reactions you have had during a blood transfusion in the past. Any allergies you have. All medicines you are taking, including vitamins, herbs, eye drops, creams, and over-the-counter medicines. Any surgeries you have had. Any medical conditions you have. Whether you are pregnant or may be pregnant. What are the risks? Talk with your health care provider about risks. The most common problems include: A mild allergic reaction. This includes red, swollen areas of skin (hives) and itching. Fever or chills. This may be the body's response to new blood cells received. This may happen during or up to 4 hours after the transfusion. More serious problems may include: A serious allergic reaction. This includes breathing trouble or swelling around the face and lips. Too much fluid in the lungs. This may cause breathing problems. Lung injury. This causes breathing trouble and low oxygen in the blood. This can happen within hours of the transfusion or days later. Too much iron. This can happen after getting many blood  transfusions over a period of time. An infection or virus passed through the blood. This is rare. Donated blood is carefully tested before it is given. Your body's defense system (immune system) trying to attack the new blood cells. This is rare. Symptoms may include fever, chills, nausea, low blood pressure, and low back or chest pain. Donated cells attacking healthy tissues. This is rare. What happens before the procedure? You will have a blood test to find out your blood type. The test also finds out what type of blood your body will accept and matches it to the donor type. If you are going to have a planned surgery, you may be able to donate your own blood. This may be done in case you need a transfusion. You will have your temperature, blood pressure, and pulse checked. You may receive medicine to help prevent an allergic reaction. This may be done if you have had a reaction to a transfusion before. This medicine may be given to you by mouth or through an IV tube. What happens during the procedure?  An IV tube will be put into one of your veins. The bag of blood will be attached to your IV tube. Then, the blood will enter through your vein. Your temperature, blood pressure, and pulse will be checked often. This is done to find early signs of a transfusion reaction. Tell your nurse right away if you have any of these symptoms: Shortness of breath or trouble breathing. Chest or back pain. Fever or chills. Red, swollen areas of skin or itching. If you have any signs  or symptoms of a reaction, your transfusion will be stopped. You may also be given medicine. When the transfusion is finished, your IV tube will be taken out. Pressure may be put on the IV site for a few minutes. A bandage (dressing) will be put on the IV site. The procedure may vary among doctors and hospitals. What happens after the procedure? You will be monitored until you leave the hospital or clinic. This includes  checking your temperature, blood pressure, pulse, breathing rate, and blood oxygen level. Your blood may be tested to see how you have responded to the transfusion. You may be warmed with fluids or blankets. This is done to keep the temperature of your body normal. If you have your procedure in an outpatient setting, you will be told whom to contact to report any reactions. Where to find more information Visit the [employer]: redcross.org Summary A blood transfusion is a procedure in which you receive blood through an IV tube. The blood you are given may be made up of different blood cells. You may receive red blood cells, platelets, plasma, or white blood cells. Your temperature, blood pressure, and pulse will be checked often. After the procedure, your blood may be tested to see how you have responded. This information is not intended to replace advice given to you by your health care provider. Make sure you discuss any questions you have with your health care provider. Document Revised: 08/19/2021 Document Reviewed: 08/19/2021 Elsevier Patient Education  2024 [employer].

## 2023-12-21 LAB — BPAM RBC
Blood Product Expiration Date: 202508192359
ISSUE DATE / TIME: 202507170902
Unit Type and Rh: 5100

## 2023-12-21 LAB — TYPE AND SCREEN
ABO/RH(D): O POS
Antibody Screen: NEGATIVE
Unit division: 0

## 2024-01-16 ENCOUNTER — Encounter: Payer: Self-pay | Admitting: Oncology

## 2024-01-16 ENCOUNTER — Inpatient Hospital Stay: Attending: Oncology

## 2024-01-16 ENCOUNTER — Inpatient Hospital Stay

## 2024-01-16 ENCOUNTER — Inpatient Hospital Stay: Admitting: Oncology

## 2024-01-16 VITALS — BP 100/58 | HR 87 | Temp 97.3°F | Resp 18 | Ht 63.0 in | Wt 146.6 lb

## 2024-01-16 DIAGNOSIS — G2581 Restless legs syndrome: Secondary | ICD-10-CM | POA: Diagnosis not present

## 2024-01-16 DIAGNOSIS — Z79899 Other long term (current) drug therapy: Secondary | ICD-10-CM | POA: Diagnosis not present

## 2024-01-16 DIAGNOSIS — D75839 Thrombocytosis, unspecified: Secondary | ICD-10-CM | POA: Diagnosis not present

## 2024-01-16 DIAGNOSIS — I10 Essential (primary) hypertension: Secondary | ICD-10-CM | POA: Insufficient documentation

## 2024-01-16 DIAGNOSIS — D509 Iron deficiency anemia, unspecified: Secondary | ICD-10-CM

## 2024-01-16 DIAGNOSIS — F17210 Nicotine dependence, cigarettes, uncomplicated: Secondary | ICD-10-CM | POA: Diagnosis not present

## 2024-01-16 DIAGNOSIS — K922 Gastrointestinal hemorrhage, unspecified: Secondary | ICD-10-CM | POA: Insufficient documentation

## 2024-01-16 DIAGNOSIS — Z7951 Long term (current) use of inhaled steroids: Secondary | ICD-10-CM | POA: Insufficient documentation

## 2024-01-16 DIAGNOSIS — J449 Chronic obstructive pulmonary disease, unspecified: Secondary | ICD-10-CM | POA: Insufficient documentation

## 2024-01-16 DIAGNOSIS — Z853 Personal history of malignant neoplasm of breast: Secondary | ICD-10-CM | POA: Diagnosis not present

## 2024-01-16 LAB — CBC WITH DIFFERENTIAL/PLATELET
Abs Immature Granulocytes: 0.01 K/uL (ref 0.00–0.07)
Basophils Absolute: 0 K/uL (ref 0.0–0.1)
Basophils Relative: 0 %
Eosinophils Absolute: 0.1 K/uL (ref 0.0–0.5)
Eosinophils Relative: 1 %
HCT: 20.9 % — ABNORMAL LOW (ref 36.0–46.0)
Hemoglobin: 6.3 g/dL — CL (ref 12.0–15.0)
Immature Granulocytes: 0 %
Lymphocytes Relative: 24 %
Lymphs Abs: 1.2 K/uL (ref 0.7–4.0)
MCH: 23.6 pg — ABNORMAL LOW (ref 26.0–34.0)
MCHC: 30.1 g/dL (ref 30.0–36.0)
MCV: 78.3 fL — ABNORMAL LOW (ref 80.0–100.0)
Monocytes Absolute: 0.5 K/uL (ref 0.1–1.0)
Monocytes Relative: 10 %
Neutro Abs: 3.2 K/uL (ref 1.7–7.7)
Neutrophils Relative %: 65 %
Platelets: 410 K/uL — ABNORMAL HIGH (ref 150–400)
RBC: 2.67 MIL/uL — ABNORMAL LOW (ref 3.87–5.11)
RDW: 17.9 % — ABNORMAL HIGH (ref 11.5–15.5)
WBC: 4.9 K/uL (ref 4.0–10.5)
nRBC: 0 % (ref 0.0–0.2)

## 2024-01-16 LAB — PREPARE RBC (CROSSMATCH)

## 2024-01-16 MED ORDER — OCTREOTIDE ACETATE 30 MG IM KIT
30.0000 mg | PACK | Freq: Once | INTRAMUSCULAR | Status: AC
Start: 2024-01-16 — End: 2024-01-16
  Administered 2024-01-16 (×2): 30 mg via INTRAMUSCULAR
  Filled 2024-01-16: qty 1

## 2024-01-16 NOTE — Progress Notes (Signed)
 [facility]  Telephone:([phone] Fax:(336) [phone]  ID: Hailey Farrell OB: [birth year]  MR#: [id number]  RDW#:[id number]  Patient Care Team: Kandis Stefano Iles, MD as PCP - General (Family Medicine) Jacobo Evalene PARAS, MD as Consulting Physician (Oncology)  CHIEF COMPLAINT: Iron  deficiency anemia secondary to AVM/GI bleed.  INTERVAL HISTORY: Patient returns to clinic today for repeat laboratory, further evaluation, and consideration of blood transfusion.  Patient has noticed increased fatigue over the past week, but otherwise has felt well.  She continues to deny any melena or hematochezia. She had no neurologic complaints.  She denies any recent fevers or illnesses.  She has a fair appetite.  She denies any chest pain, shortness of breath, cough, or hemoptysis.  She denies any nausea, vomiting, constipation, or diarrhea.  She has no urinary complaints.  Patient offers no further specific complaints today.  REVIEW OF SYSTEMS:   Review of Systems  Constitutional:  Positive for malaise/fatigue. Negative for fever and weight loss.  Respiratory: Negative.  Negative for cough and shortness of breath.   Cardiovascular: Negative.  Negative for chest pain and leg swelling.  Gastrointestinal: Negative.  Negative for abdominal pain, blood in stool and melena.  Genitourinary: Negative.  Negative for hematuria.  Musculoskeletal: Negative.  Negative for back pain.  Skin: Negative.  Negative for rash.  Neurological: Negative.  Negative for dizziness, focal weakness, weakness and headaches.  Psychiatric/Behavioral: Negative.  The patient is not nervous/anxious.     As per HPI. Otherwise, a complete review of systems is negative.  PAST MEDICAL HISTORY: Past Medical History:  Diagnosis Date   Anemia    Arthritis    Breast cancer (HCC) 1990   Right   COPD (chronic obstructive pulmonary disease) (HCC)    Dyspnea    DOE   Elevated lipids    Hypertension    Lower  extremity edema    Migraines    Migraines    Personal history of radiation therapy 1990   Breast   Restless leg syndrome    Rotator cuff injury     PAST SURGICAL HISTORY: Past Surgical History:  Procedure Laterality Date   ABDOMINAL HYSTERECTOMY     BACK SURGERY  08/2017   CERVICAL FUSION   BREAST BIOPSY Right 1990   LUMPECTOMY.  took lymph nodes   BREAST LUMPECTOMY     COLONOSCOPY WITH PROPOFOL  N/A 07/20/2017   Procedure: COLONOSCOPY WITH PROPOFOL ;  Surgeon: Janalyn Keene NOVAK, MD;  Location: [facility] ENDOSCOPY;  Service: Endoscopy;  Laterality: N/A;   COLONOSCOPY WITH PROPOFOL  N/A 01/27/2022   Procedure: COLONOSCOPY WITH PROPOFOL ;  Surgeon: Therisa Bi, MD;  Location: [facility] ENDOSCOPY;  Service: Gastroenterology;  Laterality: N/A;   ENTEROSCOPY N/A 08/08/2017   Procedure: ENTEROSCOPY;  Surgeon: Unk Corinn Skiff, MD;  Location: [facility] [place] ENDOSCOPY;  Service: Gastroenterology;  Laterality: N/A;   ENTEROSCOPY N/A 05/07/2020   Procedure: ENTEROSCOPY with Adult colonoscope;  Surgeon: Janalyn Keene NOVAK, MD;  Location: [facility] ENDOSCOPY;  Service: Endoscopy;  Laterality: N/A;   ENTEROSCOPY N/A 03/17/2021   Procedure: ENTEROSCOPY;  Surgeon: Janalyn Keene NOVAK, MD;  Location: [facility] ENDOSCOPY;  Service: Endoscopy;  Laterality: N/A;  PUSH   ENTEROSCOPY N/A 07/13/2021   Procedure: ENTEROSCOPY;  Surgeon: Unk Corinn Skiff, MD;  Location: [facility] ENDOSCOPY;  Service: Gastroenterology;  Laterality: N/A;   ENTEROSCOPY N/A 10/26/2021   Procedure: ENTEROSCOPY;  Surgeon: Unk Corinn Skiff, MD;  Location: [facility] ENDOSCOPY;  Service: Gastroenterology;  Laterality: N/A;  push   ENTEROSCOPY N/A 07/27/2022   Procedure: ENTEROSCOPY;  Surgeon: Jinny Carmine, MD;  Location: [facility] ENDOSCOPY;  Service: Endoscopy;  Laterality: N/A;  Push   ENTEROSCOPY N/A 06/29/2023   Procedure: ENTEROSCOPY;  Surgeon: Therisa Bi, MD;  Location: [facility] ENDOSCOPY;  Service: Gastroenterology;  Laterality: N/A;  Push   ENTEROSCOPY N/A 08/10/2023    Procedure: ENTEROSCOPY;  Surgeon: Unk Corinn Skiff, MD;  Location: [facility] ENDOSCOPY;  Service: Gastroenterology;  Laterality: N/A;  Push   ESOPHAGOGASTRODUODENOSCOPY Left 04/13/2017   Procedure: ESOPHAGOGASTRODUODENOSCOPY (EGD);  Surgeon: Janalyn Keene NOVAK, MD;  Location: [facility] ENDOSCOPY;  Service: Endoscopy;  Laterality: Left;   ESOPHAGOGASTRODUODENOSCOPY (EGD) WITH PROPOFOL  N/A 07/20/2017   Procedure: ESOPHAGOGASTRODUODENOSCOPY (EGD) WITH PROPOFOL ;  Surgeon: Janalyn Keene NOVAK, MD;  Location: [facility] ENDOSCOPY;  Service: Endoscopy;  Laterality: N/A;   ESOPHAGOGASTRODUODENOSCOPY (EGD) WITH PROPOFOL  N/A 12/12/2017   Procedure: ESOPHAGOGASTRODUODENOSCOPY (EGD) WITH PROPOFOL ;  Surgeon: Janalyn Keene NOVAK, MD;  Location: [facility] ENDOSCOPY;  Service: Endoscopy;  Laterality: N/A;   ESOPHAGOGASTRODUODENOSCOPY (EGD) WITH PROPOFOL  N/A 12/23/2020   Procedure: ESOPHAGOGASTRODUODENOSCOPY (EGD) WITH PROPOFOL ;  Surgeon: Janalyn Keene NOVAK, MD;  Location: [facility] ENDOSCOPY;  Service: Endoscopy;  Laterality: N/A;   ESOPHAGOGASTRODUODENOSCOPY (EGD) WITH PROPOFOL  N/A 01/27/2022   Procedure: ESOPHAGOGASTRODUODENOSCOPY (EGD) WITH PROPOFOL ;  Surgeon: Therisa Bi, MD;  Location: [facility] ENDOSCOPY;  Service: Gastroenterology;  Laterality: N/A;   GIVENS CAPSULE STUDY N/A 03/10/2021   Procedure: GIVENS CAPSULE STUDY;  Surgeon: Janalyn Keene NOVAK, MD;  Location: [facility] ENDOSCOPY;  Service: Endoscopy;  Laterality: N/A;   HOT HEMOSTASIS  06/29/2023   Procedure: HOT HEMOSTASIS (ARGON PLASMA COAGULATION/BICAP);  Surgeon: Therisa Bi, MD;  Location: [facility] ENDOSCOPY;  Service: Gastroenterology;;   HOT HEMOSTASIS  08/10/2023   Procedure: EGD, WITH ARGON PLASMA COAGULATION;  Surgeon: Unk Corinn Skiff, MD;  Location: [facility] ENDOSCOPY;  Service: Gastroenterology;;   MEDIAL PARTIAL KNEE REPLACEMENT Left 1990   torn ligament. no knee replacement at that time   PARTIAL HYSTERECTOMY     SHOULDER ARTHROSCOPY WITH OPEN ROTATOR CUFF REPAIR  Left 03/01/2016   Procedure: SHOULDER ARTHROSCOPY WITH OPEN ROTATOR CUFF REPAIR;  Surgeon: Franky Cranker, MD;  Location: [facility] ORS;  Service: Orthopedics;  Laterality: Left;   TOTAL KNEE ARTHROPLASTY Left 02/14/2018   Procedure: TOTAL KNEE ARTHROPLASTY;  Surgeon: Cranker Franky, MD;  Location: [facility] ORS;  Service: Orthopedics;  Laterality: Left;    FAMILY HISTORY: Family History  Problem Relation Age of Onset   CAD Mother    CAD Sister    Breast cancer Neg Hx     ADVANCED DIRECTIVES (Y/N):  N  HEALTH MAINTENANCE: Social History   Tobacco Use   Smoking status: Some Days    Current packs/day: 0.00    Average packs/day: 0.3 packs/day for 50.0 years (12.5 ttl pk-yrs)    Types: Cigarettes    Start date: 03/13/1966    Last attempt to quit: 03/13/2016    Years since quitting: 7.8   Smokeless tobacco: Never  Vaping Use   Vaping status: Never Used  Substance Use Topics   Alcohol use: No   Drug use: No     Colonoscopy:  PAP:  Bone density:  Lipid panel:  Allergies  Allergen Reactions   Latex Rash    Welts over body    Current Outpatient Medications  Medication Sig Dispense Refill   albuterol  (VENTOLIN  HFA) 108 (90 Base) MCG/ACT inhaler Inhale 2 puffs into the lungs every 6 (six) hours as needed for wheezing.     amitriptyline  (ELAVIL ) 25 MG tablet Take 25 mg by mouth at bedtime.     amitriptyline  (  ELAVIL ) 50 MG tablet Take 50 mg by mouth at bedtime.     budesonide-formoterol  (SYMBICORT) 160-4.5 MCG/ACT inhaler Inhale 2 puffs into the lungs 2 (two) times daily.     escitalopram (LEXAPRO) 5 MG tablet Take 5 mg by mouth daily.     furosemide  (LASIX ) 20 MG tablet Take 20 mg daily by mouth.      lisinopril  (PRINIVIL ,ZESTRIL ) 20 MG tablet Take 20 mg by mouth daily.     omeprazole  (PRILOSEC ) 40 MG capsule Take 1 capsule (40 mg total) by mouth daily. 90 capsule 3   potassium chloride  (KLOR-CON  M) 10 MEQ tablet Take 10 mEq by mouth daily.     rOPINIRole  (REQUIP ) 2 MG tablet Take  2-4 mg by mouth at bedtime as needed (restless leg syndrome).     simvastatin  (ZOCOR ) 20 MG tablet Take 20 mg by mouth at bedtime.     SPIRIVA  HANDIHALER 18 MCG inhalation capsule Place 18 mcg into inhaler and inhale daily.     SUMAtriptan  6 MG/0.5ML SOAJ Inject into the skin.     vitamin B-12 (CYANOCOBALAMIN ) 1000 MCG tablet Take 1,000 mcg by mouth daily.     No current facility-administered medications for this visit.   Facility-Administered Medications Ordered in Other Visits  Medication Dose Route Frequency Provider Last Rate Last Admin   0.9 %  sodium chloride  infusion (Manually program via Guardrails IV Fluids)  250 mL Intravenous Continuous Jacobo Evalene PARAS, MD 10 mL/hr at 01/17/24 0843 100 mL at 01/17/24 0843    OBJECTIVE: Vitals:   01/16/24 1031  BP: (!) 100/58  Pulse: 87  Resp: 18  Temp: (!) 97.3 F (36.3 C)  SpO2: 99%      Body mass index is 25.97 kg/m.    ECOG FS:1 - Symptomatic but completely ambulatory  General: Well-developed, well-nourished, no acute distress. Eyes: Pink conjunctiva, anicteric sclera. HEENT: Normocephalic, moist mucous membranes. Lungs: No audible wheezing or coughing. Heart: Regular rate and rhythm. Abdomen: Soft, nontender, no obvious distention. Musculoskeletal: No edema, cyanosis, or clubbing. Neuro: Alert, answering all questions appropriately. Cranial nerves grossly intact. Skin: No rashes or petechiae noted. Psych: Normal affect.  LAB RESULTS:  Lab Results  Component Value Date   NA 144 08/10/2023   K 3.7 08/10/2023   CL 109 08/10/2023   CO2 29 08/10/2023   GLUCOSE 104 (H) 08/10/2023   BUN 17 08/10/2023   CREATININE 0.68 08/10/2023   CALCIUM 8.2 (L) 08/10/2023   PROT 5.7 (L) 06/13/2023   ALBUMIN 3.3 (L) 06/13/2023   AST 17 06/13/2023   ALT 13 06/13/2023   ALKPHOS 28 (L) 06/13/2023   BILITOT 0.4 06/13/2023   GFRNONAA >60 08/10/2023   GFRAA >60 02/16/2018    Lab Results  Component Value Date   WBC 4.9 01/16/2024    NEUTROABS 3.2 01/16/2024   HGB 6.3 (LL) 01/16/2024   HCT 20.9 (L) 01/16/2024   MCV 78.3 (L) 01/16/2024   PLT 410 (H) 01/16/2024   Lab Results  Component Value Date   IRON  26 (L) 12/19/2023   TIBC 363 12/19/2023   IRONPCTSAT 7 (L) 12/19/2023   Lab Results  Component Value Date   FERRITIN 7 (L) 12/19/2023     STUDIES: No results found.    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  Patient's  hemoglobin has trended down to 6.3 and she is symptomatic.  Return to clinic tomorrow for 2 units packed red blood cells.  Patient previously received octreotide  every 2 weeks for 4 months with no appreciable change in her transfusion requirement.  Proceed with octreotide  every 4 weeks.  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  The dose of thalidomide is 100 mg daily for a total of 4 months.  Unlikely to use thalidomide in this patient given its toxicity and unclear efficacy.  Return to clinic in 3 weeks for further evaluation and continuation of transfusions if necessary. Thrombocytosis: Likely reactive, monitor. History of AVMs: EGD and octreotide  as above.  Patient reports her next follow-up with GI is in November.   Tendon repair: Improved. Coping/adjustment: Patient was previously given a referral to [facility].  I spent a total of 30 minutes reviewing chart data, face-to-face evaluation with the patient, counseling and coordination of care as detailed above.    Patient expressed understanding and was in agreement with this plan. She also understands that She can call clinic at any time with any questions, concerns, or complaints.    Evalene JINNY Reusing,  MD   01/17/2024 10:39 AM

## 2024-01-16 NOTE — Progress Notes (Signed)
 Patient is feeling very fatigued and weak, nauseas,  she is feeling emotional, she is thankful for how the cancer center has helped her, it is all up to the lord now.

## 2024-01-17 ENCOUNTER — Inpatient Hospital Stay

## 2024-01-17 ENCOUNTER — Encounter: Payer: Self-pay | Admitting: Oncology

## 2024-01-17 DIAGNOSIS — D509 Iron deficiency anemia, unspecified: Secondary | ICD-10-CM | POA: Diagnosis not present

## 2024-01-17 MED ORDER — ACETAMINOPHEN 325 MG PO TABS
650.0000 mg | ORAL_TABLET | Freq: Once | ORAL | Status: AC
  Administered 2024-01-17: 650 mg via ORAL
  Filled 2024-01-17: qty 2

## 2024-01-17 MED ORDER — DIPHENHYDRAMINE HCL 50 MG/ML IJ SOLN
25.0000 mg | Freq: Once | INTRAMUSCULAR | Status: AC
  Administered 2024-01-17: 25 mg via INTRAVENOUS
  Filled 2024-01-17: qty 1

## 2024-01-17 MED ORDER — SODIUM CHLORIDE 0.9% IV SOLUTION
250.0000 mL | INTRAVENOUS | Status: DC
  Administered 2024-01-17: 100 mL via INTRAVENOUS
  Filled 2024-01-17: qty 250

## 2024-01-17 NOTE — Patient Instructions (Signed)

## 2024-01-18 LAB — TYPE AND SCREEN
ABO/RH(D): O POS
Antibody Screen: NEGATIVE
Unit division: 0
Unit division: 0

## 2024-01-18 LAB — BPAM RBC
Blood Product Expiration Date: 202509142359
Blood Product Expiration Date: 202509142359
ISSUE DATE / TIME: 202508140840
ISSUE DATE / TIME: 202508141055
Unit Type and Rh: 5100
Unit Type and Rh: 5100

## 2024-02-06 ENCOUNTER — Inpatient Hospital Stay (HOSPITAL_BASED_OUTPATIENT_CLINIC_OR_DEPARTMENT_OTHER): Admitting: Oncology

## 2024-02-06 ENCOUNTER — Inpatient Hospital Stay: Attending: Oncology

## 2024-02-06 ENCOUNTER — Encounter: Payer: Self-pay | Admitting: Oncology

## 2024-02-06 VITALS — BP 146/75 | HR 89 | Temp 96.6°F | Resp 16 | Wt 151.0 lb

## 2024-02-06 DIAGNOSIS — F17210 Nicotine dependence, cigarettes, uncomplicated: Secondary | ICD-10-CM | POA: Insufficient documentation

## 2024-02-06 DIAGNOSIS — K5521 Angiodysplasia of colon with hemorrhage: Secondary | ICD-10-CM | POA: Insufficient documentation

## 2024-02-06 DIAGNOSIS — D500 Iron deficiency anemia secondary to blood loss (chronic): Secondary | ICD-10-CM | POA: Insufficient documentation

## 2024-02-06 DIAGNOSIS — D509 Iron deficiency anemia, unspecified: Secondary | ICD-10-CM

## 2024-02-06 LAB — CBC WITH DIFFERENTIAL/PLATELET
Abs Immature Granulocytes: 0.01 K/uL (ref 0.00–0.07)
Basophils Absolute: 0 K/uL (ref 0.0–0.1)
Basophils Relative: 0 %
Eosinophils Absolute: 0.1 K/uL (ref 0.0–0.5)
Eosinophils Relative: 2 %
HCT: 27.6 % — ABNORMAL LOW (ref 36.0–46.0)
Hemoglobin: 8.4 g/dL — ABNORMAL LOW (ref 12.0–15.0)
Immature Granulocytes: 0 %
Lymphocytes Relative: 18 %
Lymphs Abs: 0.9 K/uL (ref 0.7–4.0)
MCH: 24.2 pg — ABNORMAL LOW (ref 26.0–34.0)
MCHC: 30.4 g/dL (ref 30.0–36.0)
MCV: 79.5 fL — ABNORMAL LOW (ref 80.0–100.0)
Monocytes Absolute: 0.4 K/uL (ref 0.1–1.0)
Monocytes Relative: 8 %
Neutro Abs: 3.5 K/uL (ref 1.7–7.7)
Neutrophils Relative %: 72 %
Platelets: 283 K/uL (ref 150–400)
RBC: 3.47 MIL/uL — ABNORMAL LOW (ref 3.87–5.11)
RDW: 19.4 % — ABNORMAL HIGH (ref 11.5–15.5)
WBC: 4.9 K/uL (ref 4.0–10.5)
nRBC: 0 % (ref 0.0–0.2)

## 2024-02-06 LAB — SAMPLE TO BLOOD BANK

## 2024-02-06 NOTE — Progress Notes (Signed)
 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  INTERVAL HISTORY: Patient returns to clinic today for repeat laboratory work, further evaluation, and consideration of blood transfusion.  Her fatigue has improved and she currently feels well.  She continues to deny any melena or hematochezia. She had no neurologic complaints.  She denies any recent fevers or illnesses.  She has a fair appetite.  She denies any chest pain, shortness of breath, cough, or hemoptysis.  She denies any nausea, vomiting, constipation, or diarrhea.  She has no urinary complaints.  Patient offers no specific complaints today.  REVIEW OF SYSTEMS:   Review of Systems  Constitutional: Negative.  Negative for fever, malaise/fatigue and weight loss.  Respiratory: Negative.  Negative for cough and shortness of breath.   Cardiovascular: Negative.  Negative for chest pain and leg swelling.  Gastrointestinal: Negative.  Negative for abdominal pain, blood in stool and melena.  Genitourinary: Negative.  Negative for hematuria.  Musculoskeletal: Negative.  Negative for back pain.  Skin: Negative.  Negative for rash.  Neurological: Negative.  Negative for dizziness, focal weakness, weakness and headaches.  Psychiatric/Behavioral: Negative.  The patient is not nervous/anxious.     As per HPI. Otherwise, a complete review of systems is negative.  PAST MEDICAL HISTORY: Past Medical History:  Diagnosis Date   Anemia    Arthritis    Breast cancer (HCC) 1990   Right   COPD (chronic obstructive pulmonary disease) (HCC)    Dyspnea    DOE   Elevated lipids    Hypertension    Lower extremity edema    Migraines     Migraines    Personal history of radiation therapy 1990   Breast   Restless leg syndrome    Rotator cuff injury     PAST SURGICAL HISTORY: Past Surgical History:  Procedure Laterality Date   ABDOMINAL HYSTERECTOMY     BACK SURGERY  08/2017   CERVICAL FUSION   BREAST BIOPSY Right 1990   LUMPECTOMY.  took lymph nodes   BREAST LUMPECTOMY     COLONOSCOPY WITH PROPOFOL  N/A 07/20/2017   Procedure: COLONOSCOPY WITH PROPOFOL ;  Surgeon: Janalyn Keene NOVAK, MD;  Location: [facility] ENDOSCOPY;  Service: Endoscopy;  Laterality: N/A;   COLONOSCOPY WITH PROPOFOL  N/A 01/27/2022   Procedure: COLONOSCOPY WITH PROPOFOL ;  Surgeon: Therisa Bi, MD;  Location: [facility] ENDOSCOPY;  Service: Gastroenterology;  Laterality: N/A;   ENTEROSCOPY N/A 08/08/2017   Procedure: ENTEROSCOPY;  Surgeon: Unk Corinn Skiff, MD;  Location: [facility] ENDOSCOPY;  Service: Gastroenterology;  Laterality: N/A;   ENTEROSCOPY N/A 05/07/2020   Procedure: ENTEROSCOPY with Adult colonoscope;  Surgeon: Janalyn Keene NOVAK, MD;  Location: [facility] ENDOSCOPY;  Service: Endoscopy;  Laterality: N/A;   ENTEROSCOPY N/A 03/17/2021   Procedure: ENTEROSCOPY;  Surgeon: Janalyn Keene NOVAK, MD;  Location: [facility] ENDOSCOPY;  Service: Endoscopy;  Laterality: N/A;  PUSH   ENTEROSCOPY N/A 07/13/2021   Procedure: ENTEROSCOPY;  Surgeon: Unk Corinn Skiff, MD;  Location: [facility] ENDOSCOPY;  Service: Gastroenterology;  Laterality: N/A;   ENTEROSCOPY N/A 10/26/2021   Procedure: ENTEROSCOPY;  Surgeon: Unk Corinn Skiff, MD;  Location: [facility] ENDOSCOPY;  Service: Gastroenterology;  Laterality: N/A;  push   ENTEROSCOPY N/A 07/27/2022   Procedure: ENTEROSCOPY;  Surgeon: Jinny Carmine, MD;  Location: [facility] ENDOSCOPY;  Service: Endoscopy;  Laterality: N/A;  Push   ENTEROSCOPY N/A 06/29/2023   Procedure: ENTEROSCOPY;  Surgeon: Therisa Bi, MD;  Location: [facility] ENDOSCOPY;  Service: Gastroenterology;  Laterality: N/A;  Push   ENTEROSCOPY N/A 08/10/2023   Procedure: ENTEROSCOPY;   Surgeon: Unk Corinn Skiff, MD;  Location: [facility] ENDOSCOPY;  Service: Gastroenterology;  Laterality: N/A;  Push   ESOPHAGOGASTRODUODENOSCOPY Left 04/13/2017   Procedure: ESOPHAGOGASTRODUODENOSCOPY (EGD);  Surgeon: Janalyn Keene NOVAK, MD;  Location: [facility] ENDOSCOPY;  Service: Endoscopy;  Laterality: Left;   ESOPHAGOGASTRODUODENOSCOPY (EGD) WITH PROPOFOL  N/A 07/20/2017   Procedure: ESOPHAGOGASTRODUODENOSCOPY (EGD) WITH PROPOFOL ;  Surgeon: Janalyn Keene NOVAK, MD;  Location: [facility] ENDOSCOPY;  Service: Endoscopy;  Laterality: N/A;   ESOPHAGOGASTRODUODENOSCOPY (EGD) WITH PROPOFOL  N/A 12/12/2017   Procedure: ESOPHAGOGASTRODUODENOSCOPY (EGD) WITH PROPOFOL ;  Surgeon: Janalyn Keene NOVAK, MD;  Location: [facility] ENDOSCOPY;  Service: Endoscopy;  Laterality: N/A;   ESOPHAGOGASTRODUODENOSCOPY (EGD) WITH PROPOFOL  N/A 12/23/2020   Procedure: ESOPHAGOGASTRODUODENOSCOPY (EGD) WITH PROPOFOL ;  Surgeon: Janalyn Keene NOVAK, MD;  Location: [facility] ENDOSCOPY;  Service: Endoscopy;  Laterality: N/A;   ESOPHAGOGASTRODUODENOSCOPY (EGD) WITH PROPOFOL  N/A 01/27/2022   Procedure: ESOPHAGOGASTRODUODENOSCOPY (EGD) WITH PROPOFOL ;  Surgeon: Therisa Bi, MD;  Location: [facility] ENDOSCOPY;  Service: Gastroenterology;  Laterality: N/A;   GIVENS CAPSULE STUDY N/A 03/10/2021   Procedure: GIVENS CAPSULE STUDY;  Surgeon: Janalyn Keene NOVAK, MD;  Location: [facility] ENDOSCOPY;  Service: Endoscopy;  Laterality: N/A;   HOT HEMOSTASIS  06/29/2023   Procedure: HOT HEMOSTASIS (ARGON PLASMA COAGULATION/BICAP);  Surgeon: Therisa Bi, MD;  Location: [facility] ENDOSCOPY;  Service: Gastroenterology;;   HOT HEMOSTASIS  08/10/2023   Procedure: EGD, WITH ARGON PLASMA COAGULATION;  Surgeon: Unk Corinn Skiff, MD;  Location: [facility] ENDOSCOPY;  Service: Gastroenterology;;   MEDIAL PARTIAL KNEE REPLACEMENT Left 1990   torn ligament. no knee replacement at that time   PARTIAL HYSTERECTOMY     SHOULDER ARTHROSCOPY WITH OPEN ROTATOR CUFF REPAIR Left 03/01/2016   Procedure:  SHOULDER ARTHROSCOPY WITH OPEN ROTATOR CUFF REPAIR;  Surgeon: Franky Cranker, MD;  Location: [facility] ORS;  Service: Orthopedics;  Laterality: Left;   TOTAL KNEE ARTHROPLASTY Left 02/14/2018   Procedure: TOTAL KNEE ARTHROPLASTY;  Surgeon: Cranker Franky, MD;  Location: [facility] ORS;  Service: Orthopedics;  Laterality: Left;    FAMILY HISTORY: Family History  Problem Relation Age of Onset   CAD Mother    CAD Sister    Breast cancer Neg Hx     ADVANCED DIRECTIVES (Y/N):  N  HEALTH MAINTENANCE: Social History   Tobacco Use   Smoking status: Some Days    Current packs/day: 0.00    Average packs/day: 0.3 packs/day for 50.0 years (12.5 ttl pk-yrs)    Types: Cigarettes    Start date: 03/13/1966    Last attempt to quit: 03/13/2016    Years since quitting: 7.9   Smokeless tobacco: Never  Vaping Use   Vaping status: Never Used  Substance Use Topics   Alcohol use: No   Drug use: No     Colonoscopy:  PAP:  Bone density:  Lipid panel:  Allergies  Allergen Reactions   Latex Rash    Welts over body    Current Outpatient Medications  Medication Sig Dispense Refill   albuterol  (VENTOLIN  HFA) 108 (90 Base) MCG/ACT inhaler Inhale 2 puffs into the lungs every 6 (six) hours as needed for wheezing.     amitriptyline  (ELAVIL ) 25 MG tablet Take 25 mg by mouth at bedtime.     amitriptyline  (ELAVIL ) 50 MG tablet Take  50 mg by mouth at bedtime.     budesonide-formoterol  (SYMBICORT) 160-4.5 MCG/ACT inhaler Inhale 2 puffs into the lungs 2 (two) times daily.     escitalopram (LEXAPRO) 5 MG tablet Take 5 mg by mouth daily.     furosemide  (LASIX ) 20 MG tablet Take 20 mg daily by mouth.      lisinopril  (PRINIVIL ,ZESTRIL ) 20 MG tablet Take 20 mg by mouth daily.     omeprazole  (PRILOSEC ) 40 MG capsule Take 1 capsule (40 mg total) by mouth daily. 90 capsule 3   potassium chloride  (KLOR-CON  M) 10 MEQ tablet Take 10 mEq by mouth daily.     rOPINIRole  (REQUIP ) 2 MG tablet Take 2-4 mg by mouth at bedtime as  needed (restless leg syndrome).     simvastatin  (ZOCOR ) 20 MG tablet Take 20 mg by mouth at bedtime.     SPIRIVA  HANDIHALER 18 MCG inhalation capsule Place 18 mcg into inhaler and inhale daily.     SUMAtriptan  6 MG/0.5ML SOAJ Inject into the skin.     vitamin B-12 (CYANOCOBALAMIN ) 1000 MCG tablet Take 1,000 mcg by mouth daily.     No current facility-administered medications for this visit.    OBJECTIVE: Vitals:   02/06/24 0917  BP: (!) 146/75  Pulse: 89  Resp: 16  Temp: (!) 96.6 F (35.9 C)  SpO2: 99%      Body mass index is 26.75 kg/m.    ECOG FS:1 - Symptomatic but completely ambulatory  General: Well-developed, well-nourished, no acute distress. Eyes: Pink conjunctiva, anicteric sclera. HEENT: Normocephalic, moist mucous membranes. Lungs: No audible wheezing or coughing. Heart: Regular rate and rhythm. Abdomen: Soft, nontender, no obvious distention. Musculoskeletal: No edema, cyanosis, or clubbing. Neuro: Alert, answering all questions appropriately. Cranial nerves grossly intact. Skin: No rashes or petechiae noted. Psych: Normal affect.  LAB RESULTS:  Lab Results  Component Value Date   NA 144 08/10/2023   K 3.7 08/10/2023   CL 109 08/10/2023   CO2 29 08/10/2023   GLUCOSE 104 (H) 08/10/2023   BUN 17 08/10/2023   CREATININE 0.68 08/10/2023   CALCIUM 8.2 (L) 08/10/2023   PROT 5.7 (L) 06/13/2023   ALBUMIN 3.3 (L) 06/13/2023   AST 17 06/13/2023   ALT 13 06/13/2023   ALKPHOS 28 (L) 06/13/2023   BILITOT 0.4 06/13/2023   GFRNONAA >60 08/10/2023   GFRAA >60 02/16/2018    Lab Results  Component Value Date   WBC 4.9 02/06/2024   NEUTROABS 3.5 02/06/2024   HGB 8.4 (L) 02/06/2024   HCT 27.6 (L) 02/06/2024   MCV 79.5 (L) 02/06/2024   PLT 283 02/06/2024   Lab Results  Component Value Date   IRON  26 (L) 12/19/2023   TIBC 363 12/19/2023   IRONPCTSAT 7 (L) 12/19/2023   Lab Results  Component Value Date   FERRITIN 7 (L) 12/19/2023     STUDIES: No  results found.    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  Patient's hemoglobin has improved to 8.4 with transfusion and she does not require additional blood this week.  Patient previously received octreotide  every 2 weeks for 4 months with no appreciable change in her transfusion requirement.  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  The dose of thalidomide is 100 mg daily for a total of 4 months.  Unlikely to use thalidomide in this patient given its toxicity and unclear efficacy.  Return to clinic in 2 weeks for repeat laboratory work, octreotide , further evaluation, and consideration of additional blood.   Thrombocytosis: Resolved. History of AVMs: EGD and octreotide  as above.  Patient thinks her next GI appointment is later this month.   Tendon repair: Improved. Coping/adjustment: Patient was previously given a referral to [facility].   Patient expressed understanding and was in agreement with this plan. She also understands that She can call clinic at any time with any questions, concerns, or complaints.    Evalene JINNY Reusing, MD   02/06/2024 12:26 PM

## 2024-02-07 ENCOUNTER — Inpatient Hospital Stay

## 2024-02-20 ENCOUNTER — Other Ambulatory Visit: Payer: Self-pay | Admitting: *Deleted

## 2024-02-20 ENCOUNTER — Encounter: Payer: Self-pay | Admitting: Oncology

## 2024-02-20 ENCOUNTER — Inpatient Hospital Stay (HOSPITAL_BASED_OUTPATIENT_CLINIC_OR_DEPARTMENT_OTHER): Admitting: Oncology

## 2024-02-20 ENCOUNTER — Inpatient Hospital Stay

## 2024-02-20 VITALS — BP 123/73 | HR 85 | Temp 97.6°F | Resp 16 | Wt 150.0 lb

## 2024-02-20 DIAGNOSIS — D509 Iron deficiency anemia, unspecified: Secondary | ICD-10-CM

## 2024-02-20 DIAGNOSIS — D500 Iron deficiency anemia secondary to blood loss (chronic): Secondary | ICD-10-CM | POA: Diagnosis not present

## 2024-02-20 LAB — CBC WITH DIFFERENTIAL/PLATELET
Abs Immature Granulocytes: 0.02 K/uL (ref 0.00–0.07)
Basophils Absolute: 0 K/uL (ref 0.0–0.1)
Basophils Relative: 1 %
Eosinophils Absolute: 0.1 K/uL (ref 0.0–0.5)
Eosinophils Relative: 1 %
HCT: 22.7 % — ABNORMAL LOW (ref 36.0–46.0)
Hemoglobin: 6.9 g/dL — CL (ref 12.0–15.0)
Immature Granulocytes: 0 %
Lymphocytes Relative: 18 %
Lymphs Abs: 0.8 K/uL (ref 0.7–4.0)
MCH: 23.4 pg — ABNORMAL LOW (ref 26.0–34.0)
MCHC: 30.4 g/dL (ref 30.0–36.0)
MCV: 76.9 fL — ABNORMAL LOW (ref 80.0–100.0)
Monocytes Absolute: 0.5 K/uL (ref 0.1–1.0)
Monocytes Relative: 10 %
Neutro Abs: 3.2 K/uL (ref 1.7–7.7)
Neutrophils Relative %: 70 %
Platelets: 346 K/uL (ref 150–400)
RBC: 2.95 MIL/uL — ABNORMAL LOW (ref 3.87–5.11)
RDW: 18.9 % — ABNORMAL HIGH (ref 11.5–15.5)
WBC: 4.6 K/uL (ref 4.0–10.5)
nRBC: 0 % (ref 0.0–0.2)

## 2024-02-20 LAB — PREPARE RBC (CROSSMATCH)

## 2024-02-20 MED ORDER — OCTREOTIDE ACETATE 30 MG IM KIT
30.0000 mg | PACK | Freq: Once | INTRAMUSCULAR | Status: AC
  Administered 2024-02-20: 30 mg via INTRAMUSCULAR
  Filled 2024-02-20: qty 1

## 2024-02-20 NOTE — Progress Notes (Signed)
 [facility]  Telephone:(336) [phone] Fax:(336) [phone]  ID: Hailey Farrell OB: 03/03/1951  MR#: [id number]  RDW#:[id number]  Patient Care Team: Kandis Stefano Iles, MD as PCP - General (Family Medicine) Jacobo Evalene PARAS, MD as Consulting Physician (Oncology)  CHIEF COMPLAINT: Anemia secondary to AVM/GI bleed.  INTERVAL HISTORY: Patient returns to clinic today for repeat laboratory work, further evaluation, and consideration of octreotide  and possible blood transfusion.  She has noticed increased fatigue over the past week, but otherwise has felt well.  She continues to deny any melena or hematochezia. She had no neurologic complaints.  She denies any recent fevers or illnesses.  She has a fair appetite.  She denies any chest pain, shortness of breath, cough, or hemoptysis.  She denies any nausea, vomiting, constipation, or diarrhea.  She has no urinary complaints.  Patient offers no further specific complaints today.  REVIEW OF SYSTEMS:   Review of Systems  Constitutional:  Positive for malaise/fatigue. Negative for fever and weight loss.  Respiratory: Negative.  Negative for cough and shortness of breath.   Cardiovascular: Negative.  Negative for chest pain and leg swelling.  Gastrointestinal: Negative.  Negative for abdominal pain, blood in stool and melena.  Genitourinary: Negative.  Negative for hematuria.  Musculoskeletal: Negative.  Negative for back pain.  Skin: Negative.  Negative for rash.  Neurological: Negative.  Negative for dizziness, focal weakness, weakness and headaches.  Psychiatric/Behavioral: Negative.  The patient is not nervous/anxious.     As per HPI. Otherwise, a complete review of systems is negative.  PAST MEDICAL HISTORY: Past Medical History:  Diagnosis Date   Anemia    Arthritis    Breast cancer (HCC) 1990   Right   COPD (chronic obstructive pulmonary disease) (HCC)    Dyspnea    DOE   Elevated lipids    Hypertension     Lower extremity edema    Migraines    Migraines    Personal history of radiation therapy 1990   Breast   Restless leg syndrome    Rotator cuff injury     PAST SURGICAL HISTORY: Past Surgical History:  Procedure Laterality Date   ABDOMINAL HYSTERECTOMY     BACK SURGERY  08/2017   CERVICAL FUSION   BREAST BIOPSY Right 1990   LUMPECTOMY.  took lymph nodes   BREAST LUMPECTOMY     COLONOSCOPY WITH PROPOFOL  N/A 07/20/2017   Procedure: COLONOSCOPY WITH PROPOFOL ;  Surgeon: Janalyn Keene NOVAK, MD;  Location: [facility] ENDOSCOPY;  Service: Endoscopy;  Laterality: N/A;   COLONOSCOPY WITH PROPOFOL  N/A 01/27/2022   Procedure: COLONOSCOPY WITH PROPOFOL ;  Surgeon: Therisa Bi, MD;  Location: [facility] ENDOSCOPY;  Service: Gastroenterology;  Laterality: N/A;   ENTEROSCOPY N/A 08/08/2017   Procedure: ENTEROSCOPY;  Surgeon: Unk Corinn Skiff, MD;  Location: [facility] ENDOSCOPY;  Service: Gastroenterology;  Laterality: N/A;   ENTEROSCOPY N/A 05/07/2020   Procedure: ENTEROSCOPY with Adult colonoscope;  Surgeon: Janalyn Keene NOVAK, MD;  Location: [facility] ENDOSCOPY;  Service: Endoscopy;  Laterality: N/A;   ENTEROSCOPY N/A 03/17/2021   Procedure: ENTEROSCOPY;  Surgeon: Janalyn Keene NOVAK, MD;  Location: [facility] ENDOSCOPY;  Service: Endoscopy;  Laterality: N/A;  PUSH   ENTEROSCOPY N/A 07/13/2021   Procedure: ENTEROSCOPY;  Surgeon: Unk Corinn Skiff, MD;  Location: [facility] ENDOSCOPY;  Service: Gastroenterology;  Laterality: N/A;   ENTEROSCOPY N/A 10/26/2021   Procedure: ENTEROSCOPY;  Surgeon: Unk Corinn Skiff, MD;  Location: [facility] ENDOSCOPY;  Service: Gastroenterology;  Laterality: N/A;  push   ENTEROSCOPY N/A 07/27/2022  Procedure: ENTEROSCOPY;  Surgeon: Jinny Carmine, MD;  Location: [facility] ENDOSCOPY;  Service: Endoscopy;  Laterality: N/A;  Push   ENTEROSCOPY N/A 06/29/2023   Procedure: ENTEROSCOPY;  Surgeon: Therisa Bi, MD;  Location: [facility] ENDOSCOPY;  Service: Gastroenterology;  Laterality: N/A;  Push   ENTEROSCOPY N/A  08/10/2023   Procedure: ENTEROSCOPY;  Surgeon: Unk Corinn Skiff, MD;  Location: [facility] ENDOSCOPY;  Service: Gastroenterology;  Laterality: N/A;  Push   ESOPHAGOGASTRODUODENOSCOPY Left 04/13/2017   Procedure: ESOPHAGOGASTRODUODENOSCOPY (EGD);  Surgeon: Janalyn Keene NOVAK, MD;  Location: [facility] ENDOSCOPY;  Service: Endoscopy;  Laterality: Left;   ESOPHAGOGASTRODUODENOSCOPY (EGD) WITH PROPOFOL  N/A 07/20/2017   Procedure: ESOPHAGOGASTRODUODENOSCOPY (EGD) WITH PROPOFOL ;  Surgeon: Janalyn Keene NOVAK, MD;  Location: [facility] ENDOSCOPY;  Service: Endoscopy;  Laterality: N/A;   ESOPHAGOGASTRODUODENOSCOPY (EGD) WITH PROPOFOL  N/A 12/12/2017   Procedure: ESOPHAGOGASTRODUODENOSCOPY (EGD) WITH PROPOFOL ;  Surgeon: Janalyn Keene NOVAK, MD;  Location: [facility] ENDOSCOPY;  Service: Endoscopy;  Laterality: N/A;   ESOPHAGOGASTRODUODENOSCOPY (EGD) WITH PROPOFOL  N/A 12/23/2020   Procedure: ESOPHAGOGASTRODUODENOSCOPY (EGD) WITH PROPOFOL ;  Surgeon: Janalyn Keene NOVAK, MD;  Location: [facility] ENDOSCOPY;  Service: Endoscopy;  Laterality: N/A;   ESOPHAGOGASTRODUODENOSCOPY (EGD) WITH PROPOFOL  N/A 01/27/2022   Procedure: ESOPHAGOGASTRODUODENOSCOPY (EGD) WITH PROPOFOL ;  Surgeon: Therisa Bi, MD;  Location: [facility] ENDOSCOPY;  Service: Gastroenterology;  Laterality: N/A;   GIVENS CAPSULE STUDY N/A 03/10/2021   Procedure: GIVENS CAPSULE STUDY;  Surgeon: Janalyn Keene NOVAK, MD;  Location: [facility] ENDOSCOPY;  Service: Endoscopy;  Laterality: N/A;   HOT HEMOSTASIS  06/29/2023   Procedure: HOT HEMOSTASIS (ARGON PLASMA COAGULATION/BICAP);  Surgeon: Therisa Bi, MD;  Location: [facility] ENDOSCOPY;  Service: Gastroenterology;;   HOT HEMOSTASIS  08/10/2023   Procedure: EGD, WITH ARGON PLASMA COAGULATION;  Surgeon: Unk Corinn Skiff, MD;  Location: [facility] ENDOSCOPY;  Service: Gastroenterology;;   MEDIAL PARTIAL KNEE REPLACEMENT Left 1990   torn ligament. no knee replacement at that time   PARTIAL HYSTERECTOMY     SHOULDER ARTHROSCOPY WITH OPEN ROTATOR CUFF  REPAIR Left 03/01/2016   Procedure: SHOULDER ARTHROSCOPY WITH OPEN ROTATOR CUFF REPAIR;  Surgeon: Franky Cranker, MD;  Location: [facility] ORS;  Service: Orthopedics;  Laterality: Left;   TOTAL KNEE ARTHROPLASTY Left 02/14/2018   Procedure: TOTAL KNEE ARTHROPLASTY;  Surgeon: Cranker Franky, MD;  Location: [facility] ORS;  Service: Orthopedics;  Laterality: Left;    FAMILY HISTORY: Family History  Problem Relation Age of Onset   CAD Mother    CAD Sister    Breast cancer Neg Hx     ADVANCED DIRECTIVES (Y/N):  N  HEALTH MAINTENANCE: Social History   Tobacco Use   Smoking status: Some Days    Current packs/day: 0.00    Average packs/day: 0.3 packs/day for 50.0 years (12.5 ttl pk-yrs)    Types: Cigarettes    Start date: 03/13/1966    Last attempt to quit: 03/13/2016    Years since quitting: 7.9   Smokeless tobacco: Never  Vaping Use   Vaping status: Never Used  Substance Use Topics   Alcohol use: No   Drug use: No     Colonoscopy:  PAP:  Bone density:  Lipid panel:  Allergies  Allergen Reactions   Latex Rash    Welts over body    Current Outpatient Medications  Medication Sig Dispense Refill   albuterol  (VENTOLIN  HFA) 108 (90 Base) MCG/ACT inhaler Inhale 2 puffs into the lungs every 6 (six) hours as needed for wheezing.     amitriptyline  (ELAVIL ) 25 MG tablet Take 25 mg by mouth at bedtime.  amitriptyline  (ELAVIL ) 50 MG tablet Take 50 mg by mouth at bedtime.     budesonide-formoterol  (SYMBICORT) 160-4.5 MCG/ACT inhaler Inhale 2 puffs into the lungs 2 (two) times daily.     escitalopram (LEXAPRO) 5 MG tablet Take 5 mg by mouth daily.     furosemide  (LASIX ) 20 MG tablet Take 20 mg daily by mouth.      lisinopril  (PRINIVIL ,ZESTRIL ) 20 MG tablet Take 20 mg by mouth daily.     omeprazole  (PRILOSEC ) 40 MG capsule Take 1 capsule (40 mg total) by mouth daily. 90 capsule 3   potassium chloride  (KLOR-CON  M) 10 MEQ tablet Take 10 mEq by mouth daily.     rOPINIRole  (REQUIP ) 2 MG  tablet Take 2-4 mg by mouth at bedtime as needed (restless leg syndrome).     simvastatin  (ZOCOR ) 20 MG tablet Take 20 mg by mouth at bedtime.     SPIRIVA  HANDIHALER 18 MCG inhalation capsule Place 18 mcg into inhaler and inhale daily.     SUMAtriptan  6 MG/0.5ML SOAJ Inject into the skin.     vitamin B-12 (CYANOCOBALAMIN ) 1000 MCG tablet Take 1,000 mcg by mouth daily.     No current facility-administered medications for this visit.    OBJECTIVE: Vitals:   02/20/24 1113  BP: 123/73  Pulse: 85  Resp: 16  Temp: 97.6 F (36.4 C)  SpO2: 100%      Body mass index is 26.57 kg/m.    ECOG FS:1 - Symptomatic but completely ambulatory  General: Well-developed, well-nourished, no acute distress. Eyes: Pink conjunctiva, anicteric sclera. HEENT: Normocephalic, moist mucous membranes. Lungs: No audible wheezing or coughing. Heart: Regular rate and rhythm. Abdomen: Soft, nontender, no obvious distention. Musculoskeletal: No edema, cyanosis, or clubbing. Neuro: Alert, answering all questions appropriately. Cranial nerves grossly intact. Skin: No rashes or petechiae noted. Psych: Normal affect.  LAB RESULTS:  Lab Results  Component Value Date   NA 144 08/10/2023   K 3.7 08/10/2023   CL 109 08/10/2023   CO2 29 08/10/2023   GLUCOSE 104 (H) 08/10/2023   BUN 17 08/10/2023   CREATININE 0.68 08/10/2023   CALCIUM 8.2 (L) 08/10/2023   PROT 5.7 (L) 06/13/2023   ALBUMIN 3.3 (L) 06/13/2023   AST 17 06/13/2023   ALT 13 06/13/2023   ALKPHOS 28 (L) 06/13/2023   BILITOT 0.4 06/13/2023   GFRNONAA >60 08/10/2023   GFRAA >60 02/16/2018    Lab Results  Component Value Date   WBC 4.6 02/20/2024   NEUTROABS 3.2 02/20/2024   HGB 6.9 (LL) 02/20/2024   HCT 22.7 (L) 02/20/2024   MCV 76.9 (L) 02/20/2024   PLT 346 02/20/2024   Lab Results  Component Value Date   IRON  26 (L) 12/19/2023   TIBC 363 12/19/2023   IRONPCTSAT 7 (L) 12/19/2023   Lab Results  Component Value Date   FERRITIN 7 (L)  12/19/2023     STUDIES: No results found.    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  Patient hemoglobin is trended down to 6.9 and she is symptomatic.  It appears patient only requires transfusion approximately every 4 weeks.  Proceed with octreotide  today.  Return to clinic tomorrow for 1 unit of packed red blood cells.  Patient will then return to clinic in 4 weeks for further evaluation and continuation of treatment.  History of AVMs: EGD and octreotide  as above.  Patient reports she has an appointment GI next week.   Tendon repair: Improved. Coping/adjustment: Patient was previously given a referral to [facility]  I spent a total of 30 minutes reviewing chart data, face-to-face evaluation with the patient, counseling and coordination of care as detailed above.    Patient expressed understanding and was in agreement with this plan. She also understands that She can call clinic at any time with any questions, concerns, or complaints.    Evalene JINNY Reusing, MD   02/20/2024 12:37 PM

## 2024-02-21 ENCOUNTER — Inpatient Hospital Stay

## 2024-02-21 DIAGNOSIS — D509 Iron deficiency anemia, unspecified: Secondary | ICD-10-CM

## 2024-02-21 DIAGNOSIS — D500 Iron deficiency anemia secondary to blood loss (chronic): Secondary | ICD-10-CM | POA: Diagnosis not present

## 2024-02-21 MED ORDER — SODIUM CHLORIDE 0.9% FLUSH
3.0000 mL | INTRAVENOUS | Status: DC | PRN
  Filled 2024-02-21: qty 3

## 2024-02-21 MED ORDER — DIPHENHYDRAMINE HCL 50 MG/ML IJ SOLN
25.0000 mg | Freq: Once | INTRAMUSCULAR | Status: AC
  Administered 2024-02-21: 25 mg via INTRAVENOUS
  Filled 2024-02-21: qty 1

## 2024-02-21 MED ORDER — ACETAMINOPHEN 325 MG PO TABS
650.0000 mg | ORAL_TABLET | Freq: Once | ORAL | Status: AC
  Administered 2024-02-21: 650 mg via ORAL
  Filled 2024-02-21: qty 2

## 2024-02-21 NOTE — Patient Instructions (Signed)
 Blood Transfusion, Adult A blood transfusion is a procedure in which you receive blood through an IV tube. You may need this procedure because of: A bleeding disorder. An illness. An injury. A surgery. The blood may come from someone else (a donor). You may also be able to donate blood for yourself before a surgery. The blood given in a transfusion may be made up of different types of cells. You may get: Red blood cells. These carry oxygen to the cells in the body. Platelets. These help your blood to clot. Plasma. This is the liquid part of your blood. It carries proteins and other substances through the body. White blood cells. These help you fight infections. If you have a clotting disorder, you may also get other types of blood products. Depending on the type of blood product, this procedure may take 1-4 hours to complete. Tell your doctor about: Any bleeding problems you have. Any reactions you have had during a blood transfusion in the past. Any allergies you have. All medicines you are taking, including vitamins, herbs, eye drops, creams, and over-the-counter medicines. Any surgeries you have had. Any medical conditions you have. Whether you are pregnant or may be pregnant. What are the risks? Talk with your health care provider about risks. The most common problems include: A mild allergic reaction. This includes red, swollen areas of skin (hives) and itching. Fever or chills. This may be the body's response to new blood cells received. This may happen during or up to 4 hours after the transfusion. More serious problems may include: A serious allergic reaction. This includes breathing trouble or swelling around the face and lips. Too much fluid in the lungs. This may cause breathing problems. Lung injury. This causes breathing trouble and low oxygen in the blood. This can happen within hours of the transfusion or days later. Too much iron . This can happen after getting many blood  transfusions over a period of time. An infection or virus passed through the blood. This is rare. Donated blood is carefully tested before it is given. Your body's defense system (immune system) trying to attack the new blood cells. This is rare. Symptoms may include fever, chills, nausea, low blood pressure, and low back or chest pain. Donated cells attacking healthy tissues. This is rare. What happens before the procedure? You will have a blood test to find out your blood type. The test also finds out what type of blood your body will accept and matches it to the donor type. If you are going to have a planned surgery, you may be able to donate your own blood. This may be done in case you need a transfusion. You will have your temperature, blood pressure, and pulse checked. You may receive medicine to help prevent an allergic reaction. This may be done if you have had a reaction to a transfusion before. This medicine may be given to you by mouth or through an IV tube. What happens during the procedure?  An IV tube will be put into one of your veins. The bag of blood will be attached to your IV tube. Then, the blood will enter through your vein. Your temperature, blood pressure, and pulse will be checked often. This is done to find early signs of a transfusion reaction. Tell your nurse right away if you have any of these symptoms: Shortness of breath or trouble breathing. Chest or back pain. Fever or chills. Red, swollen areas of skin or itching. If you have any signs  or symptoms of a reaction, your transfusion will be stopped. You may also be given medicine. When the transfusion is finished, your IV tube will be taken out. Pressure may be put on the IV site for a few minutes. A bandage (dressing) will be put on the IV site. The procedure may vary among doctors and hospitals. What happens after the procedure? You will be monitored until you leave the hospital or clinic. This includes  checking your temperature, blood pressure, pulse, breathing rate, and blood oxygen level. Your blood may be tested to see how you have responded to the transfusion. You may be warmed with fluids or blankets. This is done to keep the temperature of your body normal. If you have your procedure in an outpatient setting, you will be told whom to contact to report any reactions. Where to find more information Visit the [employer]: redcross.org Summary A blood transfusion is a procedure in which you receive blood through an IV tube. The blood you are given may be made up of different blood cells. You may receive red blood cells, platelets, plasma, or white blood cells. Your temperature, blood pressure, and pulse will be checked often. After the procedure, your blood may be tested to see how you have responded. This information is not intended to replace advice given to you by your health care provider. Make sure you discuss any questions you have with your health care provider. Document Revised: 08/19/2021 Document Reviewed: 08/19/2021 Elsevier Patient Education  2024 [employer].

## 2024-02-22 LAB — BPAM RBC
Blood Product Expiration Date: 202510182359
ISSUE DATE / TIME: 202509181013
Unit Type and Rh: 202510182359
Unit Type and Rh: 5100

## 2024-02-22 LAB — TYPE AND SCREEN
ABO/RH(D): O POS
Antibody Screen: NEGATIVE
Unit division: 0

## 2024-03-05 ENCOUNTER — Telehealth: Payer: Self-pay | Admitting: Oncology

## 2024-03-05 NOTE — Telephone Encounter (Signed)
 I called the pt, no answer so I left her a vm regarding her appt new time due to the provider having a meeting.

## 2024-03-19 ENCOUNTER — Ambulatory Visit: Admitting: Oncology

## 2024-03-19 ENCOUNTER — Other Ambulatory Visit: Payer: Self-pay | Admitting: *Deleted

## 2024-03-19 ENCOUNTER — Inpatient Hospital Stay (HOSPITAL_BASED_OUTPATIENT_CLINIC_OR_DEPARTMENT_OTHER): Admitting: Oncology

## 2024-03-19 ENCOUNTER — Inpatient Hospital Stay: Attending: Oncology

## 2024-03-19 ENCOUNTER — Inpatient Hospital Stay

## 2024-03-19 ENCOUNTER — Other Ambulatory Visit

## 2024-03-19 ENCOUNTER — Encounter: Payer: Self-pay | Admitting: Oncology

## 2024-03-19 VITALS — BP 121/70 | HR 63 | Temp 97.6°F | Resp 18 | Ht 63.0 in | Wt 149.0 lb

## 2024-03-19 DIAGNOSIS — D509 Iron deficiency anemia, unspecified: Secondary | ICD-10-CM

## 2024-03-19 DIAGNOSIS — D649 Anemia, unspecified: Secondary | ICD-10-CM | POA: Diagnosis not present

## 2024-03-19 DIAGNOSIS — Z79899 Other long term (current) drug therapy: Secondary | ICD-10-CM | POA: Diagnosis not present

## 2024-03-19 DIAGNOSIS — D500 Iron deficiency anemia secondary to blood loss (chronic): Secondary | ICD-10-CM | POA: Diagnosis present

## 2024-03-19 DIAGNOSIS — Q2730 Arteriovenous malformation, site unspecified: Secondary | ICD-10-CM | POA: Diagnosis not present

## 2024-03-19 DIAGNOSIS — K922 Gastrointestinal hemorrhage, unspecified: Secondary | ICD-10-CM | POA: Diagnosis not present

## 2024-03-19 LAB — CBC WITH DIFFERENTIAL/PLATELET
Abs Immature Granulocytes: 0 K/uL (ref 0.00–0.07)
Basophils Absolute: 0 K/uL (ref 0.0–0.1)
Basophils Relative: 1 %
Eosinophils Absolute: 0 K/uL (ref 0.0–0.5)
Eosinophils Relative: 1 %
HCT: 23.5 % — ABNORMAL LOW (ref 36.0–46.0)
Hemoglobin: 7.2 g/dL — ABNORMAL LOW (ref 12.0–15.0)
Immature Granulocytes: 0 %
Lymphocytes Relative: 27 %
Lymphs Abs: 1 K/uL (ref 0.7–4.0)
MCH: 21.9 pg — ABNORMAL LOW (ref 26.0–34.0)
MCHC: 30.6 g/dL (ref 30.0–36.0)
MCV: 71.4 fL — ABNORMAL LOW (ref 80.0–100.0)
Monocytes Absolute: 0.4 K/uL (ref 0.1–1.0)
Monocytes Relative: 11 %
Neutro Abs: 2.3 K/uL (ref 1.7–7.7)
Neutrophils Relative %: 60 %
Platelets: 419 K/uL — ABNORMAL HIGH (ref 150–400)
RBC: 3.29 MIL/uL — ABNORMAL LOW (ref 3.87–5.11)
RDW: 20.6 % — ABNORMAL HIGH (ref 11.5–15.5)
WBC: 3.7 K/uL — ABNORMAL LOW (ref 4.0–10.5)
nRBC: 0 % (ref 0.0–0.2)

## 2024-03-19 LAB — PREPARE RBC (CROSSMATCH)

## 2024-03-19 MED ORDER — OCTREOTIDE ACETATE 30 MG IM KIT
30.0000 mg | PACK | Freq: Once | INTRAMUSCULAR | Status: AC
  Administered 2024-03-19: 30 mg via INTRAMUSCULAR
  Filled 2024-03-19: qty 1

## 2024-03-19 NOTE — Progress Notes (Unsigned)
 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  INTERVAL HISTORY: Patient returns to clinic today for repeat laboratory, further evaluation, and continuation of octreotide  and blood transfusion.  She has chronic fatigue, but otherwise feels well.  She continues to deny any melena or hematochezia. She had no neurologic complaints.  She denies any recent fevers or illnesses.  She has a fair appetite.  She denies any chest pain, shortness of breath, cough, or hemoptysis.  She denies any nausea, vomiting, constipation, or diarrhea.  She has no urinary complaints.  Patient offers no further specific complaints today.  REVIEW OF SYSTEMS:   Review of Systems  Constitutional:  Positive for malaise/fatigue. Negative for fever and weight loss.  Respiratory: Negative.  Negative for cough and shortness of breath.   Cardiovascular: Negative.  Negative for chest pain and leg swelling.  Gastrointestinal: Negative.  Negative for abdominal pain, blood in stool and melena.  Genitourinary: Negative.  Negative for hematuria.  Musculoskeletal: Negative.  Negative for back pain.  Skin: Negative.  Negative for rash.  Neurological: Negative.  Negative for dizziness, focal weakness, weakness and headaches.  Psychiatric/Behavioral: Negative.  The patient is not nervous/anxious.     As per HPI. Otherwise, a complete review of systems is negative.  PAST MEDICAL HISTORY: Past Medical History:  Diagnosis Date   Anemia    Arthritis    Breast cancer (HCC) 1990   Right   COPD (chronic obstructive pulmonary disease) (HCC)    Dyspnea    DOE   Elevated lipids    Hypertension    Lower extremity edema    Migraines     Migraines    Personal history of radiation therapy 1990   Breast   Restless leg syndrome    Rotator cuff injury     PAST SURGICAL HISTORY: Past Surgical History:  Procedure Laterality Date   ABDOMINAL HYSTERECTOMY     BACK SURGERY  08/2017   CERVICAL FUSION   BREAST BIOPSY Right 1990   LUMPECTOMY.  took lymph nodes   BREAST LUMPECTOMY     COLONOSCOPY WITH PROPOFOL  N/A 07/20/2017   Procedure: COLONOSCOPY WITH PROPOFOL ;  Surgeon: Janalyn Keene NOVAK, MD;  Location: [facility] ENDOSCOPY;  Service: Endoscopy;  Laterality: N/A;   COLONOSCOPY WITH PROPOFOL  N/A 01/27/2022   Procedure: COLONOSCOPY WITH PROPOFOL ;  Surgeon: Therisa Bi, MD;  Location: [facility] ENDOSCOPY;  Service: Gastroenterology;  Laterality: N/A;   ENTEROSCOPY N/A 08/08/2017   Procedure: ENTEROSCOPY;  Surgeon: Unk Corinn Skiff, MD;  Location: [facility] ENDOSCOPY;  Service: Gastroenterology;  Laterality: N/A;   ENTEROSCOPY N/A 05/07/2020   Procedure: ENTEROSCOPY with Adult colonoscope;  Surgeon: Janalyn Keene NOVAK, MD;  Location: [facility] ENDOSCOPY;  Service: Endoscopy;  Laterality: N/A;   ENTEROSCOPY N/A 03/17/2021   Procedure: ENTEROSCOPY;  Surgeon: Janalyn Keene NOVAK, MD;  Location: [facility] ENDOSCOPY;  Service: Endoscopy;  Laterality: N/A;  PUSH   ENTEROSCOPY N/A 07/13/2021   Procedure: ENTEROSCOPY;  Surgeon: Unk Corinn Skiff, MD;  Location: [facility] ENDOSCOPY;  Service: Gastroenterology;  Laterality: N/A;   ENTEROSCOPY N/A 10/26/2021   Procedure: ENTEROSCOPY;  Surgeon: Unk Corinn Skiff, MD;  Location: [facility] ENDOSCOPY;  Service: Gastroenterology;  Laterality: N/A;  push   ENTEROSCOPY N/A 07/27/2022   Procedure: ENTEROSCOPY;  Surgeon: Jinny Carmine, MD;  Location: [facility] ENDOSCOPY;  Service: Endoscopy;  Laterality: N/A;  Push   ENTEROSCOPY N/A 06/29/2023   Procedure: ENTEROSCOPY;  Surgeon: Therisa Bi, MD;  Location: [facility] ENDOSCOPY;  Service: Gastroenterology;  Laterality: N/A;  Push   ENTEROSCOPY N/A 08/10/2023   Procedure: ENTEROSCOPY;   Surgeon: Unk Corinn Skiff, MD;  Location: [facility] ENDOSCOPY;  Service: Gastroenterology;  Laterality: N/A;  Push   ESOPHAGOGASTRODUODENOSCOPY Left 04/13/2017   Procedure: ESOPHAGOGASTRODUODENOSCOPY (EGD);  Surgeon: Janalyn Keene NOVAK, MD;  Location: [facility] ENDOSCOPY;  Service: Endoscopy;  Laterality: Left;   ESOPHAGOGASTRODUODENOSCOPY (EGD) WITH PROPOFOL  N/A 07/20/2017   Procedure: ESOPHAGOGASTRODUODENOSCOPY (EGD) WITH PROPOFOL ;  Surgeon: Janalyn Keene NOVAK, MD;  Location: [facility] ENDOSCOPY;  Service: Endoscopy;  Laterality: N/A;   ESOPHAGOGASTRODUODENOSCOPY (EGD) WITH PROPOFOL  N/A 12/12/2017   Procedure: ESOPHAGOGASTRODUODENOSCOPY (EGD) WITH PROPOFOL ;  Surgeon: Janalyn Keene NOVAK, MD;  Location: [facility] ENDOSCOPY;  Service: Endoscopy;  Laterality: N/A;   ESOPHAGOGASTRODUODENOSCOPY (EGD) WITH PROPOFOL  N/A 12/23/2020   Procedure: ESOPHAGOGASTRODUODENOSCOPY (EGD) WITH PROPOFOL ;  Surgeon: Janalyn Keene NOVAK, MD;  Location: [facility] ENDOSCOPY;  Service: Endoscopy;  Laterality: N/A;   ESOPHAGOGASTRODUODENOSCOPY (EGD) WITH PROPOFOL  N/A 01/27/2022   Procedure: ESOPHAGOGASTRODUODENOSCOPY (EGD) WITH PROPOFOL ;  Surgeon: Therisa Bi, MD;  Location: [facility] ENDOSCOPY;  Service: Gastroenterology;  Laterality: N/A;   GIVENS CAPSULE STUDY N/A 03/10/2021   Procedure: GIVENS CAPSULE STUDY;  Surgeon: Janalyn Keene NOVAK, MD;  Location: [facility] ENDOSCOPY;  Service: Endoscopy;  Laterality: N/A;   HOT HEMOSTASIS  06/29/2023   Procedure: HOT HEMOSTASIS (ARGON PLASMA COAGULATION/BICAP);  Surgeon: Therisa Bi, MD;  Location: [facility] ENDOSCOPY;  Service: Gastroenterology;;   HOT HEMOSTASIS  08/10/2023   Procedure: EGD, WITH ARGON PLASMA COAGULATION;  Surgeon: Unk Corinn Skiff, MD;  Location: [facility] ENDOSCOPY;  Service: Gastroenterology;;   MEDIAL PARTIAL KNEE REPLACEMENT Left 1990   torn ligament. no knee replacement at that time   PARTIAL HYSTERECTOMY     SHOULDER ARTHROSCOPY WITH OPEN ROTATOR CUFF REPAIR Left 03/01/2016   Procedure:  SHOULDER ARTHROSCOPY WITH OPEN ROTATOR CUFF REPAIR;  Surgeon: Franky Cranker, MD;  Location: [facility] ORS;  Service: Orthopedics;  Laterality: Left;   TOTAL KNEE ARTHROPLASTY Left 02/14/2018   Procedure: TOTAL KNEE ARTHROPLASTY;  Surgeon: Cranker Franky, MD;  Location: [facility] ORS;  Service: Orthopedics;  Laterality: Left;    FAMILY HISTORY: Family History  Problem Relation Age of Onset   CAD Mother    CAD Sister    Breast cancer Neg Hx     ADVANCED DIRECTIVES (Y/N):  N  HEALTH MAINTENANCE: Social History   Tobacco Use   Smoking status: Some Days    Current packs/day: 0.00    Average packs/day: 0.3 packs/day for 50.0 years (12.5 ttl pk-yrs)    Types: Cigarettes    Start date: 03/13/1966    Last attempt to quit: 03/13/2016    Years since quitting: 8.0   Smokeless tobacco: Never  Vaping Use   Vaping status: Never Used  Substance Use Topics   Alcohol use: No   Drug use: No     Colonoscopy:  PAP:  Bone density:  Lipid panel:  Allergies  Allergen Reactions   Latex Rash    Welts over body    Current Outpatient Medications  Medication Sig Dispense Refill   albuterol  (VENTOLIN  HFA) 108 (90 Base) MCG/ACT inhaler Inhale 2 puffs into the lungs every 6 (six) hours as needed for wheezing.     amitriptyline  (ELAVIL ) 25 MG tablet Take 25 mg by mouth at bedtime.     amitriptyline  (ELAVIL ) 50 MG tablet Take  50 mg by mouth at bedtime.     budesonide-formoterol  (SYMBICORT) 160-4.5 MCG/ACT inhaler Inhale 2 puffs into the lungs 2 (two) times daily.     escitalopram (LEXAPRO) 5 MG tablet Take 5 mg by mouth daily.     furosemide  (LASIX ) 20 MG tablet Take 20 mg daily by mouth.      lisinopril  (PRINIVIL ,ZESTRIL ) 20 MG tablet Take 20 mg by mouth daily.     omeprazole  (PRILOSEC ) 40 MG capsule Take 1 capsule (40 mg total) by mouth daily. 90 capsule 3   potassium chloride  (KLOR-CON  M) 10 MEQ tablet Take 10 mEq by mouth daily.     rOPINIRole  (REQUIP ) 2 MG tablet Take 2-4 mg by mouth at bedtime as  needed (restless leg syndrome).     simvastatin  (ZOCOR ) 20 MG tablet Take 20 mg by mouth at bedtime.     SPIRIVA  HANDIHALER 18 MCG inhalation capsule Place 18 mcg into inhaler and inhale daily.     SUMAtriptan  6 MG/0.5ML SOAJ Inject into the skin.     vitamin B-12 (CYANOCOBALAMIN ) 1000 MCG tablet Take 1,000 mcg by mouth daily.     No current facility-administered medications for this visit.    OBJECTIVE: Vitals:   03/19/24 1051  BP: 121/70  Pulse: 63  Resp: 18  Temp: 97.6 F (36.4 C)  SpO2: 97%      Body mass index is 26.39 kg/m.    ECOG FS:1 - Symptomatic but completely ambulatory  General: Well-developed, well-nourished, no acute distress. Eyes: Pink conjunctiva, anicteric sclera. HEENT: Normocephalic, moist mucous membranes. Lungs: No audible wheezing or coughing. Heart: Regular rate and rhythm. Abdomen: Soft, nontender, no obvious distention. Musculoskeletal: No edema, cyanosis, or clubbing. Neuro: Alert, answering all questions appropriately. Cranial nerves grossly intact. Skin: No rashes or petechiae noted. Psych: Normal affect.  LAB RESULTS:  Lab Results  Component Value Date   NA 144 08/10/2023   K 3.7 08/10/2023   CL 109 08/10/2023   CO2 29 08/10/2023   GLUCOSE 104 (H) 08/10/2023   BUN 17 08/10/2023   CREATININE 0.68 08/10/2023   CALCIUM 8.2 (L) 08/10/2023   PROT 5.7 (L) 06/13/2023   ALBUMIN 3.3 (L) 06/13/2023   AST 17 06/13/2023   ALT 13 06/13/2023   ALKPHOS 28 (L) 06/13/2023   BILITOT 0.4 06/13/2023   GFRNONAA >60 08/10/2023   GFRAA >60 02/16/2018    Lab Results  Component Value Date   WBC 3.7 (L) 03/19/2024   NEUTROABS 2.3 03/19/2024   HGB 7.2 (L) 03/19/2024   HCT 23.5 (L) 03/19/2024   MCV 71.4 (L) 03/19/2024   PLT 419 (H) 03/19/2024   Lab Results  Component Value Date   IRON  26 (L) 12/19/2023   TIBC 363 12/19/2023   IRONPCTSAT 7 (L) 12/19/2023   Lab Results  Component Value Date   FERRITIN 7 (L) 12/19/2023     STUDIES: No  results found.    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  Patient's hemoglobin is mildly improved to 7.2.  She continues to only require transfusion approximately every 4 weeks.  Proceed with octreotide  today.  Return to clinic tomorrow for 1 unit of packed red blood cells.  Patient will then return to clinic in 4 weeks for further evaluation and continuation of treatment.    History of AVMs: EGD and octreotide   as above.  Continue follow-up with GI as scheduled.   Tendon repair: Improved.  I spent a total of 30 minutes reviewing chart data, face-to-face evaluation with the patient, counseling and coordination of care as detailed above.   Patient expressed understanding and was in agreement with this plan. She also understands that She can call clinic at any time with any questions, concerns, or complaints.    Evalene JINNY Reusing, MD   03/20/2024 7:36 AM

## 2024-03-20 ENCOUNTER — Encounter: Payer: Self-pay | Admitting: Oncology

## 2024-03-20 ENCOUNTER — Inpatient Hospital Stay

## 2024-03-20 DIAGNOSIS — D649 Anemia, unspecified: Secondary | ICD-10-CM

## 2024-03-20 DIAGNOSIS — D500 Iron deficiency anemia secondary to blood loss (chronic): Secondary | ICD-10-CM | POA: Diagnosis not present

## 2024-03-20 MED ORDER — ACETAMINOPHEN 325 MG PO TABS
650.0000 mg | ORAL_TABLET | Freq: Once | ORAL | Status: AC
  Administered 2024-03-20: 650 mg via ORAL
  Filled 2024-03-20: qty 2

## 2024-03-20 MED ORDER — DIPHENHYDRAMINE HCL 50 MG/ML IJ SOLN
50.0000 mg | Freq: Once | INTRAMUSCULAR | Status: AC
  Administered 2024-03-20: 50 mg via INTRAVENOUS
  Filled 2024-03-20: qty 1

## 2024-03-20 MED ORDER — SODIUM CHLORIDE 0.9% IV SOLUTION
250.0000 mL | INTRAVENOUS | Status: DC
  Administered 2024-03-20: 100 mL via INTRAVENOUS
  Filled 2024-03-20: qty 250

## 2024-03-21 LAB — TYPE AND SCREEN
ABO/RH(D): O POS
Antibody Screen: NEGATIVE
Unit division: 0

## 2024-03-21 LAB — BPAM RBC
Blood Product Expiration Date: 202511172359
ISSUE DATE / TIME: 202510161043
Unit Type and Rh: 5100

## 2024-03-26 NOTE — Progress Notes (Signed)
 Discharge Plan Member has been discharged from [facility]. Please see below Discharge Plan for details. The Member has been provided a copy of their Discharge Plan via the [facility] Care portal. If you have any questions, please contact our Clinical Team at [phone].  Name: Hailey Farrell Date of Birth: 05-28-1951 Discharge Date: 2024-03-26 Discharged Reason: Not Engaged Referring Provider: Dr. Jacobo Psychiatric Medication Recommendation Transition: N/A (no CC med recs made) Referrals: No referrals Discharge Summary: The patient was scheduled for an initial behavioral health evaluation on 10/31/2023, but did not show. We made several outreach attempts to reschedule but were unable to reach the patient.  [place] Care Provider: Kedric Severin - Health Coach

## 2024-04-04 ENCOUNTER — Encounter: Payer: Self-pay | Admitting: Oncology

## 2024-04-16 ENCOUNTER — Encounter: Payer: Self-pay | Admitting: Oncology

## 2024-04-16 ENCOUNTER — Inpatient Hospital Stay

## 2024-04-16 ENCOUNTER — Inpatient Hospital Stay: Attending: Oncology

## 2024-04-16 ENCOUNTER — Inpatient Hospital Stay (HOSPITAL_BASED_OUTPATIENT_CLINIC_OR_DEPARTMENT_OTHER): Admitting: Oncology

## 2024-04-16 VITALS — BP 148/65 | HR 73 | Temp 96.5°F | Resp 18 | Ht 63.0 in | Wt 152.0 lb

## 2024-04-16 DIAGNOSIS — Z79899 Other long term (current) drug therapy: Secondary | ICD-10-CM | POA: Diagnosis not present

## 2024-04-16 DIAGNOSIS — D649 Anemia, unspecified: Secondary | ICD-10-CM

## 2024-04-16 DIAGNOSIS — Q2730 Arteriovenous malformation, site unspecified: Secondary | ICD-10-CM | POA: Diagnosis present

## 2024-04-16 DIAGNOSIS — K922 Gastrointestinal hemorrhage, unspecified: Secondary | ICD-10-CM | POA: Insufficient documentation

## 2024-04-16 DIAGNOSIS — D500 Iron deficiency anemia secondary to blood loss (chronic): Secondary | ICD-10-CM | POA: Insufficient documentation

## 2024-04-16 DIAGNOSIS — D509 Iron deficiency anemia, unspecified: Secondary | ICD-10-CM

## 2024-04-16 LAB — CBC WITH DIFFERENTIAL/PLATELET
Abs Immature Granulocytes: 0.01 K/uL (ref 0.00–0.07)
Basophils Absolute: 0.1 K/uL (ref 0.0–0.1)
Basophils Relative: 1 %
Eosinophils Absolute: 0.1 K/uL (ref 0.0–0.5)
Eosinophils Relative: 1 %
HCT: 28.1 % — ABNORMAL LOW (ref 36.0–46.0)
Hemoglobin: 8.4 g/dL — ABNORMAL LOW (ref 12.0–15.0)
Immature Granulocytes: 0 %
Lymphocytes Relative: 21 %
Lymphs Abs: 1.2 K/uL (ref 0.7–4.0)
MCH: 21.5 pg — ABNORMAL LOW (ref 26.0–34.0)
MCHC: 29.9 g/dL — ABNORMAL LOW (ref 30.0–36.0)
MCV: 71.9 fL — ABNORMAL LOW (ref 80.0–100.0)
Monocytes Absolute: 0.5 K/uL (ref 0.1–1.0)
Monocytes Relative: 9 %
Neutro Abs: 4 K/uL (ref 1.7–7.7)
Neutrophils Relative %: 68 %
Platelets: 426 K/uL — ABNORMAL HIGH (ref 150–400)
RBC: 3.91 MIL/uL (ref 3.87–5.11)
RDW: 23.7 % — ABNORMAL HIGH (ref 11.5–15.5)
WBC: 5.9 K/uL (ref 4.0–10.5)
nRBC: 0 % (ref 0.0–0.2)

## 2024-04-16 LAB — SAMPLE TO BLOOD BANK

## 2024-04-16 MED ORDER — OCTREOTIDE ACETATE 30 MG IM KIT
30.0000 mg | PACK | Freq: Once | INTRAMUSCULAR | Status: AC
  Administered 2024-04-16: 30 mg via INTRAMUSCULAR
  Filled 2024-04-16: qty 1

## 2024-04-16 NOTE — Progress Notes (Signed)
 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  INTERVAL HISTORY: Patient returns to clinic today for repeat laboratory, further evaluation, and continuation of octreotide  and blood transfusions.  She continues to have fatigue, but otherwise feels well. She continues to deny any melena or hematochezia. She had no neurologic complaints.  She denies any recent fevers or illnesses.  She has a fair appetite.  She denies any chest pain, shortness of breath, cough, or hemoptysis.  She denies any nausea, vomiting, constipation, or diarrhea.  She has no urinary complaints.  Patient offers no further specific complaints today.  REVIEW OF SYSTEMS:   Review of Systems  Constitutional:  Positive for malaise/fatigue. Negative for fever and weight loss.  Respiratory: Negative.  Negative for cough and shortness of breath.   Cardiovascular: Negative.  Negative for chest pain and leg swelling.  Gastrointestinal: Negative.  Negative for abdominal pain, blood in stool and melena.  Genitourinary: Negative.  Negative for hematuria.  Musculoskeletal: Negative.  Negative for back pain.  Skin: Negative.  Negative for rash.  Neurological: Negative.  Negative for dizziness, focal weakness, weakness and headaches.  Psychiatric/Behavioral: Negative.  The patient is not nervous/anxious.     As per HPI. Otherwise, a complete review of systems is negative.  PAST MEDICAL HISTORY: Past Medical History:  Diagnosis Date   Anemia    Arthritis    Breast cancer (HCC) 1990   Right   COPD (chronic obstructive pulmonary disease) (HCC)    Dyspnea    DOE   Elevated lipids    Hypertension    Lower extremity edema    Migraines     Migraines    Personal history of radiation therapy 1990   Breast   Restless leg syndrome    Rotator cuff injury     PAST SURGICAL HISTORY: Past Surgical History:  Procedure Laterality Date   ABDOMINAL HYSTERECTOMY     BACK SURGERY  08/2017   CERVICAL FUSION   BREAST BIOPSY Right 1990   LUMPECTOMY.  took lymph nodes   BREAST LUMPECTOMY     COLONOSCOPY WITH PROPOFOL  N/A 07/20/2017   Procedure: COLONOSCOPY WITH PROPOFOL ;  Surgeon: Janalyn Keene NOVAK, MD;  Location: [facility] ENDOSCOPY;  Service: Endoscopy;  Laterality: N/A;   COLONOSCOPY WITH PROPOFOL  N/A 01/27/2022   Procedure: COLONOSCOPY WITH PROPOFOL ;  Surgeon: Therisa Bi, MD;  Location: [facility] ENDOSCOPY;  Service: Gastroenterology;  Laterality: N/A;   ENTEROSCOPY N/A 08/08/2017   Procedure: ENTEROSCOPY;  Surgeon: Unk Corinn Skiff, MD;  Location: [facility] ENDOSCOPY;  Service: Gastroenterology;  Laterality: N/A;   ENTEROSCOPY N/A 05/07/2020   Procedure: ENTEROSCOPY with Adult colonoscope;  Surgeon: Janalyn Keene NOVAK, MD;  Location: [facility] ENDOSCOPY;  Service: Endoscopy;  Laterality: N/A;   ENTEROSCOPY N/A 03/17/2021   Procedure: ENTEROSCOPY;  Surgeon: Janalyn Keene NOVAK, MD;  Location: [facility] ENDOSCOPY;  Service: Endoscopy;  Laterality: N/A;  PUSH   ENTEROSCOPY N/A 07/13/2021   Procedure: ENTEROSCOPY;  Surgeon: Unk Corinn Skiff, MD;  Location: [facility] ENDOSCOPY;  Service: Gastroenterology;  Laterality: N/A;   ENTEROSCOPY N/A 10/26/2021   Procedure: ENTEROSCOPY;  Surgeon: Unk Corinn Skiff, MD;  Location: [facility] ENDOSCOPY;  Service: Gastroenterology;  Laterality: N/A;  push   ENTEROSCOPY N/A 07/27/2022   Procedure: ENTEROSCOPY;  Surgeon: Jinny Carmine, MD;  Location: [facility] ENDOSCOPY;  Service: Endoscopy;  Laterality: N/A;  Push   ENTEROSCOPY N/A 06/29/2023   Procedure: ENTEROSCOPY;  Surgeon: Therisa Bi, MD;  Location: [facility] ENDOSCOPY;  Service: Gastroenterology;  Laterality: N/A;  Push   ENTEROSCOPY N/A 08/10/2023   Procedure: ENTEROSCOPY;   Surgeon: Unk Corinn Skiff, MD;  Location: [facility] ENDOSCOPY;  Service: Gastroenterology;  Laterality: N/A;  Push   ESOPHAGOGASTRODUODENOSCOPY Left 04/13/2017   Procedure: ESOPHAGOGASTRODUODENOSCOPY (EGD);  Surgeon: Janalyn Keene NOVAK, MD;  Location: [facility] ENDOSCOPY;  Service: Endoscopy;  Laterality: Left;   ESOPHAGOGASTRODUODENOSCOPY (EGD) WITH PROPOFOL  N/A 07/20/2017   Procedure: ESOPHAGOGASTRODUODENOSCOPY (EGD) WITH PROPOFOL ;  Surgeon: Janalyn Keene NOVAK, MD;  Location: [facility] ENDOSCOPY;  Service: Endoscopy;  Laterality: N/A;   ESOPHAGOGASTRODUODENOSCOPY (EGD) WITH PROPOFOL  N/A 12/12/2017   Procedure: ESOPHAGOGASTRODUODENOSCOPY (EGD) WITH PROPOFOL ;  Surgeon: Janalyn Keene NOVAK, MD;  Location: [facility] ENDOSCOPY;  Service: Endoscopy;  Laterality: N/A;   ESOPHAGOGASTRODUODENOSCOPY (EGD) WITH PROPOFOL  N/A 12/23/2020   Procedure: ESOPHAGOGASTRODUODENOSCOPY (EGD) WITH PROPOFOL ;  Surgeon: Janalyn Keene NOVAK, MD;  Location: [facility] ENDOSCOPY;  Service: Endoscopy;  Laterality: N/A;   ESOPHAGOGASTRODUODENOSCOPY (EGD) WITH PROPOFOL  N/A 01/27/2022   Procedure: ESOPHAGOGASTRODUODENOSCOPY (EGD) WITH PROPOFOL ;  Surgeon: Therisa Bi, MD;  Location: [facility] ENDOSCOPY;  Service: Gastroenterology;  Laterality: N/A;   GIVENS CAPSULE STUDY N/A 03/10/2021   Procedure: GIVENS CAPSULE STUDY;  Surgeon: Janalyn Keene NOVAK, MD;  Location: [facility] ENDOSCOPY;  Service: Endoscopy;  Laterality: N/A;   HOT HEMOSTASIS  06/29/2023   Procedure: HOT HEMOSTASIS (ARGON PLASMA COAGULATION/BICAP);  Surgeon: Therisa Bi, MD;  Location: [facility] ENDOSCOPY;  Service: Gastroenterology;;   HOT HEMOSTASIS  08/10/2023   Procedure: EGD, WITH ARGON PLASMA COAGULATION;  Surgeon: Unk Corinn Skiff, MD;  Location: [facility] ENDOSCOPY;  Service: Gastroenterology;;   MEDIAL PARTIAL KNEE REPLACEMENT Left 1990   torn ligament. no knee replacement at that time   PARTIAL HYSTERECTOMY     SHOULDER ARTHROSCOPY WITH OPEN ROTATOR CUFF REPAIR Left 03/01/2016   Procedure:  SHOULDER ARTHROSCOPY WITH OPEN ROTATOR CUFF REPAIR;  Surgeon: Franky Cranker, MD;  Location: [facility] ORS;  Service: Orthopedics;  Laterality: Left;   TOTAL KNEE ARTHROPLASTY Left 02/14/2018   Procedure: TOTAL KNEE ARTHROPLASTY;  Surgeon: Cranker Franky, MD;  Location: [facility] ORS;  Service: Orthopedics;  Laterality: Left;    FAMILY HISTORY: Family History  Problem Relation Age of Onset   CAD Mother    CAD Sister    Breast cancer Neg Hx     ADVANCED DIRECTIVES (Y/N):  N  HEALTH MAINTENANCE: Social History   Tobacco Use   Smoking status: Some Days    Current packs/day: 0.00    Average packs/day: 0.3 packs/day for 50.0 years (12.5 ttl pk-yrs)    Types: Cigarettes    Start date: 03/13/1966    Last attempt to quit: 03/13/2016    Years since quitting: 8.0   Smokeless tobacco: Never  Vaping Use   Vaping status: Never Used  Substance Use Topics   Alcohol use: No   Drug use: No     Colonoscopy:  PAP:  Bone density:  Lipid panel:  Allergies  Allergen Reactions   Latex Rash    Welts over body    Current Outpatient Medications  Medication Sig Dispense Refill   albuterol  (VENTOLIN  HFA) 108 (90 Base) MCG/ACT inhaler Inhale 2 puffs into the lungs every 6 (six) hours as needed for wheezing.     amitriptyline  (ELAVIL ) 25 MG tablet Take 25 mg by mouth at bedtime.     amitriptyline  (ELAVIL ) 50 MG tablet Take  50 mg by mouth at bedtime.     budesonide-formoterol  (SYMBICORT) 160-4.5 MCG/ACT inhaler Inhale 2 puffs into the lungs 2 (two) times daily.     escitalopram (LEXAPRO) 5 MG tablet Take 5 mg by mouth daily.     folic acid  (FOLVITE ) 1 MG tablet Take 1 mg by mouth daily.     furosemide  (LASIX ) 20 MG tablet Take 20 mg daily by mouth.      lisinopril  (PRINIVIL ,ZESTRIL ) 20 MG tablet Take 20 mg by mouth daily.     omeprazole  (PRILOSEC ) 40 MG capsule Take 1 capsule (40 mg total) by mouth daily. 90 capsule 3   potassium chloride  (KLOR-CON  M) 10 MEQ tablet Take 10 mEq by mouth daily.      rOPINIRole  (REQUIP ) 2 MG tablet Take 2-4 mg by mouth at bedtime as needed (restless leg syndrome).     simvastatin  (ZOCOR ) 20 MG tablet Take 20 mg by mouth at bedtime.     SPIRIVA  HANDIHALER 18 MCG inhalation capsule Place 18 mcg into inhaler and inhale daily.     SUMAtriptan  6 MG/0.5ML SOAJ Inject into the skin.     vitamin B-12 (CYANOCOBALAMIN ) 1000 MCG tablet Take 1,000 mcg by mouth daily.     No current facility-administered medications for this visit.    OBJECTIVE: Vitals:   04/16/24 1030 04/16/24 1032  BP: (!) 150/68 (!) 148/65  Pulse: 73   Resp: 18   Temp: (!) 96.5 F (35.8 C)   SpO2: 100%       Body mass index is 26.93 kg/m.    ECOG FS:1 - Symptomatic but completely ambulatory  General: Well-developed, well-nourished, no acute distress. Eyes: Pink conjunctiva, anicteric sclera. HEENT: Normocephalic, moist mucous membranes. Lungs: No audible wheezing or coughing. Heart: Regular rate and rhythm. Abdomen: Soft, nontender, no obvious distention. Musculoskeletal: No edema, cyanosis, or clubbing. Neuro: Alert, answering all questions appropriately. Cranial nerves grossly intact. Skin: No rashes or petechiae noted. Psych: Normal affect.  LAB RESULTS:  Lab Results  Component Value Date   NA 144 08/10/2023   K 3.7 08/10/2023   CL 109 08/10/2023   CO2 29 08/10/2023   GLUCOSE 104 (H) 08/10/2023   BUN 17 08/10/2023   CREATININE 0.68 08/10/2023   CALCIUM 8.2 (L) 08/10/2023   PROT 5.7 (L) 06/13/2023   ALBUMIN 3.3 (L) 06/13/2023   AST 17 06/13/2023   ALT 13 06/13/2023   ALKPHOS 28 (L) 06/13/2023   BILITOT 0.4 06/13/2023   GFRNONAA >60 08/10/2023   GFRAA >60 02/16/2018    Lab Results  Component Value Date   WBC 5.9 04/16/2024   NEUTROABS 4.0 04/16/2024   HGB 8.4 (L) 04/16/2024   HCT 28.1 (L) 04/16/2024   MCV 71.9 (L) 04/16/2024   PLT 426 (H) 04/16/2024   Lab Results  Component Value Date   IRON  26 (L) 12/19/2023   TIBC 363 12/19/2023   IRONPCTSAT 7 (L)  12/19/2023   Lab Results  Component Value Date   FERRITIN 7 (L) 12/19/2023     STUDIES: No results found.    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  Hemoglobin is improved to 8.4 today.  She continues to only require transfusion approximately every 4 weeks.  Proceed with octreotide  today.  She does not require transfusion tomorrow.  Return to clinic in 4 weeks for  further evaluation and continuation of treatment. History of AVMs: EGD and octreotide  as above.  Continue follow-up with GI as scheduled.   Tendon repair: Improved.  I spent a total of 30 minutes reviewing chart data, face-to-face evaluation with the patient, counseling and coordination of care as detailed above.    Patient expressed understanding and was in agreement with this plan. She also understands that She can call clinic at any time with any questions, concerns, or complaints.    Evalene JINNY Reusing, MD   04/16/2024 10:40 AM

## 2024-04-17 ENCOUNTER — Inpatient Hospital Stay

## 2024-04-17 ENCOUNTER — Encounter: Payer: Self-pay | Admitting: Oncology

## 2024-05-14 ENCOUNTER — Inpatient Hospital Stay: Attending: Oncology

## 2024-05-14 ENCOUNTER — Inpatient Hospital Stay (HOSPITAL_BASED_OUTPATIENT_CLINIC_OR_DEPARTMENT_OTHER): Admitting: Oncology

## 2024-05-14 ENCOUNTER — Inpatient Hospital Stay

## 2024-05-14 ENCOUNTER — Encounter: Payer: Self-pay | Admitting: Oncology

## 2024-05-14 DIAGNOSIS — K922 Gastrointestinal hemorrhage, unspecified: Secondary | ICD-10-CM | POA: Diagnosis present

## 2024-05-14 DIAGNOSIS — D509 Iron deficiency anemia, unspecified: Secondary | ICD-10-CM

## 2024-05-14 DIAGNOSIS — D649 Anemia, unspecified: Secondary | ICD-10-CM | POA: Diagnosis not present

## 2024-05-14 DIAGNOSIS — Z79899 Other long term (current) drug therapy: Secondary | ICD-10-CM | POA: Insufficient documentation

## 2024-05-14 DIAGNOSIS — D500 Iron deficiency anemia secondary to blood loss (chronic): Secondary | ICD-10-CM | POA: Diagnosis present

## 2024-05-14 DIAGNOSIS — Q2730 Arteriovenous malformation, site unspecified: Secondary | ICD-10-CM | POA: Diagnosis present

## 2024-05-14 LAB — CBC WITH DIFFERENTIAL/PLATELET
Abs Immature Granulocytes: 0.01 K/uL (ref 0.00–0.07)
Basophils Absolute: 0 K/uL (ref 0.0–0.1)
Basophils Relative: 1 %
Eosinophils Absolute: 0 K/uL (ref 0.0–0.5)
Eosinophils Relative: 1 %
HCT: 25.9 % — ABNORMAL LOW (ref 36.0–46.0)
Hemoglobin: 7.8 g/dL — ABNORMAL LOW (ref 12.0–15.0)
Immature Granulocytes: 0 %
Lymphocytes Relative: 22 %
Lymphs Abs: 0.7 K/uL (ref 0.7–4.0)
MCH: 21.4 pg — ABNORMAL LOW (ref 26.0–34.0)
MCHC: 30.1 g/dL (ref 30.0–36.0)
MCV: 71.2 fL — ABNORMAL LOW (ref 80.0–100.0)
Monocytes Absolute: 0.4 K/uL (ref 0.1–1.0)
Monocytes Relative: 12 %
Neutro Abs: 2.1 K/uL (ref 1.7–7.7)
Neutrophils Relative %: 64 %
Platelets: 321 K/uL (ref 150–400)
RBC: 3.64 MIL/uL — ABNORMAL LOW (ref 3.87–5.11)
RDW: 23.2 % — ABNORMAL HIGH (ref 11.5–15.5)
WBC: 3.2 K/uL — ABNORMAL LOW (ref 4.0–10.5)
nRBC: 0 % (ref 0.0–0.2)

## 2024-05-14 LAB — PREPARE RBC (CROSSMATCH)

## 2024-05-14 MED ORDER — OCTREOTIDE ACETATE 30 MG IM KIT
30.0000 mg | PACK | Freq: Once | INTRAMUSCULAR | Status: AC
  Administered 2024-05-14: 30 mg via INTRAMUSCULAR
  Filled 2024-05-14: qty 1

## 2024-05-14 NOTE — Progress Notes (Signed)
 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  INTERVAL HISTORY: Patient returns to clinic today for repeat laboratory, further evaluation, and continuation of octreotide  and possible blood transfusion.  She has mild fatigue, but otherwise feels well. She continues to deny any melena or hematochezia. She had no neurologic complaints.  She denies any recent fevers or illnesses.  She has a fair appetite.  She denies any chest pain, shortness of breath, cough, or hemoptysis.  She denies any nausea, vomiting, constipation, or diarrhea.  She has no urinary complaints.  Patient offers no further specific complaints today.  REVIEW OF SYSTEMS:   Review of Systems  Constitutional:  Positive for malaise/fatigue. Negative for fever and weight loss.  Respiratory: Negative.  Negative for cough and shortness of breath.   Cardiovascular: Negative.  Negative for chest pain and leg swelling.  Gastrointestinal: Negative.  Negative for abdominal pain, blood in stool and melena.  Genitourinary: Negative.  Negative for hematuria.  Musculoskeletal: Negative.  Negative for back pain.  Skin: Negative.  Negative for rash.  Neurological: Negative.  Negative for dizziness, focal weakness, weakness and headaches.  Psychiatric/Behavioral: Negative.  The patient is not nervous/anxious.     As per HPI. Otherwise, a complete review of systems is negative.  PAST MEDICAL HISTORY: Past Medical History:  Diagnosis Date   Anemia    Arthritis    Breast cancer (HCC) 1990   Right   COPD (chronic obstructive pulmonary disease) (HCC)    Dyspnea    DOE   Elevated lipids    Hypertension    Lower extremity edema    Migraines     Migraines    Personal history of radiation therapy 1990   Breast   Restless leg syndrome    Rotator cuff injury     PAST SURGICAL HISTORY: Past Surgical History:  Procedure Laterality Date   ABDOMINAL HYSTERECTOMY     BACK SURGERY  08/2017   CERVICAL FUSION   BREAST BIOPSY Right 1990   LUMPECTOMY.  took lymph nodes   BREAST LUMPECTOMY     COLONOSCOPY WITH PROPOFOL  N/A 07/20/2017   Procedure: COLONOSCOPY WITH PROPOFOL ;  Surgeon: Janalyn Keene NOVAK, MD;  Location: [facility] ENDOSCOPY;  Service: Endoscopy;  Laterality: N/A;   COLONOSCOPY WITH PROPOFOL  N/A 01/27/2022   Procedure: COLONOSCOPY WITH PROPOFOL ;  Surgeon: Therisa Bi, MD;  Location: [facility] ENDOSCOPY;  Service: Gastroenterology;  Laterality: N/A;   ENTEROSCOPY N/A 08/08/2017   Procedure: ENTEROSCOPY;  Surgeon: Unk Corinn Skiff, MD;  Location: [facility] ENDOSCOPY;  Service: Gastroenterology;  Laterality: N/A;   ENTEROSCOPY N/A 05/07/2020   Procedure: ENTEROSCOPY with Adult colonoscope;  Surgeon: Janalyn Keene NOVAK, MD;  Location: [facility] ENDOSCOPY;  Service: Endoscopy;  Laterality: N/A;   ENTEROSCOPY N/A 03/17/2021   Procedure: ENTEROSCOPY;  Surgeon: Janalyn Keene NOVAK, MD;  Location: [facility] ENDOSCOPY;  Service: Endoscopy;  Laterality: N/A;  PUSH   ENTEROSCOPY N/A 07/13/2021   Procedure: ENTEROSCOPY;  Surgeon: Unk Corinn Skiff, MD;  Location: [facility] ENDOSCOPY;  Service: Gastroenterology;  Laterality: N/A;   ENTEROSCOPY N/A 10/26/2021   Procedure: ENTEROSCOPY;  Surgeon: Unk Corinn Skiff, MD;  Location: [facility] ENDOSCOPY;  Service: Gastroenterology;  Laterality: N/A;  push   ENTEROSCOPY N/A 07/27/2022   Procedure: ENTEROSCOPY;  Surgeon: Jinny Carmine, MD;  Location: [facility] ENDOSCOPY;  Service: Endoscopy;  Laterality: N/A;  Push   ENTEROSCOPY N/A 06/29/2023   Procedure: ENTEROSCOPY;  Surgeon: Therisa Bi, MD;  Location: [facility] ENDOSCOPY;  Service: Gastroenterology;  Laterality: N/A;  Push   ENTEROSCOPY N/A 08/10/2023   Procedure: ENTEROSCOPY;   Surgeon: Unk Corinn Skiff, MD;  Location: [facility] ENDOSCOPY;  Service: Gastroenterology;  Laterality: N/A;  Push   ESOPHAGOGASTRODUODENOSCOPY Left 04/13/2017   Procedure: ESOPHAGOGASTRODUODENOSCOPY (EGD);  Surgeon: Janalyn Keene NOVAK, MD;  Location: [facility] ENDOSCOPY;  Service: Endoscopy;  Laterality: Left;   ESOPHAGOGASTRODUODENOSCOPY (EGD) WITH PROPOFOL  N/A 07/20/2017   Procedure: ESOPHAGOGASTRODUODENOSCOPY (EGD) WITH PROPOFOL ;  Surgeon: Janalyn Keene NOVAK, MD;  Location: [facility] ENDOSCOPY;  Service: Endoscopy;  Laterality: N/A;   ESOPHAGOGASTRODUODENOSCOPY (EGD) WITH PROPOFOL  N/A 12/12/2017   Procedure: ESOPHAGOGASTRODUODENOSCOPY (EGD) WITH PROPOFOL ;  Surgeon: Janalyn Keene NOVAK, MD;  Location: [facility] ENDOSCOPY;  Service: Endoscopy;  Laterality: N/A;   ESOPHAGOGASTRODUODENOSCOPY (EGD) WITH PROPOFOL  N/A 12/23/2020   Procedure: ESOPHAGOGASTRODUODENOSCOPY (EGD) WITH PROPOFOL ;  Surgeon: Janalyn Keene NOVAK, MD;  Location: [facility] ENDOSCOPY;  Service: Endoscopy;  Laterality: N/A;   ESOPHAGOGASTRODUODENOSCOPY (EGD) WITH PROPOFOL  N/A 01/27/2022   Procedure: ESOPHAGOGASTRODUODENOSCOPY (EGD) WITH PROPOFOL ;  Surgeon: Therisa Bi, MD;  Location: [facility] ENDOSCOPY;  Service: Gastroenterology;  Laterality: N/A;   GIVENS CAPSULE STUDY N/A 03/10/2021   Procedure: GIVENS CAPSULE STUDY;  Surgeon: Janalyn Keene NOVAK, MD;  Location: [facility] ENDOSCOPY;  Service: Endoscopy;  Laterality: N/A;   HOT HEMOSTASIS  06/29/2023   Procedure: HOT HEMOSTASIS (ARGON PLASMA COAGULATION/BICAP);  Surgeon: Therisa Bi, MD;  Location: [facility] ENDOSCOPY;  Service: Gastroenterology;;   HOT HEMOSTASIS  08/10/2023   Procedure: EGD, WITH ARGON PLASMA COAGULATION;  Surgeon: Unk Corinn Skiff, MD;  Location: [facility] ENDOSCOPY;  Service: Gastroenterology;;   MEDIAL PARTIAL KNEE REPLACEMENT Left 1990   torn ligament. no knee replacement at that time   PARTIAL HYSTERECTOMY     SHOULDER ARTHROSCOPY WITH OPEN ROTATOR CUFF REPAIR Left 03/01/2016   Procedure:  SHOULDER ARTHROSCOPY WITH OPEN ROTATOR CUFF REPAIR;  Surgeon: Franky Cranker, MD;  Location: [facility] ORS;  Service: Orthopedics;  Laterality: Left;   TOTAL KNEE ARTHROPLASTY Left 02/14/2018   Procedure: TOTAL KNEE ARTHROPLASTY;  Surgeon: Cranker Franky, MD;  Location: [facility] ORS;  Service: Orthopedics;  Laterality: Left;    FAMILY HISTORY: Family History  Problem Relation Age of Onset   CAD Mother    CAD Sister    Breast cancer Neg Hx     ADVANCED DIRECTIVES (Y/N):  N  HEALTH MAINTENANCE: Social History   Tobacco Use   Smoking status: Some Days    Current packs/day: 0.00    Average packs/day: 0.3 packs/day for 50.0 years (12.5 ttl pk-yrs)    Types: Cigarettes    Start date: 03/13/1966    Last attempt to quit: 03/13/2016    Years since quitting: 8.1   Smokeless tobacco: Never  Vaping Use   Vaping status: Never Used  Substance Use Topics   Alcohol use: No   Drug use: No     Colonoscopy:  PAP:  Bone density:  Lipid panel:  Allergies  Allergen Reactions   Latex Rash    Welts over body    Current Outpatient Medications  Medication Sig Dispense Refill   albuterol  (VENTOLIN  HFA) 108 (90 Base) MCG/ACT inhaler Inhale 2 puffs into the lungs every 6 (six) hours as needed for wheezing.     amitriptyline  (ELAVIL ) 25 MG tablet Take 25 mg by mouth at bedtime.     amitriptyline  (ELAVIL ) 50 MG tablet Take  50 mg by mouth at bedtime.     budesonide-formoterol  (SYMBICORT) 160-4.5 MCG/ACT inhaler Inhale 2 puffs into the lungs 2 (two) times daily.     escitalopram (LEXAPRO) 5 MG tablet Take 5 mg by mouth daily.     folic acid  (FOLVITE ) 1 MG tablet Take 1 mg by mouth daily.     furosemide  (LASIX ) 20 MG tablet Take 20 mg daily by mouth.      lisinopril  (PRINIVIL ,ZESTRIL ) 20 MG tablet Take 20 mg by mouth daily.     omeprazole  (PRILOSEC ) 40 MG capsule Take 1 capsule (40 mg total) by mouth daily. 90 capsule 3   potassium chloride  (KLOR-CON  M) 10 MEQ tablet Take 10 mEq by mouth daily.      rOPINIRole  (REQUIP ) 2 MG tablet Take 2-4 mg by mouth at bedtime as needed (restless leg syndrome).     simvastatin  (ZOCOR ) 20 MG tablet Take 20 mg by mouth at bedtime.     SPIRIVA  HANDIHALER 18 MCG inhalation capsule Place 18 mcg into inhaler and inhale daily.     SUMAtriptan  6 MG/0.5ML SOAJ Inject into the skin.     vitamin B-12 (CYANOCOBALAMIN ) 1000 MCG tablet Take 1,000 mcg by mouth daily.     No current facility-administered medications for this visit.    OBJECTIVE: There were no vitals filed for this visit.     There is no height or weight on file to calculate BMI.    ECOG FS:1 - Symptomatic but completely ambulatory  General: Well-developed, well-nourished, no acute distress. Eyes: Pink conjunctiva, anicteric sclera. HEENT: Normocephalic, moist mucous membranes. Lungs: No audible wheezing or coughing. Heart: Regular rate and rhythm. Abdomen: Soft, nontender, no obvious distention. Musculoskeletal: No edema, cyanosis, or clubbing. Neuro: Alert, answering all questions appropriately. Cranial nerves grossly intact. Skin: No rashes or petechiae noted. Psych: Normal affect.  LAB RESULTS:  Lab Results  Component Value Date   NA 144 08/10/2023   K 3.7 08/10/2023   CL 109 08/10/2023   CO2 29 08/10/2023   GLUCOSE 104 (H) 08/10/2023   BUN 17 08/10/2023   CREATININE 0.68 08/10/2023   CALCIUM 8.2 (L) 08/10/2023   PROT 5.7 (L) 06/13/2023   ALBUMIN 3.3 (L) 06/13/2023   AST 17 06/13/2023   ALT 13 06/13/2023   ALKPHOS 28 (L) 06/13/2023   BILITOT 0.4 06/13/2023   GFRNONAA >60 08/10/2023   GFRAA >60 02/16/2018    Lab Results  Component Value Date   WBC 3.2 (L) 05/14/2024   NEUTROABS 2.1 05/14/2024   HGB 7.8 (L) 05/14/2024   HCT 25.9 (L) 05/14/2024   MCV 71.2 (L) 05/14/2024   PLT 321 05/14/2024   Lab Results  Component Value Date   IRON  26 (L) 12/19/2023   TIBC 363 12/19/2023   IRONPCTSAT 7 (L) 12/19/2023   Lab Results  Component Value Date   FERRITIN 7 (L)  12/19/2023     STUDIES: No results found.    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  Hemoglobin has trended down slightly to 7.8 and patient is mildly symptomatic.  She continues to only require transfusion approximately every 4-8 weeks.  Proceed with octreotide  today.  Return to clinic tomorrow for 1 unit packed red blood cells.  Patient will then return to clinic in 4 weeks for further evaluation and continuation of treatment.  History of AVMs: EGD and octreotide  as above.  Continue follow-up with GI as scheduled.   Tendon repair: Improved.  I spent a total of 30 minutes reviewing chart data, face-to-face evaluation with the patient, counseling and coordination of care as detailed above.   Patient expressed understanding and was in agreement with this plan. She also understands that She can call clinic at any time with any questions, concerns, or complaints.    Evalene JINNY Reusing, MD   05/14/2024 12:18 PM

## 2024-05-14 NOTE — Progress Notes (Signed)
 Patient is feeling more tired and fatigued recently.

## 2024-05-15 ENCOUNTER — Inpatient Hospital Stay

## 2024-05-15 DIAGNOSIS — K922 Gastrointestinal hemorrhage, unspecified: Secondary | ICD-10-CM | POA: Diagnosis not present

## 2024-05-15 DIAGNOSIS — D509 Iron deficiency anemia, unspecified: Secondary | ICD-10-CM

## 2024-05-15 MED ORDER — ACETAMINOPHEN 325 MG PO TABS
650.0000 mg | ORAL_TABLET | Freq: Once | ORAL | Status: AC
  Administered 2024-05-15: 650 mg via ORAL
  Filled 2024-05-15: qty 2

## 2024-05-15 MED ORDER — SODIUM CHLORIDE 0.9% IV SOLUTION
250.0000 mL | INTRAVENOUS | Status: DC
  Administered 2024-05-15: 100 mL via INTRAVENOUS
  Filled 2024-05-15: qty 250

## 2024-05-15 MED ORDER — DIPHENHYDRAMINE HCL 50 MG/ML IJ SOLN
25.0000 mg | Freq: Once | INTRAMUSCULAR | Status: AC
  Administered 2024-05-15: 25 mg via INTRAVENOUS
  Filled 2024-05-15: qty 1

## 2024-05-16 LAB — TYPE AND SCREEN
ABO/RH(D): O POS
Antibody Screen: NEGATIVE
Unit division: 0

## 2024-05-16 LAB — BPAM RBC
Blood Product Expiration Date: 202601112359
ISSUE DATE / TIME: 202512110935
Unit Type and Rh: 5100

## 2024-06-10 ENCOUNTER — Inpatient Hospital Stay (HOSPITAL_BASED_OUTPATIENT_CLINIC_OR_DEPARTMENT_OTHER): Admitting: Oncology

## 2024-06-10 ENCOUNTER — Inpatient Hospital Stay

## 2024-06-10 ENCOUNTER — Encounter: Payer: Self-pay | Admitting: Oncology

## 2024-06-10 ENCOUNTER — Inpatient Hospital Stay: Attending: Oncology

## 2024-06-10 VITALS — BP 142/86 | HR 92 | Temp 97.6°F | Resp 17 | Wt 151.0 lb

## 2024-06-10 DIAGNOSIS — G2581 Restless legs syndrome: Secondary | ICD-10-CM | POA: Insufficient documentation

## 2024-06-10 DIAGNOSIS — D649 Anemia, unspecified: Secondary | ICD-10-CM | POA: Diagnosis not present

## 2024-06-10 DIAGNOSIS — D509 Iron deficiency anemia, unspecified: Secondary | ICD-10-CM

## 2024-06-10 DIAGNOSIS — M1990 Unspecified osteoarthritis, unspecified site: Secondary | ICD-10-CM | POA: Insufficient documentation

## 2024-06-10 DIAGNOSIS — K922 Gastrointestinal hemorrhage, unspecified: Secondary | ICD-10-CM | POA: Insufficient documentation

## 2024-06-10 DIAGNOSIS — Q2730 Arteriovenous malformation, site unspecified: Secondary | ICD-10-CM | POA: Insufficient documentation

## 2024-06-10 DIAGNOSIS — I10 Essential (primary) hypertension: Secondary | ICD-10-CM | POA: Insufficient documentation

## 2024-06-10 DIAGNOSIS — Z79899 Other long term (current) drug therapy: Secondary | ICD-10-CM | POA: Insufficient documentation

## 2024-06-10 DIAGNOSIS — J449 Chronic obstructive pulmonary disease, unspecified: Secondary | ICD-10-CM | POA: Insufficient documentation

## 2024-06-10 DIAGNOSIS — Z923 Personal history of irradiation: Secondary | ICD-10-CM | POA: Insufficient documentation

## 2024-06-10 DIAGNOSIS — D500 Iron deficiency anemia secondary to blood loss (chronic): Secondary | ICD-10-CM | POA: Insufficient documentation

## 2024-06-10 DIAGNOSIS — F17210 Nicotine dependence, cigarettes, uncomplicated: Secondary | ICD-10-CM | POA: Insufficient documentation

## 2024-06-10 LAB — CBC WITH DIFFERENTIAL/PLATELET
Abs Immature Granulocytes: 0.02 K/uL (ref 0.00–0.07)
Basophils Absolute: 0 K/uL (ref 0.0–0.1)
Basophils Relative: 1 %
Eosinophils Absolute: 0 K/uL (ref 0.0–0.5)
Eosinophils Relative: 1 %
HCT: 30.6 % — ABNORMAL LOW (ref 36.0–46.0)
Hemoglobin: 9.3 g/dL — ABNORMAL LOW (ref 12.0–15.0)
Immature Granulocytes: 0 %
Lymphocytes Relative: 23 %
Lymphs Abs: 1.2 K/uL (ref 0.7–4.0)
MCH: 21.8 pg — ABNORMAL LOW (ref 26.0–34.0)
MCHC: 30.4 g/dL (ref 30.0–36.0)
MCV: 71.8 fL — ABNORMAL LOW (ref 80.0–100.0)
Monocytes Absolute: 0.5 K/uL (ref 0.1–1.0)
Monocytes Relative: 10 %
Neutro Abs: 3.4 K/uL (ref 1.7–7.7)
Neutrophils Relative %: 65 %
Platelets: 299 K/uL (ref 150–400)
RBC: 4.26 MIL/uL (ref 3.87–5.11)
RDW: 21.6 % — ABNORMAL HIGH (ref 11.5–15.5)
WBC: 5.1 K/uL (ref 4.0–10.5)
nRBC: 0 % (ref 0.0–0.2)

## 2024-06-10 LAB — SAMPLE TO BLOOD BANK

## 2024-06-10 MED ORDER — OCTREOTIDE ACETATE 30 MG IM KIT
30.0000 mg | PACK | Freq: Once | INTRAMUSCULAR | Status: AC
  Administered 2024-06-10: 30 mg via INTRAMUSCULAR
  Filled 2024-06-10: qty 1

## 2024-06-10 NOTE — Progress Notes (Signed)
 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  INTERVAL HISTORY: Patient returns to clinic today for repeat laboratory work, further evaluation, and continuation of octreotide .  She continues to have chronic fatigue, but otherwise feels well.  She continues to deny any melena or hematochezia.  She has no neurologic complaints.  She denies any recent fevers or illnesses.  She has a fair appetite.  She denies any chest pain, shortness of breath, cough, or hemoptysis.  She denies any nausea, vomiting, constipation, or diarrhea.  She has no urinary complaints.  Patient offers no further specific complaints today.  REVIEW OF SYSTEMS:   Review of Systems  Constitutional:  Positive for malaise/fatigue. Negative for fever and weight loss.  Respiratory: Negative.  Negative for cough and shortness of breath.   Cardiovascular: Negative.  Negative for chest pain and leg swelling.  Gastrointestinal: Negative.  Negative for abdominal pain, blood in stool and melena.  Genitourinary: Negative.  Negative for hematuria.  Musculoskeletal: Negative.  Negative for back pain.  Skin: Negative.  Negative for rash.  Neurological: Negative.  Negative for dizziness, focal weakness, weakness and headaches.  Psychiatric/Behavioral: Negative.  The patient is not nervous/anxious.     As per HPI. Otherwise, a complete review of systems is negative.  PAST MEDICAL HISTORY: Past Medical History:  Diagnosis Date   Anemia    Arthritis    Breast cancer (HCC) 1990   Right   COPD (chronic obstructive pulmonary disease) (HCC)    Dyspnea    DOE   Elevated lipids    Hypertension    Lower extremity edema    Migraines    Migraines     Personal history of radiation therapy 1990   Breast   Restless leg syndrome    Rotator cuff injury     PAST SURGICAL HISTORY: Past Surgical History:  Procedure Laterality Date   ABDOMINAL HYSTERECTOMY     BACK SURGERY  08/2017   CERVICAL FUSION   BREAST BIOPSY Right 1990   LUMPECTOMY.  took lymph nodes   BREAST LUMPECTOMY     COLONOSCOPY WITH PROPOFOL  N/A 07/20/2017   Procedure: COLONOSCOPY WITH PROPOFOL ;  Surgeon: Janalyn Keene NOVAK, MD;  Location: [facility] ENDOSCOPY;  Service: Endoscopy;  Laterality: N/A;   COLONOSCOPY WITH PROPOFOL  N/A 01/27/2022   Procedure: COLONOSCOPY WITH PROPOFOL ;  Surgeon: Therisa Bi, MD;  Location: [facility] ENDOSCOPY;  Service: Gastroenterology;  Laterality: N/A;   ENTEROSCOPY N/A 08/08/2017   Procedure: ENTEROSCOPY;  Surgeon: Unk Corinn Skiff, MD;  Location: [facility] ENDOSCOPY;  Service: Gastroenterology;  Laterality: N/A;   ENTEROSCOPY N/A 05/07/2020   Procedure: ENTEROSCOPY with Adult colonoscope;  Surgeon: Janalyn Keene NOVAK, MD;  Location: [facility] ENDOSCOPY;  Service: Endoscopy;  Laterality: N/A;   ENTEROSCOPY N/A 03/17/2021   Procedure: ENTEROSCOPY;  Surgeon: Janalyn Keene NOVAK, MD;  Location: [facility] ENDOSCOPY;  Service: Endoscopy;  Laterality: N/A;  PUSH   ENTEROSCOPY N/A 07/13/2021   Procedure: ENTEROSCOPY;  Surgeon: Unk Corinn Skiff, MD;  Location: [facility] ENDOSCOPY;  Service: Gastroenterology;  Laterality: N/A;   ENTEROSCOPY N/A 10/26/2021   Procedure: ENTEROSCOPY;  Surgeon: Unk Corinn Skiff, MD;  Location: [facility] ENDOSCOPY;  Service: Gastroenterology;  Laterality: N/A;  push   ENTEROSCOPY N/A 07/27/2022   Procedure: ENTEROSCOPY;  Surgeon: Jinny Carmine, MD;  Location: [facility] ENDOSCOPY;  Service: Endoscopy;  Laterality: N/A;  Push   ENTEROSCOPY N/A 06/29/2023   Procedure: ENTEROSCOPY;  Surgeon: Therisa Bi, MD;  Location: [facility] ENDOSCOPY;  Service: Gastroenterology;  Laterality: N/A;  Push   ENTEROSCOPY N/A 08/10/2023   Procedure: ENTEROSCOPY;  Surgeon: Unk Corinn Skiff, MD;  Location: [facility] ENDOSCOPY;  Service: Gastroenterology;  Laterality: N/A;  Push   ESOPHAGOGASTRODUODENOSCOPY Left 04/13/2017   Procedure: ESOPHAGOGASTRODUODENOSCOPY (EGD);  Surgeon: Janalyn Keene NOVAK, MD;  Location: [facility] ENDOSCOPY;  Service: Endoscopy;  Laterality: Left;   ESOPHAGOGASTRODUODENOSCOPY (EGD) WITH PROPOFOL  N/A 07/20/2017   Procedure: ESOPHAGOGASTRODUODENOSCOPY (EGD) WITH PROPOFOL ;  Surgeon: Janalyn Keene NOVAK, MD;  Location: [facility] ENDOSCOPY;  Service: Endoscopy;  Laterality: N/A;   ESOPHAGOGASTRODUODENOSCOPY (EGD) WITH PROPOFOL  N/A 12/12/2017   Procedure: ESOPHAGOGASTRODUODENOSCOPY (EGD) WITH PROPOFOL ;  Surgeon: Janalyn Keene NOVAK, MD;  Location: [facility] ENDOSCOPY;  Service: Endoscopy;  Laterality: N/A;   ESOPHAGOGASTRODUODENOSCOPY (EGD) WITH PROPOFOL  N/A 12/23/2020   Procedure: ESOPHAGOGASTRODUODENOSCOPY (EGD) WITH PROPOFOL ;  Surgeon: Janalyn Keene NOVAK, MD;  Location: [facility] ENDOSCOPY;  Service: Endoscopy;  Laterality: N/A;   ESOPHAGOGASTRODUODENOSCOPY (EGD) WITH PROPOFOL  N/A 01/27/2022   Procedure: ESOPHAGOGASTRODUODENOSCOPY (EGD) WITH PROPOFOL ;  Surgeon: Therisa Bi, MD;  Location: [facility] ENDOSCOPY;  Service: Gastroenterology;  Laterality: N/A;   GIVENS CAPSULE STUDY N/A 03/10/2021   Procedure: GIVENS CAPSULE STUDY;  Surgeon: Janalyn Keene NOVAK, MD;  Location: [facility] ENDOSCOPY;  Service: Endoscopy;  Laterality: N/A;   HOT HEMOSTASIS  06/29/2023   Procedure: HOT HEMOSTASIS (ARGON PLASMA COAGULATION/BICAP);  Surgeon: Therisa Bi, MD;  Location: [facility] ENDOSCOPY;  Service: Gastroenterology;;   HOT HEMOSTASIS  08/10/2023   Procedure: EGD, WITH ARGON PLASMA COAGULATION;  Surgeon: Unk Corinn Skiff, MD;  Location: [facility] ENDOSCOPY;  Service: Gastroenterology;;   MEDIAL PARTIAL KNEE REPLACEMENT Left 1990   torn ligament. no knee replacement at that time   PARTIAL HYSTERECTOMY     SHOULDER ARTHROSCOPY WITH OPEN ROTATOR CUFF REPAIR Left 03/01/2016   Procedure: SHOULDER  ARTHROSCOPY WITH OPEN ROTATOR CUFF REPAIR;  Surgeon: Franky Cranker, MD;  Location: [facility] ORS;  Service: Orthopedics;  Laterality: Left;   TOTAL KNEE ARTHROPLASTY Left 02/14/2018   Procedure: TOTAL KNEE ARTHROPLASTY;  Surgeon: Cranker Franky, MD;  Location: [facility] ORS;  Service: Orthopedics;  Laterality: Left;    FAMILY HISTORY: Family History  Problem Relation Age of Onset   CAD Mother    CAD Sister    Breast cancer Neg Hx     ADVANCED DIRECTIVES (Y/N):  N  HEALTH MAINTENANCE: Social History   Tobacco Use   Smoking status: Some Days    Current packs/day: 0.00    Average packs/day: 0.3 packs/day for 50.0 years (12.5 ttl pk-yrs)    Types: Cigarettes    Start date: 03/13/1966    Last attempt to quit: 03/13/2016    Years since quitting: 8.2   Smokeless tobacco: Never  Vaping Use   Vaping status: Never Used  Substance Use Topics   Alcohol use: No   Drug use: No     Colonoscopy:  PAP:  Bone density:  Lipid panel:  Allergies  Allergen Reactions   Latex Rash    Welts over body    Current Outpatient Medications  Medication Sig Dispense Refill   albuterol  (VENTOLIN  HFA) 108 (90 Base) MCG/ACT inhaler Inhale 2 puffs into the lungs every 6 (six) hours as needed for wheezing.     amitriptyline  (ELAVIL ) 25 MG tablet Take 25 mg by mouth at bedtime.     amitriptyline  (  ELAVIL ) 50 MG tablet Take 50 mg by mouth at bedtime.     budesonide-formoterol  (SYMBICORT) 160-4.5 MCG/ACT inhaler Inhale 2 puffs into the lungs 2 (two) times daily.     escitalopram (LEXAPRO) 5 MG tablet Take 5 mg by mouth daily.     folic acid  (FOLVITE ) 1 MG tablet Take 1 mg by mouth daily.     furosemide  (LASIX ) 20 MG tablet Take 20 mg daily by mouth.      lisinopril  (PRINIVIL ,ZESTRIL ) 20 MG tablet Take 20 mg by mouth daily.     omeprazole  (PRILOSEC ) 40 MG capsule Take 1 capsule (40 mg total) by mouth daily. 90 capsule 3   potassium chloride  (KLOR-CON  M) 10 MEQ tablet Take 10 mEq by mouth daily.     rOPINIRole   (REQUIP ) 2 MG tablet Take 2-4 mg by mouth at bedtime as needed (restless leg syndrome).     simvastatin  (ZOCOR ) 20 MG tablet Take 20 mg by mouth at bedtime.     SPIRIVA  HANDIHALER 18 MCG inhalation capsule Place 18 mcg into inhaler and inhale daily.     SUMAtriptan  6 MG/0.5ML SOAJ Inject into the skin.     vitamin B-12 (CYANOCOBALAMIN ) 1000 MCG tablet Take 1,000 mcg by mouth daily.     No current facility-administered medications for this visit.    OBJECTIVE: Vitals:   06/10/24 1425  BP: (!) 142/86  Pulse: 92  Resp: 17  Temp: 97.6 F (36.4 C)  SpO2: 97%       Body mass index is 26.75 kg/m.    ECOG FS:1 - Symptomatic but completely ambulatory  General: Well-developed, well-nourished, no acute distress. Eyes: Pink conjunctiva, anicteric sclera. HEENT: Normocephalic, moist mucous membranes. Lungs: No audible wheezing or coughing. Heart: Regular rate and rhythm. Abdomen: Soft, nontender, no obvious distention. Musculoskeletal: No edema, cyanosis, or clubbing. Neuro: Alert, answering all questions appropriately. Cranial nerves grossly intact. Skin: No rashes or petechiae noted. Psych: Normal affect.  LAB RESULTS:  Lab Results  Component Value Date   NA 144 08/10/2023   K 3.7 08/10/2023   CL 109 08/10/2023   CO2 29 08/10/2023   GLUCOSE 104 (H) 08/10/2023   BUN 17 08/10/2023   CREATININE 0.68 08/10/2023   CALCIUM 8.2 (L) 08/10/2023   PROT 5.7 (L) 06/13/2023   ALBUMIN 3.3 (L) 06/13/2023   AST 17 06/13/2023   ALT 13 06/13/2023   ALKPHOS 28 (L) 06/13/2023   BILITOT 0.4 06/13/2023   GFRNONAA >60 08/10/2023   GFRAA >60 02/16/2018    Lab Results  Component Value Date   WBC 5.1 06/10/2024   NEUTROABS 3.4 06/10/2024   HGB 9.3 (L) 06/10/2024   HCT 30.6 (L) 06/10/2024   MCV 71.8 (L) 06/10/2024   PLT 299 06/10/2024   Lab Results  Component Value Date   IRON  26 (L) 12/19/2023   TIBC 363 12/19/2023   IRONPCTSAT 7 (L) 12/19/2023   Lab Results  Component Value Date    FERRITIN 7 (L) 12/19/2023     STUDIES: No results found.    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  Hemoglobin improved to 9.3.  She does not require transfusion tomorrow. She continues to only require transfusion approximately every 4-8 weeks.  Proceed with octreotide  today.  Return to clinic in 4 weeks for further evaluation and continuation of treatment. History  of AVMs: EGD and octreotide  as above.  Continue follow-up with GI as scheduled.   Tendon repair: Improved.  I spent a total of 20 minutes reviewing chart data, face-to-face evaluation with the patient, counseling and coordination of care as detailed above.    Patient expressed understanding and was in agreement with this plan. She also understands that She can call clinic at any time with any questions, concerns, or complaints.    Evalene JINNY Reusing, MD   06/10/2024 3:25 PM     "

## 2024-06-11 ENCOUNTER — Inpatient Hospital Stay

## 2024-07-09 ENCOUNTER — Inpatient Hospital Stay

## 2024-07-09 ENCOUNTER — Inpatient Hospital Stay: Attending: Oncology

## 2024-07-09 ENCOUNTER — Encounter: Payer: Self-pay | Admitting: Oncology

## 2024-07-09 ENCOUNTER — Inpatient Hospital Stay: Admitting: Oncology

## 2024-07-09 VITALS — BP 163/79 | HR 74 | Temp 96.6°F | Ht 63.0 in | Wt 146.0 lb

## 2024-07-09 DIAGNOSIS — D509 Iron deficiency anemia, unspecified: Secondary | ICD-10-CM | POA: Diagnosis not present

## 2024-07-09 DIAGNOSIS — D649 Anemia, unspecified: Secondary | ICD-10-CM

## 2024-07-09 LAB — CBC WITH DIFFERENTIAL/PLATELET
Abs Immature Granulocytes: 0.01 10*3/uL (ref 0.00–0.07)
Basophils Absolute: 0 10*3/uL (ref 0.0–0.1)
Basophils Relative: 1 %
Eosinophils Absolute: 0 10*3/uL (ref 0.0–0.5)
Eosinophils Relative: 1 %
HCT: 28.7 % — ABNORMAL LOW (ref 36.0–46.0)
Hemoglobin: 8.9 g/dL — ABNORMAL LOW (ref 12.0–15.0)
Immature Granulocytes: 0 %
Lymphocytes Relative: 33 %
Lymphs Abs: 1.3 10*3/uL (ref 0.7–4.0)
MCH: 21.7 pg — ABNORMAL LOW (ref 26.0–34.0)
MCHC: 31 g/dL (ref 30.0–36.0)
MCV: 70 fL — ABNORMAL LOW (ref 80.0–100.0)
Monocytes Absolute: 0.5 10*3/uL (ref 0.1–1.0)
Monocytes Relative: 12 %
Neutro Abs: 2.1 10*3/uL (ref 1.7–7.7)
Neutrophils Relative %: 53 %
Platelets: 331 10*3/uL (ref 150–400)
RBC: 4.1 MIL/uL (ref 3.87–5.11)
RDW: 19.7 % — ABNORMAL HIGH (ref 11.5–15.5)
WBC: 3.9 10*3/uL — ABNORMAL LOW (ref 4.0–10.5)
nRBC: 0 % (ref 0.0–0.2)

## 2024-07-09 LAB — SAMPLE TO BLOOD BANK

## 2024-07-09 MED ORDER — OCTREOTIDE ACETATE 30 MG IM KIT
30.0000 mg | PACK | Freq: Once | INTRAMUSCULAR | Status: AC
  Administered 2024-07-09: 30 mg via INTRAMUSCULAR
  Filled 2024-07-09: qty 1

## 2024-07-09 NOTE — Progress Notes (Signed)
 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  INTERVAL HISTORY: Patient returns to clinic today for repeat laboratory work, further evaluation, and continuation of octreotide .  She has chronic fatigue, but otherwise feels well.  She continues to deny any melena or hematochezia.  She has no neurologic complaints.  She denies any recent fevers or illnesses.  She has a fair appetite.  She denies any chest pain, shortness of breath, cough, or hemoptysis.  She denies any nausea, vomiting, constipation, or diarrhea.  She has no urinary complaints.  Patient offers no further specific complaints today.  REVIEW OF SYSTEMS:   Review of Systems  Constitutional:  Positive for malaise/fatigue. Negative for fever and weight loss.  Respiratory: Negative.  Negative for cough and shortness of breath.   Cardiovascular: Negative.  Negative for chest pain and leg swelling.  Gastrointestinal: Negative.  Negative for abdominal pain, blood in stool and melena.  Genitourinary: Negative.  Negative for hematuria.  Musculoskeletal: Negative.  Negative for back pain.  Skin: Negative.  Negative for rash.  Neurological: Negative.  Negative for dizziness, focal weakness, weakness and headaches.  Psychiatric/Behavioral: Negative.  The patient is not nervous/anxious.     As per HPI. Otherwise, a complete review of systems is negative.  PAST MEDICAL HISTORY: Past Medical History:  Diagnosis Date   Anemia    Arthritis    Breast cancer (HCC) 1990   Right   COPD (chronic obstructive pulmonary disease) (HCC)    Dyspnea    DOE   Elevated lipids    Hypertension    Lower extremity edema    Migraines    Migraines    Personal  history of radiation therapy 1990   Breast   Restless leg syndrome    Rotator cuff injury     PAST SURGICAL HISTORY: Past Surgical History:  Procedure Laterality Date   ABDOMINAL HYSTERECTOMY     BACK SURGERY  08/2017   CERVICAL FUSION   BREAST BIOPSY Right 1990   LUMPECTOMY.  took lymph nodes   BREAST LUMPECTOMY     COLONOSCOPY WITH PROPOFOL  N/A 07/20/2017   Procedure: COLONOSCOPY WITH PROPOFOL ;  Surgeon: Janalyn Keene NOVAK, MD;  Location: [facility] ENDOSCOPY;  Service: Endoscopy;  Laterality: N/A;   COLONOSCOPY WITH PROPOFOL  N/A 01/27/2022   Procedure: COLONOSCOPY WITH PROPOFOL ;  Surgeon: Therisa Bi, MD;  Location: [facility] ENDOSCOPY;  Service: Gastroenterology;  Laterality: N/A;   ENTEROSCOPY N/A 08/08/2017   Procedure: ENTEROSCOPY;  Surgeon: Unk Corinn Skiff, MD;  Location: [facility] ENDOSCOPY;  Service: Gastroenterology;  Laterality: N/A;   ENTEROSCOPY N/A 05/07/2020   Procedure: ENTEROSCOPY with Adult colonoscope;  Surgeon: Janalyn Keene NOVAK, MD;  Location: [facility] ENDOSCOPY;  Service: Endoscopy;  Laterality: N/A;   ENTEROSCOPY N/A 03/17/2021   Procedure: ENTEROSCOPY;  Surgeon: Janalyn Keene NOVAK, MD;  Location: [facility] ENDOSCOPY;  Service: Endoscopy;  Laterality: N/A;  PUSH   ENTEROSCOPY N/A 07/13/2021   Procedure: ENTEROSCOPY;  Surgeon: Unk Corinn Skiff, MD;  Location: [facility] ENDOSCOPY;  Service: Gastroenterology;  Laterality: N/A;   ENTEROSCOPY N/A 10/26/2021   Procedure: ENTEROSCOPY;  Surgeon: Unk Corinn Skiff, MD;  Location: [facility] ENDOSCOPY;  Service: Gastroenterology;  Laterality: N/A;  push   ENTEROSCOPY N/A 07/27/2022   Procedure: ENTEROSCOPY;  Surgeon:  Jinny Carmine, MD;  Location: [facility] ENDOSCOPY;  Service: Endoscopy;  Laterality: N/A;  Push   ENTEROSCOPY N/A 06/29/2023   Procedure: ENTEROSCOPY;  Surgeon: Therisa Bi, MD;  Location: [facility] ENDOSCOPY;  Service: Gastroenterology;  Laterality: N/A;  Push   ENTEROSCOPY N/A 08/10/2023   Procedure: ENTEROSCOPY;  Surgeon: Unk Corinn Skiff, MD;  Location: [facility] ENDOSCOPY;  Service: Gastroenterology;  Laterality: N/A;  Push   ESOPHAGOGASTRODUODENOSCOPY Left 04/13/2017   Procedure: ESOPHAGOGASTRODUODENOSCOPY (EGD);  Surgeon: Janalyn Keene NOVAK, MD;  Location: [facility] ENDOSCOPY;  Service: Endoscopy;  Laterality: Left;   ESOPHAGOGASTRODUODENOSCOPY (EGD) WITH PROPOFOL  N/A 07/20/2017   Procedure: ESOPHAGOGASTRODUODENOSCOPY (EGD) WITH PROPOFOL ;  Surgeon: Janalyn Keene NOVAK, MD;  Location: [facility] ENDOSCOPY;  Service: Endoscopy;  Laterality: N/A;   ESOPHAGOGASTRODUODENOSCOPY (EGD) WITH PROPOFOL  N/A 12/12/2017   Procedure: ESOPHAGOGASTRODUODENOSCOPY (EGD) WITH PROPOFOL ;  Surgeon: Janalyn Keene NOVAK, MD;  Location: [facility] ENDOSCOPY;  Service: Endoscopy;  Laterality: N/A;   ESOPHAGOGASTRODUODENOSCOPY (EGD) WITH PROPOFOL  N/A 12/23/2020   Procedure: ESOPHAGOGASTRODUODENOSCOPY (EGD) WITH PROPOFOL ;  Surgeon: Janalyn Keene NOVAK, MD;  Location: [facility] ENDOSCOPY;  Service: Endoscopy;  Laterality: N/A;   ESOPHAGOGASTRODUODENOSCOPY (EGD) WITH PROPOFOL  N/A 01/27/2022   Procedure: ESOPHAGOGASTRODUODENOSCOPY (EGD) WITH PROPOFOL ;  Surgeon: Therisa Bi, MD;  Location: [facility] ENDOSCOPY;  Service: Gastroenterology;  Laterality: N/A;   GIVENS CAPSULE STUDY N/A 03/10/2021   Procedure: GIVENS CAPSULE STUDY;  Surgeon: Janalyn Keene NOVAK, MD;  Location: [facility] ENDOSCOPY;  Service: Endoscopy;  Laterality: N/A;   HOT HEMOSTASIS  06/29/2023   Procedure: HOT HEMOSTASIS (ARGON PLASMA COAGULATION/BICAP);  Surgeon: Therisa Bi, MD;  Location: [facility] ENDOSCOPY;  Service: Gastroenterology;;   HOT HEMOSTASIS  08/10/2023   Procedure: EGD, WITH ARGON PLASMA COAGULATION;  Surgeon: Unk Corinn Skiff, MD;  Location: [facility] ENDOSCOPY;  Service: Gastroenterology;;   MEDIAL PARTIAL KNEE REPLACEMENT Left 1990   torn ligament. no knee replacement at that time   PARTIAL HYSTERECTOMY     SHOULDER ARTHROSCOPY WITH OPEN ROTATOR CUFF REPAIR Left 03/01/2016   Procedure: SHOULDER ARTHROSCOPY  WITH OPEN ROTATOR CUFF REPAIR;  Surgeon: Franky Cranker, MD;  Location: [facility] ORS;  Service: Orthopedics;  Laterality: Left;   TOTAL KNEE ARTHROPLASTY Left 02/14/2018   Procedure: TOTAL KNEE ARTHROPLASTY;  Surgeon: Cranker Franky, MD;  Location: [facility] ORS;  Service: Orthopedics;  Laterality: Left;    FAMILY HISTORY: Family History  Problem Relation Age of Onset   CAD Mother    CAD Sister    Breast cancer Neg Hx     ADVANCED DIRECTIVES (Y/N):  N  HEALTH MAINTENANCE: Social History   Tobacco Use   Smoking status: Some Days    Current packs/day: 0.00    Average packs/day: 0.3 packs/day for 50.0 years (12.5 ttl pk-yrs)    Types: Cigarettes    Start date: 03/13/1966    Last attempt to quit: 03/13/2016    Years since quitting: 8.3   Smokeless tobacco: Never  Vaping Use   Vaping status: Never Used  Substance Use Topics   Alcohol use: No   Drug use: No     Colonoscopy:  PAP:  Bone density:  Lipid panel:  Allergies  Allergen Reactions   Latex Rash    Welts over body    Current Outpatient Medications  Medication Sig Dispense Refill   albuterol  (VENTOLIN  HFA) 108 (90 Base) MCG/ACT inhaler Inhale 2 puffs into the lungs every 6 (six) hours as needed for wheezing.     amitriptyline  (ELAVIL ) 25 MG tablet Take 25 mg by mouth at bedtime.     amitriptyline  (ELAVIL )  50 MG tablet Take 50 mg by mouth at bedtime.     budesonide-formoterol  (SYMBICORT) 160-4.5 MCG/ACT inhaler Inhale 2 puffs into the lungs 2 (two) times daily.     escitalopram (LEXAPRO) 5 MG tablet Take 5 mg by mouth daily.     folic acid  (FOLVITE ) 1 MG tablet Take 1 mg by mouth daily.     furosemide  (LASIX ) 20 MG tablet Take 20 mg daily by mouth.      lisinopril  (PRINIVIL ,ZESTRIL ) 20 MG tablet Take 20 mg by mouth daily.     omeprazole  (PRILOSEC ) 40 MG capsule Take 1 capsule (40 mg total) by mouth daily. 90 capsule 3   potassium chloride  (KLOR-CON  M) 10 MEQ tablet Take 10 mEq by mouth daily.     rOPINIRole  (REQUIP ) 2  MG tablet Take 2-4 mg by mouth at bedtime as needed (restless leg syndrome).     simvastatin  (ZOCOR ) 20 MG tablet Take 20 mg by mouth at bedtime.     SPIRIVA  HANDIHALER 18 MCG inhalation capsule Place 18 mcg into inhaler and inhale daily.     SUMAtriptan  6 MG/0.5ML SOAJ Inject into the skin.     vitamin B-12 (CYANOCOBALAMIN ) 1000 MCG tablet Take 1,000 mcg by mouth daily.     No current facility-administered medications for this visit.   Facility-Administered Medications Ordered in Other Visits  Medication Dose Route Frequency Provider Last Rate Last Admin   octreotide  (SANDOSTATIN  LAR) IM injection 30 mg  30 mg Intramuscular Once Marijean Montanye J, MD        OBJECTIVE: Vitals:   07/09/24 0955 07/09/24 1048  BP: (!) 160/96 (!) 163/79  Pulse: 74   Temp: (!) 96.6 F (35.9 C)   SpO2: 100%        Body mass index is 25.86 kg/m.    ECOG FS:1 - Symptomatic but completely ambulatory  General: Well-developed, well-nourished, no acute distress. Eyes: Pink conjunctiva, anicteric sclera. HEENT: Normocephalic, moist mucous membranes. Lungs: No audible wheezing or coughing. Heart: Regular rate and rhythm. Abdomen: Soft, nontender, no obvious distention. Musculoskeletal: No edema, cyanosis, or clubbing. Neuro: Alert, answering all questions appropriately. Cranial nerves grossly intact. Skin: No rashes or petechiae noted. Psych: Normal affect.  LAB RESULTS:  Lab Results  Component Value Date   NA 144 08/10/2023   K 3.7 08/10/2023   CL 109 08/10/2023   CO2 29 08/10/2023   GLUCOSE 104 (H) 08/10/2023   BUN 17 08/10/2023   CREATININE 0.68 08/10/2023   CALCIUM 8.2 (L) 08/10/2023   PROT 5.7 (L) 06/13/2023   ALBUMIN 3.3 (L) 06/13/2023   AST 17 06/13/2023   ALT 13 06/13/2023   ALKPHOS 28 (L) 06/13/2023   BILITOT 0.4 06/13/2023   GFRNONAA >60 08/10/2023   GFRAA >60 02/16/2018    Lab Results  Component Value Date   WBC 3.9 (L) 07/09/2024   NEUTROABS 2.1 07/09/2024   HGB 8.9 (L)  07/09/2024   HCT 28.7 (L) 07/09/2024   MCV 70.0 (L) 07/09/2024   PLT 331 07/09/2024   Lab Results  Component Value Date   IRON  26 (L) 12/19/2023   TIBC 363 12/19/2023   IRONPCTSAT 7 (L) 12/19/2023   Lab Results  Component Value Date   FERRITIN 7 (L) 12/19/2023     STUDIES: No results found.    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  Hemoglobin has declined slightly to 8.9, but she does not require transfusion tomorrow.  She continues to only require transfusion approximately every 4-8 weeks.  Proceed with octreotide  today.  Return to clinic in 4 weeks for repeat laboratory, further evaluation, and continuation of treatment.   History of AVMs: EGD and octreotide  as above.  Continue follow-up with GI as scheduled.   Tendon repair: Improved.  I spent a total of 30 minutes reviewing chart data, face-to-face evaluation with the patient, counseling and coordination of care as detailed above.     Patient expressed understanding and was in agreement with this plan. She also understands that She can call clinic at any time with any questions, concerns, or complaints.    Hailey JINNY Reusing, MD   07/09/2024 10:49 AM     "

## 2024-07-09 NOTE — Progress Notes (Signed)
Patient states no concerns today.

## 2024-07-10 ENCOUNTER — Inpatient Hospital Stay

## 2024-08-05 ENCOUNTER — Inpatient Hospital Stay

## 2024-08-05 ENCOUNTER — Inpatient Hospital Stay: Admitting: Oncology

## 2024-08-06 ENCOUNTER — Inpatient Hospital Stay
